# Patient Record
Sex: Female | Born: 1945
Health system: Southern US, Community
[De-identification: ages and names within clinical notes are randomized; demographics above are authoritative.]

## PROBLEM LIST (undated history)

## (undated) DIAGNOSIS — C801 Malignant (primary) neoplasm, unspecified: Secondary | ICD-10-CM

## (undated) DIAGNOSIS — K219 Gastro-esophageal reflux disease without esophagitis: Secondary | ICD-10-CM

## (undated) DIAGNOSIS — Z6379 Other stressful life events affecting family and household: Secondary | ICD-10-CM

## (undated) DIAGNOSIS — T7840XA Allergy, unspecified, initial encounter: Secondary | ICD-10-CM

## (undated) DIAGNOSIS — K635 Polyp of colon: Secondary | ICD-10-CM

## (undated) DIAGNOSIS — M8580 Other specified disorders of bone density and structure, unspecified site: Secondary | ICD-10-CM

## (undated) DIAGNOSIS — J45909 Unspecified asthma, uncomplicated: Secondary | ICD-10-CM

## (undated) DIAGNOSIS — E785 Hyperlipidemia, unspecified: Secondary | ICD-10-CM

## (undated) DIAGNOSIS — M1990 Unspecified osteoarthritis, unspecified site: Secondary | ICD-10-CM

## (undated) HISTORY — PX: MOHS SURGERY: SUR867

## (undated) HISTORY — DX: Unspecified asthma, uncomplicated: J45.909

## (undated) HISTORY — DX: Polyp of colon: K63.5

## (undated) HISTORY — DX: Unspecified osteoarthritis, unspecified site: M19.90

## (undated) HISTORY — DX: Other specified disorders of bone density and structure, unspecified site: M85.80

## (undated) HISTORY — DX: Gastro-esophageal reflux disease without esophagitis: K21.9

## (undated) HISTORY — DX: Malignant (primary) neoplasm, unspecified: C80.1

## (undated) HISTORY — PX: VAGINAL HYSTERECTOMY: SUR661

## (undated) HISTORY — DX: Hyperlipidemia, unspecified: E78.5

## (undated) HISTORY — PX: COLONOSCOPY: SHX174

## (undated) HISTORY — PX: BREAST BIOPSY: SHX20

## (undated) HISTORY — DX: Allergy, unspecified, initial encounter: T78.40XA

## (undated) HISTORY — PX: TONSILLECTOMY: SUR1361

## (undated) MED FILL — Fosaprepitant Dimeglumine For IV Infusion 150 MG (Base Eq): INTRAVENOUS | Qty: 5 | Status: AC

---
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
## 2004-03-04 ENCOUNTER — Ambulatory Visit: Payer: Self-pay | Admitting: Family Medicine

## 2005-03-24 ENCOUNTER — Ambulatory Visit: Payer: Self-pay | Admitting: Family Medicine

## 2005-04-05 ENCOUNTER — Ambulatory Visit: Payer: Self-pay | Admitting: Family Medicine

## 2006-02-01 ENCOUNTER — Ambulatory Visit: Payer: Self-pay | Admitting: Internal Medicine

## 2006-02-27 ENCOUNTER — Ambulatory Visit: Payer: Self-pay | Admitting: Internal Medicine

## 2006-05-08 ENCOUNTER — Ambulatory Visit: Payer: Self-pay

## 2006-07-13 ENCOUNTER — Ambulatory Visit: Payer: Self-pay | Admitting: Internal Medicine

## 2006-07-27 ENCOUNTER — Encounter: Payer: Self-pay | Admitting: Internal Medicine

## 2006-07-27 ENCOUNTER — Ambulatory Visit: Payer: Self-pay | Admitting: Internal Medicine

## 2006-11-08 DIAGNOSIS — D229 Melanocytic nevi, unspecified: Secondary | ICD-10-CM

## 2006-11-08 HISTORY — DX: Melanocytic nevi, unspecified: D22.9

## 2007-05-09 ENCOUNTER — Ambulatory Visit: Payer: Self-pay | Admitting: Family Medicine

## 2007-05-13 ENCOUNTER — Ambulatory Visit: Payer: Self-pay | Admitting: Family Medicine

## 2008-02-03 ENCOUNTER — Ambulatory Visit: Payer: Self-pay | Admitting: General Surgery

## 2009-02-03 ENCOUNTER — Ambulatory Visit: Payer: Self-pay | Admitting: Family Medicine

## 2009-10-13 ENCOUNTER — Ambulatory Visit: Payer: Self-pay | Admitting: Family Medicine

## 2010-02-04 ENCOUNTER — Ambulatory Visit: Payer: Self-pay | Admitting: Family Medicine

## 2010-07-01 NOTE — Assessment & Plan Note (Signed)
[facility]                         GASTROENTEROLOGY OFFICE NOTE   DONNALYNN, [name]                         MRN:          [id number]  DATE:02/27/2006                            DOB:          08/15/1945    [name] is a very nice 65 year old patient of Dr. Thana Ates who is  here today to discuss colorectal screening.  She had a colonoscopy  approximately 5 years ago by Dr. Thurmond Butts in [place], and was found to  have polyps.  The records are with Dr. Thana Ates.  Dr. Thurmond Butts has left.  She was not told when she needs to come back for recall colonoscopy.  She has chronic constipation, taking stool softeners on a daily basis.  She is having 1 bowel movement daily.  There has been no rectal  bleeding.  There is a family history of colon polyps in both her father  and mother, and her father's brother had colon cancer.  The patient has  a complaint of occasional heartburn and gastroesophageal reflux.   MEDICATIONS:  1. TriCor  145 mg daily.  2. Fosamax 70 mg p.o. daily.  3. Zolpidem 10 mg p.o. daily.  4. Aspirin 81 mg p.o. daily.  5. Calcium supplements.  6. Multiple vitamins.  7. Vitamin C.  8. Omega 3.  9. Glucosamine.   PAST MEDICAL HISTORY:  Significant for asthmatic bronchitis, chronic  headaches.   OPERATIONS:  Hysterectomy in 1983.   FAMILY HISTORY:  Positive for colon polyps.   SOCIAL HISTORY:  Married with 2 children, college education.  Works as a  Investment banker, corporate.  The patient does not smoke, does not drink alcohol.   REVIEW OF SYSTEMS:  Positive for sleeping problems.   PHYSICAL EXAMINATION:  Blood pressure 122/82, pulse 64, weight 176  pounds.  She was alert, oriented, in no distress.  HEENT:  Sclerae is nonicteric.  NECK:  Supple, no lymphadenopathy.  LUNGS:  Clear to auscultation.  COR:  Normal S1, normal S2.  ABDOMEN:  Soft, nontender with normoactive bowel sounds, no scars.  Liver edge at the costal margin.  RECTAL:  Normal  perianal area, rectal tone was normal, stool was brown,  Hemoccult-negative.   IMPRESSION:  [age]-year-old white female with personal history of colon  polyps and family history of colon polyps as well.  She has chronic  constipation and she is due for colonoscopy.  This is what decided about  the recall interval for her colonoscopy, which was done 5 years ago.  We  will obtain records from Dr. Thana Ates from Dr. Warden Fillers colonoscopy.  If  she had hyperplastic polyps she could wait 7 years for recall.  If she  had adenomatous polyps, then it is time for her to have a colonoscopy  now.  Since her husband is an Airline pilot she would like to wait until  after tax season to have her colonoscopy done.  She would prefer to have  the OsmoPrep rather than Miralax.  We have spent some time talking about  colonoscopy, conscious sedation, the procedure itself and the prep.  We  will schedule after reviewing her  old colonoscopy report from Dr.  Thana Ates.     Hedwig Morton. Juanda Chance, MD  Electronically Signed    DMB/MedQ  DD: 02/27/2006  DT: 02/28/2006  Job #: [id number]   cc:   Dennison Mascot

## 2011-09-29 ENCOUNTER — Ambulatory Visit: Payer: Self-pay | Admitting: Family Medicine

## 2011-11-16 ENCOUNTER — Encounter: Payer: Self-pay | Admitting: Internal Medicine

## 2011-11-28 ENCOUNTER — Encounter: Payer: Self-pay | Admitting: Internal Medicine

## 2012-08-27 DIAGNOSIS — C4491 Basal cell carcinoma of skin, unspecified: Secondary | ICD-10-CM

## 2012-08-27 HISTORY — DX: Basal cell carcinoma of skin, unspecified: C44.91

## 2012-12-10 ENCOUNTER — Ambulatory Visit: Payer: Self-pay | Admitting: Family Medicine

## 2012-12-13 ENCOUNTER — Ambulatory Visit: Payer: Self-pay | Admitting: Family Medicine

## 2013-05-07 ENCOUNTER — Encounter: Payer: Self-pay | Admitting: Internal Medicine

## 2013-06-03 ENCOUNTER — Ambulatory Visit: Payer: Self-pay | Admitting: Family Medicine

## 2013-07-02 ENCOUNTER — Encounter: Payer: Self-pay | Admitting: Internal Medicine

## 2013-08-13 ENCOUNTER — Ambulatory Visit (AMBULATORY_SURGERY_CENTER): Payer: Commercial Managed Care - HMO | Admitting: *Deleted

## 2013-08-13 VITALS — Ht 64.5 in | Wt 181.2 lb

## 2013-08-13 DIAGNOSIS — Z86010 Personal history of colonic polyps: Secondary | ICD-10-CM

## 2013-08-13 MED ORDER — MOVIPREP 100 G PO SOLR: ORAL | Status: DC

## 2013-08-13 NOTE — Progress Notes (Signed)
No egg or soy allergy  No anesthesia or intubation problems per pt  No diet medications taken  Registered in EMMI   

## 2013-08-14 ENCOUNTER — Encounter: Payer: Self-pay | Admitting: Internal Medicine

## 2013-08-27 ENCOUNTER — Ambulatory Visit (AMBULATORY_SURGERY_CENTER): Payer: Commercial Managed Care - HMO | Admitting: Internal Medicine

## 2013-08-27 ENCOUNTER — Encounter: Payer: Self-pay | Admitting: Internal Medicine

## 2013-08-27 VITALS — BP 138/74 | HR 49 | Temp 98.1°F | Resp 18 | Ht 64.5 in | Wt 181.0 lb

## 2013-08-27 DIAGNOSIS — Z86010 Personal history of colonic polyps: Secondary | ICD-10-CM

## 2013-08-27 DIAGNOSIS — Z8371 Family history of colonic polyps: Secondary | ICD-10-CM

## 2013-08-27 DIAGNOSIS — Z1211 Encounter for screening for malignant neoplasm of colon: Secondary | ICD-10-CM

## 2013-08-27 DIAGNOSIS — D126 Benign neoplasm of colon, unspecified: Secondary | ICD-10-CM

## 2013-08-27 MED ORDER — SODIUM CHLORIDE 0.9 % IV SOLN: 500.0000 mL | INTRAVENOUS | Status: DC

## 2013-08-27 NOTE — Progress Notes (Signed)
Called to room to assist during endoscopic procedure.  Patient ID and intended procedure confirmed with present staff. Received instructions for my participation in the procedure from the performing physician.  

## 2013-08-27 NOTE — Patient Instructions (Signed)
YOU HAD AN ENDOSCOPIC PROCEDURE TODAY AT THE [facility]: Refer to the procedure report that was given to you for any specific questions about what was found during the examination.  If the procedure report does not answer your questions, please call your gastroenterologist to clarify.  If you requested that your care partner not be given the details of your procedure findings, then the procedure report has been included in a sealed envelope for you to review at your convenience later.  YOU SHOULD EXPECT: Some feelings of bloating in the abdomen. Passage of more gas than usual.  Walking can help get rid of the air that was put into your GI tract during the procedure and reduce the bloating. If you had a lower endoscopy (such as a colonoscopy or flexible sigmoidoscopy) you may notice spotting of blood in your stool or on the toilet paper. If you underwent a bowel prep for your procedure, then you may not have a normal bowel movement for a few days.  DIET: Your first meal following the procedure should be a light meal and then it is ok to progress to your normal diet.  A half-sandwich or bowl of soup is an example of a good first meal.  Heavy or fried foods are harder to digest and may make you feel nauseous or bloated.  Likewise meals heavy in dairy and vegetables can cause extra gas to form and this can also increase the bloating.  Drink plenty of fluids but you should avoid alcoholic beverages for 24 hours.  ACTIVITY: Your care partner should take you home directly after the procedure.  You should plan to take it easy, moving slowly for the rest of the day.  You can resume normal activity the day after the procedure however you should NOT DRIVE or use heavy machinery for 24 hours (because of the sedation medicines used during the test).    SYMPTOMS TO REPORT IMMEDIATELY: A gastroenterologist can be reached at any hour.  During normal business hours, 8:30 AM to 5:00 PM Monday through Friday,  call [phone].  After hours and on weekends, please call the GI answering service at [phone] who will take a message and have the physician on call contact you.   Following lower endoscopy (colonoscopy or flexible sigmoidoscopy):  Excessive amounts of blood in the stool  Significant tenderness or worsening of abdominal pains  Swelling of the abdomen that is new, acute  Fever of 100F or higher  FOLLOW UP: If any biopsies were taken you will be contacted by phone or by letter within the next 1-3 weeks.  Call your gastroenterologist if you have not heard about the biopsies in 3 weeks.  Our staff will call the home number listed on your records the next business day following your procedure to check on you and address any questions or concerns that you may have at that time regarding the information given to you following your procedure. This is a courtesy call and so if there is no answer at the home number and we have not heard from you through the emergency physician on call, we will assume that you have returned to your regular daily activities without incident.  SIGNATURES/CONFIDENTIALITY: You and/or your care partner have signed paperwork which will be entered into your electronic medical record.  These signatures attest to the fact that that the information above on your After Visit Summary has been reviewed and is understood.  Full responsibility of the confidentiality of this   discharge information lies with you and/or your care-partner.  Polyps-handout given  Repeat colonoscopy will be determined by pathology.  Hold aspirin, aspirin products and anti-inflammatory medications (ibuprofen, motrin, advil, naproxen, aleve,etc) for two weeks. Until 09/10/13

## 2013-08-27 NOTE — Progress Notes (Signed)
A/ox3, pleased with MAC, report to RN 

## 2013-08-27 NOTE — Op Note (Signed)
[facility]  [address]. [place], [address]   COLONOSCOPY PROCEDURE REPORT  PATIENT: Elizabeth, [name]  MR#: [id number] BIRTHDATE: Nov 04, 1945 , 34  yrs. old GENDER: Female ENDOSCOPIST: Lafayette Dragon, MD REFERRED HU:DJSHFW Rutherford Nail, M.D. PROCEDURE DATE:  08/27/2013 PROCEDURE:   Colonoscopy with snare polypectomy and Colonoscopy with cold biopsy polypectomy First Screening Colonoscopy - Avg.  risk and is 50 yrs.  old or older - No.  Prior Negative Screening - Now for repeat screening. N/A  History of Adenoma - Now for follow-up colonoscopy & has been > or = to 3 yrs.  N/A  Polyps Removed Today? Yes. ASA CLASS:   Class II INDICATIONS:story of colon polyps in 2003,, Hyperplastic polyp removed in June 2008.  Positive family history of colon polyps. MEDICATIONS: MAC sedation, administered by CRNA and propofol (Diprivan) 200mg  IV  DESCRIPTION OF PROCEDURE:   After the risks benefits and alternatives of the procedure were thoroughly explained, informed consent was obtained.  A digital rectal exam revealed no abnormalities of the rectum.   The LB [id number]  endoscope was introduced through the anus and advanced to the cecum, which was identified by both the appendix and ileocecal valve. No adverse events experienced.   The quality of the prep was good, using MoviPrep  The instrument was then slowly withdrawn as the colon was fully examined.      COLON FINDINGS: Three sessile polyps ranging between 3-26mm in size were found in the ascending colon and descending colon.  A polypectomy was performed with cold forceps and with a cold snare. The resection was complete and the polyp tissue was completely retrieved.  Retroflexed views revealed no abnormalities. The time to cecum=12 minutes 44 seconds.  Withdrawal time=6 minutes 07 seconds.  The scope was withdrawn and the procedure completed. COMPLICATIONS: There were no complications.  ENDOSCOPIC  IMPRESSION: Three sessile polyps ranging between 3-64mm in size were found in the ascending colon x1 and descending colon x2 polypectomy was performed with cold forceps x1 and with a cold snare x2  RECOMMENDATIONS: 1.  Await pathology results 2.  no aspirin or anti-inflammatory agents for 2 weeks High-fiber diet Recall colonoscopy pending path report   eSigned:  Lafayette Dragon, MD 08/27/2013 11:34 AM   cc:   PATIENT NAME:  [name], [name] MR#: [id number]

## 2013-08-28 ENCOUNTER — Telehealth: Payer: Self-pay | Admitting: *Deleted

## 2013-08-28 NOTE — Telephone Encounter (Signed)
  Follow up Call-  Call back number 08/27/2013  Post procedure Call Back phone  # [phone]  Permission to leave phone message Yes     Patient questions:  Do you have a fever, pain , or abdominal swelling? No. Pain Score  0 *  Have you tolerated food without any problems? Yes.    Have you been able to return to your normal activities? Yes.    Do you have any questions about your discharge instructions: Diet   No. Medications  No. Follow up visit  No.  Do you have questions or concerns about your Care? No.  Actions: * If pain score is 4 or above: No action needed, pain <4.   Follow up Call-  Call back number 08/27/2013  Post procedure Call Back phone  # [phone]  Permission to leave phone message Yes     Patient questions:  Do you have a fever, pain , or abdominal swelling? No. Pain Score  0 *  Have you tolerated food without any problems? Yes.    Have you been able to return to your normal activities? Yes.    Do you have any questions about your discharge instructions: Diet   No. Medications  No. Follow up visit  No.  Do you have questions or concerns about your Care? No.  Actions: * If pain score is 4 or above: No action needed, pain <4.

## 2013-09-01 ENCOUNTER — Encounter: Payer: Self-pay | Admitting: Internal Medicine

## 2014-01-05 ENCOUNTER — Ambulatory Visit: Payer: Self-pay | Admitting: Family Medicine

## 2014-12-02 ENCOUNTER — Ambulatory Visit: Payer: Self-pay | Admitting: Family Medicine

## 2014-12-09 ENCOUNTER — Ambulatory Visit (INDEPENDENT_AMBULATORY_CARE_PROVIDER_SITE_OTHER): Payer: Commercial Managed Care - HMO | Admitting: Family Medicine

## 2014-12-09 ENCOUNTER — Encounter: Payer: Self-pay | Admitting: Family Medicine

## 2014-12-09 ENCOUNTER — Encounter (INDEPENDENT_AMBULATORY_CARE_PROVIDER_SITE_OTHER): Payer: Self-pay

## 2014-12-09 DIAGNOSIS — G4700 Insomnia, unspecified: Secondary | ICD-10-CM | POA: Diagnosis not present

## 2014-12-09 DIAGNOSIS — R109 Unspecified abdominal pain: Secondary | ICD-10-CM | POA: Diagnosis not present

## 2014-12-09 DIAGNOSIS — F411 Generalized anxiety disorder: Secondary | ICD-10-CM | POA: Insufficient documentation

## 2014-12-09 DIAGNOSIS — E785 Hyperlipidemia, unspecified: Secondary | ICD-10-CM | POA: Diagnosis not present

## 2014-12-09 DIAGNOSIS — F419 Anxiety disorder, unspecified: Secondary | ICD-10-CM | POA: Diagnosis not present

## 2014-12-09 DIAGNOSIS — F5102 Adjustment insomnia: Secondary | ICD-10-CM | POA: Insufficient documentation

## 2014-12-09 DIAGNOSIS — E782 Mixed hyperlipidemia: Secondary | ICD-10-CM | POA: Insufficient documentation

## 2014-12-09 DIAGNOSIS — F430 Acute stress reaction: Secondary | ICD-10-CM

## 2014-12-09 MED ORDER — ONDANSETRON HCL 4 MG PO TABS: 4.0000 mg | ORAL_TABLET | Freq: Three times a day (TID) | ORAL | Status: DC | PRN

## 2014-12-09 MED ORDER — PANTOPRAZOLE SODIUM 40 MG PO TBEC: 40.0000 mg | DELAYED_RELEASE_TABLET | Freq: Every day | ORAL | Status: DC

## 2014-12-09 MED ORDER — ZOLPIDEM TARTRATE 5 MG PO TABS: 5.0000 mg | ORAL_TABLET | Freq: Every evening | ORAL | Status: DC | PRN

## 2014-12-09 MED ORDER — LOVASTATIN 20 MG PO TABS: 20.0000 mg | ORAL_TABLET | Freq: Every day | ORAL | Status: DC

## 2014-12-09 NOTE — Progress Notes (Signed)
Name: [name]   MRN: [id number]    DOB: [birth year]   Date:12/09/2014       Progress Note  Subjective  Chief Complaint  Chief Complaint  Patient presents with  . Hyperlipidemia    6 month follow up  . Insomnia  . Abdominal Pain    on and off for 3 months with nausea    HPI   Hyperlipidemia  Patient has a history of hyperlipidemia for over 5 years.  Current medical regimen consist of lovastatin 20 mg daily at bedtime .  Compliance is good .  Diet and exercise are currently followed moderately well .  Risk factors for cardiovascular disease include hyperlipidemia  .  and age.  There have been no side effects from the medication.    Insomnia history of present illness  Insomnia has been a perennial problem. Ambien 5 mg daily daily at bedtime which are quite effective   Abdominal pain with nausea  For the past 6 months the patient has had abdominal discomfort with nausea without vomiting usually occurs in the morning and has done by lunchtime. There is no particular offending food. There's been no actual emesis or hematemesis and no melena or hematochezia. Colonoscopy was done just a couple 3 years. This been no significant weight loss. This been no icterus and no acholic stools. She still has intact gallbladder  Patient presents with a 3 month history of intermittent abdominal pain and nausea.  Anxiety  Patient has intermittent problem anxiety traveling lorazepam on a when necessary basis with improvement. Symptoms persisted nausea disease) panic attacks and mild-to-moderate resides  Past Medical History  Diagnosis Date  . Allergy   . Arthritis   . Asthma   . Cancer (HCC)     shoulder, nose skin CA- basal cell CA  . GERD (gastroesophageal reflux disease)     occasional  . Hyperlipidemia     Social History  Substance Use Topics  . Smoking status: Former Research scientist (life sciences)  . Smokeless tobacco: Never Used  . Alcohol Use: Yes     Comment: 1 drink monthly     Current  outpatient prescriptions:  .  lovastatin (MEVACOR) 20 MG tablet, Take 1 tablet (20 mg total) by mouth at bedtime., Disp: 90 tablet, Rfl: 1 .  meloxicam (MOBIC) 7.5 MG tablet, Take 7.5 mg by mouth daily., Disp: , Rfl:  .  zolpidem (AMBIEN) 5 MG tablet, Take 1 tablet (5 mg total) by mouth at bedtime as needed for sleep., Disp: 90 tablet, Rfl: 1  Allergies  Allergen Reactions  . Tetanus Toxoids Shortness Of Breath  . Sulfa Antibiotics     Severe vomiting    Review of Systems  Constitutional: Negative for fever, chills and weight loss.  HENT: Negative for congestion, hearing loss, sore throat and tinnitus.   Eyes: Negative for blurred vision, double vision and redness.  Respiratory: Negative for cough, hemoptysis and shortness of breath.   Cardiovascular: Negative for chest pain, palpitations, orthopnea, claudication and leg swelling.  Gastrointestinal: Positive for heartburn, nausea and abdominal pain. Negative for vomiting, diarrhea, constipation and blood in stool.  Genitourinary: Negative for dysuria, urgency, frequency and hematuria.  Musculoskeletal: Negative for myalgias, back pain, joint pain, falls and neck pain.  Skin: Negative for itching.  Neurological: Negative for dizziness, tingling, tremors, focal weakness, seizures, loss of consciousness, weakness and headaches.  Endo/Heme/Allergies: Does not bruise/bleed easily.  Psychiatric/Behavioral: Negative for depression and substance abuse. The patient is nervous/anxious and has insomnia.  Objective  There were no vitals filed for this visit.   Physical Exam  Constitutional: She is oriented to person, place, and time and well-developed, well-nourished, and in no distress.  HENT:  Head: Normocephalic.  Eyes: EOM are normal. Pupils are equal, round, and reactive to light.  Neck: Normal range of motion. No thyromegaly present.  Cardiovascular: Normal rate, regular rhythm and normal heart sounds.   No murmur  heard. Pulmonary/Chest: Effort normal and breath sounds normal.  Abdominal: Soft. Bowel sounds are normal. She exhibits no distension and no mass. There is no tenderness. There is no rebound and no guarding.  Musculoskeletal: Normal range of motion. She exhibits no edema.  Neurological: She is alert and oriented to person, place, and time. No cranial nerve deficit. Gait normal.  Skin: Skin is warm and dry. No rash noted.  Psychiatric: Memory and affect normal.      Assessment & Plan  1. Hyperlipidemia Labs - Comprehensive Metabolic Panel (CMET) - Lipid panel - TSH  2. Abdominal pain, unspecified abdominal location Differential includes gastric or duodenal ulcer or early gallstone disease - US Abdomen Complete; Future  3. Insomnia Ongoing well to Ambien  4. Acute anxiety Benzodiazepine when necessary - Comprehensive Metabolic Panel (CMET)

## 2014-12-12 LAB — LIPID PANEL
CHOL/HDL RATIO: 3.5 ratio (ref 0.0–4.4)
Cholesterol, Total: 181 mg/dL (ref 100–199)
HDL: 51 mg/dL (ref >39)
LDL CALC: 88 mg/dL (ref 0–99)
Triglycerides: 210 mg/dL — ABNORMAL HIGH (ref 0–149)
VLDL Cholesterol Cal: 42 mg/dL — ABNORMAL HIGH (ref 5–40)

## 2014-12-12 LAB — COMPREHENSIVE METABOLIC PANEL
ALT: 14 IU/L (ref 0–32)
AST: 17 IU/L (ref 0–40)
Albumin/Globulin Ratio: 2.2 (ref 1.1–2.5)
Albumin: 4.1 g/dL (ref 3.6–4.8)
Alkaline Phosphatase: 71 IU/L (ref 39–117)
BUN/Creatinine Ratio: 12 (ref 11–26)
BUN: 11 mg/dL (ref 8–27)
Bilirubin Total: 0.4 mg/dL (ref 0.0–1.2)
CO2: 26 mmol/L (ref 18–29)
Calcium: 8.9 mg/dL (ref 8.7–10.3)
Chloride: 101 mmol/L (ref 97–106)
Creatinine, Ser: 0.9 mg/dL (ref 0.57–1.00)
GFR, EST AFRICAN AMERICAN: 75 mL/min/{1.73_m2} (ref >59)
GFR, EST NON AFRICAN AMERICAN: 65 mL/min/{1.73_m2} (ref >59)
GLUCOSE: 101 mg/dL — AB (ref 65–99)
Globulin, Total: 1.9 g/dL (ref 1.5–4.5)
Potassium: 4.3 mmol/L (ref 3.5–5.2)
Sodium: 144 mmol/L (ref 136–144)
TOTAL PROTEIN: 6 g/dL (ref 6.0–8.5)

## 2014-12-12 LAB — TSH: TSH: 1.04 u[IU]/mL (ref 0.450–4.500)

## 2014-12-17 ENCOUNTER — Encounter: Payer: Self-pay | Admitting: Family Medicine

## 2014-12-28 ENCOUNTER — Telehealth: Payer: Self-pay | Admitting: Emergency Medicine

## 2014-12-28 ENCOUNTER — Ambulatory Visit
Admission: RE | Admit: 2014-12-28 | Discharge: 2014-12-28 | Disposition: A | Discharge status: Home / Self Care | Payer: Commercial Managed Care - HMO | Source: Ambulatory Visit | Attending: Family Medicine | Admitting: Family Medicine

## 2014-12-28 DIAGNOSIS — R109 Unspecified abdominal pain: Secondary | ICD-10-CM | POA: Diagnosis present

## 2014-12-28 NOTE — Telephone Encounter (Signed)
Patient notified of labs.   

## 2014-12-28 NOTE — Telephone Encounter (Signed)
Patient notified of US results

## 2015-05-12 DIAGNOSIS — I8393 Asymptomatic varicose veins of bilateral lower extremities: Secondary | ICD-10-CM | POA: Diagnosis not present

## 2015-05-12 DIAGNOSIS — D485 Neoplasm of uncertain behavior of skin: Secondary | ICD-10-CM | POA: Diagnosis not present

## 2015-05-12 DIAGNOSIS — T07 Unspecified multiple injuries: Secondary | ICD-10-CM | POA: Diagnosis not present

## 2015-05-12 DIAGNOSIS — L821 Other seborrheic keratosis: Secondary | ICD-10-CM | POA: Diagnosis not present

## 2015-05-12 DIAGNOSIS — D1800 Hemangioma unspecified site: Secondary | ICD-10-CM | POA: Diagnosis not present

## 2015-05-12 DIAGNOSIS — Z85828 Personal history of other malignant neoplasm of skin: Secondary | ICD-10-CM | POA: Diagnosis not present

## 2015-05-12 DIAGNOSIS — D229 Melanocytic nevi, unspecified: Secondary | ICD-10-CM | POA: Diagnosis not present

## 2015-05-12 DIAGNOSIS — L578 Other skin changes due to chronic exposure to nonionizing radiation: Secondary | ICD-10-CM | POA: Diagnosis not present

## 2015-05-12 DIAGNOSIS — L812 Freckles: Secondary | ICD-10-CM | POA: Diagnosis not present

## 2015-05-12 DIAGNOSIS — Z1283 Encounter for screening for malignant neoplasm of skin: Secondary | ICD-10-CM | POA: Diagnosis not present

## 2015-06-02 ENCOUNTER — Ambulatory Visit (INDEPENDENT_AMBULATORY_CARE_PROVIDER_SITE_OTHER): Payer: PPO | Admitting: Family Medicine

## 2015-06-02 ENCOUNTER — Encounter: Payer: Self-pay | Admitting: Family Medicine

## 2015-06-02 VITALS — BP 118/64 | HR 76 | Temp 97.9°F | Resp 18 | Ht 64.0 in | Wt 175.0 lb

## 2015-06-02 DIAGNOSIS — N3001 Acute cystitis with hematuria: Secondary | ICD-10-CM | POA: Diagnosis not present

## 2015-06-02 MED ORDER — NITROFURANTOIN MONOHYD MACRO 100 MG PO CAPS: 100.0000 mg | ORAL_CAPSULE | Freq: Two times a day (BID) | ORAL | Status: DC

## 2015-06-02 NOTE — Progress Notes (Signed)
Name: [name]   MRN: [id number]    DOB: October 02, 1945   Date:06/02/2015       Progress Note  Subjective  Chief Complaint  Chief Complaint  Patient presents with  . Hematuria    for 1 day  . Urinary Tract Infection    Hematuria This is a new problem. The current episode started yesterday. She describes the hematuria as gross hematuria. She reports clotting at the end of her urine stream. Irritative symptoms include frequency and urgency. Obstructive symptoms include incomplete emptying. Associated symptoms include dysuria. Pertinent negatives include no chills, flank pain or nausea.  Urinary Tract Infection  This is a new problem. The current episode started yesterday. There has been no fever. Associated symptoms include frequency, hematuria and urgency. Pertinent negatives include no chills, flank pain or nausea. She has tried increased fluids (Cranberry Juice.) for the symptoms.     Past Medical History  Diagnosis Date  . Allergy   . Arthritis   . Asthma   . Cancer (HCC)     shoulder, nose skin CA- basal cell CA  . GERD (gastroesophageal reflux disease)     occasional  . Hyperlipidemia     Past Surgical History  Procedure Laterality Date  . Mohs surgery    . Colonoscopy    . Vaginal hysterectomy    . Tonsillectomy      Family History  Problem Relation Age of Onset  . Colon cancer Paternal Uncle   . Rectal cancer Paternal Uncle   . Esophageal cancer Neg Hx   . Stomach cancer Neg Hx     Social History   Social History  . Marital Status: Married    Spouse Name: N/A  . Number of Children: N/A  . Years of Education: N/A   Occupational History  . Not on file.   Social History Main Topics  . Smoking status: Former Research scientist (life sciences)  . Smokeless tobacco: Never Used  . Alcohol Use: Yes     Comment: 1 drink monthly  . Drug Use: No  . Sexual Activity: Not on file   Other Topics Concern  . Not on file   Social History Narrative     Current outpatient  prescriptions:  .  lovastatin (MEVACOR) 20 MG tablet, Take 1 tablet (20 mg total) by mouth at bedtime., Disp: 90 tablet, Rfl: 1 .  meloxicam (MOBIC) 7.5 MG tablet, Take 7.5 mg by mouth daily., Disp: , Rfl:  .  ondansetron (ZOFRAN) 4 MG tablet, Take 1 tablet (4 mg total) by mouth every 8 (eight) hours as needed for nausea or vomiting., Disp: 20 tablet, Rfl: 0 .  pantoprazole (PROTONIX) 40 MG tablet, Take 1 tablet (40 mg total) by mouth daily., Disp: 30 tablet, Rfl: 3 .  zolpidem (AMBIEN) 5 MG tablet, Take 1 tablet (5 mg total) by mouth at bedtime as needed for sleep., Disp: 90 tablet, Rfl: 1  Allergies  Allergen Reactions  . Tetanus Toxoids Shortness Of Breath  . Sulfa Antibiotics     Severe vomiting     Review of Systems  Constitutional: Negative for chills.  Gastrointestinal: Negative for nausea.  Genitourinary: Positive for dysuria, urgency, frequency, hematuria and incomplete emptying. Negative for flank pain.     Objective  Filed Vitals:   06/02/15 1508  BP: 118/64  Pulse: 76  Temp: 97.9 F (36.6 C)  Resp: 18  Height: 5\' 4"  (1.626 m)  Weight: 175 lb (79.379 kg)  SpO2: 98%    Physical Exam  Constitutional: She is well-developed, well-nourished, and in no distress.  Cardiovascular: Normal rate and regular rhythm.   Pulmonary/Chest: Effort normal and breath sounds normal.  Abdominal: Soft. Bowel sounds are normal. There is tenderness in the left lower quadrant. There is CVA tenderness (left CVA Tenderness.).  Nursing note and vitals reviewed.      Assessment & Plan  1. Acute cystitis with hematuria We'll start on Macrobid, obtained urinalysis and culture and adjust accordingly. - Urine Culture - Urinalysis, Routine w reflex microscopic - nitrofurantoin, macrocrystal-monohydrate, (MACROBID) 100 MG capsule; Take 1 capsule (100 mg total) by mouth 2 (two) times daily.  Dispense: 14 capsule; Refill: 0   Keiasia Christianson [facility] 06/02/2015 3:34 PM

## 2015-06-03 DIAGNOSIS — N3001 Acute cystitis with hematuria: Secondary | ICD-10-CM | POA: Diagnosis not present

## 2015-06-04 LAB — MICROSCOPIC EXAMINATION
Casts: NONE SEEN /lpf
WBC, UA: >30 /hpf — AB (ref 0–?)

## 2015-06-04 LAB — URINALYSIS, ROUTINE W REFLEX MICROSCOPIC
BILIRUBIN UA: NEGATIVE
GLUCOSE, UA: NEGATIVE
KETONES UA: NEGATIVE
NITRITE UA: NEGATIVE
SPEC GRAV UA: 1.011 (ref 1.005–1.030)
Urobilinogen, Ur: 0.2 mg/dL (ref 0.2–1.0)
pH, UA: 6.5 (ref 5.0–7.5)

## 2015-06-05 LAB — URINE CULTURE

## 2015-06-08 DIAGNOSIS — H2513 Age-related nuclear cataract, bilateral: Secondary | ICD-10-CM | POA: Diagnosis not present

## 2015-06-10 ENCOUNTER — Ambulatory Visit: Payer: Commercial Managed Care - HMO | Admitting: Family Medicine

## 2015-06-14 ENCOUNTER — Ambulatory Visit: Payer: Commercial Managed Care - HMO | Admitting: Family Medicine

## 2015-07-05 ENCOUNTER — Encounter: Payer: Self-pay | Admitting: Family Medicine

## 2015-07-05 ENCOUNTER — Ambulatory Visit (INDEPENDENT_AMBULATORY_CARE_PROVIDER_SITE_OTHER): Payer: PPO | Admitting: Family Medicine

## 2015-07-05 VITALS — BP 118/69 | HR 77 | Temp 98.0°F | Resp 15 | Ht 64.0 in | Wt 175.1 lb

## 2015-07-05 DIAGNOSIS — E785 Hyperlipidemia, unspecified: Secondary | ICD-10-CM

## 2015-07-05 DIAGNOSIS — F419 Anxiety disorder, unspecified: Secondary | ICD-10-CM | POA: Diagnosis not present

## 2015-07-05 DIAGNOSIS — G4700 Insomnia, unspecified: Secondary | ICD-10-CM

## 2015-07-05 DIAGNOSIS — F430 Acute stress reaction: Secondary | ICD-10-CM

## 2015-07-05 DIAGNOSIS — F411 Generalized anxiety disorder: Secondary | ICD-10-CM

## 2015-07-05 MED ORDER — ZOLPIDEM TARTRATE 5 MG PO TABS: 5.0000 mg | ORAL_TABLET | Freq: Every evening | ORAL | Status: DC | PRN

## 2015-07-05 MED ORDER — LORAZEPAM 0.5 MG PO TABS: 0.5000 mg | ORAL_TABLET | Freq: Two times a day (BID) | ORAL | Status: DC | PRN

## 2015-07-05 NOTE — Progress Notes (Signed)
Name: [name]   MRN: [id number]    DOB: [birth year]   Date:07/05/2015       Progress Note  Subjective  Chief Complaint  Chief Complaint  Patient presents with  . Medication Refill    lorazepam 0.5 mg / lovastatin 20 mg / zolpidem 5 mg     Anxiety Presents for follow-up visit. The problem has been unchanged. Symptoms include excessive worry, insomnia and nervous/anxious behavior (usually from her husband driving their [id number] RV). The severity of symptoms is moderate.   Past treatments include benzodiazephines. The treatment provided significant relief. Compliance with prior treatments has been good.  Insomnia Primary symptoms: difficulty falling asleep.  Typical bedtime:  10-11 P.M. (between [phone]).  How long after going to bed to you fall asleep: 30-60 minutes.   PMH includes: no depression, family stress or anxiety.  Hyperlipidemia This is a chronic problem. The problem is uncontrolled. Recent lipid tests were reviewed and are high. Associated symptoms include myalgias (pain in joints.). Pertinent negatives include no leg pain. Current antihyperlipidemic treatment includes statins. There are no compliance problems.  Risk factors for coronary artery disease include stress (Plus history of mini stroke 45 years ago.).    Past Medical History  Diagnosis Date  . Allergy   . Arthritis   . Asthma   . Cancer (HCC)     shoulder, nose skin CA- basal cell CA  . GERD (gastroesophageal reflux disease)     occasional  . Hyperlipidemia     Past Surgical History  Procedure Laterality Date  . Mohs surgery    . Colonoscopy    . Vaginal hysterectomy    . Tonsillectomy      Family History  Problem Relation Age of Onset  . Colon cancer Paternal Uncle   . Rectal cancer Paternal Uncle   . Esophageal cancer Neg Hx   . Stomach cancer Neg Hx     Social History   Social History  . Marital Status: Married    Spouse Name: N/A  . Number of Children: N/A  . Years of Education:  N/A   Occupational History  . Not on file.   Social History Main Topics  . Smoking status: Former Research scientist (life sciences)  . Smokeless tobacco: Never Used  . Alcohol Use: Yes     Comment: 1 drink monthly  . Drug Use: No  . Sexual Activity: Not on file   Other Topics Concern  . Not on file   Social History Narrative     Current outpatient prescriptions:  .  lovastatin (MEVACOR) 20 MG tablet, Take 1 tablet (20 mg total) by mouth at bedtime., Disp: 90 tablet, Rfl: 1 .  meloxicam (MOBIC) 7.5 MG tablet, Take 7.5 mg by mouth daily., Disp: , Rfl:  .  ondansetron (ZOFRAN) 4 MG tablet, Take 1 tablet (4 mg total) by mouth every 8 (eight) hours as needed for nausea or vomiting., Disp: 20 tablet, Rfl: 0 .  pantoprazole (PROTONIX) 40 MG tablet, Take 1 tablet (40 mg total) by mouth daily., Disp: 30 tablet, Rfl: 3 .  zolpidem (AMBIEN) 5 MG tablet, Take 1 tablet (5 mg total) by mouth at bedtime as needed for sleep., Disp: 90 tablet, Rfl: 1 .  nitrofurantoin, macrocrystal-monohydrate, (MACROBID) 100 MG capsule, Take 1 capsule (100 mg total) by mouth 2 (two) times daily. (Patient not taking: Reported on 07/05/2015), Disp: 14 capsule, Rfl: 0  Allergies  Allergen Reactions  . Tetanus Toxoids Shortness Of Breath  . Sulfa Antibiotics  Severe vomiting     Review of Systems  Musculoskeletal: Positive for myalgias (pain in joints.).  Psychiatric/Behavioral: Negative for depression. The patient is nervous/anxious (usually from her husband driving their [id number] RV) and has insomnia.     Objective  Filed Vitals:   07/05/15 1028  BP: 118/69  Pulse: 77  Temp: 98 F (36.7 C)  TempSrc: Oral  Resp: 15  Height: 5\' 4"  (1.626 m)  Weight: 175 lb 1.6 oz (79.425 kg)  SpO2: 96%    Physical Exam  Constitutional: [name] is oriented to person, place, and time and well-developed, well-nourished, and in no distress.  HENT:  Head: Normocephalic and atraumatic.  Cardiovascular: Normal rate and regular rhythm.    Pulmonary/Chest: Effort normal and breath sounds normal.  Abdominal: Soft. Bowel sounds are normal.  Neurological: [name] is alert and oriented to person, place, and time.  Psychiatric: Mood, memory, affect and judgment normal.  Nursing note and vitals reviewed.    Assessment & Plan  1. Hyperlipidemia DC lovastatin, obtain FLP and consider starting on Livalo. - Lipid Profile - Comprehensive Metabolic Panel (CMET)  2. Anxiety as acute reaction to exceptional stress Increased stress with her son diagnosed with colon cancer, takes lorazepam as needed, refills provided - LORazepam (ATIVAN) 0.5 MG tablet; Take 1 tablet (0.5 mg total) by mouth 2 (two) times daily as needed for anxiety.  Dispense: 60 tablet; Refill: 2  3. Insomnia On Ambien taken nightly for sleep. - zolpidem (AMBIEN) 5 MG tablet; Take 1 tablet (5 mg total) by mouth at bedtime as needed for sleep.  Dispense: 90 tablet; Refill: 1   Blake Vetrano [facility] 07/05/2015 10:37 AM

## 2015-07-07 DIAGNOSIS — E785 Hyperlipidemia, unspecified: Secondary | ICD-10-CM | POA: Diagnosis not present

## 2015-07-08 LAB — LIPID PANEL
CHOLESTEROL TOTAL: 158 mg/dL (ref 100–199)
Chol/HDL Ratio: 3 ratio units (ref 0.0–4.4)
HDL: 53 mg/dL (ref >39)
LDL Calculated: 76 mg/dL (ref 0–99)
TRIGLYCERIDES: 147 mg/dL (ref 0–149)
VLDL CHOLESTEROL CAL: 29 mg/dL (ref 5–40)

## 2015-07-08 LAB — COMPREHENSIVE METABOLIC PANEL
A/G RATIO: 2.6 — AB (ref 1.2–2.2)
ALBUMIN: 4.1 g/dL (ref 3.5–4.8)
ALK PHOS: 68 IU/L (ref 39–117)
ALT: 19 IU/L (ref 0–32)
AST: 18 IU/L (ref 0–40)
BILIRUBIN TOTAL: 0.4 mg/dL (ref 0.0–1.2)
BUN / CREAT RATIO: 12 (ref 12–28)
BUN: 11 mg/dL (ref 8–27)
CHLORIDE: 105 mmol/L (ref 96–106)
CO2: 26 mmol/L (ref 18–29)
Calcium: 9.2 mg/dL (ref 8.7–10.3)
Creatinine, Ser: 0.9 mg/dL (ref 0.57–1.00)
GFR calc non Af Amer: 65 mL/min/{1.73_m2} (ref >59)
GFR, EST AFRICAN AMERICAN: 75 mL/min/{1.73_m2} (ref >59)
GLOBULIN, TOTAL: 1.6 g/dL (ref 1.5–4.5)
Glucose: 100 mg/dL — ABNORMAL HIGH (ref 65–99)
Potassium: 4.6 mmol/L (ref 3.5–5.2)
SODIUM: 145 mmol/L — AB (ref 134–144)
TOTAL PROTEIN: 5.7 g/dL — AB (ref 6.0–8.5)

## 2015-08-03 ENCOUNTER — Telehealth: Payer: Self-pay | Admitting: Family Medicine

## 2015-08-04 ENCOUNTER — Telehealth: Payer: Self-pay | Admitting: Family Medicine

## 2015-08-04 NOTE — Telephone Encounter (Signed)
[name] from [employer] sent over a prior authorization for Zolpidem. You could either return call or fax the form in.  Reference Number: [id number]

## 2015-08-11 NOTE — Telephone Encounter (Signed)
errenous °

## 2015-08-24 ENCOUNTER — Telehealth: Payer: Self-pay | Admitting: Family Medicine

## 2015-08-24 DIAGNOSIS — E785 Hyperlipidemia, unspecified: Secondary | ICD-10-CM

## 2015-08-24 MED ORDER — LOVASTATIN 20 MG PO TABS: 20.0000 mg | ORAL_TABLET | Freq: Every day | ORAL | Status: DC

## 2015-08-24 NOTE — Telephone Encounter (Signed)
Prescription for lovastatin is sent to her pharmacy

## 2015-08-24 NOTE — Telephone Encounter (Signed)
Pt needs refill on Lovastatin to be sent to [facility]. Pt requesting a 90 day supply

## 2015-08-25 NOTE — Telephone Encounter (Signed)
Returned call and left voicemail asking Tasha from [employer] to please return my call concerning prior authorization for Zolpidem.  Reference Number: [id number]

## 2016-01-10 ENCOUNTER — Ambulatory Visit (INDEPENDENT_AMBULATORY_CARE_PROVIDER_SITE_OTHER): Payer: PPO | Admitting: Family Medicine

## 2016-01-10 ENCOUNTER — Encounter: Payer: Self-pay | Admitting: Family Medicine

## 2016-01-10 DIAGNOSIS — G4700 Insomnia, unspecified: Secondary | ICD-10-CM

## 2016-01-10 MED ORDER — ZOLPIDEM TARTRATE 5 MG PO TABS: 5.0000 mg | ORAL_TABLET | Freq: Every evening | ORAL | 1 refills | Status: DC | PRN

## 2016-01-10 NOTE — Progress Notes (Signed)
Name: [name]   MRN: [id number]    DOB: 1946/01/24   Date:01/10/2016       Progress Note  Subjective  Chief Complaint  Chief Complaint  Patient presents with  . Follow-up    6 mo  . Medication Refill    ambien  . Referral    Mammogram / bone density    Insomnia  Primary symptoms: difficulty falling asleep.  The onset quality is gradual. The symptoms are aggravated by anxiety. Past treatments include medication. The treatment provided significant relief. Typical bedtime:  8-10 P.M..  How long after going to bed to you fall asleep: 30-60 minutes.   PMH includes: no hypertension.     Past Medical History:  Diagnosis Date  . Allergy   . Arthritis   . Asthma   . Cancer (HCC)    shoulder, nose skin CA- basal cell CA  . GERD (gastroesophageal reflux disease)    occasional  . Hyperlipidemia     Past Surgical History:  Procedure Laterality Date  . COLONOSCOPY    . MOHS SURGERY    . TONSILLECTOMY    . VAGINAL HYSTERECTOMY      Family History  Problem Relation Age of Onset  . Colon cancer Paternal Uncle   . Rectal cancer Paternal Uncle   . Esophageal cancer Neg Hx   . Stomach cancer Neg Hx     Social History   Social History  . Marital status: Married    Spouse name: N/A  . Number of children: N/A  . Years of education: N/A   Occupational History  . Not on file.   Social History Main Topics  . Smoking status: Former Research scientist (life sciences)  . Smokeless tobacco: Never Used  . Alcohol use Yes     Comment: 1 drink monthly  . Drug use: No  . Sexual activity: Not on file   Other Topics Concern  . Not on file   Social History Narrative  . No narrative on file     Current Outpatient Prescriptions:  .  LORazepam (ATIVAN) 0.5 MG tablet, Take 1 tablet (0.5 mg total) by mouth 2 (two) times daily as needed for anxiety., Disp: 60 tablet, Rfl: 2 .  lovastatin (MEVACOR) 20 MG tablet, Take 1 tablet (20 mg total) by mouth at bedtime., Disp: 90 tablet, Rfl: 1 .  meloxicam  (MOBIC) 7.5 MG tablet, Take 7.5 mg by mouth daily., Disp: , Rfl:  .  zolpidem (AMBIEN) 5 MG tablet, Take 1 tablet (5 mg total) by mouth at bedtime as needed for sleep., Disp: 90 tablet, Rfl: 1 .  ondansetron (ZOFRAN) 4 MG tablet, Take 1 tablet (4 mg total) by mouth every 8 (eight) hours as needed for nausea or vomiting. (Patient not taking: Reported on 01/10/2016), Disp: 20 tablet, Rfl: 0 .  pantoprazole (PROTONIX) 40 MG tablet, Take 1 tablet (40 mg total) by mouth daily. (Patient not taking: Reported on 01/10/2016), Disp: 30 tablet, Rfl: 3  Allergies  Allergen Reactions  . Tetanus Toxoids Shortness Of Breath  . Sulfa Antibiotics     Severe vomiting     Review of Systems  Psychiatric/Behavioral: The patient has insomnia.       Objective  Vitals:   01/10/16 0847  BP: 120/70  Pulse: 86  Resp: 16  Temp: 98.1 F (36.7 C)  TempSrc: Oral  SpO2: 96%  Weight: 176 lb 9.6 oz (80.1 kg)  Height: 5\' 4"  (1.626 m)    Physical Exam  Constitutional: She is  oriented to person, place, and time and well-developed, well-nourished, and in no distress.  HENT:  Head: Normocephalic and atraumatic.  Cardiovascular: Normal rate, regular rhythm and normal heart sounds.   No murmur heard. Pulmonary/Chest: Effort normal and breath sounds normal.  Neurological: She is alert and oriented to person, place, and time.  Psychiatric: Mood, memory, affect and judgment normal.  Nursing note and vitals reviewed.      Assessment & Plan  1. Insomnia, unspecified type  - zolpidem (AMBIEN) 5 MG tablet; Take 1 tablet (5 mg total) by mouth at bedtime as needed for sleep.  Dispense: 90 tablet; Refill: 1   Dashonda Bonneau [facility] 01/10/2016 9:01 AM

## 2016-02-16 ENCOUNTER — Encounter: Payer: Self-pay | Admitting: Family Medicine

## 2016-02-16 ENCOUNTER — Ambulatory Visit (INDEPENDENT_AMBULATORY_CARE_PROVIDER_SITE_OTHER): Payer: PPO | Admitting: Family Medicine

## 2016-02-16 DIAGNOSIS — E559 Vitamin D deficiency, unspecified: Secondary | ICD-10-CM | POA: Diagnosis not present

## 2016-02-16 DIAGNOSIS — Z0000 Encounter for general adult medical examination without abnormal findings: Secondary | ICD-10-CM | POA: Diagnosis not present

## 2016-02-16 DIAGNOSIS — E785 Hyperlipidemia, unspecified: Secondary | ICD-10-CM

## 2016-02-16 LAB — COMPLETE METABOLIC PANEL WITH GFR
ALT: 19 U/L (ref 6–29)
AST: 18 U/L (ref 10–35)
Albumin: 4.2 g/dL (ref 3.6–5.1)
Alkaline Phosphatase: 67 U/L (ref 33–130)
BUN: 17 mg/dL (ref 7–25)
CO2: 23 mmol/L (ref 20–31)
Calcium: 9.2 mg/dL (ref 8.6–10.4)
Chloride: 105 mmol/L (ref 98–110)
Creat: 0.9 mg/dL (ref 0.60–0.93)
GFR, Est African American: 75 mL/min (ref >=60)
GFR, Est Non African American: 65 mL/min (ref >=60)
Glucose, Bld: 97 mg/dL (ref 65–99)
Potassium: 4.5 mmol/L (ref 3.5–5.3)
Sodium: 141 mmol/L (ref 135–146)
Total Bilirubin: 0.5 mg/dL (ref 0.2–1.2)
Total Protein: 6.1 g/dL (ref 6.1–8.1)

## 2016-02-16 LAB — CBC WITH DIFFERENTIAL/PLATELET
Basophils Absolute: 87 cells/uL (ref 0–200)
Basophils Relative: 1 %
Eosinophils Absolute: 261 cells/uL (ref 15–500)
Eosinophils Relative: 3 %
HCT: 46.4 % — ABNORMAL HIGH (ref 35.0–45.0)
Hemoglobin: 15.3 g/dL (ref 11.7–15.5)
Lymphocytes Relative: 38 %
Lymphs Abs: 3306 cells/uL (ref 850–3900)
MCH: 30.7 pg (ref 27.0–33.0)
MCHC: 33 g/dL (ref 32.0–36.0)
MCV: 93 fL (ref 80.0–100.0)
MPV: 10.4 fL (ref 7.5–12.5)
Monocytes Absolute: 435 cells/uL (ref 200–950)
Monocytes Relative: 5 %
Neutro Abs: 4611 cells/uL (ref 1500–7800)
Neutrophils Relative %: 53 %
Platelets: 215 10*3/uL (ref 140–400)
RBC: 4.99 MIL/uL (ref 3.80–5.10)
RDW: 14.2 % (ref 11.0–15.0)
WBC: 8.7 10*3/uL (ref 3.8–10.8)

## 2016-02-16 LAB — TSH: TSH: 0.73 mIU/L

## 2016-02-16 LAB — LIPID PANEL
Cholesterol: 186 mg/dL (ref <200)
HDL: 54 mg/dL (ref >50)
LDL Cholesterol: 81 mg/dL (ref <100)
Total CHOL/HDL Ratio: 3.4 Ratio (ref <5.0)
Triglycerides: 257 mg/dL — ABNORMAL HIGH (ref <150)
VLDL: 51 mg/dL — ABNORMAL HIGH (ref <30)

## 2016-02-16 LAB — POCT GLYCOSYLATED HEMOGLOBIN (HGB A1C): Hemoglobin A1C: 5.5

## 2016-02-16 MED ORDER — LOVASTATIN 20 MG PO TABS: 20.0000 mg | ORAL_TABLET | Freq: Every day | ORAL | 1 refills | Status: DC

## 2016-02-16 NOTE — Progress Notes (Signed)
Name: [name]   MRN: [id number]    DOB: [birth year]   Date:02/16/2016       Progress Note  Subjective  Chief Complaint  Chief Complaint  Patient presents with  . Annual Exam    HPI   Pt. Presents for a complete physical exam. Last mammogram was in November 2015, was normal. Last colonoscopy was July 2015, due again in 2022. She does not remember when was her last DEXA Scan. She will receive booster for Pneumovax 23.    Past Medical History:  Diagnosis Date  . Allergy   . Arthritis   . Asthma   . Cancer (HCC)    shoulder, nose skin CA- basal cell CA  . GERD (gastroesophageal reflux disease)    occasional  . Hyperlipidemia     Past Surgical History:  Procedure Laterality Date  . COLONOSCOPY    . MOHS SURGERY    . TONSILLECTOMY    . VAGINAL HYSTERECTOMY      Family History  Problem Relation Age of Onset  . Colon cancer Paternal Uncle   . Rectal cancer Paternal Uncle   . Esophageal cancer Neg Hx   . Stomach cancer Neg Hx     Social History   Social History  . Marital status: Married    Spouse name: N/A  . Number of children: N/A  . Years of education: N/A   Occupational History  . Not on file.   Social History Main Topics  . Smoking status: Former Research scientist (life sciences)  . Smokeless tobacco: Never Used  . Alcohol use Yes     Comment: 1 drink monthly  . Drug use: No  . Sexual activity: Not on file   Other Topics Concern  . Not on file   Social History Narrative  . No narrative on file     Current Outpatient Prescriptions:  .  LORazepam (ATIVAN) 0.5 MG tablet, Take 1 tablet (0.5 mg total) by mouth 2 (two) times daily as needed for anxiety., Disp: 60 tablet, Rfl: 2 .  lovastatin (MEVACOR) 20 MG tablet, Take 1 tablet (20 mg total) by mouth at bedtime., Disp: 90 tablet, Rfl: 1 .  meloxicam (MOBIC) 7.5 MG tablet, Take 7.5 mg by mouth daily., Disp: , Rfl:  .  ondansetron (ZOFRAN) 4 MG tablet, Take 1 tablet (4 mg total) by mouth every 8 (eight) hours as needed  for nausea or vomiting., Disp: 20 tablet, Rfl: 0 .  pantoprazole (PROTONIX) 40 MG tablet, Take 1 tablet (40 mg total) by mouth daily., Disp: 30 tablet, Rfl: 3 .  zolpidem (AMBIEN) 5 MG tablet, Take 1 tablet (5 mg total) by mouth at bedtime as needed for sleep., Disp: 90 tablet, Rfl: 1  Allergies  Allergen Reactions  . Tetanus Toxoids Shortness Of Breath  . Sulfa Antibiotics     Severe vomiting     Review of Systems  Constitutional: Negative for chills, fever and malaise/fatigue.  HENT: Negative for congestion and sore throat.   Eyes: Negative for blurred vision and double vision.  Respiratory: Negative for cough, sputum production and shortness of breath.   Cardiovascular: Negative for chest pain and leg swelling.  Gastrointestinal: Negative for abdominal pain, blood in stool, constipation and diarrhea.  Genitourinary: Negative for dysuria and hematuria.  Musculoskeletal: Positive for neck pain (stifness in nedk occasionally.). Negative for back pain, joint pain and myalgias.  Neurological: Negative for headaches.  Psychiatric/Behavioral: Negative for depression. The patient is not nervous/anxious and does not have insomnia.  Objective  Vitals:   02/16/16 1059  BP: 122/70  Pulse: 76  Resp: 16  Temp: 98 F (36.7 C)  TempSrc: Oral  SpO2: 94%  Weight: 182 lb 8 oz (82.8 kg)  Height: 5\' 4"  (1.626 m)    Physical Exam  Constitutional: She is oriented to person, place, and time and well-developed, well-nourished, and in no distress.  HENT:  Head: Normocephalic and atraumatic.  Cardiovascular: Normal rate, regular rhythm and normal heart sounds.   No murmur heard. Pulmonary/Chest: Effort normal and breath sounds normal. She has no wheezes.  Abdominal: Soft. Bowel sounds are normal. There is no tenderness.  Musculoskeletal: Normal range of motion. She exhibits no edema.       Lumbar back: She exhibits no tenderness and no pain.  Neurological: She is alert and oriented to  person, place, and time.  Psychiatric: Mood, memory, affect and judgment normal.  Nursing note and vitals reviewed.     Assessment & Plan  1. Well woman exam without gynecological exam  - CBC with Differential - TSH - Vitamin D (25 hydroxy) - DG Bone Density; Future - MM Digital Screening; Future - POCT HgB A1C  2. Hyperlipidemia, unspecified hyperlipidemia type  - lovastatin (MEVACOR) 20 MG tablet; Take 1 tablet (20 mg total) by mouth at bedtime.  Dispense: 90 tablet; Refill: 1 - COMPLETE METABOLIC PANEL WITH GFR - Lipid Profile   Aqeel Norgaard [facility] 02/16/2016 11:03 AM

## 2016-02-17 LAB — VITAMIN D 25 HYDROXY (VIT D DEFICIENCY, FRACTURES): VIT D 25 HYDROXY: 35 ng/mL (ref 30–100)

## 2016-03-05 ENCOUNTER — Encounter: Payer: Self-pay | Admitting: Family Medicine

## 2016-04-05 ENCOUNTER — Ambulatory Visit
Admission: RE | Admit: 2016-04-05 | Discharge: 2016-04-05 | Disposition: A | Discharge status: Home / Self Care | Payer: PPO | Source: Ambulatory Visit | Attending: Family Medicine | Admitting: Family Medicine

## 2016-04-05 DIAGNOSIS — Z780 Asymptomatic menopausal state: Secondary | ICD-10-CM | POA: Diagnosis not present

## 2016-04-05 DIAGNOSIS — Z1231 Encounter for screening mammogram for malignant neoplasm of breast: Secondary | ICD-10-CM | POA: Diagnosis not present

## 2016-04-05 DIAGNOSIS — Z0000 Encounter for general adult medical examination without abnormal findings: Secondary | ICD-10-CM

## 2016-04-05 DIAGNOSIS — M85852 Other specified disorders of bone density and structure, left thigh: Secondary | ICD-10-CM | POA: Insufficient documentation

## 2016-05-31 ENCOUNTER — Ambulatory Visit (INDEPENDENT_AMBULATORY_CARE_PROVIDER_SITE_OTHER): Payer: PPO | Admitting: Family Medicine

## 2016-05-31 ENCOUNTER — Encounter: Payer: Self-pay | Admitting: Family Medicine

## 2016-05-31 VITALS — BP 120/73 | HR 74 | Temp 98.1°F | Resp 17 | Ht 64.0 in | Wt 176.4 lb

## 2016-05-31 DIAGNOSIS — Z23 Encounter for immunization: Secondary | ICD-10-CM | POA: Diagnosis not present

## 2016-05-31 DIAGNOSIS — G4700 Insomnia, unspecified: Secondary | ICD-10-CM | POA: Diagnosis not present

## 2016-05-31 DIAGNOSIS — E785 Hyperlipidemia, unspecified: Secondary | ICD-10-CM

## 2016-05-31 MED ORDER — LOVASTATIN 20 MG PO TABS: 20.0000 mg | ORAL_TABLET | Freq: Every day | ORAL | 1 refills | Status: DC

## 2016-05-31 MED ORDER — ZOLPIDEM TARTRATE 5 MG PO TABS: 5.0000 mg | ORAL_TABLET | Freq: Every evening | ORAL | 1 refills | Status: DC | PRN

## 2016-05-31 NOTE — Progress Notes (Signed)
Name: [name]   MRN: [id number]    DOB: 10-22-1945   Date:05/31/2016       Progress Note  Subjective  Chief Complaint  Chief Complaint  Patient presents with  . Follow-up    3 mo  . Medication Refill    Insomnia  Primary symptoms: difficulty falling asleep, frequent awakening.  The problem occurs nightly. Typical bedtime:  8-10 P.M..  How long after going to bed to you fall asleep: 30-60 minutes.    Hyperlipidemia  This is a chronic problem. The problem is uncontrolled. Recent lipid tests were reviewed and are high (elevated Triglycerides). Pertinent negatives include no leg pain, myalgias or shortness of breath. Current antihyperlipidemic treatment includes statins.     Past Medical History:  Diagnosis Date  . Allergy   . Arthritis   . Asthma   . Cancer (HCC)    shoulder, nose skin CA- basal cell CA  . GERD (gastroesophageal reflux disease)    occasional  . Hyperlipidemia     Past Surgical History:  Procedure Laterality Date  . BREAST BIOPSY Right    core bx- neg  . COLONOSCOPY    . MOHS SURGERY    . TONSILLECTOMY    . VAGINAL HYSTERECTOMY      Family History  Problem Relation Age of Onset  . Colon cancer Paternal Uncle   . Rectal cancer Paternal Uncle   . Esophageal cancer Neg Hx   . Stomach cancer Neg Hx   . Breast cancer Neg Hx     Social History   Social History  . Marital status: Married    Spouse name: N/A  . Number of children: N/A  . Years of education: N/A   Occupational History  . Not on file.   Social History Main Topics  . Smoking status: Former Research scientist (life sciences)  . Smokeless tobacco: Never Used  . Alcohol use Yes     Comment: 1 drink monthly  . Drug use: No  . Sexual activity: Not on file   Other Topics Concern  . Not on file   Social History Narrative  . No narrative on file     Current Outpatient Prescriptions:  .  LORazepam (ATIVAN) 0.5 MG tablet, Take 1 tablet (0.5 mg total) by mouth 2 (two) times daily as needed for  anxiety., Disp: 60 tablet, Rfl: 2 .  lovastatin (MEVACOR) 20 MG tablet, Take 1 tablet (20 mg total) by mouth at bedtime., Disp: 90 tablet, Rfl: 1 .  meloxicam (MOBIC) 7.5 MG tablet, Take 7.5 mg by mouth daily., Disp: , Rfl:  .  ondansetron (ZOFRAN) 4 MG tablet, Take 1 tablet (4 mg total) by mouth every 8 (eight) hours as needed for nausea or vomiting., Disp: 20 tablet, Rfl: 0 .  pantoprazole (PROTONIX) 40 MG tablet, Take 1 tablet (40 mg total) by mouth daily., Disp: 30 tablet, Rfl: 3 .  zolpidem (AMBIEN) 5 MG tablet, Take 1 tablet (5 mg total) by mouth at bedtime as needed for sleep., Disp: 90 tablet, Rfl: 1  Allergies  Allergen Reactions  . Tetanus Toxoids Shortness Of Breath  . Sulfa Antibiotics     Severe vomiting     Review of Systems  Respiratory: Negative for shortness of breath.   Musculoskeletal: Negative for myalgias.  Psychiatric/Behavioral: The patient has insomnia.      Objective  Vitals:   05/31/16 1046  BP: 120/73  Pulse: 74  Resp: 17  Temp: 98.1 F (36.7 C)  TempSrc: Oral  SpO2:  96%  Weight: 176 lb 6.4 oz (80 kg)  Height: 5\' 4"  (1.626 m)    Physical Exam  Constitutional: She is oriented to person, place, and time and well-developed, well-nourished, and in no distress.  HENT:  Head: Normocephalic and atraumatic.  Cardiovascular: Normal rate, regular rhythm and normal heart sounds.   No murmur heard. Pulmonary/Chest: Effort normal and breath sounds normal.  Abdominal: Soft. Bowel sounds are normal. There is no tenderness.  Musculoskeletal: She exhibits no edema.  Neurological: She is alert and oriented to person, place, and time.  Psychiatric: Mood, memory, affect and judgment normal.  Nursing note and vitals reviewed.      Assessment & Plan  1. Hyperlipidemia, unspecified hyperlipidemia type Elevated Triglycerides, continue on statin, repeat and consider starting on triglyceride lowering therapy - lovastatin (MEVACOR) 20 MG tablet; Take 1 tablet  (20 mg total) by mouth at bedtime.  Dispense: 90 tablet; Refill: 1 - Lipid panel - COMPLETE METABOLIC PANEL WITH GFR  2. Insomnia, unspecified type  - zolpidem (AMBIEN) 5 MG tablet; Take 1 tablet (5 mg total) by mouth at bedtime as needed for sleep.  Dispense: 90 tablet; Refill: 1 dispense: 90 tablet; Refill: 1  3. Need for pneumococcal vaccination  - Pneumococcal conjugate vaccine 13-valent   Lynore Coscia [facility] 05/31/2016 11:09 AM

## 2016-06-01 ENCOUNTER — Ambulatory Visit: Payer: PPO | Admitting: Family Medicine

## 2016-06-01 LAB — COMPLETE METABOLIC PANEL WITH GFR
ALT: 16 U/L (ref 6–29)
AST: 18 U/L (ref 10–35)
Albumin: 4.1 g/dL (ref 3.6–5.1)
Alkaline Phosphatase: 56 U/L (ref 33–130)
BUN: 12 mg/dL (ref 7–25)
CHLORIDE: 107 mmol/L (ref 98–110)
CO2: 25 mmol/L (ref 20–31)
CREATININE: 0.9 mg/dL (ref 0.60–0.93)
Calcium: 8.9 mg/dL (ref 8.6–10.4)
GFR, EST AFRICAN AMERICAN: 74 mL/min (ref >=60)
GFR, Est Non African American: 65 mL/min (ref >=60)
Glucose, Bld: 91 mg/dL (ref 65–99)
POTASSIUM: 4.3 mmol/L (ref 3.5–5.3)
Sodium: 143 mmol/L (ref 135–146)
Total Bilirubin: 0.4 mg/dL (ref 0.2–1.2)
Total Protein: 6.1 g/dL (ref 6.1–8.1)

## 2016-06-01 LAB — LIPID PANEL
CHOL/HDL RATIO: 3 ratio (ref <5.0)
CHOLESTEROL: 158 mg/dL (ref <200)
HDL: 53 mg/dL (ref >50)
LDL Cholesterol: 67 mg/dL (ref <100)
TRIGLYCERIDES: 192 mg/dL — AB (ref <150)
VLDL: 38 mg/dL — AB (ref <30)

## 2016-06-07 ENCOUNTER — Other Ambulatory Visit: Payer: Self-pay | Admitting: Emergency Medicine

## 2016-06-07 DIAGNOSIS — F411 Generalized anxiety disorder: Secondary | ICD-10-CM

## 2016-06-07 DIAGNOSIS — F430 Acute stress reaction: Principal | ICD-10-CM

## 2016-06-07 MED ORDER — LORAZEPAM 0.5 MG PO TABS: 0.5000 mg | ORAL_TABLET | Freq: Two times a day (BID) | ORAL | 2 refills | Status: DC | PRN

## 2016-06-12 DIAGNOSIS — C44112 Basal cell carcinoma of skin of right eyelid, including canthus: Secondary | ICD-10-CM | POA: Diagnosis not present

## 2016-06-12 DIAGNOSIS — D485 Neoplasm of uncertain behavior of skin: Secondary | ICD-10-CM | POA: Diagnosis not present

## 2016-06-12 DIAGNOSIS — Z85828 Personal history of other malignant neoplasm of skin: Secondary | ICD-10-CM | POA: Diagnosis not present

## 2016-06-12 DIAGNOSIS — D229 Melanocytic nevi, unspecified: Secondary | ICD-10-CM | POA: Diagnosis not present

## 2016-06-12 DIAGNOSIS — L578 Other skin changes due to chronic exposure to nonionizing radiation: Secondary | ICD-10-CM | POA: Diagnosis not present

## 2016-06-12 DIAGNOSIS — L812 Freckles: Secondary | ICD-10-CM | POA: Diagnosis not present

## 2016-06-12 DIAGNOSIS — D1800 Hemangioma unspecified site: Secondary | ICD-10-CM | POA: Diagnosis not present

## 2016-06-12 DIAGNOSIS — Z1283 Encounter for screening for malignant neoplasm of skin: Secondary | ICD-10-CM | POA: Diagnosis not present

## 2016-06-12 DIAGNOSIS — L821 Other seborrheic keratosis: Secondary | ICD-10-CM | POA: Diagnosis not present

## 2016-07-03 DIAGNOSIS — H04123 Dry eye syndrome of bilateral lacrimal glands: Secondary | ICD-10-CM | POA: Diagnosis not present

## 2016-08-22 DIAGNOSIS — C44112 Basal cell carcinoma of skin of right eyelid, including canthus: Secondary | ICD-10-CM | POA: Diagnosis not present

## 2016-08-24 DIAGNOSIS — S76012A Strain of muscle, fascia and tendon of left hip, initial encounter: Secondary | ICD-10-CM | POA: Diagnosis not present

## 2016-08-29 DIAGNOSIS — M25552 Pain in left hip: Secondary | ICD-10-CM | POA: Diagnosis not present

## 2016-08-31 DIAGNOSIS — M25552 Pain in left hip: Secondary | ICD-10-CM | POA: Diagnosis not present

## 2016-09-04 DIAGNOSIS — M25552 Pain in left hip: Secondary | ICD-10-CM | POA: Diagnosis not present

## 2016-09-06 DIAGNOSIS — M25552 Pain in left hip: Secondary | ICD-10-CM | POA: Diagnosis not present

## 2016-09-08 DIAGNOSIS — M25552 Pain in left hip: Secondary | ICD-10-CM | POA: Diagnosis not present

## 2016-09-11 DIAGNOSIS — M25552 Pain in left hip: Secondary | ICD-10-CM | POA: Diagnosis not present

## 2016-11-16 ENCOUNTER — Ambulatory Visit: Payer: PPO | Admitting: Family Medicine

## 2016-12-04 ENCOUNTER — Ambulatory Visit (INDEPENDENT_AMBULATORY_CARE_PROVIDER_SITE_OTHER): Payer: PPO | Admitting: Family Medicine

## 2016-12-04 ENCOUNTER — Encounter: Payer: Self-pay | Admitting: Family Medicine

## 2016-12-04 DIAGNOSIS — G4700 Insomnia, unspecified: Secondary | ICD-10-CM

## 2016-12-04 DIAGNOSIS — E785 Hyperlipidemia, unspecified: Secondary | ICD-10-CM | POA: Diagnosis not present

## 2016-12-04 DIAGNOSIS — F430 Acute stress reaction: Secondary | ICD-10-CM | POA: Diagnosis not present

## 2016-12-04 DIAGNOSIS — F411 Generalized anxiety disorder: Secondary | ICD-10-CM

## 2016-12-04 LAB — LIPID PANEL
CHOLESTEROL: 154 mg/dL (ref <200)
HDL: 56 mg/dL (ref >50)
LDL Cholesterol (Calc): 72 mg/dL (calc)
Non-HDL Cholesterol (Calc): 98 mg/dL (calc) (ref <130)
TRIGLYCERIDES: 180 mg/dL — AB (ref <150)
Total CHOL/HDL Ratio: 2.8 (calc) (ref <5.0)

## 2016-12-04 MED ORDER — LORAZEPAM 0.5 MG PO TABS: 0.5000 mg | ORAL_TABLET | Freq: Two times a day (BID) | ORAL | 2 refills | Status: DC | PRN

## 2016-12-04 MED ORDER — LOVASTATIN 20 MG PO TABS: 20.0000 mg | ORAL_TABLET | Freq: Every day | ORAL | 1 refills | Status: DC

## 2016-12-04 MED ORDER — ZOLPIDEM TARTRATE 5 MG PO TABS: 5.0000 mg | ORAL_TABLET | Freq: Every evening | ORAL | 0 refills | Status: DC | PRN

## 2016-12-04 NOTE — Progress Notes (Signed)
Name: [name]   MRN: [id number]    DOB: 03-Nov-1945   Date:12/04/2016       Progress Note  Subjective  Chief Complaint  Chief Complaint  Patient presents with  . Medication Refill    ambien, lovastatin and ativian   . Follow-up    3 month     Insomnia  Primary symptoms: difficulty falling asleep, frequent awakening.  The problem occurs nightly. The symptoms are aggravated by anxiety. Past treatments include medication. Typical bedtime:  8-10 P.M. (9:30PM).  How long after going to bed to you fall asleep: 30-60 minutes (45 minutes).    Hyperlipidemia  This is a chronic problem. The problem is uncontrolled. Recent lipid tests were reviewed and are high (elevated Triglycerides). Pertinent negatives include no leg pain, myalgias or shortness of breath. Current antihyperlipidemic treatment includes statins.  Anxiety  Presents for follow-up visit. Symptoms include insomnia. Patient reports no shortness of breath. Primary symptoms comment: mainly associated with traveling in the motorhome. The severity of symptoms is moderate.       Past Medical History:  Diagnosis Date  . Allergy   . Arthritis   . Asthma   . Cancer (HCC)    shoulder, nose skin CA- basal cell CA  . GERD (gastroesophageal reflux disease)    occasional  . Hyperlipidemia     Past Surgical History:  Procedure Laterality Date  . BREAST BIOPSY Right    core bx- neg  . COLONOSCOPY    . MOHS SURGERY    . TONSILLECTOMY    . VAGINAL HYSTERECTOMY      Family History  Problem Relation Age of Onset  . Colon cancer Paternal Uncle   . Rectal cancer Paternal Uncle   . Esophageal cancer Neg Hx   . Stomach cancer Neg Hx   . Breast cancer Neg Hx     Social History   Social History  . Marital status: Married    Spouse name: N/A  . Number of children: N/A  . Years of education: N/A   Occupational History  . Not on file.   Social History Main Topics  . Smoking status: Former Research scientist (life sciences)  . Smokeless tobacco:  Never Used  . Alcohol use Yes     Comment: 1 drink monthly  . Drug use: No  . Sexual activity: Not Currently   Other Topics Concern  . Not on file   Social History Narrative  . No narrative on file     Current Outpatient Prescriptions:  .  calcium carbonate (OS-CAL - DOSED IN MG OF ELEMENTAL CALCIUM) 1250 (500 Ca) MG tablet, Take 1 tablet by mouth daily with breakfast., Disp: , Rfl:  .  GLUCOSAMINE-CHONDROIT-CALCIUM PO, Take 1,000 mg by mouth daily., Disp: , Rfl:  .  LORazepam (ATIVAN) 0.5 MG tablet, Take 1 tablet (0.5 mg total) by mouth 2 (two) times daily as needed for anxiety., Disp: 60 tablet, Rfl: 2 .  lovastatin (MEVACOR) 20 MG tablet, Take 1 tablet (20 mg total) by mouth at bedtime., Disp: 90 tablet, Rfl: 1 .  Multiple Vitamin (MULTIVITAMIN) tablet, Take 1 tablet by mouth daily., Disp: , Rfl:  .  zolpidem (AMBIEN) 5 MG tablet, Take 1 tablet (5 mg total) by mouth at bedtime as needed for sleep., Disp: 90 tablet, Rfl: 1 .  meloxicam (MOBIC) 7.5 MG tablet, Take 7.5 mg by mouth daily., Disp: , Rfl:  .  ondansetron (ZOFRAN) 4 MG tablet, Take 1 tablet (4 mg total) by mouth every 8 (  eight) hours as needed for nausea or vomiting. (Patient not taking: Reported on 12/04/2016), Disp: 20 tablet, Rfl: 0 .  pantoprazole (PROTONIX) 40 MG tablet, Take 1 tablet (40 mg total) by mouth daily. (Patient not taking: Reported on 12/04/2016), Disp: 30 tablet, Rfl: 3  Allergies  Allergen Reactions  . Tetanus Toxoids Shortness Of Breath  . Bromsulphalein   . Sulfa Antibiotics     Severe vomiting     Review of Systems  Respiratory: Negative for shortness of breath.   Musculoskeletal: Negative for myalgias.  Psychiatric/Behavioral: The patient has insomnia.       Objective  Vitals:   12/04/16 0843  BP: 116/68  Pulse: 86  Resp: 16  Temp: 98.1 F (36.7 C)  TempSrc: Oral  SpO2: 96%  Weight: 177 lb 11.2 oz (80.6 kg)  Height: 5\' 4"  (1.626 m)    Physical Exam  Constitutional: She is  oriented to person, place, and time and well-developed, well-nourished, and in no distress.  HENT:  Head: Normocephalic and atraumatic.  Cardiovascular: Normal rate, regular rhythm and normal heart sounds.  Exam reveals no gallop.   No murmur heard. Pulmonary/Chest: Effort normal and breath sounds normal. She has no wheezes.  Neurological: She is alert and oriented to person, place, and time.  Psychiatric: Mood, memory, affect and judgment normal.  Nursing note and vitals reviewed.    Assessment & Plan  1. Anxiety as acute reaction to exceptional stress Symptoms stable, takes lorazepam as needed, discussed my departure from the practice and that she will have to be seen by a new provider after I leave - LORazepam (ATIVAN) 0.5 MG tablet; Take 1 tablet (0.5 mg total) by mouth 2 (two) times daily as needed for anxiety.  Dispense: 60 tablet; Refill: 2  2. Hyperlipidemia, unspecified hyperlipidemia type Repeat FLP, continue on statin - lovastatin (MEVACOR) 20 MG tablet; Take 1 tablet (20 mg total) by mouth at bedtime.  Dispense: 90 tablet; Refill: 1 - Lipid panel  3. Insomnia, unspecified type  - zolpidem (AMBIEN) 5 MG tablet; Take 1 tablet (5 mg total) by mouth at bedtime as needed for sleep.  Dispense: 90 tablet; Refill: 0   Deigo Alonso [facility] 12/04/2016 8:50 AM

## 2016-12-04 NOTE — Progress Notes (Signed)
The following letter was provided to [name] during their visit today: ---------------------------------------  Dear valued [facility] Patient,  I am writing to share that as of March 30, 2017, I will no longer be seeing patients at [facility]. While it has been my privilege to care for you as a physician, I have decided to move outside of [place] to pursue other opportunities.  The staff at [facility] has been supportive of my decision and are supportive of any patients who have been under my care.  They will be happy to provide care to you and your family.  The office staff will do everything they can to ensure a seamless transition of care at [facility].  However if you are on any controlled substance medications (i.e. Pain medication or benzodiazepines), they will no longer be able to refill those medications, but we will be more than happy to refer you to a specialist.  [facility] will also assist you with the transfer of medical records should you wish to seek care elsewhere.  If you have any questions about your future care, you may call the office at [phone].  I have enjoyed getting to know my patients here and I wish you the very best.  Sincerely,  Rochel Brome, MD  ---------------------------------------  A written copy of this letter was given to the patient.  The patient verbalizes understanding of the letter, and does request referral to specialist or to new primary care provider.  This decision has been conveyed to Dr. Manuella Ghazi, who will place any appropriate referrals during today's visit.

## 2016-12-29 ENCOUNTER — Encounter: Payer: Self-pay | Admitting: Family Medicine

## 2016-12-29 ENCOUNTER — Ambulatory Visit: Payer: PPO | Admitting: Family Medicine

## 2016-12-29 VITALS — BP 130/80 | HR 87 | Temp 98.2°F | Resp 16 | Ht 64.0 in | Wt 178.0 lb

## 2016-12-29 DIAGNOSIS — N3001 Acute cystitis with hematuria: Secondary | ICD-10-CM | POA: Diagnosis not present

## 2016-12-29 LAB — POCT URINALYSIS DIPSTICK
BILIRUBIN UA: NEGATIVE
GLUCOSE UA: NEGATIVE
Ketones, UA: NEGATIVE
NITRITE UA: NEGATIVE
PH UA: 6 (ref 5.0–8.0)
Spec Grav, UA: 1.015 (ref 1.010–1.025)
UROBILINOGEN UA: NEGATIVE U/dL — AB

## 2016-12-29 MED ORDER — NITROFURANTOIN MONOHYD MACRO 100 MG PO CAPS: 100.0000 mg | ORAL_CAPSULE | Freq: Two times a day (BID) | ORAL | 0 refills | Status: AC

## 2016-12-29 NOTE — Patient Instructions (Addendum)

## 2016-12-29 NOTE — Progress Notes (Signed)
Name: [name]   MRN: [id number]    DOB: 1946/01/18   Date:12/29/2016       Progress Note  Subjective  Chief Complaint  Chief Complaint  Patient presents with  . Urinary Tract Infection    for 5 days, burning    HPI  Patient presents with concern for dysuria and frequent urination x5 days with hematuria x1day.  She notes she has seen some small clots in her urine as well.  She states she always has hematuria when she has a UTI. She denies abdominal/flank/back pain, NVD, fevers/chills, or history of kidney stones.  Patient Active Problem List   Diagnosis Date Noted  . Well woman exam without gynecological exam 02/16/2016  . Hyperlipidemia 12/09/2014  . Abdominal pain 12/09/2014  . Insomnia 12/09/2014  . Anxiety as acute reaction to exceptional stress 12/09/2014    Social History   Tobacco Use  . Smoking status: Former Research scientist (life sciences)  . Smokeless tobacco: Never Used  Substance Use Topics  . Alcohol use: Yes    Comment: 1 drink monthly     Current Outpatient Medications:  .  calcium carbonate (OS-CAL - DOSED IN MG OF ELEMENTAL CALCIUM) 1250 (500 Ca) MG tablet, Take 1 tablet by mouth daily with breakfast., Disp: , Rfl:  .  GLUCOSAMINE-CHONDROIT-CALCIUM PO, Take 1,000 mg by mouth daily., Disp: , Rfl:  .  LORazepam (ATIVAN) 0.5 MG tablet, Take 1 tablet (0.5 mg total) by mouth 2 (two) times daily as needed for anxiety., Disp: 60 tablet, Rfl: 2 .  lovastatin (MEVACOR) 20 MG tablet, Take 1 tablet (20 mg total) by mouth at bedtime., Disp: 90 tablet, Rfl: 1 .  meloxicam (MOBIC) 7.5 MG tablet, Take 7.5 mg by mouth daily., Disp: , Rfl:  .  Multiple Vitamin (MULTIVITAMIN) tablet, Take 1 tablet by mouth daily., Disp: , Rfl:  .  ondansetron (ZOFRAN) 4 MG tablet, Take 1 tablet (4 mg total) by mouth every 8 (eight) hours as needed for nausea or vomiting., Disp: 20 tablet, Rfl: 0 .  pantoprazole (PROTONIX) 40 MG tablet, Take 1 tablet (40 mg total) by mouth daily., Disp: 30 tablet, Rfl: 3 .   zolpidem (AMBIEN) 5 MG tablet, Take 1 tablet (5 mg total) by mouth at bedtime as needed for sleep., Disp: 90 tablet, Rfl: 0  Allergies  Allergen Reactions  . Tetanus Toxoids Shortness Of Breath  . Bromsulphalein   . Sulfa Antibiotics     Severe vomiting    ROS  Ten systems reviewed and is negative except as mentioned in HPI  Objective  Vitals:   12/29/16 0832  BP: 130/80  Pulse: 87  Resp: 16  Temp: 98.2 F (36.8 C)  TempSrc: Oral  SpO2: 94%  Weight: 178 lb (80.7 kg)  Height: 5\' 4"  (1.626 m)   Body mass index is 30.55 kg/m.  Nursing Note and Vital Signs reviewed.  Physical Exam  Constitutional: Patient appears well-developed and well-nourished. Obese No distress.  HEENT: head atraumatic, normocephalic Cardiovascular: Normal rate, regular rhythm, S1/S2 present.  No murmur or rub heard. No BLE edema. Pulmonary/Chest: Effort normal and breath sounds clear. No respiratory distress or retractions. Abdominal: Soft and non-tender, bowel sounds present x4 quadrants.  No CVA Tenderness Psychiatric: Patient has a normal mood and affect. behavior is normal. Judgment and thought content normal.  Recent Results (from the past 2160 hour(s))  Lipid panel     Status: Abnormal   Collection Time: 12/04/16  9:24 AM  Result Value Ref Range  Cholesterol 154 <200 mg/dL   HDL 56 >50 mg/dL   Triglycerides 180 (H) <150 mg/dL   LDL Cholesterol (Calc) 72 mg/dL (calc)    Comment: Reference range: <100 . Desirable range <100 mg/dL for primary prevention;   <70 mg/dL for patients with CHD or diabetic patients  with > or = 2 CHD risk factors. Marland Kitchen LDL-C is now calculated using the Martin-Hopkins  calculation, which is a validated novel method providing  better accuracy than the Friedewald equation in the  estimation of LDL-C.  Cresenciano Genre et al. Annamaria Helling. 6286;381(77): 2061-2068  (http://education.[employer].com/faq/FAQ164)    Total CHOL/HDL Ratio 2.8 <5.0 (calc)   Non-HDL Cholesterol  (Calc) 98 <130 mg/dL (calc)    Comment: For patients with diabetes plus 1 major ASCVD risk  factor, treating to a non-HDL-C goal of <100 mg/dL  (LDL-C of <70 mg/dL) is considered a therapeutic  option.   POCT urinalysis dipstick     Status: Abnormal   Collection Time: 12/29/16  8:35 AM  Result Value Ref Range   Color, UA gold    Clarity, UA cloudy    Glucose, UA negative    Bilirubin, UA negative    Ketones, UA negative    Spec Grav, UA 1.015 1.010 - 1.025   Blood, UA large    pH, UA 6.0 5.0 - 8.0   Protein, UA trace    Urobilinogen, UA negative (A) 0.2 or 1.0 E.U./dL   Nitrite, UA negative    Leukocytes, UA Large (3+) (A) Negative     Assessment & Plan  1. Acute cystitis with hematuria - POCT urinalysis dipstick - nitrofurantoin, macrocrystal-monohydrate, (MACROBID) 100 MG capsule; Take 1 capsule (100 mg total) 2 (two) times daily for 5 days by mouth.  Dispense: 10 capsule; Refill: 0 - Urine Culture - Advised to return in 3 weeks to ensure resolution of hematuria.  -Red flags and when to present for emergency care or RTC including fever >101.80F,increased hematuria, lightheadedness, abdomina/back/flank pain, nausea/vomiting new/worsening/un-resolving symptoms, reviewed with patient at time of visit. Follow up and care instructions discussed and provided in AVS.

## 2016-12-30 LAB — URINE CULTURE
MICRO NUMBER:: 81295545
RESULT: NO GROWTH
SPECIMEN QUALITY: ADEQUATE

## 2017-01-19 ENCOUNTER — Ambulatory Visit (INDEPENDENT_AMBULATORY_CARE_PROVIDER_SITE_OTHER): Payer: PPO | Admitting: Family Medicine

## 2017-01-19 DIAGNOSIS — R350 Frequency of micturition: Secondary | ICD-10-CM | POA: Diagnosis not present

## 2017-01-19 LAB — POCT URINALYSIS DIPSTICK
Bilirubin, UA: NEGATIVE
Blood, UA: NEGATIVE
Glucose, UA: NEGATIVE
KETONES UA: NEGATIVE
NITRITE UA: NEGATIVE
PROTEIN UA: NEGATIVE
Spec Grav, UA: 1.01 (ref 1.010–1.025)
Urobilinogen, UA: 0.2 E.U./dL
pH, UA: 6 (ref 5.0–8.0)

## 2017-01-19 NOTE — Progress Notes (Signed)
Pt came for a repeat u/a per Raquel Sarna. Raquel Sarna was made aware of results.

## 2017-02-15 ENCOUNTER — Ambulatory Visit: Payer: PPO | Admitting: Family Medicine

## 2017-04-09 ENCOUNTER — Encounter: Payer: Self-pay | Admitting: Family Medicine

## 2017-04-09 ENCOUNTER — Ambulatory Visit (INDEPENDENT_AMBULATORY_CARE_PROVIDER_SITE_OTHER): Payer: PPO | Admitting: Family Medicine

## 2017-04-09 VITALS — BP 128/62 | HR 83 | Temp 97.6°F | Resp 14 | Wt 175.0 lb

## 2017-04-09 DIAGNOSIS — Z1231 Encounter for screening mammogram for malignant neoplasm of breast: Secondary | ICD-10-CM | POA: Diagnosis not present

## 2017-04-09 DIAGNOSIS — Z5181 Encounter for therapeutic drug level monitoring: Secondary | ICD-10-CM

## 2017-04-09 DIAGNOSIS — G4700 Insomnia, unspecified: Secondary | ICD-10-CM

## 2017-04-09 DIAGNOSIS — F411 Generalized anxiety disorder: Secondary | ICD-10-CM

## 2017-04-09 DIAGNOSIS — F430 Acute stress reaction: Secondary | ICD-10-CM

## 2017-04-09 DIAGNOSIS — E785 Hyperlipidemia, unspecified: Secondary | ICD-10-CM | POA: Diagnosis not present

## 2017-04-09 DIAGNOSIS — Z1239 Encounter for other screening for malignant neoplasm of breast: Secondary | ICD-10-CM

## 2017-04-09 LAB — COMPLETE METABOLIC PANEL WITH GFR
AG Ratio: 2 (calc) (ref 1.0–2.5)
ALT: 16 U/L (ref 6–29)
AST: 18 U/L (ref 10–35)
Albumin: 4.2 g/dL (ref 3.6–5.1)
Alkaline phosphatase (APISO): 58 U/L (ref 33–130)
BUN/Creatinine Ratio: 14 (calc) (ref 6–22)
BUN: 16 mg/dL (ref 7–25)
CALCIUM: 9.2 mg/dL (ref 8.6–10.4)
CO2: 29 mmol/L (ref 20–32)
CREATININE: 1.11 mg/dL — AB (ref 0.60–0.93)
Chloride: 107 mmol/L (ref 98–110)
GFR, EST AFRICAN AMERICAN: 57 mL/min/{1.73_m2} — AB (ref >=60)
GFR, EST NON AFRICAN AMERICAN: 50 mL/min/{1.73_m2} — AB (ref >=60)
GLOBULIN: 2.1 g/dL (ref 1.9–3.7)
Glucose, Bld: 88 mg/dL (ref 65–139)
Potassium: 4.4 mmol/L (ref 3.5–5.3)
Sodium: 144 mmol/L (ref 135–146)
TOTAL PROTEIN: 6.3 g/dL (ref 6.1–8.1)
Total Bilirubin: 0.5 mg/dL (ref 0.2–1.2)

## 2017-04-09 LAB — LIPID PANEL
CHOL/HDL RATIO: 3.8 (calc) (ref <5.0)
Cholesterol: 180 mg/dL (ref <200)
HDL: 48 mg/dL — AB (ref >50)
LDL Cholesterol (Calc): 91 mg/dL (calc)
NON-HDL CHOLESTEROL (CALC): 132 mg/dL — AB (ref <130)
TRIGLYCERIDES: 287 mg/dL — AB (ref <150)

## 2017-04-09 MED ORDER — LOVASTATIN 20 MG PO TABS: 20.0000 mg | ORAL_TABLET | Freq: Every day | ORAL | 1 refills | Status: DC

## 2017-04-09 MED ORDER — HYDROXYZINE PAMOATE 25 MG PO CAPS: 25.0000 mg | ORAL_CAPSULE | Freq: Three times a day (TID) | ORAL | 1 refills | Status: DC | PRN

## 2017-04-09 NOTE — Assessment & Plan Note (Signed)
Check liver and kidneys 

## 2017-04-09 NOTE — Assessment & Plan Note (Signed)
Recommend CBT-I, process thoughts 2 hours BEFORE going in to the bedroom; avoiding caffeine or dark chocolate or tea, 10 hours befrore bed

## 2017-04-09 NOTE — Patient Instructions (Addendum)
  Try to limit saturated fats in your diet (bologna, hot dogs, barbeque, cheeseburgers, hamburgers, steak, bacon, sausage, cheese, etc.) and get more fresh fruits, vegetables, and whole grains Stop the lorazepam Stop the zolpidem I'll recommend cognitive behavioral therapy for insomnia (CBT-I), see the list of providers You can use the hydroxyzine for anxiety or sleep if needed

## 2017-04-09 NOTE — Assessment & Plan Note (Signed)
Stop the benzo; start hydroxyzine, manage stress

## 2017-04-09 NOTE — Assessment & Plan Note (Signed)
Limit saturated fats; increase fiber and whole grains; refilled the statin; check lipids today

## 2017-04-09 NOTE — Progress Notes (Signed)
BP 128/62   Pulse 83   Temp 97.6 F (36.4 C) (Oral)   Resp 14   Wt 175 lb (79.4 kg)   SpO2 94%   BMI 30.04 kg/m    Subjective:    Patient ID: [name], female    DOB: [birth year], 72 y.o.   MRN: [id number]  HPI: [name] is a 72 y.o. female  Chief Complaint  Patient presents with  . Medication Refill    HPI Patient is here as a new patient to me Has been seeing other provider in the clinic who has left our practice  She has a bump on her LEFT wrist; non-dominant hand; 6 months duration; getting bigger some days; not interfering with activities; only painful if really mashed or hitting against something  Anxiety; she takes this when she travels; she doesn't travel well in the motor home, takes just one or two to get from point A to point B; she goes out for 6 weeks or 3 months; has not had any for a week  Insomnia; has been on ambien, another controlled substance; taking that every night; trouble falling asleep; has not worked with a therapist  Hyperlipidemia; needs refills; no muscle aches; does eat fatty foods; room for improvement with diet; high chol runs in the family, both sides Lab Results  Component Value Date   CHOL 154 12/04/2016   HDL 56 12/04/2016   LDLCALC 67 05/31/2016   TRIG 180 (H) 12/04/2016   CHOLHDL 2.8 12/04/2016  LDL 72 in October   Depression screen [facility] 2/9 04/09/2017 12/04/2016 05/31/2016 01/10/2016 07/05/2015  Decreased Interest 0 0 0 0 0  Down, Depressed, Hopeless 2 0 0 0 0  PHQ - 2 Score 2 0 0 0 0  Altered sleeping 3 - - - -  Tired, decreased energy 0 - - - -  Change in appetite 1 - - - -  Feeling bad or failure about yourself  0 - - - -  Trouble concentrating 0 - - - -  Moving slowly or fidgety/restless 0 - - - -  Suicidal thoughts 0 - - - -  PHQ-9 Score 6 - - - -  Difficult doing work/chores Somewhat difficult - - - -    Relevant past medical, surgical, family and social history reviewed Past Medical History:  Diagnosis  Date  . Allergy   . Arthritis   . Asthma   . Cancer (HCC)    shoulder, nose skin CA- basal cell CA  . GERD (gastroesophageal reflux disease)    occasional  . Hyperlipidemia    Past Surgical History:  Procedure Laterality Date  . BREAST BIOPSY Right    core bx- neg  . COLONOSCOPY    . MOHS SURGERY    . TONSILLECTOMY    . VAGINAL HYSTERECTOMY     Family History  Problem Relation Age of Onset  . Colon cancer Paternal Uncle   . Rectal cancer Paternal Uncle   . Cancer Mother        lung   . Diabetes Father   . Parkinson's disease Father   . Alzheimer's disease Father   . COPD Sister   . Cancer Son        livcolon, liver stage 4  . Stroke Paternal Grandmother   . Cancer Paternal Grandfather        lung  . Down syndrome Sister   . Esophageal cancer Neg Hx   . Stomach cancer Neg Hx   .  Breast cancer Neg Hx    Social History   Tobacco Use  . Smoking status: Former Research scientist (life sciences)  . Smokeless tobacco: Never Used  Substance Use Topics  . Alcohol use: Yes    Comment: occasional  . Drug use: No    Interim medical history since last visit reviewed. Allergies and medications reviewed  Review of Systems Per HPI unless specifically indicated above     Objective:    BP 128/62   Pulse 83   Temp 97.6 F (36.4 C) (Oral)   Resp 14   Wt 175 lb (79.4 kg)   SpO2 94%   BMI 30.04 kg/m   Wt Readings from Last 3 Encounters:  04/09/17 175 lb (79.4 kg)  12/29/16 178 lb (80.7 kg)  12/04/16 177 lb 11.2 oz (80.6 kg)    Physical Exam  Constitutional: She appears well-developed and well-nourished. No distress.  HENT:  Head: Normocephalic and atraumatic.  Eyes: EOM are normal. No scleral icterus.  Neck: No thyromegaly present.  Cardiovascular: Normal rate, regular rhythm and normal heart sounds.  No murmur heard. Pulmonary/Chest: Effort normal and breath sounds normal. No respiratory distress. She has no wheezes.  Abdominal: Soft. Bowel sounds are normal. She exhibits no  distension.  Musculoskeletal: Normal range of motion. She exhibits no edema.  Cystic swelling over radial LEFT wrist; transilluminates  Neurological: She is alert. She exhibits normal muscle tone.  Skin: Skin is warm and dry. She is not diaphoretic. No pallor.  Psychiatric: She has a normal mood and affect. Her behavior is normal. Judgment and thought content normal.        Assessment & Plan:   Problem List Items Addressed This Visit      Other   Medication monitoring encounter    Check liver and kidneys      Relevant Orders   COMPLETE METABOLIC PANEL WITH GFR   Insomnia    Recommend CBT-I, process thoughts 2 hours BEFORE going in to the bedroom; avoiding caffeine or dark chocolate or tea, 10 hours befrore bed      Hyperlipidemia - Primary    Limit saturated fats; increase fiber and whole grains; refilled the statin; check lipids today      Relevant Medications   lovastatin (MEVACOR) 20 MG tablet   aspirin EC 81 MG tablet   Other Relevant Orders   Lipid panel   Anxiety as acute reaction to exceptional stress    Stop the benzo; start hydroxyzine, manage stress      Relevant Medications   hydrOXYzine (VISTARIL) 25 MG capsule    Other Visit Diagnoses    Screening for breast cancer       Relevant Orders   MM Digital Screening       Follow up plan: Return in about 6 months (around 10/07/2017) for fasting labs only, follow-up visit with Dr. Sanda Klein; Medicare Wellness visit when due.  An after-visit summary was printed and given to the patient at [facility].  Please see the patient instructions which may contain other information and recommendations beyond what is mentioned above in the assessment and plan.  Meds ordered this encounter  Medications  . lovastatin (MEVACOR) 20 MG tablet    Sig: Take 1 tablet (20 mg total) by mouth at bedtime.    Dispense:  90 tablet    Refill:  1  . hydrOXYzine (VISTARIL) 25 MG capsule    Sig: Take 1 capsule (25 mg total) by mouth every  8 (eight) hours as needed. Don't drive if  causes sedation/sleepiness    Dispense:  30 capsule    Refill:  1    Orders Placed This Encounter  Procedures  . MM Digital Screening  . COMPLETE METABOLIC PANEL WITH GFR  . Lipid panel

## 2017-04-11 ENCOUNTER — Other Ambulatory Visit: Payer: Self-pay | Admitting: Family Medicine

## 2017-04-11 DIAGNOSIS — N289 Disorder of kidney and ureter, unspecified: Secondary | ICD-10-CM

## 2017-04-11 NOTE — Progress Notes (Signed)
Check BMP in 4 weeks

## 2017-04-16 ENCOUNTER — Other Ambulatory Visit: Payer: Self-pay

## 2017-04-16 MED ORDER — HYDROXYZINE PAMOATE 25 MG PO CAPS: 25.0000 mg | ORAL_CAPSULE | Freq: Three times a day (TID) | ORAL | 1 refills | Status: DC | PRN

## 2017-04-16 NOTE — Telephone Encounter (Signed)
[facility] is asking Hydroxyzine be a 90 day prescription instead of a 30 day one. Please advise and send in a new prescription. Thanks.

## 2017-04-25 DIAGNOSIS — G4700 Insomnia, unspecified: Secondary | ICD-10-CM | POA: Diagnosis not present

## 2017-04-25 DIAGNOSIS — F411 Generalized anxiety disorder: Secondary | ICD-10-CM | POA: Diagnosis not present

## 2017-04-26 ENCOUNTER — Ambulatory Visit
Admission: RE | Admit: 2017-04-26 | Discharge: 2017-04-26 | Disposition: A | Discharge status: Home / Self Care | Payer: PPO | Source: Ambulatory Visit | Attending: Family Medicine | Admitting: Family Medicine

## 2017-04-26 DIAGNOSIS — Z1231 Encounter for screening mammogram for malignant neoplasm of breast: Secondary | ICD-10-CM | POA: Diagnosis not present

## 2017-04-26 DIAGNOSIS — Z1239 Encounter for other screening for malignant neoplasm of breast: Secondary | ICD-10-CM

## 2017-05-08 DIAGNOSIS — G4700 Insomnia, unspecified: Secondary | ICD-10-CM | POA: Diagnosis not present

## 2017-05-08 DIAGNOSIS — F411 Generalized anxiety disorder: Secondary | ICD-10-CM | POA: Diagnosis not present

## 2017-06-11 DIAGNOSIS — D485 Neoplasm of uncertain behavior of skin: Secondary | ICD-10-CM | POA: Diagnosis not present

## 2017-06-11 DIAGNOSIS — D229 Melanocytic nevi, unspecified: Secondary | ICD-10-CM | POA: Diagnosis not present

## 2017-06-11 DIAGNOSIS — L821 Other seborrheic keratosis: Secondary | ICD-10-CM | POA: Diagnosis not present

## 2017-06-11 DIAGNOSIS — Z85828 Personal history of other malignant neoplasm of skin: Secondary | ICD-10-CM | POA: Diagnosis not present

## 2017-06-11 DIAGNOSIS — Z1283 Encounter for screening for malignant neoplasm of skin: Secondary | ICD-10-CM | POA: Diagnosis not present

## 2017-06-11 DIAGNOSIS — L918 Other hypertrophic disorders of the skin: Secondary | ICD-10-CM | POA: Diagnosis not present

## 2017-06-11 DIAGNOSIS — D1800 Hemangioma unspecified site: Secondary | ICD-10-CM | POA: Diagnosis not present

## 2017-06-11 DIAGNOSIS — L814 Other melanin hyperpigmentation: Secondary | ICD-10-CM | POA: Diagnosis not present

## 2017-06-11 DIAGNOSIS — I788 Other diseases of capillaries: Secondary | ICD-10-CM | POA: Diagnosis not present

## 2017-06-11 DIAGNOSIS — L578 Other skin changes due to chronic exposure to nonionizing radiation: Secondary | ICD-10-CM | POA: Diagnosis not present

## 2017-06-13 DIAGNOSIS — G4700 Insomnia, unspecified: Secondary | ICD-10-CM | POA: Diagnosis not present

## 2017-06-13 DIAGNOSIS — F411 Generalized anxiety disorder: Secondary | ICD-10-CM | POA: Diagnosis not present

## 2017-06-18 ENCOUNTER — Encounter: Payer: Self-pay | Admitting: Family Medicine

## 2017-06-18 MED ORDER — HYDROXYZINE HCL 25 MG PO TABS: 25.0000 mg | ORAL_TABLET | Freq: Three times a day (TID) | ORAL | 0 refills | Status: DC | PRN

## 2017-06-18 NOTE — Telephone Encounter (Signed)
Can you call the pharmacy and find out if she has been through 180 pills already? That Rx should have lasted from April 16, 2017 until hopefully July; I do not expect her to take this around the clock, even if she will be gone for six weeks; I can send in a prescription later to wherever she'll be in six weeks if in the [place]

## 2017-07-11 ENCOUNTER — Other Ambulatory Visit: Payer: Self-pay | Admitting: Family Medicine

## 2017-07-11 NOTE — Telephone Encounter (Signed)
Refill request for hydroxyzine much too soon; denied

## 2017-07-16 ENCOUNTER — Other Ambulatory Visit: Payer: Self-pay | Admitting: Family Medicine

## 2017-08-14 DIAGNOSIS — G4700 Insomnia, unspecified: Secondary | ICD-10-CM | POA: Diagnosis not present

## 2017-08-14 DIAGNOSIS — F411 Generalized anxiety disorder: Secondary | ICD-10-CM | POA: Diagnosis not present

## 2017-08-31 ENCOUNTER — Encounter: Payer: Self-pay | Admitting: Family Medicine

## 2017-09-21 ENCOUNTER — Ambulatory Visit (INDEPENDENT_AMBULATORY_CARE_PROVIDER_SITE_OTHER): Payer: PPO

## 2017-09-21 ENCOUNTER — Encounter: Payer: PPO | Admitting: Family Medicine

## 2017-09-21 VITALS — BP 122/70 | HR 61 | Temp 97.5°F | Resp 12 | Ht 64.0 in | Wt 176.6 lb

## 2017-09-21 DIAGNOSIS — Z23 Encounter for immunization: Secondary | ICD-10-CM | POA: Diagnosis not present

## 2017-09-21 DIAGNOSIS — Z0000 Encounter for general adult medical examination without abnormal findings: Secondary | ICD-10-CM

## 2017-09-21 NOTE — Progress Notes (Signed)
Subjective:   [name] is a 72 y.o. female who presents for Medicare Annual (Subsequent) preventive examination.  Review of Systems:  N/A Cardiac Risk Factors include: advanced age (>37men, >75 women);dyslipidemia;obesity (BMI >30kg/m2);sedentary lifestyle     Objective:     Vitals: BP 122/70 (BP Location: Left Arm, Patient Position: Sitting, Cuff Size: Normal)   Pulse 61   Temp (!) 97.5 F (36.4 C) (Oral)   Resp 12   Ht 5\' 4"  (1.626 m)   Wt 176 lb 9.6 oz (80.1 kg)   SpO2 95%   BMI 30.31 kg/m   Body mass index is 30.31 kg/m.  Advanced Directives 09/21/2017 12/04/2016 05/31/2016 02/16/2016 01/10/2016 07/05/2015 12/09/2014  Does Patient Have a Medical Advance Directive? Yes Yes No Yes Yes No Yes  Type of Paramedic of [place];Living will [place];Living will - Living will Living will - [place];Living will  Copy of [place] in Chart? No - copy requested - - - - - -  Would patient like information on creating a medical advance directive? - - - - - No - patient declined information -    Tobacco Social History   Tobacco Use  Smoking Status Former Smoker  . Packs/day: 1.50  . Years: 20.00  . Pack years: 30.00  . Types: Cigarettes  . Last attempt to quit: 1993  . Years since quitting: 26.6  Smokeless Tobacco Never Used  Tobacco Comment   smoking cessation materials not required     Counseling given: No Comment: smoking cessation materials not required  Clinical Intake:  Pre-visit preparation completed: Yes  Pain : No/denies pain   BMI - recorded: 30.31 Nutritional Status: BMI > 30  Obese Nutritional Risks: None Diabetes: No  How often do you need to have someone help you when you read instructions, pamphlets, or other written materials from your doctor or pharmacy?: 1 - Never  Interpreter Needed?: No  Information entered by :: AEversole, LPN  Past Medical History:    Diagnosis Date  . Allergy   . Arthritis   . Asthma   . Cancer (HCC)    shoulder, nose skin CA- basal cell CA  . GERD (gastroesophageal reflux disease)    occasional  . Hyperlipidemia    Past Surgical History:  Procedure Laterality Date  . BREAST BIOPSY Right    core bx- neg  . COLONOSCOPY    . MOHS SURGERY    . TONSILLECTOMY    . VAGINAL HYSTERECTOMY     Family History  Problem Relation Age of Onset  . Colon cancer Paternal Uncle   . Rectal cancer Paternal Uncle   . Cancer Mother        lung   . Diabetes Father   . Parkinson's disease Father   . Alzheimer's disease Father   . Alcohol abuse Sister   . Heart disease Sister   . Cancer Son        livcolon, liver stage 4  . Stroke Paternal Grandmother   . Cancer Paternal Grandfather        lung  . Down syndrome Sister   . Esophageal cancer Neg Hx   . Stomach cancer Neg Hx   . Breast cancer Neg Hx    Social History   Socioeconomic History  . Marital status: Married    Spouse name: Jenny Reichmann  . Number of children: 2  . Years of education: Not on file  . Highest education  level: Bachelor's degree (e.g., BA, AB, BS)  Occupational History  . Occupation: Retired  Scientific laboratory technician  . Financial resource strain: Not hard at all  . Food insecurity:    Worry: Never true    Inability: Never true  . Transportation needs:    Medical: No    Non-medical: No  Tobacco Use  . Smoking status: Former Smoker    Packs/day: 1.50    Years: 20.00    Pack years: 30.00    Types: Cigarettes    Last attempt to quit: 1993    Years since quitting: 26.6  . Smokeless tobacco: Never Used  . Tobacco comment: smoking cessation materials not required  Substance and Sexual Activity  . Alcohol use: Yes    Comment: occasional  . Drug use: No  . Sexual activity: Not Currently  Lifestyle  . Physical activity:    Days per week: 4 days    Minutes per session: 40 min  . Stress: Not at all  Relationships  . Social connections:    Talks on phone:  Patient refused    Gets together: Patient refused    Attends religious service: Patient refused    Active member of club or organization: Patient refused    Attends meetings of clubs or organizations: Patient refused    Relationship status: Married  Other Topics Concern  . Not on file  Social History Narrative  . Not on file    Outpatient Encounter Medications as of 09/21/2017  Medication Sig  . aspirin EC 81 MG tablet Take 1 tablet (81 mg total) by mouth daily.  . calcium carbonate (OS-CAL - DOSED IN MG OF ELEMENTAL CALCIUM) 1250 (500 Ca) MG tablet Take 1 tablet by mouth daily with breakfast.  . cholecalciferol (VITAMIN D) 1000 units tablet Take 1,000 Units by mouth daily.  . famotidine (PEPCID) 10 MG tablet Take 1 tablet (10 mg total) by mouth 2 (two) times daily as needed for heartburn or indigestion.  Marland Kitchen GLUCOSAMINE-CHONDROIT-CALCIUM PO Take 1,000 mg by mouth daily.  Marland Kitchen lovastatin (MEVACOR) 20 MG tablet Take 1 tablet (20 mg total) by mouth at bedtime.  . Multiple Vitamin (MULTIVITAMIN) tablet Take 1 tablet by mouth daily.  . hydrOXYzine (ATARAX/VISTARIL) 25 MG tablet Take 1 tablet (25 mg total) by mouth every 8 (eight) hours as needed. (Patient not taking: Reported on 09/21/2017)   No facility-administered encounter medications on file as of 09/21/2017.     Activities of Daily Living In your present state of health, do you have any difficulty performing the following activities: 09/21/2017 04/09/2017  Hearing? N N  Comment denies hearing aids -  Vision? N N  Comment waers eyeglasses -  Difficulty concentrating or making decisions? Y N  Comment short term memory loss -  Walking or climbing stairs? Y N  Comment knee pain -  Dressing or bathing? N N  Doing errands, shopping? N N  Preparing Food and eating ? N -  Comment denies dentures -  Using the Toilet? N -  In the past six months, have you accidently leaked urine? Y -  Comment stress incontinence -  Do you have problems with loss  of bowel control? N -  Managing your Medications? N -  Managing your Finances? N -  Housekeeping or managing your Housekeeping? N -  Some recent data might be hidden    Patient Care Team: Lada, Satira Anis, MD as PCP - General (Family Medicine)    Assessment:   This is a  routine wellness examination for [name].  Exercise Activities and Dietary recommendations Current Exercise Habits: Home exercise routine, Type of exercise: walking, Time (Minutes): 45, Frequency (Times/Week): 4, Weekly Exercise (Minutes/Week): 180, Intensity: Mild, Exercise limited by: None identified  Goals    . DIET - INCREASE WATER INTAKE     Recommend to drink at least 6-8 8oz glasses of water per day.       Fall Risk Fall Risk  09/21/2017 04/09/2017 12/04/2016 05/31/2016 01/10/2016  Falls in the past year? Yes No No No No  Comment tripped over tree root when walking in the woods - - - -  Number falls in past yr: 1 - - - -  Injury with Fall? No - - - -  Risk for fall due to : Impaired vision - - - -  Risk for fall due to: Comment wears eyeglasses - - - -  Follow up Falls evaluation completed;Education provided;Falls prevention discussed - - - -   FALL RISK PREVENTION PERTAINING TO HOME: Is your home free of loose throw rugs in walkways, pet beds, electrical cords, etc? Yes Is there adequate lighting in your home to reduce risk of falls?  Yes Are there stairs in or around your home WITH handrails? Yes  ASSISTIVE DEVICES UTILIZED TO PREVENT FALLS: Use of a cane, walker or w/c? No Grab bars in the bathroom? No  Shower chair or a place to sit while bathing? Yes An elevated toilet seat or a handicapped toilet? Yes  Timed Get Up and Go Performed: Yes. Pt ambulated 10 feet within 12 sec. Gait stead-fast and without the use of an assistive device. No intervention required at this time. Fall risk prevention has been discussed.  Community Resource Referral:  Pt declined my offer to send [employer] Referral  to Care Guide for installation of grab bars in the shower.  Depression Screen PHQ 2/9 Scores 09/21/2017 04/09/2017 12/04/2016 05/31/2016  PHQ - 2 Score 0 2 0 0  PHQ- 9 Score 0 6 - -     Cognitive Function     6CIT Screen 09/21/2017  What Year? 0 points  What month? 0 points  What time? 0 points  Count back from 20 0 points  Months in reverse 0 points  Repeat phrase 0 points  Total Score 0    Immunization History  Administered Date(s) Administered  . Influenza, High Dose Seasonal PF 11/03/2016  . Influenza-Unspecified 10/14/2013, 11/15/2015, 11/03/2016  . Pneumococcal Conjugate-13 12/09/2014, 05/31/2016  . Pneumococcal Polysaccharide-23 09/21/2017    Qualifies for Shingles Vaccine? Yes. Due for Shingrix. Education has been provided regarding the importance of this vaccine. Pt has been advised to call insurance company to determine out of pocket expense. Advised may also receive vaccine at local pharmacy or Health Dept. Verbalized acceptance and understanding.  Screening Tests Health Maintenance  Topic Date Due  . INFLUENZA VACCINE  09/13/2017  . Hepatitis C Screening  04/09/2018 (Originally 1945-10-16)  . MAMMOGRAM  04/27/2018  . COLONOSCOPY  08/28/2018  . DEXA SCAN  Completed  . PNA vac Low Risk Adult  Completed  . TETANUS/TDAP  Discontinued    Cancer Screenings: Lung: Low Dose CT Chest recommended if Age 70-80 years, 30 pack-year currently smoking OR have quit w/in 15years. Patient does not qualify. Breast:  Up to date on Mammogram? Yes. Completed 04/26/17. Repeat every year   Up to date of Bone Density/Dexa? Yes. Completed 04/05/16. Results reflect osteopenia. Repeat every 2 years. Colorectal: Completed 08/27/13. Repeat every 5 years  Additional Screenings: Hepatitis C Screening: Declined    Plan:  I have personally reviewed and addressed the Medicare Annual Wellness questionnaire and have noted the following in the patient's chart:  A. Medical and social history B. Use  of alcohol, tobacco or illicit drugs  C. Current medications and supplements D. Functional ability and status E.  Nutritional status F.  Physical activity G. Advance directives H. List of other physicians I.  Hospitalizations, surgeries, and ER visits in previous 12 months J.  [place] such as hearing and vision if needed, cognitive and depression L. Referrals and appointments  In addition, I have reviewed and discussed with patient certain preventive protocols, quality metrics, and best practice recommendations. A written personalized care plan for preventive services as well as general preventive health recommendations were provided to patient.  See attached scanned questionnaire for additional information.   Signed,  Aleatha Borer, LPN Nurse Health Advisor

## 2017-09-21 NOTE — Patient Instructions (Signed)
[name] , Thank you for taking time to come for your Medicare Wellness Visit. I appreciate your ongoing commitment to your health goals. Please review the following plan we discussed and let me know if I can assist you in the future.   Screening recommendations/referrals: Colorectal Screening: Up to date Mammogram: Up to date Bone Density: Upto date  Vision and Dental Exams: Recommended annual ophthalmology exams for early detection of glaucoma and other disorders of the eye Recommended annual dental exams for proper oral hygiene  Vaccinations: Influenza vaccine: Up to date Pneumococcal vaccine: Up to date Tdap vaccine: CONTRAINDICATED Shingles vaccine: Please call your insurance company to determine your out of pocket expense for the Shingrix vaccine. You may also receive this vaccine at your local pharmacy or Health Dept.    Advanced directives: Please bring a copy of your POA (Power of Attorney) and/or Living Will to your next appointment.  Goals: Recommend to drink at least 6-8 8oz glasses of water per day.  Next appointment: Please schedule your Annual Wellness Visit with your Nurse Health Advisor in one year.  Preventive Care 72 Years and Older, Female Preventive care refers to lifestyle choices and visits with your health care provider that can promote health and wellness. What does preventive care include?  A yearly physical exam. This is also called an annual well check.  Dental exams once or twice a year.  Routine eye exams. Ask your health care provider how often you should have your eyes checked.  Personal lifestyle choices, including:  Daily care of your teeth and gums.  Regular physical activity.  Eating a healthy diet.  Avoiding tobacco and drug use.  Limiting alcohol use.  Practicing safe sex.  Taking low-dose aspirin every day.  Taking vitamin and mineral supplements as recommended by your health care provider. What happens during an annual well  check? The services and screenings done by your health care provider during your annual well check will depend on your age, overall health, lifestyle risk factors, and family history of disease. Counseling  Your health care provider may ask you questions about your:  Alcohol use.  Tobacco use.  Drug use.  Emotional well-being.  Home and relationship well-being.  Sexual activity.  Eating habits.  History of falls.  Memory and ability to understand (cognition).  Work and work Statistician.  Reproductive health. Screening  You may have the following tests or measurements:  Height, weight, and BMI.  Blood pressure.  Lipid and cholesterol levels. These may be checked every 5 years, or more frequently if you are over 30 years old.  Skin check.  Lung cancer screening. You may have this screening every year starting at age 25 if you have a 30-pack-year history of smoking and currently smoke or have quit within the past 15 years.  Fecal occult blood test (FOBT) of the stool. You may have this test every year starting at age 56.  Flexible sigmoidoscopy or colonoscopy. You may have a sigmoidoscopy every 5 years or a colonoscopy every 10 years starting at age 72.  Hepatitis C blood test.  Hepatitis B blood test.  Sexually transmitted disease (STD) testing.  Diabetes screening. This is done by checking your blood sugar (glucose) after you have not eaten for a while (fasting). You may have this done every 1-3 years.  Bone density scan. This is done to screen for osteoporosis. You may have this done starting at age 58.  Mammogram. This may be done every 1-2 years. Talk to  your health care provider about how often you should have regular mammograms. Talk with your health care provider about your test results, treatment options, and if necessary, the need for more tests. Vaccines  Your health care provider may recommend certain vaccines, such as:  Influenza vaccine. This is  recommended every year.  Tetanus, diphtheria, and acellular pertussis (Tdap, Td) vaccine. You may need a Td booster every 10 years.  Zoster vaccine. You may need this after age 72.  Pneumococcal 13-valent conjugate (PCV13) vaccine. One dose is recommended after age 72.  Pneumococcal polysaccharide (PPSV23) vaccine. One dose is recommended after age 72. Talk to your health care provider about which screenings and vaccines you need and how often you need them. This information is not intended to replace advice given to you by your health care provider. Make sure you discuss any questions you have with your health care provider. Document Released: 02/26/2015 Document Revised: 10/20/2015 Document Reviewed: 12/01/2014 Elsevier Interactive Patient Education  2017 [place] Prevention in the Home Falls can cause injuries. They can happen to people of all ages. There are many things you can do to make your home safe and to help prevent falls. What can I do on the outside of my home?  Regularly fix the edges of walkways and driveways and fix any cracks.  Remove anything that might make you trip as you walk through a door, such as a raised step or threshold.  Trim any bushes or trees on the path to your home.  Use bright outdoor lighting.  Clear any walking paths of anything that might make someone trip, such as rocks or tools.  Regularly check to see if handrails are loose or broken. Make sure that both sides of any steps have handrails.  Any raised decks and porches should have guardrails on the edges.  Have any leaves, snow, or ice cleared regularly.  Use sand or salt on walking paths during winter.  Clean up any spills in your garage right away. This includes oil or grease spills. What can I do in the bathroom?  Use night lights.  Install grab bars by the toilet and in the tub and shower. Do not use towel bars as grab bars.  Use non-skid mats or decals in the tub or  shower.  If you need to sit down in the shower, use a plastic, non-slip stool.  Keep the floor dry. Clean up any water that spills on the floor as soon as it happens.  Remove soap buildup in the tub or shower regularly.  Attach bath mats securely with double-sided non-slip rug tape.  Do not have throw rugs and other things on the floor that can make you trip. What can I do in the bedroom?  Use night lights.  Make sure that you have a light by your bed that is easy to reach.  Do not use any sheets or blankets that are too big for your bed. They should not hang down onto the floor.  Have a firm chair that has side arms. You can use this for support while you get dressed.  Do not have throw rugs and other things on the floor that can make you trip. What can I do in the kitchen?  Clean up any spills right away.  Avoid walking on wet floors.  Keep items that you use a lot in easy-to-reach places.  If you need to reach something above you, use a strong step stool that has  a grab bar.  Keep electrical cords out of the way.  Do not use floor polish or wax that makes floors slippery. If you must use wax, use non-skid floor wax.  Do not have throw rugs and other things on the floor that can make you trip. What can I do with my stairs?  Do not leave any items on the stairs.  Make sure that there are handrails on both sides of the stairs and use them. Fix handrails that are broken or loose. Make sure that handrails are as long as the stairways.  Check any carpeting to make sure that it is firmly attached to the stairs. Fix any carpet that is loose or worn.  Avoid having throw rugs at the top or bottom of the stairs. If you do have throw rugs, attach them to the floor with carpet tape.  Make sure that you have a light switch at the top of the stairs and the bottom of the stairs. If you do not have them, ask someone to add them for you. What else can I do to help prevent  falls?  Wear shoes that:  Do not have high heels.  Have rubber bottoms.  Are comfortable and fit you well.  Are closed at the toe. Do not wear sandals.  If you use a stepladder:  Make sure that it is fully opened. Do not climb a closed stepladder.  Make sure that both sides of the stepladder are locked into place.  Ask someone to hold it for you, if possible.  Clearly mark and make sure that you can see:  Any grab bars or handrails.  First and last steps.  Where the edge of each step is.  Use tools that help you move around (mobility aids) if they are needed. These include:  Canes.  Walkers.  Scooters.  Crutches.  Turn on the lights when you go into a dark area. Replace any light bulbs as soon as they burn out.  Set up your furniture so you have a clear path. Avoid moving your furniture around.  If any of your floors are uneven, fix them.  If there are any pets around you, be aware of where they are.  Review your medicines with your doctor. Some medicines can make you feel dizzy. This can increase your chance of falling. Ask your doctor what other things that you can do to help prevent falls. This information is not intended to replace advice given to you by your health care provider. Make sure you discuss any questions you have with your health care provider. Document Released: 11/26/2008 Document Revised: 07/08/2015 Document Reviewed: 03/06/2014 Elsevier Interactive Patient Education  2017 [employer].

## 2017-10-02 ENCOUNTER — Encounter: Payer: Self-pay | Admitting: Family Medicine

## 2017-10-05 ENCOUNTER — Encounter: Payer: PPO | Admitting: Family Medicine

## 2017-10-26 ENCOUNTER — Encounter: Payer: PPO | Admitting: Family Medicine

## 2017-11-26 DIAGNOSIS — H524 Presbyopia: Secondary | ICD-10-CM | POA: Diagnosis not present

## 2017-11-27 ENCOUNTER — Encounter: Payer: Self-pay | Admitting: Nurse Practitioner

## 2017-11-27 ENCOUNTER — Ambulatory Visit (INDEPENDENT_AMBULATORY_CARE_PROVIDER_SITE_OTHER): Payer: PPO | Admitting: Nurse Practitioner

## 2017-11-27 VITALS — BP 116/64 | HR 70 | Temp 98.0°F | Resp 12 | Ht 65.0 in | Wt 175.2 lb

## 2017-11-27 DIAGNOSIS — F419 Anxiety disorder, unspecified: Secondary | ICD-10-CM | POA: Diagnosis not present

## 2017-11-27 DIAGNOSIS — M8580 Other specified disorders of bone density and structure, unspecified site: Secondary | ICD-10-CM | POA: Insufficient documentation

## 2017-11-27 DIAGNOSIS — M85859 Other specified disorders of bone density and structure, unspecified thigh: Secondary | ICD-10-CM

## 2017-11-27 DIAGNOSIS — E785 Hyperlipidemia, unspecified: Secondary | ICD-10-CM | POA: Diagnosis not present

## 2017-11-27 DIAGNOSIS — Z5181 Encounter for therapeutic drug level monitoring: Secondary | ICD-10-CM

## 2017-11-27 DIAGNOSIS — F4321 Adjustment disorder with depressed mood: Secondary | ICD-10-CM

## 2017-11-27 DIAGNOSIS — K219 Gastro-esophageal reflux disease without esophagitis: Secondary | ICD-10-CM | POA: Diagnosis not present

## 2017-11-27 LAB — COMPLETE METABOLIC PANEL WITH GFR
AG RATIO: 2.1 (calc) (ref 1.0–2.5)
ALT: 14 U/L (ref 6–29)
AST: 16 U/L (ref 10–35)
Albumin: 4.1 g/dL (ref 3.6–5.1)
Alkaline phosphatase (APISO): 57 U/L (ref 33–130)
BILIRUBIN TOTAL: 0.3 mg/dL (ref 0.2–1.2)
BUN: 14 mg/dL (ref 7–25)
CHLORIDE: 106 mmol/L (ref 98–110)
CO2: 28 mmol/L (ref 20–32)
Calcium: 9.1 mg/dL (ref 8.6–10.4)
Creat: 0.93 mg/dL (ref 0.60–0.93)
GFR, EST AFRICAN AMERICAN: 71 mL/min/{1.73_m2} (ref >=60)
GFR, EST NON AFRICAN AMERICAN: 61 mL/min/{1.73_m2} (ref >=60)
GLOBULIN: 2 g/dL (ref 1.9–3.7)
Glucose, Bld: 81 mg/dL (ref 65–139)
Potassium: 4.1 mmol/L (ref 3.5–5.3)
Sodium: 141 mmol/L (ref 135–146)
Total Protein: 6.1 g/dL (ref 6.1–8.1)

## 2017-11-27 MED ORDER — LOVASTATIN 20 MG PO TABS: 20.0000 mg | ORAL_TABLET | Freq: Every day | ORAL | 1 refills | Status: DC

## 2017-11-27 MED ORDER — HYDROXYZINE HCL 25 MG PO TABS
25.0000 mg | ORAL_TABLET | Freq: Three times a day (TID) | ORAL | 0 refills | Status: DC | PRN
Start: 2017-11-27 — End: 2017-12-20

## 2017-11-27 NOTE — Patient Instructions (Addendum)
General recommendations for prevention of osteoporosis: avoid smoking and heavy alcohol consumption, do weight-bearing exercises regularly.  An optimal diet for bone health involves making sure you get enough protein and calories as well as plenty of calcium and vitamin D, which are essential in helping to maintain proper bone formation and density. Postmenopausal women should consume 1200 mg of calcium per day and 800 IU of Vitamin D per day (total of diet plus supplements). The main dietary sources of calcium include milk and other dairy products, such as cottage cheese, yogurt, and hard cheese, and green vegetables, such as kale and broccoli. Sunlight exposure is recommended if tolerated for 30 minutes 5 times a day.   Good cholesterol, also called high-density lipoprotein (HDL) removes extra cholesterol and plaque buildup in your arteries and then sends it to your liver to get rid of and helps reduce your risk of heart disease, heart attack, and stroke.Foods that increase HDL: beans and legumes, whole grains, high-fiber fruits:prunes, apples, and pears; fatty fish- salmon, tuna, sardines; nuts, olive oil   Sleep Hygiene Tips 1) Get regular. One of the best ways to train your body to sleep well is to go to bed and get up at more or less the same time every day, even on weekends and days off! This regular rhythm will make you feel better and will give your body something to work from. 2) Sleep when sleepy. Only try to sleep when you actually feel tired or sleepy, rather than spending too much time awake in bed. 3) Get up & try again. If you haven't been able to get to sleep after about 20 minutes or more, get up and do something calming or boring until you feel sleepy, then return to bed and try again. Sit quietly on the couch with the lights off (bright light will tell your brain that it is time to wake up), or read something boring like the phone book. Avoid doing anything that is too  stimulating or interesting, as this will wake you up even more. 4) Avoid caffeine & nicotine. It is best to avoid consuming any caffeine (in coffee, tea, cola drinks, chocolate, and some medications) or nicotine (cigarettes) for at least 4-6 hours before going to bed. These substances act as stimulants and interfere with the ability to fall asleep 5) Avoid alcohol. It is also best to avoid alcohol for at least 4-6 hours before going to bed. Many people believe that alcohol is relaxing and helps them to get to sleep at first, but it actually interrupts the quality of sleep. 6) Bed is for sleeping. Try not to use your bed for anything other than sleeping and sex, so that your body comes to associate bed with sleep. If you use bed as a place to watch TV, eat, read, work on your laptop, pay bills, and other things, your body will not learn this Connection. 7) No naps. It is best to avoid taking naps during the day, to make sure that you are tired at bedtime. If you can't make it through the day without a nap, make sure it is for less than an hour and before 3pm. 8) Sleep rituals. You can develop your own rituals of things to remind your body that it is time to sleep - some people find it useful to do relaxing stretches or breathing exercises for 15 minutes before bed each night, or sit calmly with a cup of caffeine-free tea. 9) Bathtime. Having a hot bath 1-2 hours  before bedtime can be useful, as it will raise your body temperature, causing you to feel sleepy as your body temperature drops again. Research shows that sleepiness is associated with a drop in body temperature. 10) No clock-watching. Many people who struggle with sleep tend to watch the clock too much. Frequently checking the clock during the night can wake you up (especially if you turn on the light to read the time) and reinforces negative thoughts such as "Oh no, look how late it is, I'll never get to sleep" or  "it's so early, I have only slept for 5 hours, this is terrible." 11) Use a sleep diary. This worksheet can be a useful way of making sure you have the right facts about your sleep, rather than making assumptions. Because a diary involves watching the clock (see point 10) it is a good idea to only use it for two weeks to get an idea of what is going and then perhaps two months down the track to see how you are progressing. 12) Exercise. Regular exercise is a good idea to help with good sleep, but try not to do strenuous exercise in the 4 hours before bedtime. Morning walks are a great way to start the day feeling refreshed! 13) Eat right. A healthy, balanced diet will help you to sleep well, but timing is important. Some people find that a very empty stomach at bedtime is distracting, so it can be useful to have a light snack, but a heavy meal soon before bed can also interrupt sleep. Some people recommend a warm glass of milk, which contains tryptophan, which acts as a natural sleep inducer. 14) The right space. It is very important that your bed and bedroom are quiet and comfortable for sleeping. A cooler room with enough blankets to stay warm is best, and make sure you have curtains or an eyemask to block out early morning light and earplugs if there is noise outside your room. 15) Keep daytime routine the same. Even if you have a bad night sleep and are tired it is important that you try to keep your daytime activities the same as you had planned. That is, don't avoid activities because you feel tired. This can reinforce the insomnia.

## 2017-11-27 NOTE — Progress Notes (Signed)
Name: [name]   MRN: [id number]    DOB: September 17, 1945   Date:11/27/2017       Progress Note  Subjective  Chief Complaint  Chief Complaint  Patient presents with  . Annual Exam  . Medication Refill    HPI  Hyperlipidemia Lovastatin 20 mg at night  Lab Results  Component Value Date   CHOL 180 04/09/2017   HDL 48 (L) 04/09/2017   LDLCALC 91 04/09/2017   TRIG 287 (H) 04/09/2017   CHOLHDL 3.8 04/09/2017    GERD Famotidine PRN- states lately needs it every day more stress and poor diet recently with husbands illness. Appetite of husband has increased.   Anxiety uses hydroxyzine to help with acute anxiety- long travels and then with acute stress with husbands illness  Depression screen [facility] 2/9 11/27/2017 09/21/2017 04/09/2017  Decreased Interest 2 0 0  Down, Depressed, Hopeless 2 0 2  PHQ - 2 Score 4 0 2  Altered sleeping 3 0 3  Tired, decreased energy 3 0 0  Change in appetite 2 0 1  Feeling bad or failure about yourself  1 0 0  Trouble concentrating 0 0 0  Moving slowly or fidgety/restless 0 0 0  Suicidal thoughts 0 0 0  PHQ-9 Score 13 0 6  Difficult doing work/chores Somewhat difficult Not difficult at all Somewhat difficult   No flowsheet data found.       Patient Active Problem List   Diagnosis Date Noted  . Medication monitoring encounter 04/09/2017  . Well woman exam without gynecological exam 02/16/2016  . Hyperlipidemia 12/09/2014  . Insomnia 12/09/2014  . Anxiety as acute reaction to exceptional stress 12/09/2014    Past Medical History:  Diagnosis Date  . Allergy   . Arthritis   . Asthma   . Cancer (HCC)    shoulder, nose skin CA- basal cell CA  . GERD (gastroesophageal reflux disease)    occasional  . Hyperlipidemia     Past Surgical History:  Procedure Laterality Date  . BREAST BIOPSY Right    core bx- neg  . COLONOSCOPY    . MOHS SURGERY    . TONSILLECTOMY    . VAGINAL HYSTERECTOMY      Social History   Tobacco Use  .  Smoking status: Former Smoker    Packs/day: 1.50    Years: 20.00    Pack years: 30.00    Types: Cigarettes    Last attempt to quit: 1993    Years since quitting: 26.8  . Smokeless tobacco: Never Used  . Tobacco comment: smoking cessation materials not required  Substance Use Topics  . Alcohol use: Yes    Comment: occasional     Current Outpatient Medications:  .  aspirin EC 81 MG tablet, Take 1 tablet (81 mg total) by mouth daily., Disp: , Rfl:  .  calcium carbonate (OS-CAL - DOSED IN MG OF ELEMENTAL CALCIUM) 1250 (500 Ca) MG tablet, Take 1 tablet by mouth daily with breakfast., Disp: , Rfl:  .  cholecalciferol (VITAMIN D) 1000 units tablet, Take 1,000 Units by mouth daily., Disp: , Rfl:  .  famotidine (PEPCID) 10 MG tablet, Take 1 tablet (10 mg total) by mouth 2 (two) times daily as needed for heartburn or indigestion., Disp: , Rfl:  .  GLUCOSAMINE-CHONDROIT-CALCIUM PO, Take 1,000 mg by mouth daily., Disp: , Rfl:  .  hydrOXYzine (ATARAX/VISTARIL) 25 MG tablet, Take 1 tablet (25 mg total) by mouth every 8 (eight) hours as needed., Disp:  120 tablet, Rfl: 0 .  lovastatin (MEVACOR) 20 MG tablet, Take 1 tablet (20 mg total) by mouth at bedtime., Disp: 90 tablet, Rfl: 1 .  Multiple Vitamin (MULTIVITAMIN) tablet, Take 1 tablet by mouth daily., Disp: , Rfl:   Allergies  Allergen Reactions  . Tetanus Toxoids Shortness Of Breath  . Bromsulphalein   . Sulfa Antibiotics     Severe vomiting    Review of Systems  Constitutional: Positive for malaise/fatigue. Negative for chills and fever.  HENT: Negative for congestion, ear pain, sinus pain and sore throat.   Respiratory: Negative for cough and shortness of breath.   Cardiovascular: Negative for chest pain and palpitations.  Gastrointestinal: Negative for abdominal pain, blood in stool, constipation, diarrhea, nausea and vomiting.  Genitourinary: Negative for dysuria and hematuria.  Musculoskeletal: Positive for joint pain (baseline).  Negative for falls and myalgias.  Skin: Negative for rash.  Neurological: Positive for headaches (resolves with acetaminophen). Negative for dizziness and weakness.  Psychiatric/Behavioral: The patient is nervous/anxious and has insomnia.     No other specific complaints in a complete review of systems (except as listed in HPI above).  Objective  Vitals:   11/27/17 1331  BP: 116/64  Pulse: 70  Resp: 12  Temp: 98 F (36.7 C)  TempSrc: Oral  SpO2: 98%  Weight: 175 lb 3.2 oz (79.5 kg)  Height: 5\' 5"  (1.651 m)    Body mass index is 29.15 kg/m.  Nursing Note and Vital Signs reviewed.  Physical Exam  Constitutional: She is oriented to person, place, and time. She appears well-developed and well-nourished.  HENT:  Head: Normocephalic and atraumatic.  Eyes: Conjunctivae are normal.  Neck: Normal range of motion. Neck supple. No thyromegaly present.  Cardiovascular: Normal rate, regular rhythm, normal heart sounds and intact distal pulses.  No lower leg edema noted  Pulmonary/Chest: Effort normal and breath sounds normal.  Abdominal: Soft. Bowel sounds are normal.  Musculoskeletal: Normal range of motion. She exhibits no deformity.  Neurological: She is alert and oriented to person, place, and time. No sensory deficit. Coordination normal.  Skin: Skin is warm and dry. She is not diaphoretic. No erythema.  Psychiatric: She has a normal mood and affect. Her behavior is normal. Judgment and thought content normal.    No results found for this or any previous visit (from the past 48 hour(s)).  Assessment & Plan  1. Hyperlipidemia, unspecified hyperlipidemia type Continue medications, discussed ways to increase HDL - lovastatin (MEVACOR) 20 MG tablet; Take 1 tablet (20 mg total) by mouth at bedtime.  Dispense: 90 tablet; Refill: 1  2. Gastroesophageal reflux disease without esophagitis Diet, wedge, famotidine PRN   3. Osteopenia of neck of femur, unspecified  laterality Vitamin D, calcium, weight bearing   4. Anxiety Discussed considering daily medication, counseling, self-care - hydrOXYzine (ATARAX/VISTARIL) 25 MG tablet; Take 1 tablet (25 mg total) by mouth every 8 (eight) hours as needed.  Dispense: 120 tablet; Refill: 0  5. Situational depression Due to husbands unknown illness.  Discussed considering daily medication, counseling, self-care  6. Medication monitoring encounter - COMPLETE METABOLIC PANEL WITH GFR

## 2017-12-20 ENCOUNTER — Other Ambulatory Visit: Payer: Self-pay | Admitting: Nurse Practitioner

## 2017-12-20 DIAGNOSIS — F419 Anxiety disorder, unspecified: Secondary | ICD-10-CM

## 2017-12-21 NOTE — Telephone Encounter (Signed)
Received request from pharmacy for 270 tablets of hydroxyzine. Please clarify order request- this medication was supposed to be used sparingly for increased anxiety with travel. If requiring daily use will need to switch to a different medication. I am happy to send a maximum dose of 90 tablets for use over one year if that is cheaper for her.

## 2017-12-21 NOTE — Telephone Encounter (Signed)
She states that she has been needing the medication on a daily basis. She is having a difficult time coping with her husband being so sick. She needs it while dealing with his sickness.

## 2018-01-11 ENCOUNTER — Encounter: Payer: Self-pay | Admitting: Family Medicine

## 2018-01-11 ENCOUNTER — Ambulatory Visit (INDEPENDENT_AMBULATORY_CARE_PROVIDER_SITE_OTHER): Payer: PPO | Admitting: Family Medicine

## 2018-01-11 VITALS — BP 118/76 | HR 77 | Temp 98.0°F | Ht 64.0 in | Wt 175.6 lb

## 2018-01-11 DIAGNOSIS — R3911 Hesitancy of micturition: Secondary | ICD-10-CM

## 2018-01-11 DIAGNOSIS — R300 Dysuria: Secondary | ICD-10-CM | POA: Diagnosis not present

## 2018-01-11 LAB — POCT URINALYSIS DIPSTICK
Bilirubin, UA: NEGATIVE
Blood, UA: POSITIVE
Glucose, UA: NEGATIVE
KETONES UA: NEGATIVE
Nitrite, UA: POSITIVE
Protein, UA: NEGATIVE
SPEC GRAV UA: 1.01 (ref 1.010–1.025)
Urobilinogen, UA: NEGATIVE E.U./dL — AB
pH, UA: 5 (ref 5.0–8.0)

## 2018-01-11 MED ORDER — PHENAZOPYRIDINE HCL 100 MG PO TABS: 100.0000 mg | ORAL_TABLET | Freq: Three times a day (TID) | ORAL | 0 refills | Status: DC | PRN

## 2018-01-11 MED ORDER — NITROFURANTOIN MONOHYD MACRO 100 MG PO CAPS: 100.0000 mg | ORAL_CAPSULE | Freq: Two times a day (BID) | ORAL | 0 refills | Status: DC

## 2018-01-11 NOTE — Progress Notes (Signed)
Name: [name]   MRN: [id number]    DOB: 26-Aug-1945   Date:01/11/2018       Progress Note  Subjective  Chief Complaint  Chief Complaint  Patient presents with  . Urinary Tract Infection    Onset Wednesday night. Odor, frequency, decreased output, burning and chills    HPI  Dysuria: she had similar episodes in the past treated with Macrobid back in 2017 and 2018. She has noticed acute onset of urine odor, hesitancy, dysuria and when voiding has chills because of the pain. No hematuria, she has low back pain but no flank pain, no fever, nausea and vomiting.   Patient Active Problem List   Diagnosis Date Noted  . GERD (gastroesophageal reflux disease) 11/27/2017  . Osteopenia 11/27/2017  . Medication monitoring encounter 04/09/2017  . Well woman exam without gynecological exam 02/16/2016  . Hyperlipidemia 12/09/2014  . Insomnia 12/09/2014  . Anxiety as acute reaction to exceptional stress 12/09/2014    Social History   Tobacco Use  . Smoking status: Former Smoker    Packs/day: 1.50    Years: 20.00    Pack years: 30.00    Types: Cigarettes    Last attempt to quit: 1993    Years since quitting: 26.9  . Smokeless tobacco: Never Used  . Tobacco comment: smoking cessation materials not required  Substance Use Topics  . Alcohol use: Yes    Comment: occasional     Current Outpatient Medications:  .  aspirin EC 81 MG tablet, Take 1 tablet (81 mg total) by mouth daily., Disp: , Rfl:  .  calcium carbonate (OS-CAL - DOSED IN MG OF ELEMENTAL CALCIUM) 1250 (500 Ca) MG tablet, Take 1 tablet by mouth daily with breakfast., Disp: , Rfl:  .  cholecalciferol (VITAMIN D) 1000 units tablet, Take 1,000 Units by mouth daily., Disp: , Rfl:  .  famotidine (PEPCID) 10 MG tablet, Take 1 tablet (10 mg total) by mouth 2 (two) times daily as needed for heartburn or indigestion., Disp: , Rfl:  .  GLUCOSAMINE-CHONDROIT-CALCIUM PO, Take 1,000 mg by mouth daily., Disp: , Rfl:  .  hydrOXYzine  (ATARAX/VISTARIL) 25 MG tablet, TAKE 1 TABLET (25 MG TOTAL) BY MOUTH EVERY 8 (EIGHT) HOURS AS NEEDED., Disp: 90 tablet, Rfl: 1 .  lovastatin (MEVACOR) 20 MG tablet, Take 1 tablet (20 mg total) by mouth at bedtime., Disp: 90 tablet, Rfl: 1 .  Multiple Vitamin (MULTIVITAMIN) tablet, Take 1 tablet by mouth daily., Disp: , Rfl:   Allergies  Allergen Reactions  . Tetanus Toxoids Shortness Of Breath  . Bromsulphalein   . Sulfa Antibiotics     Severe vomiting    ROS  Ten systems reviewed and is negative except as mentioned in HPI   Objective  Vitals:   01/11/18 1026  BP: 118/76  Pulse: 77  Temp: 98 F (36.7 C)  SpO2: 96%  Weight: 175 lb 9.6 oz (79.7 kg)  Height: 5\' 4"  (1.626 m)    Body mass index is 30.14 kg/m.    Physical Exam  Constitutional: Patient appears well-developed and well-nourished. Obese. No distress.  HEENT: head atraumatic, normocephalic, pupils equal and reactive to light, neck supple, throat within normal limits Cardiovascular: Normal rate, regular rhythm and normal heart sounds.  No murmur heard. No BLE edema. Pulmonary/Chest: Effort normal and breath sounds normal. No respiratory distress. Abdominal: Soft.  There is no tenderness. Psychiatric: Patient has a normal mood and affect. behavior is normal. Judgment and thought content normal.  Recent Results (from the past 2160 hour(s))  COMPLETE METABOLIC PANEL WITH GFR     Status: None   Collection Time: 11/27/17  2:36 PM  Result Value Ref Range   Glucose, Bld 81 65 - 139 mg/dL    Comment: .        Non-fasting reference interval .    BUN 14 7 - 25 mg/dL   Creat 0.93 0.60 - 0.93 mg/dL    Comment: For patients >55 years of age, the reference limit for Creatinine is approximately 13% higher for people identified as African-American. .    GFR, Est Non African American 61 > OR = 60 mL/min/1.39m2   GFR, Est African American 71 > OR = 60 mL/min/1.12m2   BUN/Creatinine Ratio NOT APPLICABLE 6 - 22 (calc)    Sodium 141 135 - 146 mmol/L   Potassium 4.1 3.5 - 5.3 mmol/L   Chloride 106 98 - 110 mmol/L   CO2 28 20 - 32 mmol/L   Calcium 9.1 8.6 - 10.4 mg/dL   Total Protein 6.1 6.1 - 8.1 g/dL   Albumin 4.1 3.6 - 5.1 g/dL   Globulin 2.0 1.9 - 3.7 g/dL (calc)   AG Ratio 2.1 1.0 - 2.5 (calc)   Total Bilirubin 0.3 0.2 - 1.2 mg/dL   Alkaline phosphatase (APISO) 57 33 - 130 U/L   AST 16 10 - 35 U/L   ALT 14 6 - 29 U/L     Assessment & Plan  1. Hesitancy of micturition  - POCT urinalysis dipstick - nitrofurantoin, macrocrystal-monohydrate, (MACROBID) 100 MG capsule; Take 1 capsule (100 mg total) by mouth 2 (two) times daily.  Dispense: 10 capsule; Refill: 0 - Urine Culture - phenazopyridine (PYRIDIUM) 100 MG tablet; Take 1 tablet (100 mg total) by mouth 3 (three) times daily as needed for pain.  Dispense: 6 tablet; Refill: 0  2. Dysuria  - POCT urinalysis dipstick - nitrofurantoin, macrocrystal-monohydrate, (MACROBID) 100 MG capsule; Take 1 capsule (100 mg total) by mouth 2 (two) times daily.  Dispense: 10 capsule; Refill: 0 - Urine Culture   -Red flags and when to present for emergency care or RTC including fever >101.38F, chest pain, shortness of breath, new/worsening/un-resolving symptoms reviewed with patient at time of visit. Follow up and care instructions discussed and provided in AVS.

## 2018-01-12 LAB — URINE CULTURE: CULTURE: NO GROWTH

## 2018-01-18 ENCOUNTER — Other Ambulatory Visit: Payer: Self-pay | Admitting: Nurse Practitioner

## 2018-01-18 DIAGNOSIS — F419 Anxiety disorder, unspecified: Secondary | ICD-10-CM

## 2018-03-15 ENCOUNTER — Other Ambulatory Visit: Payer: Self-pay | Admitting: Family Medicine

## 2018-03-15 DIAGNOSIS — Z1231 Encounter for screening mammogram for malignant neoplasm of breast: Secondary | ICD-10-CM

## 2018-04-29 ENCOUNTER — Other Ambulatory Visit: Payer: Self-pay

## 2018-04-29 ENCOUNTER — Ambulatory Visit
Admission: RE | Admit: 2018-04-29 | Discharge: 2018-04-29 | Disposition: A | Discharge status: Home / Self Care | Payer: PPO | Source: Ambulatory Visit | Attending: Family Medicine | Admitting: Family Medicine

## 2018-04-29 DIAGNOSIS — Z1231 Encounter for screening mammogram for malignant neoplasm of breast: Secondary | ICD-10-CM | POA: Diagnosis not present

## 2018-05-09 ENCOUNTER — Telehealth: Payer: PPO | Admitting: Family

## 2018-05-09 ENCOUNTER — Ambulatory Visit: Payer: Self-pay | Admitting: *Deleted

## 2018-05-09 DIAGNOSIS — R300 Dysuria: Secondary | ICD-10-CM | POA: Diagnosis not present

## 2018-05-09 MED ORDER — CEPHALEXIN 500 MG PO CAPS: 500.0000 mg | ORAL_CAPSULE | Freq: Two times a day (BID) | ORAL | 0 refills | Status: DC

## 2018-05-09 NOTE — Telephone Encounter (Signed)
Summary: UTI    Pt called and left a voicemail that she is having a UTI. Pt did not lave details. Please advise      Call to patient- she reports she did E visit and she is going to pick up Rx now.

## 2018-05-09 NOTE — Progress Notes (Signed)

## 2018-05-29 ENCOUNTER — Other Ambulatory Visit: Payer: Self-pay

## 2018-05-29 ENCOUNTER — Telehealth: Payer: Self-pay | Admitting: Family Medicine

## 2018-05-29 ENCOUNTER — Ambulatory Visit (INDEPENDENT_AMBULATORY_CARE_PROVIDER_SITE_OTHER): Payer: PPO | Admitting: Family Medicine

## 2018-05-29 ENCOUNTER — Encounter: Payer: Self-pay | Admitting: Family Medicine

## 2018-05-29 VITALS — BP 131/80

## 2018-05-29 DIAGNOSIS — E782 Mixed hyperlipidemia: Secondary | ICD-10-CM | POA: Diagnosis not present

## 2018-05-29 DIAGNOSIS — M85859 Other specified disorders of bone density and structure, unspecified thigh: Secondary | ICD-10-CM

## 2018-05-29 DIAGNOSIS — Z6379 Other stressful life events affecting family and household: Secondary | ICD-10-CM

## 2018-05-29 DIAGNOSIS — Z5181 Encounter for therapeutic drug level monitoring: Secondary | ICD-10-CM | POA: Diagnosis not present

## 2018-05-29 DIAGNOSIS — E669 Obesity, unspecified: Secondary | ICD-10-CM | POA: Diagnosis not present

## 2018-05-29 HISTORY — DX: Other stressful life events affecting family and household: Z63.79

## 2018-05-29 NOTE — Telephone Encounter (Signed)
done

## 2018-05-29 NOTE — Assessment & Plan Note (Signed)
Practice fall precautions; calcium and weight-bearing

## 2018-05-29 NOTE — Patient Instructions (Signed)
Please do call to schedule your bone density study; the number to schedule one at either [facility] or [facility] is [phone] or [phone]

## 2018-05-29 NOTE — Assessment & Plan Note (Signed)
Ordering labs, but patient is welcome to wait until pandemic slows down; limit saturated fats

## 2018-05-29 NOTE — Assessment & Plan Note (Addendum)
Offered counseling; she is doing well with hydroxyzine; let me know if worse; offered SSRI and may start if she calls back

## 2018-05-29 NOTE — Assessment & Plan Note (Signed)
Encouragement given for weight loss; she's trying to use her Wii and stay active

## 2018-05-29 NOTE — Progress Notes (Signed)
BP 131/80   LMP  (LMP Unknown)    Subjective:    Patient ID: [name], female    DOB: 1945-04-30, 73 y.o.   MRN: [id number]  HPI: [name] is a 73 y.o. female  Chief Complaint  Patient presents with  . Follow-up    HPI Virtual Visit via Telephone/Video Note   I connected with the patient via:  telephone I verified that I am speaking with the correct person using two identifiers. She goes by [name] but we don't need to change it, hospital has it "C"  Call started: 9:03 am Call terminated: 9:22 am Total length of call: 19 minutes 28 seconds   I discussed the limitations, risks, and privacy concerns of performing an evaluation and management service by telephone and the availability of in-person appointments. I explained that he/she may be responsible for charges related to this service. The patient expressed understanding and agreed to proceed.  Provider location: office Patient location: at home Additional participants: Jenny Reichmann is on the other end of the house   High cholesterol; trying to stay away from fatty foods; sporadically eats cheese, not daily  Lab Results  Component Value Date   CHOL 180 04/09/2017   HDL 48 (L) 04/09/2017   [place] 91 04/09/2017   TRIG 287 (H) 04/09/2017   CHOLHDL 3.8 04/09/2017    Obesity; I asked how COVID-19 pandemic has affected her in terms of activity and food; she is still going out and walking the dogs; doing the Wii every day between 15-25 minutes a day; she is cooking; loves sweets  Depression score is mainly with her husband and his health has declined; it has really taken a toll on her; she is not able to get out to socialize because of the pandemic; she found a support group and cannot meet together with more than 10 people right now; she knows what her depression is from, his health has been affected; she does not want any other medicine besides the hydroxyzine which does help  Had a bladder infection not long  ago, was on ABX and back to normal in a week; taking in cranberries which she thinks is helping, capfuls  Osteopenia; getting calcium daily, eating yogurt; walking outside and doing Wii exercise  Depression screen [facility] 2/9 05/29/2018 01/11/2018 11/27/2017 09/21/2017 04/09/2017  Decreased Interest 2 0 2 0 0  Down, Depressed, Hopeless 2 0 2 0 2  PHQ - 2 Score 4 0 4 0 2  Altered sleeping 3 0 3 0 3  Tired, decreased energy 2 0 3 0 0  Change in appetite 3 0 2 0 1  Feeling bad or failure about yourself  1 0 1 0 0  Trouble concentrating 0 0 0 0 0  Moving slowly or fidgety/restless 0 0 0 0 0  Suicidal thoughts 0 0 0 0 0  PHQ-9 Score 13 0 13 0 6  Difficult doing work/chores Not difficult at all Not difficult at all Somewhat difficult Not difficult at all Somewhat difficult  MD note: no SI/HI  Fall Risk  05/29/2018 01/11/2018 11/27/2017 09/21/2017 04/09/2017  Falls in the past year? 0 1 No Yes No  Comment - - - tripped over tree root when walking in the woods -  Number falls in past yr: - 0 - 1 -  Injury with Fall? - 0 - No -  Risk for fall due to : - - - Impaired vision -  Risk for fall due  to: Comment - - - wears eyeglasses -  Follow up - - - Falls evaluation completed;Education provided;Falls prevention discussed -    Relevant past medical, surgical, family and social history reviewed Past Medical History:  Diagnosis Date  . Allergy   . Arthritis   . Asthma   . Cancer (HCC)    shoulder, nose skin CA- basal cell CA  . GERD (gastroesophageal reflux disease)    occasional  . Hyperlipidemia    Past Surgical History:  Procedure Laterality Date  . BREAST BIOPSY Right    core bx- neg  . COLONOSCOPY    . MOHS SURGERY    . TONSILLECTOMY    . VAGINAL HYSTERECTOMY     Family History  Problem Relation Age of Onset  . Colon cancer Paternal Uncle   . Rectal cancer Paternal Uncle   . Cancer Mother        lung   . Diabetes Father   . Parkinson's disease Father   . Alzheimer's disease Father    . Alcohol abuse Sister   . Heart disease Sister   . Cancer Son        livcolon, liver stage 4  . Stroke Paternal Grandmother   . Cancer Paternal Grandfather        lung  . Down syndrome Sister   . Esophageal cancer Neg Hx   . Stomach cancer Neg Hx   . Breast cancer Neg Hx    Social History   Tobacco Use  . Smoking status: Former Smoker    Packs/day: 1.50    Years: 20.00    Pack years: 30.00    Types: Cigarettes    Last attempt to quit: 1993    Years since quitting: 27.3  . Smokeless tobacco: Never Used  . Tobacco comment: smoking cessation materials not required  Substance Use Topics  . Alcohol use: Yes    Comment: occasional  . Drug use: No     Office Visit from 05/29/2018 in [facility]  AUDIT-C Score  0      Interim medical history since last visit reviewed. Allergies and medications reviewed  Review of Systems Per HPI unless specifically indicated above     Objective:    BP 131/80   LMP  (LMP Unknown)   Wt Readings from Last 3 Encounters:  01/11/18 175 lb 9.6 oz (79.7 kg)  11/27/17 175 lb 3.2 oz (79.5 kg)  09/21/17 176 lb 9.6 oz (80.1 kg)    Physical Exam Pulmonary:     Effort: No respiratory distress.  Neurological:     Mental Status: She is alert.     Cranial Nerves: No dysarthria.  Psychiatric:        Speech: Speech is not rapid and pressured, delayed or slurred.     Results for orders placed or performed in visit on 01/11/18  Urine Culture  Result Value Ref Range   Specimen Description URINE, RANDOM    Special Requests NONE    Culture      NO GROWTH Performed at [facility], [address], [place], [place] [address]    Report Status 01/12/2018 FINAL   POCT urinalysis dipstick  Result Value Ref Range   Color, UA yellow    Clarity, UA clear    Glucose, UA Negative Negative   Bilirubin, UA negative    Ketones, UA negative    Spec Grav, UA 1.010 1.010 - 1.025   Blood, UA positive    pH,  UA 5.0 5.0 - 8.0    Protein, UA Negative Negative   Urobilinogen, UA negative (A) 0.2 or 1.0 E.U./dL   Nitrite, UA positive    Leukocytes, UA Large (3+) (A) Negative   Appearance yellow    Odor no       Assessment & Plan:   Problem List Items Addressed This Visit      Musculoskeletal and Integument   Osteopenia    Practice fall precautions; calcium and weight-bearing      Relevant Orders   DG Bone Density     Other   Medication monitoring encounter   Relevant Orders   CBC   COMPLETE METABOLIC PANEL WITH GFR   Stress due to illness of family member    Offered counseling; she is doing well with hydroxyzine; let me know if worse; offered SSRI and may start if she calls back      Obesity (BMI 30.0-34.9)    Encouragement given for weight loss; she's trying to use her Wii and stay active      Hyperlipidemia - Primary    Ordering labs, but patient is welcome to wait until pandemic slows down; limit saturated fats      Relevant Orders   Lipid panel       Follow up plan: Return in about 6 months (around 11/28/2018) for follow-up visit with Dr. Sanda Klein; Medicare Wellness visits when due.   No orders of the defined types were placed in this encounter.   Orders Placed This Encounter  Procedures  . DG Bone Density  . CBC  . COMPLETE METABOLIC PANEL WITH GFR  . Lipid panel

## 2018-05-29 NOTE — Telephone Encounter (Signed)
Please schedule patient for an appointment With whom: Dr. Sanda Klein When: 6 months How: In person Why: obesity, high chol Thank you

## 2018-06-12 DIAGNOSIS — D2271 Melanocytic nevi of right lower limb, including hip: Secondary | ICD-10-CM | POA: Diagnosis not present

## 2018-06-12 DIAGNOSIS — D1800 Hemangioma unspecified site: Secondary | ICD-10-CM | POA: Diagnosis not present

## 2018-06-12 DIAGNOSIS — L918 Other hypertrophic disorders of the skin: Secondary | ICD-10-CM | POA: Diagnosis not present

## 2018-06-12 DIAGNOSIS — L905 Scar conditions and fibrosis of skin: Secondary | ICD-10-CM | POA: Diagnosis not present

## 2018-06-12 DIAGNOSIS — Z1283 Encounter for screening for malignant neoplasm of skin: Secondary | ICD-10-CM | POA: Diagnosis not present

## 2018-06-12 DIAGNOSIS — I788 Other diseases of capillaries: Secondary | ICD-10-CM | POA: Diagnosis not present

## 2018-06-12 DIAGNOSIS — Z85828 Personal history of other malignant neoplasm of skin: Secondary | ICD-10-CM | POA: Diagnosis not present

## 2018-06-12 DIAGNOSIS — D485 Neoplasm of uncertain behavior of skin: Secondary | ICD-10-CM | POA: Diagnosis not present

## 2018-06-12 DIAGNOSIS — L821 Other seborrheic keratosis: Secondary | ICD-10-CM | POA: Diagnosis not present

## 2018-06-12 DIAGNOSIS — L578 Other skin changes due to chronic exposure to nonionizing radiation: Secondary | ICD-10-CM | POA: Diagnosis not present

## 2018-06-12 DIAGNOSIS — D229 Melanocytic nevi, unspecified: Secondary | ICD-10-CM | POA: Diagnosis not present

## 2018-06-25 ENCOUNTER — Encounter: Payer: Self-pay | Admitting: Family Medicine

## 2018-08-14 ENCOUNTER — Other Ambulatory Visit: Payer: Self-pay | Admitting: Nurse Practitioner

## 2018-08-14 ENCOUNTER — Ambulatory Visit
Admission: RE | Admit: 2018-08-14 | Discharge: 2018-08-14 | Disposition: A | Discharge status: Home / Self Care | Payer: PPO | Source: Ambulatory Visit | Attending: Family Medicine | Admitting: Family Medicine

## 2018-08-14 DIAGNOSIS — M8589 Other specified disorders of bone density and structure, multiple sites: Secondary | ICD-10-CM | POA: Diagnosis not present

## 2018-08-14 DIAGNOSIS — E785 Hyperlipidemia, unspecified: Secondary | ICD-10-CM

## 2018-08-14 DIAGNOSIS — M85851 Other specified disorders of bone density and structure, right thigh: Secondary | ICD-10-CM | POA: Diagnosis not present

## 2018-08-14 DIAGNOSIS — M85859 Other specified disorders of bone density and structure, unspecified thigh: Secondary | ICD-10-CM

## 2018-08-22 ENCOUNTER — Other Ambulatory Visit: Payer: Self-pay | Admitting: Emergency Medicine

## 2018-08-22 DIAGNOSIS — E782 Mixed hyperlipidemia: Secondary | ICD-10-CM

## 2018-08-22 DIAGNOSIS — Z5181 Encounter for therapeutic drug level monitoring: Secondary | ICD-10-CM

## 2018-08-22 LAB — COMPLETE METABOLIC PANEL WITH GFR
AG Ratio: 2.2 (calc) (ref 1.0–2.5)
ALT: 16 U/L (ref 6–29)
AST: 17 U/L (ref 10–35)
Albumin: 4.2 g/dL (ref 3.6–5.1)
Alkaline phosphatase (APISO): 53 U/L (ref 37–153)
BUN/Creatinine Ratio: 14 (calc) (ref 6–22)
BUN: 14 mg/dL (ref 7–25)
CO2: 30 mmol/L (ref 20–32)
Calcium: 9.2 mg/dL (ref 8.6–10.4)
Chloride: 105 mmol/L (ref 98–110)
Creat: 0.97 mg/dL — ABNORMAL HIGH (ref 0.60–0.93)
GFR, Est African American: 67 mL/min/{1.73_m2} (ref >=60)
GFR, Est Non African American: 58 mL/min/{1.73_m2} — ABNORMAL LOW (ref >=60)
Globulin: 1.9 g/dL (calc) (ref 1.9–3.7)
Glucose, Bld: 103 mg/dL — ABNORMAL HIGH (ref 65–99)
Potassium: 4.3 mmol/L (ref 3.5–5.3)
Sodium: 142 mmol/L (ref 135–146)
Total Bilirubin: 0.6 mg/dL (ref 0.2–1.2)
Total Protein: 6.1 g/dL (ref 6.1–8.1)

## 2018-08-22 LAB — CBC
HCT: 44.9 % (ref 35.0–45.0)
Hemoglobin: 15.1 g/dL (ref 11.7–15.5)
MCH: 31.3 pg (ref 27.0–33.0)
MCHC: 33.6 g/dL (ref 32.0–36.0)
MCV: 93.2 fL (ref 80.0–100.0)
MPV: 11 fL (ref 7.5–12.5)
Platelets: 198 10*3/uL (ref 140–400)
RBC: 4.82 10*6/uL (ref 3.80–5.10)
RDW: 13.5 % (ref 11.0–15.0)
WBC: 8 10*3/uL (ref 3.8–10.8)

## 2018-08-22 LAB — LIPID PANEL
Cholesterol: 172 mg/dL (ref <200)
HDL: 50 mg/dL (ref >=50)
LDL Cholesterol (Calc): 89 mg/dL (calc)
Non-HDL Cholesterol (Calc): 122 mg/dL (calc) (ref <130)
Total CHOL/HDL Ratio: 3.4 (calc) (ref <5.0)
Triglycerides: 252 mg/dL — ABNORMAL HIGH (ref <150)

## 2018-09-05 ENCOUNTER — Encounter: Payer: Self-pay | Admitting: Gastroenterology

## 2018-09-06 ENCOUNTER — Encounter: Payer: Self-pay | Admitting: Family Medicine

## 2018-09-09 ENCOUNTER — Other Ambulatory Visit: Payer: Self-pay | Admitting: Nurse Practitioner

## 2018-09-09 ENCOUNTER — Telehealth: Payer: Self-pay | Admitting: Gastroenterology

## 2018-09-09 ENCOUNTER — Encounter: Payer: Self-pay | Admitting: Family Medicine

## 2018-09-09 DIAGNOSIS — Z1211 Encounter for screening for malignant neoplasm of colon: Secondary | ICD-10-CM

## 2018-09-09 NOTE — Telephone Encounter (Signed)
Patient called & l/m wanting to schedule a colonoscopy with Dr Allen Norris. (in work-q)

## 2018-09-09 NOTE — Telephone Encounter (Signed)
Pt left vm to schedule a colonoscopy with Dr. Wohl  °

## 2018-09-09 NOTE — Telephone Encounter (Signed)
Returned patients call.  LVM for patient to call back to schedule her colonoscopy with Dr. Allen Norris  Thanks Sharyn Lull

## 2018-09-13 ENCOUNTER — Telehealth: Payer: Self-pay

## 2018-09-13 ENCOUNTER — Other Ambulatory Visit: Payer: Self-pay

## 2018-09-13 DIAGNOSIS — Z1211 Encounter for screening for malignant neoplasm of colon: Secondary | ICD-10-CM

## 2018-09-13 DIAGNOSIS — Z86010 Personal history of colonic polyps: Secondary | ICD-10-CM

## 2018-09-13 DIAGNOSIS — Z8371 Family history of colonic polyps: Secondary | ICD-10-CM

## 2018-09-13 NOTE — Telephone Encounter (Signed)
Gastroenterology Pre-Procedure Review  Request Date: 10/08/18 Requesting Physician: Dr. Allen Norris  PATIENT REVIEW QUESTIONS: The patient responded to the following health history questions as indicated:    1. Are you having any GI issues? no 2. Do you have a personal history of Polyps? yes (5 years ago Colonoscopy at [facility]) 3. Do you have a family history of Colon Cancer or Polyps? yes (mother and father colon polyps) 4. Diabetes Mellitus? no 5. Joint replacements in the past 12 months?no 6. Major health problems in the past 3 months?no 7. Any artificial heart valves, MVP, or defibrillator?no    MEDICATIONS & ALLERGIES:    Patient reports the following regarding taking any anticoagulation/antiplatelet therapy:   Plavix, Coumadin, Eliquis, Xarelto, Lovenox, Pradaxa, Brilinta, or Effient? no Aspirin? no  Patient confirms/reports the following medications:  Current Outpatient Medications  Medication Sig Dispense Refill  . aspirin EC 81 MG tablet Take 1 tablet (81 mg total) by mouth daily.    . calcium carbonate (OS-CAL - DOSED IN MG OF ELEMENTAL CALCIUM) 1250 (500 Ca) MG tablet Take 1 tablet by mouth daily with breakfast.    . cholecalciferol (VITAMIN D) 1000 units tablet Take 1,000 Units by mouth daily.    . famotidine (PEPCID) 10 MG tablet Take 1 tablet (10 mg total) by mouth 2 (two) times daily as needed for heartburn or indigestion.    Marland Kitchen GLUCOSAMINE-CHONDROIT-CALCIUM PO Take 1,000 mg by mouth daily.    . hydrOXYzine (ATARAX/VISTARIL) 25 MG tablet TAKE 1 TABLET (25 MG TOTAL) BY MOUTH EVERY 8 (EIGHT) HOURS AS NEEDED. 270 tablet 1  . lovastatin (MEVACOR) 20 MG tablet TAKE 1 TABLET BY MOUTH EVERYDAY AT BEDTIME 90 tablet 1  . Multiple Vitamin (MULTIVITAMIN) tablet Take 1 tablet by mouth daily.     No current facility-administered medications for this visit.     Patient confirms/reports the following allergies:  Allergies  Allergen Reactions  . Tetanus Toxoids Shortness Of Breath  .  Bromsulphalein   . Sulfa Antibiotics     Severe vomiting    No orders of the defined types were placed in this encounter.   AUTHORIZATION INFORMATION Primary Insurance: 1D#: Group #:  Secondary Insurance: 1D#: Group #:  SCHEDULE INFORMATION: Date: 10/08/18 Time: Location:[facility]

## 2018-09-27 ENCOUNTER — Other Ambulatory Visit: Payer: Self-pay

## 2018-09-27 ENCOUNTER — Ambulatory Visit (INDEPENDENT_AMBULATORY_CARE_PROVIDER_SITE_OTHER): Payer: PPO

## 2018-09-27 VITALS — BP 118/68 | HR 68 | Temp 97.2°F | Ht 65.0 in | Wt 177.0 lb

## 2018-09-27 DIAGNOSIS — Z0000 Encounter for general adult medical examination without abnormal findings: Secondary | ICD-10-CM | POA: Diagnosis not present

## 2018-09-27 NOTE — Patient Instructions (Signed)
[name] , Thank you for taking time to come for your Medicare Wellness Visit. I appreciate your ongoing commitment to your health goals. Please review the following plan we discussed and let me know if I can assist you in the future.   Screening recommendations/referrals: Colonoscopy: done 08/27/13. Scheduled for 10/08/18 Mammogram: done 04/29/18 Bone Density: done 08/14/18 Recommended yearly ophthalmology/optometry visit for glaucoma screening and checkup Recommended yearly dental visit for hygiene and checkup  Vaccinations: Influenza vaccine: done 10/30/17 Pneumococcal vaccine: done 09/21/17 Tdap vaccine: n/a Shingles vaccine: Shingrix series complete    Conditions/risks identified: Recommend drinking 6-8 glasses of water per day  Next appointment: Please follow up in one year for your Medicare Annual Wellness visit.     Preventive Care 73 Years and Older, Female Preventive care refers to lifestyle choices and visits with your health care provider that can promote health and wellness. What does preventive care include?  A yearly physical exam. This is also called an annual well check.  Dental exams once or twice a year.  Routine eye exams. Ask your health care provider how often you should have your eyes checked.  Personal lifestyle choices, including:  Daily care of your teeth and gums.  Regular physical activity.  Eating a healthy diet.  Avoiding tobacco and drug use.  Limiting alcohol use.  Practicing safe sex.  Taking low-dose aspirin every day.  Taking vitamin and mineral supplements as recommended by your health care provider. What happens during an annual well check? The services and screenings done by your health care provider during your annual well check will depend on your age, overall health, lifestyle risk factors, and family history of disease. Counseling  Your health care provider may ask you questions about your:  Alcohol use.  Tobacco use.  Drug  use.  Emotional well-being.  Home and relationship well-being.  Sexual activity.  Eating habits.  History of falls.  Memory and ability to understand (cognition).  Work and work Statistician.  Reproductive health. Screening  You may have the following tests or measurements:  Height, weight, and BMI.  Blood pressure.  Lipid and cholesterol levels. These may be checked every 5 years, or more frequently if you are over 59 years old.  Skin check.  Lung cancer screening. You may have this screening every year starting at age 3 if you have a 30-pack-year history of smoking and currently smoke or have quit within the past 15 years.  Fecal occult blood test (FOBT) of the stool. You may have this test every year starting at age 31.  Flexible sigmoidoscopy or colonoscopy. You may have a sigmoidoscopy every 5 years or a colonoscopy every 10 years starting at age 18.  Hepatitis C blood test.  Hepatitis B blood test.  Sexually transmitted disease (STD) testing.  Diabetes screening. This is done by checking your blood sugar (glucose) after you have not eaten for a while (fasting). You may have this done every 1-3 years.  Bone density scan. This is done to screen for osteoporosis. You may have this done starting at age 73.  Mammogram. This may be done every 1-2 years. Talk to your health care provider about how often you should have regular mammograms. Talk with your health care provider about your test results, treatment options, and if necessary, the need for more tests. Vaccines  Your health care provider may recommend certain vaccines, such as:  Influenza vaccine. This is recommended every year.  Tetanus, diphtheria, and acellular pertussis (Tdap, Td) vaccine.  You may need a Td booster every 10 years.  Zoster vaccine. You may need this after age 73.  Pneumococcal 13-valent conjugate (PCV13) vaccine. One dose is recommended after age 73.  Pneumococcal polysaccharide  (PPSV23) vaccine. One dose is recommended after age 55. Talk to your health care provider about which screenings and vaccines you need and how often you need them. This information is not intended to replace advice given to you by your health care provider. Make sure you discuss any questions you have with your health care provider. Document Released: 02/26/2015 Document Revised: 10/20/2015 Document Reviewed: 12/01/2014 Elsevier Interactive Patient Education  2017 [place] Prevention in the Home Falls can cause injuries. They can happen to people of all ages. There are many things you can do to make your home safe and to help prevent falls. What can I do on the outside of my home?  Regularly fix the edges of walkways and driveways and fix any cracks.  Remove anything that might make you trip as you walk through a door, such as a raised step or threshold.  Trim any bushes or trees on the path to your home.  Use bright outdoor lighting.  Clear any walking paths of anything that might make someone trip, such as rocks or tools.  Regularly check to see if handrails are loose or broken. Make sure that both sides of any steps have handrails.  Any raised decks and porches should have guardrails on the edges.  Have any leaves, snow, or ice cleared regularly.  Use sand or salt on walking paths during winter.  Clean up any spills in your garage right away. This includes oil or grease spills. What can I do in the bathroom?  Use night lights.  Install grab bars by the toilet and in the tub and shower. Do not use towel bars as grab bars.  Use non-skid mats or decals in the tub or shower.  If you need to sit down in the shower, use a plastic, non-slip stool.  Keep the floor dry. Clean up any water that spills on the floor as soon as it happens.  Remove soap buildup in the tub or shower regularly.  Attach bath mats securely with double-sided non-slip rug tape.  Do not have  throw rugs and other things on the floor that can make you trip. What can I do in the bedroom?  Use night lights.  Make sure that you have a light by your bed that is easy to reach.  Do not use any sheets or blankets that are too big for your bed. They should not hang down onto the floor.  Have a firm chair that has side arms. You can use this for support while you get dressed.  Do not have throw rugs and other things on the floor that can make you trip. What can I do in the kitchen?  Clean up any spills right away.  Avoid walking on wet floors.  Keep items that you use a lot in easy-to-reach places.  If you need to reach something above you, use a strong step stool that has a grab bar.  Keep electrical cords out of the way.  Do not use floor polish or wax that makes floors slippery. If you must use wax, use non-skid floor wax.  Do not have throw rugs and other things on the floor that can make you trip. What can I do with my stairs?  Do not leave any  items on the stairs.  Make sure that there are handrails on both sides of the stairs and use them. Fix handrails that are broken or loose. Make sure that handrails are as long as the stairways.  Check any carpeting to make sure that it is firmly attached to the stairs. Fix any carpet that is loose or worn.  Avoid having throw rugs at the top or bottom of the stairs. If you do have throw rugs, attach them to the floor with carpet tape.  Make sure that you have a light switch at the top of the stairs and the bottom of the stairs. If you do not have them, ask someone to add them for you. What else can I do to help prevent falls?  Wear shoes that:  Do not have high heels.  Have rubber bottoms.  Are comfortable and fit you well.  Are closed at the toe. Do not wear sandals.  If you use a stepladder:  Make sure that it is fully opened. Do not climb a closed stepladder.  Make sure that both sides of the stepladder are  locked into place.  Ask someone to hold it for you, if possible.  Clearly mark and make sure that you can see:  Any grab bars or handrails.  First and last steps.  Where the edge of each step is.  Use tools that help you move around (mobility aids) if they are needed. These include:  Canes.  Walkers.  Scooters.  Crutches.  Turn on the lights when you go into a dark area. Replace any light bulbs as soon as they burn out.  Set up your furniture so you have a clear path. Avoid moving your furniture around.  If any of your floors are uneven, fix them.  If there are any pets around you, be aware of where they are.  Review your medicines with your doctor. Some medicines can make you feel dizzy. This can increase your chance of falling. Ask your doctor what other things that you can do to help prevent falls. This information is not intended to replace advice given to you by your health care provider. Make sure you discuss any questions you have with your health care provider. Document Released: 11/26/2008 Document Revised: 07/08/2015 Document Reviewed: 03/06/2014 Elsevier Interactive Patient Education  2017 [employer].

## 2018-09-27 NOTE — Progress Notes (Signed)
Subjective:   [name] is a 73 y.o. female who presents for Medicare Annual (Subsequent) preventive examination.  Review of Systems:   Cardiac Risk Factors include: advanced age (>39men, >66 women);dyslipidemia     Objective:     Vitals: BP 118/68 (BP Location: Left Arm, Patient Position: Sitting, Cuff Size: Normal)    Pulse 68    Temp (!) 97.2 F (36.2 C) (Oral)    Ht 5\' 5"  (1.651 m)    Wt 177 lb (80.3 kg)    LMP  (LMP Unknown)    BMI 29.45 kg/m   Body mass index is 29.45 kg/m.  Advanced Directives 09/27/2018 09/21/2017 12/04/2016 05/31/2016 02/16/2016 01/10/2016 07/05/2015  Does Patient Have a Medical Advance Directive? Yes Yes Yes No Yes Yes No  Type of Paramedic of [place];Living will [place];Living will [place];Living will - Living will Living will -  Does patient want to make changes to medical advance directive? No - Patient declined - - - - - -  Copy of [place] in Chart? Yes - validated most recent copy scanned in chart (See row information) No - copy requested - - - - -  Would patient like information on creating a medical advance directive? - - - - - - No - patient declined information    Tobacco Social History   Tobacco Use  Smoking Status Former Smoker   Packs/day: 1.50   Years: 20.00   Pack years: 30.00   Types: Cigarettes   Quit date: 1993   Years since quitting: 27.6  Smokeless Tobacco Never Used  Tobacco Comment   smoking cessation materials not required     Counseling given: Not Answered Comment: smoking cessation materials not required   Clinical Intake:  Pre-visit preparation completed: Yes  Pain : No/denies pain     BMI - recorded: 29.45 Nutritional Status: BMI 25 -29 Overweight Nutritional Risks: None Diabetes: No  How often do you need to have someone help you when you read instructions, pamphlets, or other written materials from your doctor  or pharmacy?: 1 - Never  Interpreter Needed?: No  Information entered by :: Clemetine Marker LPN  Past Medical History:  Diagnosis Date   Allergy    Arthritis    Asthma    Cancer ([place])    shoulder, nose skin CA- basal cell CA   GERD (gastroesophageal reflux disease)    occasional   Hyperlipidemia    Osteopenia    Past Surgical History:  Procedure Laterality Date   BREAST BIOPSY Right    core bx- neg   COLONOSCOPY     MOHS SURGERY     TONSILLECTOMY     VAGINAL HYSTERECTOMY     Family History  Problem Relation Age of Onset   Colon cancer Paternal Uncle    Rectal cancer Paternal Uncle    Cancer Mother        lung    Diabetes Father    Parkinson's disease Father    Alzheimer's disease Father    Alcohol abuse Sister    Heart disease Sister    Cancer Son        livcolon, liver stage 4   Stroke Paternal Grandmother    Cancer Paternal Grandfather        lung   Down syndrome Sister    Esophageal cancer Neg Hx    Stomach cancer Neg Hx    Breast cancer Neg Hx  Social History   Socioeconomic History   Marital status: Married    Spouse name: [name]   Number of children: 2   Years of education: Not on file   Highest education level: Bachelor's degree (e.g., BA, AB, BS)  Occupational History   Occupation: Retired  Scientist, product/process development strain: Not hard at [employer]    Worry: Never true    Inability: Never true   Transportation needs    Medical: No    Non-medical: No  Tobacco Use   Smoking status: Former Smoker    Packs/day: 1.50    Years: 20.00    Pack years: 30.00    Types: Cigarettes    Quit date: 1993    Years since quitting: 27.6   Smokeless tobacco: Never Used   Tobacco comment: smoking cessation materials not required  Substance and Sexual Activity   Alcohol use: Yes    Comment: occasional   Drug use: No   Sexual activity: Not Currently  Lifestyle   Physical activity    Days per  week: 5 days    Minutes per session: 40 min   Stress: To some extent  Relationships   Social connections    Talks on phone: More than three times a week    Gets together: Three times a week    Attends religious service: More than 4 times per year    Active member of club or organization: No    Attends meetings of clubs or organizations: Never    Relationship status: Married  Other Topics Concern   Not on file  Social History Narrative   Not on file    Outpatient Encounter Medications as of 09/27/2018  Medication Sig   aspirin EC 81 MG tablet Take 1 tablet (81 mg total) by mouth daily.   calcium carbonate (OS-CAL - DOSED IN MG OF ELEMENTAL CALCIUM) 1250 (500 Ca) MG tablet Take 1 tablet by mouth daily with breakfast.   cholecalciferol (VITAMIN D) 1000 units tablet Take 1,000 Units by mouth daily.   famotidine (PEPCID) 10 MG tablet Take 1 tablet (10 mg total) by mouth 2 (two) times daily as needed for heartburn or indigestion.   GLUCOSAMINE-CHONDROIT-CALCIUM PO Take 1,000 mg by mouth daily.   hydrOXYzine (ATARAX/VISTARIL) 25 MG tablet TAKE 1 TABLET (25 MG TOTAL) BY MOUTH EVERY 8 (EIGHT) HOURS AS NEEDED.   lovastatin (MEVACOR) 20 MG tablet TAKE 1 TABLET BY MOUTH EVERYDAY AT BEDTIME   Multiple Vitamin (MULTIVITAMIN) tablet Take 1 tablet by mouth daily.   vitamin B-12 (CYANOCOBALAMIN) 1000 MCG tablet Take 1,000 mcg by mouth daily.   vitamin C (ASCORBIC ACID) 500 MG tablet Take 500 mg by mouth daily.   No facility-administered encounter medications on file as of 09/27/2018.     Activities of Daily Living In your present state of health, do you have any difficulty performing the following activities: 09/27/2018 05/29/2018  Hearing? N N  Comment declines hearing aids -  Vision? N N  Difficulty concentrating or making decisions? N N  Walking or climbing stairs? N N  Dressing or bathing? N N  Doing errands, shopping? N N  Preparing Food and eating ? N -  Using the Toilet? N  -  In the past six months, have you accidently leaked urine? N -  Do you have problems with loss of bowel control? N -  Managing your Medications? N -  Managing your Finances? N -  Housekeeping or managing your  Housekeeping? N -  Some recent data might be hidden    Patient Care Team: Lada, Satira Anis, MD as PCP - General (Family Medicine)    Assessment:   This is a routine wellness examination for [place].  Exercise Activities and Dietary recommendations Current Exercise Habits: Home exercise routine, Type of exercise: walking, Time (Minutes): 30, Frequency (Times/Week): 5, Weekly Exercise (Minutes/Week): 150, Intensity: Mild, Exercise limited by: None identified  Goals     DIET - INCREASE WATER INTAKE     Recommend to drink at least 6-8 8oz glasses of water per day.       Fall Risk Fall Risk  09/27/2018 05/29/2018 01/11/2018 11/27/2017 09/21/2017  Falls in the past year? 1 0 1 No Yes  Comment - - - - tripped over tree root when walking in the woods  Number falls in past yr: 0 - 0 - 1  Injury with Fall? 0 - 0 - No  Risk for fall due to : - - - - Impaired vision  Risk for fall due to: Comment - - - - wears eyeglasses  Follow up Falls prevention discussed - - - Falls evaluation completed;Education provided;Falls prevention discussed   FALL RISK PREVENTION PERTAINING TO THE HOME:  Any stairs in or around the home? Yes  If so, do they handrails? Yes   Home free of loose throw rugs in walkways, pet beds, electrical cords, etc? Yes  Adequate lighting in your home to reduce risk of falls? Yes   ASSISTIVE DEVICES UTILIZED TO PREVENT FALLS:  Life alert? No  Use of a cane, walker or w/c? No  Grab bars in the bathroom? No  Shower chair or bench in shower? No  Elevated toilet seat or a handicapped toilet? Yes   DME ORDERS:  DME order needed?  No   TIMED UP AND GO:  Was the test performed? Yes .  Length of time to ambulate 10 feet: 5 sec.   GAIT:  Appearance of gait: Gait  stead-fast and without the use of an assistive device.   Education: Fall risk prevention has been discussed.  Intervention(s) required? No    Depression Screen PHQ 2/9 Scores 09/27/2018 05/29/2018 01/11/2018 11/27/2017  PHQ - 2 Score 2 4 0 4  PHQ- 9 Score 9 13 0 13     Cognitive Function     6CIT Screen 09/27/2018 09/21/2017  What Year? 0 points 0 points  What month? 0 points 0 points  What time? 0 points 0 points  Count back from 20 0 points 0 points  Months in reverse 0 points 0 points  Repeat phrase 0 points 0 points  Total Score 0 0    Immunization History  Administered Date(s) Administered   Influenza, High Dose Seasonal PF 11/03/2016, 10/30/2017   Influenza-Unspecified 10/14/2013, 11/15/2015, 11/03/2016   Pneumococcal Conjugate-13 12/09/2014, 05/31/2016   Pneumococcal Polysaccharide-23 09/21/2017    Qualifies for Shingles Vaccine? Yes  Shingrix series completed per patient though [facility] in [place] and no record of vaccine. Patient to supply vaccine record.  Tdap: n/a  Flu Vaccine: Up to date   Pneumococcal Vaccine: Up to date     Screening Tests Health Maintenance  Topic Date Due   Hepatitis C Screening  08-23-1945   COLONOSCOPY  08/28/2018   INFLUENZA VACCINE  09/14/2018   MAMMOGRAM  04/29/2019   DEXA SCAN  Completed   PNA vac Low Risk Adult  Completed   TETANUS/TDAP  Discontinued    Cancer Screenings:  Colorectal Screening: Completed 08/27/13. Repeat every 5 years; scheduled for 10/08/18   Mammogram: Completed 04/29/18. Repeat every year.  Bone Density: Completed 08/14/18. Results reflect OSTEOPENIA.  Repeat every 2 years.   Lung Cancer Screening: (Low Dose CT Chest recommended if Age 66-80 years, 30 pack-year currently smoking OR have quit w/in 15years.) does not qualify.   Additional Screening:  Hepatitis C Screening: does qualify; postponed  Vision Screening: Recommended annual ophthalmology exams for early detection of  glaucoma and other disorders of the eye. Is the patient up to date with their annual eye exam?  Yes  Who is the provider or what is the name of the office in which the pt attends annual eye exams? [facility] Screening: Recommended annual dental exams for proper oral hygiene  Community Resource Referral:  CRR required this visit?  No      Plan:     I have personally reviewed and addressed the Medicare Annual Wellness questionnaire and have noted the following in the patients chart:  A. Medical and social history B. Use of alcohol, tobacco or illicit drugs  C. Current medications and supplements D. Functional ability and status E.  Nutritional status F.  Physical activity G. Advance directives H. List of other physicians I.  Hospitalizations, surgeries, and ER visits in previous 12 months J.  [place] such as hearing and vision if needed, cognitive and depression L. Referrals and appointments   In addition, I have reviewed and discussed with patient certain preventive protocols, quality metrics, and best practice recommendations. A written personalized care plan for preventive services as well as general preventive health recommendations were provided to patient.   Signed,  Clemetine Marker, LPN Nurse Health Advisor   Nurse Notes: pt c/o caregiver stress due to husband suffering stroke last year and has upcoming shoulder replacement surgery. Her oldest son was also diagnosed with stage 4 liver cancer and she is very concerned. PHQ9 score of 9 today. Pt declined my offer to C3 team for community resources stating she had planned to use [facility] for support but they are not available at this time due to Covid. Pt advised to contact office if needed for feelings of stress/anxiety/depression. Pt voiced understanding and appreciative of visit today.

## 2018-09-30 ENCOUNTER — Telehealth: Payer: Self-pay | Admitting: Gastroenterology

## 2018-09-30 NOTE — Telephone Encounter (Signed)
Pt left vm to  Update her PCP and inform us of this information due to having a procedure this month.  Update in System

## 2018-10-04 ENCOUNTER — Other Ambulatory Visit
Admission: RE | Admit: 2018-10-04 | Discharge: 2018-10-04 | Disposition: A | Discharge status: Home / Self Care | Payer: PPO | Source: Ambulatory Visit | Attending: Gastroenterology | Admitting: Gastroenterology

## 2018-10-04 ENCOUNTER — Other Ambulatory Visit: Payer: Self-pay

## 2018-10-04 DIAGNOSIS — Z01812 Encounter for preprocedural laboratory examination: Secondary | ICD-10-CM | POA: Diagnosis not present

## 2018-10-04 DIAGNOSIS — Z20828 Contact with and (suspected) exposure to other viral communicable diseases: Secondary | ICD-10-CM | POA: Insufficient documentation

## 2018-10-04 LAB — SARS CORONAVIRUS 2 (TAT 6-24 HRS): SARS Coronavirus 2: NEGATIVE

## 2018-10-08 ENCOUNTER — Encounter
Admission: RE | Disposition: A | Discharge status: Home / Self Care | Payer: Self-pay | Source: Home / Self Care | Attending: Gastroenterology

## 2018-10-08 ENCOUNTER — Ambulatory Visit
Admission: RE | Admit: 2018-10-08 | Discharge: 2018-10-08 | Disposition: A | Discharge status: Home / Self Care | Payer: PPO | Attending: Gastroenterology | Admitting: Gastroenterology

## 2018-10-08 ENCOUNTER — Ambulatory Visit: Payer: PPO | Admitting: Registered Nurse

## 2018-10-08 ENCOUNTER — Other Ambulatory Visit: Payer: Self-pay

## 2018-10-08 ENCOUNTER — Encounter: Payer: Self-pay | Admitting: *Deleted

## 2018-10-08 DIAGNOSIS — E785 Hyperlipidemia, unspecified: Secondary | ICD-10-CM | POA: Diagnosis not present

## 2018-10-08 DIAGNOSIS — D123 Benign neoplasm of transverse colon: Secondary | ICD-10-CM | POA: Diagnosis not present

## 2018-10-08 DIAGNOSIS — Z860100 Personal history of colon polyps, unspecified: Secondary | ICD-10-CM

## 2018-10-08 DIAGNOSIS — K640 First degree hemorrhoids: Secondary | ICD-10-CM | POA: Insufficient documentation

## 2018-10-08 DIAGNOSIS — Z7982 Long term (current) use of aspirin: Secondary | ICD-10-CM | POA: Diagnosis not present

## 2018-10-08 DIAGNOSIS — K635 Polyp of colon: Secondary | ICD-10-CM | POA: Insufficient documentation

## 2018-10-08 DIAGNOSIS — Z87891 Personal history of nicotine dependence: Secondary | ICD-10-CM | POA: Insufficient documentation

## 2018-10-08 DIAGNOSIS — Z85828 Personal history of other malignant neoplasm of skin: Secondary | ICD-10-CM | POA: Insufficient documentation

## 2018-10-08 DIAGNOSIS — K219 Gastro-esophageal reflux disease without esophagitis: Secondary | ICD-10-CM | POA: Diagnosis not present

## 2018-10-08 DIAGNOSIS — Z1211 Encounter for screening for malignant neoplasm of colon: Secondary | ICD-10-CM | POA: Diagnosis not present

## 2018-10-08 DIAGNOSIS — D125 Benign neoplasm of sigmoid colon: Secondary | ICD-10-CM | POA: Diagnosis not present

## 2018-10-08 DIAGNOSIS — Z8371 Family history of colonic polyps: Secondary | ICD-10-CM

## 2018-10-08 DIAGNOSIS — D126 Benign neoplasm of colon, unspecified: Secondary | ICD-10-CM | POA: Diagnosis not present

## 2018-10-08 HISTORY — PX: COLONOSCOPY WITH PROPOFOL: SHX5780

## 2018-10-08 SURGERY — COLONOSCOPY WITH PROPOFOL
Anesthesia: General

## 2018-10-08 MED ORDER — PROPOFOL 500 MG/50ML IV EMUL
INTRAVENOUS | Status: DC | PRN
  Administered 2018-10-08: 150 ug/kg/min via INTRAVENOUS

## 2018-10-08 MED ORDER — LIDOCAINE HCL (CARDIAC) PF 100 MG/5ML IV SOSY
PREFILLED_SYRINGE | INTRAVENOUS | Status: DC | PRN
  Administered 2018-10-08: 40 mg via INTRAVENOUS

## 2018-10-08 MED ORDER — PROPOFOL 10 MG/ML IV BOLUS
INTRAVENOUS | Status: DC | PRN
  Administered 2018-10-08: 90 mg via INTRAVENOUS

## 2018-10-08 MED ORDER — SODIUM CHLORIDE 0.9 % IV SOLN
INTRAVENOUS | Status: DC
  Administered 2018-10-08: 08:00:00 1000 mL via INTRAVENOUS

## 2018-10-08 MED ORDER — PROPOFOL 500 MG/50ML IV EMUL
INTRAVENOUS | Status: AC
  Filled 2018-10-08: qty 50

## 2018-10-08 NOTE — Anesthesia Preprocedure Evaluation (Signed)
Anesthesia Evaluation  Patient identified by MRN, date of birth, ID band Patient awake    Reviewed: Allergy & Precautions, H&P , NPO status , Patient's Chart, lab work & pertinent test results, reviewed documented beta blocker date and time   History of Anesthesia Complications Negative for: history of anesthetic complications  Airway Mallampati: II  TM Distance: >3 FB Neck ROM: full    Dental  (+) Dental Advidsory Given   Pulmonary asthma , former smoker,    Pulmonary exam normal        Cardiovascular Exercise Tolerance: Good negative cardio ROS Normal cardiovascular exam     Neuro/Psych PSYCHIATRIC DISORDERS Anxiety negative neurological ROS     GI/Hepatic Neg liver ROS, GERD  ,  Endo/Other  negative endocrine ROS  Renal/GU negative Renal ROS  negative genitourinary   Musculoskeletal   Abdominal   Peds  Hematology negative hematology ROS (+)   Anesthesia Other Findings Past Medical History: No date: Allergy No date: Arthritis No date: Asthma No date: Cancer [facility])     Comment:  shoulder, nose skin CA- basal cell CA No date: GERD (gastroesophageal reflux disease)     Comment:  occasional No date: Hyperlipidemia No date: Osteopenia   Reproductive/Obstetrics negative OB ROS                             Anesthesia Physical Anesthesia Plan  ASA: II  Anesthesia Plan: General   Post-op Pain Management:    Induction: Intravenous  PONV Risk Score and Plan: 3 and Propofol infusion and TIVA  Airway Management Planned: Natural Airway and Nasal Cannula  Additional Equipment:   Intra-op Plan:   Post-operative Plan:   Informed Consent: I have reviewed the patients History and Physical, chart, labs and discussed the procedure including the risks, benefits and alternatives for the proposed anesthesia with the patient or authorized representative who has indicated his/her  understanding and acceptance.     Dental Advisory Given  Plan Discussed with: Anesthesiologist, CRNA and Surgeon  Anesthesia Plan Comments:         Anesthesia Quick Evaluation

## 2018-10-08 NOTE — Transfer of Care (Signed)
Immediate Anesthesia Transfer of Care Note  Patient: [name]  Procedure(s) Performed: Procedure(s): COLONOSCOPY WITH PROPOFOL (N/A)  Patient Location: PACU and Endoscopy Unit  Anesthesia Type:General  Level of Consciousness: sedated  Airway & Oxygen Therapy: Patient Spontanous Breathing and Patient connected to nasal cannula oxygen  Post-op Assessment: Report given to RN and Post -op Vital signs reviewed and stable  Post vital signs: Reviewed and stable  Last Vitals:  Vitals:   10/08/18 0752 10/08/18 0847  BP: (!) 143/70 132/65  Pulse: 69 62  Resp: 16 12  Temp: (!) 36.2 C   SpO2: [id number]    Complications: No apparent anesthesia complications

## 2018-10-08 NOTE — Anesthesia Postprocedure Evaluation (Signed)
Anesthesia Post Note  Patient: [name]  Procedure(s) Performed: COLONOSCOPY WITH PROPOFOL (N/A )  Patient location during evaluation: Endoscopy Anesthesia Type: General Level of consciousness: awake and alert Pain management: pain level controlled Vital Signs Assessment: post-procedure vital signs reviewed and stable Respiratory status: spontaneous breathing, nonlabored ventilation, respiratory function stable and patient connected to nasal cannula oxygen Cardiovascular status: blood pressure returned to baseline and stable Postop Assessment: no apparent nausea or vomiting Anesthetic complications: no     Last Vitals:  Vitals:   10/08/18 0907 10/08/18 0917  BP: 131/70 138/83  Pulse: (!) 52 (!) 56  Resp: 18 15  Temp:    SpO2: 100% 99%    Last Pain:  Vitals:   10/08/18 0917  TempSrc:   PainSc: 0-No pain                 Martha Clan

## 2018-10-08 NOTE — Op Note (Signed)
[facility] Gastroenterology Patient Name: [name] Procedure Date: 10/08/2018 8:20 AM MRN: [id number] Account #: [id number] Date of Birth: January 12, 1946 Admit Type: Outpatient Age: 73 Room: [facility] ENDO ROOM 4 Gender: Female Note Status: Finalized Procedure:            Colonoscopy Indications:          High risk colon cancer surveillance: Personal history                        of colonic polyps Providers:            Lucilla Lame MD, MD Referring MD:         [name] (Referring MD) Medicines:            Propofol per Anesthesia Complications:        No immediate complications. Procedure:            Pre-Anesthesia Assessment:                       - Prior to the procedure, a History and Physical was                        performed, and patient medications and allergies were                        reviewed. The patient's tolerance of previous                        anesthesia was also reviewed. The risks and benefits of                        the procedure and the sedation options and risks were                        discussed with the patient. All questions were                        answered, and informed consent was obtained. Prior                        Anticoagulants: The patient has taken no previous                        anticoagulant or antiplatelet agents. ASA Grade                        Assessment: II - A patient with mild systemic disease.                        After reviewing the risks and benefits, the patient was                        deemed in satisfactory condition to undergo the                        procedure.                       After obtaining informed consent, the colonoscope was  passed under direct vision. Throughout the procedure,                        the patient's blood pressure, pulse, and oxygen                        saturations were monitored continuously. The                        Colonoscope was  introduced through the anus and                        advanced to the the cecum, identified by appendiceal                        orifice and ileocecal valve. The colonoscopy was                        performed without difficulty. The patient tolerated the                        procedure well. The quality of the bowel preparation                        was excellent. Findings:      The perianal and digital rectal examinations were normal.      Five sessile polyps were found in the transverse colon. The polyps were       3 to 5 mm in size. These polyps were removed with a cold snare.       Resection and retrieval were complete.      Two sessile polyps were found in the sigmoid colon. The polyps were 3 to       4 mm in size. These polyps were removed with a cold snare. Resection and       retrieval were complete.      Non-bleeding internal hemorrhoids were found during retroflexion. The       hemorrhoids were Grade I (internal hemorrhoids that do not prolapse). Impression:           - Five 3 to 5 mm polyps in the transverse colon,                        removed with a cold snare. Resected and retrieved.                       - Two 3 to 4 mm polyps in the sigmoid colon, removed                        with a cold snare. Resected and retrieved.                       - Non-bleeding internal hemorrhoids. Recommendation:       - Discharge patient to home.                       - Resume previous diet.                       - Continue present medications.                       -  Await pathology results.                       - Repeat colonoscopy in 3 years for surveillance. Procedure Code(s):    --- Professional ---                       [id number], Colonoscopy, flexible; with removal of tumor(s),                        polyp(s), or other lesion(s) by snare technique Diagnosis Code(s):    --- Professional ---                       Z86.010, Personal history of colonic polyps                        K63.5, Polyp of colon CPT copyright 2019 American Medical Association. All rights reserved. The codes documented in this report are preliminary and upon coder review may  be revised to meet current compliance requirements. Lucilla Lame MD, MD 10/08/2018 8:45:42 AM This report has been signed electronically. Number of Addenda: 0 Note Initiated On: 10/08/2018 8:20 AM Scope Withdrawal Time: 0 hours 6 minutes 22 seconds  Total Procedure Duration: 0 hours 16 minutes 28 seconds  Estimated Blood Loss: Estimated blood loss: none.      [facility]

## 2018-10-08 NOTE — Anesthesia Post-op Follow-up Note (Signed)
Anesthesia QCDR form completed.        

## 2018-10-08 NOTE — Anesthesia Procedure Notes (Signed)
Date/Time: 10/08/2018 8:24 AM Performed by: Doreen Salvage, CRNA Pre-anesthesia Checklist: Patient identified, Emergency Drugs available, Suction available and Patient being monitored Patient Re-evaluated:Patient Re-evaluated prior to induction Oxygen Delivery Method: Nasal cannula Induction Type: IV induction Dental Injury: Teeth and Oropharynx as per pre-operative assessment  Comments: Nasal cannula with etCO2 monitoring

## 2018-10-08 NOTE — H&P (Signed)
Lucilla Lame, MD [facility] [address], [place],  [address] [phone] Fax : [phone]  Primary Care Physician:  [name], PA-C Primary Gastroenterologist:  Dr. Allen Norris  Pre-Procedure History & Physical: HPI:  [name] is a 73 y.o. female is here for an colonoscopy.   Past Medical History:  Diagnosis Date  . Allergy   . Arthritis   . Asthma   . Cancer (HCC)    shoulder, nose skin CA- basal cell CA  . GERD (gastroesophageal reflux disease)    occasional  . Hyperlipidemia   . Osteopenia     Past Surgical History:  Procedure Laterality Date  . BREAST BIOPSY Right    core bx- neg  . COLONOSCOPY    . MOHS SURGERY    . TONSILLECTOMY    . VAGINAL HYSTERECTOMY      Prior to Admission medications   Medication Sig Start Date End Date Taking? Authorizing Provider  aspirin EC 81 MG tablet Take 1 tablet (81 mg total) by mouth daily. 04/09/17   Arnetha Courser, MD  calcium carbonate (OS-CAL - DOSED IN MG OF ELEMENTAL CALCIUM) 1250 (500 Ca) MG tablet Take 1 tablet by mouth daily with breakfast.    [provider]  cholecalciferol (VITAMIN D) 1000 units tablet Take 1,000 Units by mouth daily.    [provider]  famotidine (PEPCID) 10 MG tablet Take 1 tablet (10 mg total) by mouth 2 (two) times daily as needed for heartburn or indigestion. 04/09/17   Arnetha Courser, MD  GLUCOSAMINE-CHONDROIT-CALCIUM PO Take 1,000 mg by mouth daily.    [provider]  hydrOXYzine (ATARAX/VISTARIL) 25 MG tablet TAKE 1 TABLET (25 MG TOTAL) BY MOUTH EVERY 8 (EIGHT) HOURS AS NEEDED. 01/18/18   Poulose, Bethel Born, NP  lovastatin (MEVACOR) 20 MG tablet TAKE 1 TABLET BY MOUTH EVERYDAY AT BEDTIME 08/14/18   Poulose, Bethel Born, NP  Multiple Vitamin (MULTIVITAMIN) tablet Take 1 tablet by mouth daily.    [provider]  vitamin B-12 (CYANOCOBALAMIN) 1000 MCG tablet Take 1,000 mcg by mouth daily.    [provider]  vitamin C (ASCORBIC  ACID) 500 MG tablet Take 500 mg by mouth daily.    [provider]    Allergies as of 09/13/2018 - Review Complete 05/29/2018  Allergen Reaction Noted  . Tetanus toxoids Shortness Of Breath 08/13/2013  . Bromsulphalein    . Sulfa antibiotics  08/13/2013    Family History  Problem Relation Age of Onset  . Colon cancer Paternal Uncle   . Rectal cancer Paternal Uncle   . Cancer Mother        lung   . Diabetes Father   . Parkinson's disease Father   . Alzheimer's disease Father   . Alcohol abuse Sister   . Heart disease Sister   . Cancer Son        livcolon, liver stage 4  . Stroke Paternal Grandmother   . Cancer Paternal Grandfather        lung  . Down syndrome Sister   . Esophageal cancer Neg Hx   . Stomach cancer Neg Hx   . Breast cancer Neg Hx     Social History   Socioeconomic History  . Marital status: Married    Spouse name: Jenny Reichmann  . Number of children: 2  . Years of education: Not on file  . Highest education level: Bachelor's degree (e.g., BA, AB, BS)  Occupational History  . Occupation: Retired  Social Needs  . Financial resource strain: Not hard at all  . Food insecurity    Worry: Never true    Inability: Never true  . Transportation needs    Medical: No    Non-medical: No  Tobacco Use  . Smoking status: Former Smoker    Packs/day: 1.50    Years: 20.00    Pack years: 30.00    Types: Cigarettes    Quit date: 1993    Years since quitting: 27.6  . Smokeless tobacco: Never Used  . Tobacco comment: smoking cessation materials not required  Substance and Sexual Activity  . Alcohol use: Yes    Comment: occasional  . Drug use: No  . Sexual activity: Not Currently  Lifestyle  . Physical activity    Days per week: 5 days    Minutes per session: 40 min  . Stress: To some extent  Relationships  . Social connections    Talks on phone: More than three times a week    Gets together: Three times a week    Attends religious service: More than 4  times per year    Active member of club or organization: No    Attends meetings of clubs or organizations: Never    Relationship status: Married  . Intimate partner violence    Fear of current or ex partner: No    Emotionally abused: No    Physically abused: No    Forced sexual activity: No  Other Topics Concern  . Not on file  Social History Narrative  . Not on file    Review of Systems: See HPI, otherwise negative ROS  Physical Exam: LMP  (LMP Unknown)  General:   Alert,  pleasant and cooperative in NAD Head:  Normocephalic and atraumatic. Neck:  Supple; no masses or thyromegaly. Lungs:  Clear throughout to auscultation.    Heart:  Regular rate and rhythm. Abdomen:  Soft, nontender and nondistended. Normal bowel sounds, without guarding, and without rebound.   Neurologic:  Alert and  oriented x4;  grossly normal neurologically.  Impression/Plan: [name] is here for an colonoscopy to be performed for a history of adenomatous polyps on 08/27/2013  Risks, benefits, limitations, and alternatives regarding  colonoscopy have been reviewed with the patient.  Questions have been answered.  All parties agreeable.   Lucilla Lame, MD  10/08/2018, 7:44 AM

## 2018-10-09 ENCOUNTER — Encounter: Payer: Self-pay | Admitting: Family Medicine

## 2018-10-09 LAB — SURGICAL PATHOLOGY

## 2018-10-10 ENCOUNTER — Encounter: Payer: Self-pay | Admitting: Gastroenterology

## 2018-10-15 ENCOUNTER — Other Ambulatory Visit: Payer: Self-pay

## 2018-10-15 ENCOUNTER — Ambulatory Visit (INDEPENDENT_AMBULATORY_CARE_PROVIDER_SITE_OTHER): Payer: PPO | Admitting: Nurse Practitioner

## 2018-10-15 ENCOUNTER — Encounter: Payer: Self-pay | Admitting: Nurse Practitioner

## 2018-10-15 VITALS — BP 124/62 | HR 69 | Temp 96.9°F | Resp 14 | Ht 63.0 in | Wt 176.6 lb

## 2018-10-15 DIAGNOSIS — N3000 Acute cystitis without hematuria: Secondary | ICD-10-CM | POA: Diagnosis not present

## 2018-10-15 DIAGNOSIS — F320 Major depressive disorder, single episode, mild: Secondary | ICD-10-CM

## 2018-10-15 DIAGNOSIS — R399 Unspecified symptoms and signs involving the genitourinary system: Secondary | ICD-10-CM

## 2018-10-15 DIAGNOSIS — F411 Generalized anxiety disorder: Secondary | ICD-10-CM | POA: Insufficient documentation

## 2018-10-15 LAB — POCT URINALYSIS DIPSTICK
Bilirubin, UA: NEGATIVE
Blood, UA: POSITIVE
Glucose, UA: NEGATIVE
Ketones, UA: NEGATIVE
Nitrite, UA: NEGATIVE
Odor: NORMAL
Protein, UA: NEGATIVE
Spec Grav, UA: 1.015 (ref 1.010–1.025)
Urobilinogen, UA: 0.2 E.U./dL
pH, UA: 6.5 (ref 5.0–8.0)

## 2018-10-15 MED ORDER — ESCITALOPRAM OXALATE 5 MG PO TABS: 5.0000 mg | ORAL_TABLET | Freq: Every day | ORAL | 0 refills | Status: DC

## 2018-10-15 MED ORDER — AMOXICILLIN-POT CLAVULANATE 875-125 MG PO TABS: 1.0000 | ORAL_TABLET | Freq: Two times a day (BID) | ORAL | 0 refills | Status: DC

## 2018-10-15 NOTE — Progress Notes (Signed)
Name: [name]   MRN: [id number]    DOB: 1945-07-30   Date:10/15/2018       Progress Note  Subjective  Chief Complaint  Chief Complaint  Patient presents with  . Urinary Tract Infection    Onset 2 days. Chills,urgency,frequency, and buring    HPI  UTI Symptoms Patient endorses 2 days of dysuria, urinary frequency and urgency. Has been having some chills.  Patient  Denies fevers, flank pain, CVA tenderness, n/v, fatigue.    Patient has had anxiety, depression over the last year.  Started with her husband becoming ill and not having a clear diagnosis on this, then her son was diagnosed with liver cancer and is going through treatments.  She states she has increased overall anxiety, feeling down and depressed and has had crying spells.  She has never taken a daily anxiety or depression medication in the past but is willing to try 1 today.  She is taking hydroxyzine as needed.  GAD 7 : Generalized Anxiety Score 10/15/2018  Nervous, Anxious, on Edge 3  Control/stop worrying 2  Worry too much - different things 2  Trouble relaxing 2  Restless 2  Easily annoyed or irritable 3  Afraid - awful might happen 1  Total GAD 7 Score 15  Anxiety Difficulty Somewhat difficult     PHQ2/9: Depression screen [facility] 2/9 10/15/2018 09/27/2018 05/29/2018 01/11/2018 11/27/2017  Decreased Interest 2 1 2  0 2  Down, Depressed, Hopeless 1 1 2  0 2  PHQ - 2 Score 3 2 4  0 4  Altered sleeping 3 3 3  0 3  Tired, decreased energy 2 3 2  0 3  Change in appetite 0 0 3 0 2  Feeling bad or failure about yourself  1 0 1 0 1  Trouble concentrating 0 0 0 0 0  Moving slowly or fidgety/restless 0 1 0 0 0  Suicidal thoughts 0 0 0 0 0  PHQ-9 Score 9 9 13  0 13  Difficult doing work/chores Somewhat difficult Not difficult at all Not difficult at all Not difficult at all Somewhat difficult    PHQ reviewed. Positive  Patient Active Problem List   Diagnosis Date Noted  . Personal history of colonic polyps   . Polyp  of transverse colon   . Obesity (BMI 30.0-34.9) 05/29/2018  . GERD (gastroesophageal reflux disease) 11/27/2017  . Osteopenia 11/27/2017  . Hyperlipidemia 12/09/2014  . Insomnia 12/09/2014  . Anxiety as acute reaction to exceptional stress 12/09/2014    Past Medical History:  Diagnosis Date  . Allergy   . Arthritis   . Asthma   . Cancer (HCC)    shoulder, nose skin CA- basal cell CA  . GERD (gastroesophageal reflux disease)    occasional  . Hyperlipidemia   . Osteopenia   . Stress due to illness of family member 05/29/2018    Past Surgical History:  Procedure Laterality Date  . BREAST BIOPSY Right    core bx- neg  . COLONOSCOPY    . COLONOSCOPY WITH PROPOFOL N/A 10/08/2018   Procedure: COLONOSCOPY WITH PROPOFOL;  Surgeon: Lucilla Lame, MD;  Location[facility] ENDOSCOPY;  Service: Endoscopy;  Laterality: N/A;  . MOHS SURGERY    . TONSILLECTOMY    . VAGINAL HYSTERECTOMY      Social History   Tobacco Use  . Smoking status: Former Smoker    Packs/day: 1.50    Years: 20.00    Pack years: 30.00    Types: Cigarettes    Quit  date: 102    Years since quitting: 27.6  . Smokeless tobacco: Never Used  . Tobacco comment: smoking cessation materials not required  Substance Use Topics  . Alcohol use: Yes    Comment: occasional     Current Outpatient Medications:  .  aspirin EC 81 MG tablet, Take 1 tablet (81 mg total) by mouth daily., Disp: , Rfl:  .  calcium carbonate (OS-CAL - DOSED IN MG OF ELEMENTAL CALCIUM) 1250 (500 Ca) MG tablet, Take 1 tablet by mouth daily with breakfast., Disp: , Rfl:  .  cholecalciferol (VITAMIN D) 1000 units tablet, Take 1,000 Units by mouth daily., Disp: , Rfl:  .  famotidine (PEPCID) 10 MG tablet, Take 1 tablet (10 mg total) by mouth 2 (two) times daily as needed for heartburn or indigestion., Disp: , Rfl:  .  GLUCOSAMINE-CHONDROIT-CALCIUM PO, Take 1,000 mg by mouth daily., Disp: , Rfl:  .  hydrOXYzine (ATARAX/VISTARIL) 25 MG tablet, TAKE 1 TABLET  (25 MG TOTAL) BY MOUTH EVERY 8 (EIGHT) HOURS AS NEEDED., Disp: 270 tablet, Rfl: 1 .  lovastatin (MEVACOR) 20 MG tablet, TAKE 1 TABLET BY MOUTH EVERYDAY AT BEDTIME, Disp: 90 tablet, Rfl: 1 .  Multiple Vitamin (MULTIVITAMIN) tablet, Take 1 tablet by mouth daily., Disp: , Rfl:  .  vitamin B-12 (CYANOCOBALAMIN) 1000 MCG tablet, Take 1,000 mcg by mouth daily., Disp: , Rfl:  .  vitamin C (ASCORBIC ACID) 500 MG tablet, Take 500 mg by mouth daily., Disp: , Rfl:   Allergies  Allergen Reactions  . Tetanus Toxoids Shortness Of Breath  . Bromsulphalein   . Sulfa Antibiotics     Severe vomiting    Review of Systems  Constitutional: Negative for chills, fever and malaise/fatigue.  Respiratory: Negative for cough and shortness of breath.   Cardiovascular: Negative for chest pain, palpitations and leg swelling.  Gastrointestinal: Negative for abdominal pain and blood in stool.  Genitourinary: Positive for dysuria and frequency.  Musculoskeletal: Negative for joint pain and myalgias.  Skin: Negative for rash.  Neurological: Negative for dizziness and headaches.  Psychiatric/Behavioral: The patient is not nervous/anxious and does not have insomnia.       No other specific complaints in a complete review of systems (except as listed in HPI above).  Objective  Vitals:   10/15/18 1125  BP: 124/62  Pulse: 69  Resp: 14  Temp: (!) 96.9 F (36.1 C)  SpO2: 96%  Weight: 176 lb 9.6 oz (80.1 kg)  Height: 5\' 3"  (1.6 m)    Body mass index is 31.28 kg/m.  Nursing Note and Vital Signs reviewed.  Physical Exam Constitutional:      Appearance: Normal appearance. She is well-developed.  HENT:     Head: Normocephalic and atraumatic.     Right Ear: Hearing normal.     Left Ear: Hearing normal.  Eyes:     Conjunctiva/sclera: Conjunctivae normal.  Cardiovascular:     Rate and Rhythm: Normal rate and regular rhythm.     Heart sounds: Normal heart sounds.  Pulmonary:     Effort: Pulmonary effort  is normal.     Breath sounds: Normal breath sounds.  Abdominal:     Tenderness: There is no right CVA tenderness or left CVA tenderness.  Musculoskeletal: Normal range of motion.  Neurological:     Mental Status: She is alert and oriented to person, place, and time.  Psychiatric:        Speech: Speech normal.        Behavior: Behavior  normal. Behavior is cooperative.        Thought Content: Thought content normal.        Judgment: Judgment normal.         Results for orders placed or performed in visit on 10/15/18 (from the past 48 hour(s))  POCT urinalysis dipstick     Status: Abnormal   Collection Time: 10/15/18 11:30 AM  Result Value Ref Range   Color, UA light yellow    Clarity, UA clear    Glucose, UA Negative Negative   Bilirubin, UA neg    Ketones, UA neg    Spec Grav, UA 1.015 1.010 - 1.025   Blood, UA pos    pH, UA 6.5 5.0 - 8.0   Protein, UA Negative Negative   Urobilinogen, UA 0.2 0.2 or 1.0 E.U./dL   Nitrite, UA neg    Leukocytes, UA Large (3+) (A) Negative   Appearance clear    Odor normal     Assessment & Plan  1. UTI symptoms - POCT urinalysis dipstick - Urine Culture - amoxicillin-clavulanate (AUGMENTIN) 875-125 MG tablet; Take 1 tablet by mouth 2 (two) times daily.  Dispense: 20 tablet; Refill: 0  2. GAD (generalized anxiety disorder) - escitalopram (LEXAPRO) 5 MG tablet; Take 1 tablet (5 mg total) by mouth daily.  Dispense: 90 tablet; Refill: 0  3. Acute cystitis without hematuria - amoxicillin-clavulanate (AUGMENTIN) 875-125 MG tablet; Take 1 tablet by mouth 2 (two) times daily.  Dispense: 20 tablet; Refill: 0  4. Current mild episode of major depressive disorder without prior episode (HCC) - escitalopram (LEXAPRO) 5 MG tablet; Take 1 tablet (5 mg total) by mouth daily.  Dispense: 90 tablet; Refill: 0   Face-to-face time with patient was more than 25 minutes, >50% time spent counseling and coordination of care

## 2018-10-15 NOTE — Patient Instructions (Addendum)
-   Start taking lexapro 5mg  nightly, you can increase to lexapro 10mg  in 3 weeks if you have not noticed any improvements.  _______ Your symptoms should begin to improve within a day of starting antibiotics. But you should finish all the antibiotic pills you get to ensure the bacteria is killed and prevent antibiotic resistance.  Please take your antibiotic as prescribed with food on your stomach to prevent nausea and upset stomach. Take the antibiotic for the entire course, even if your symptoms resolve before the course if completed. Using antibiotics inappropriately can make it harder to treat future infections. If you have any concerning side effects please stop the medication and let us know immediately. I do recommend taking a probiotic to replenish your good gut health anytime you take an antibiotic. You can get probiotics in types of yogurt like activia, or other food/drinks such as kimchi, kombucha , sauerkraut, or you can take an over the counter supplement.   If you are having pain when you pee, you can also take a medicine to numb your bladder. Look for Phenazopyridine (Pyridium or AZO) over the counter at your pharmacy. This medicine eases the pain caused by urinary tract infections. It also reduces the need to urinate.  If you frequently get UTI's here are some prevention tips:  - Avoiding spermicides  - Drinking more fluid - This can help prevent bladder infections. ?Urinating right after sex - Some doctors think this helps, because it helps flush out germs that might get into the bladder during sex. There is no proof it works, but it also cannot hurt. ?Vaginal estrogen - If you are a woman who has already been through menopause, your doctor might suggest this. Vaginal estrogen comes in a cream or a flexible ring that you put into your vagina. It can help prevent bladder infections. ?Antibiotics - If you get a lot of bladder infections, and the above methods have not helped, your doctor  might give you antibiotics to help prevent infection. But taking antibiotics has downsides, so doctors usually suggest trying other things first   The studies suggesting that cranberry products prevent bladder infections are not very good. Other studies suggest that cranberry products do not prevent bladder infections. But if you want to try cranberry products for this purpose, there is probably not much harm in doing so.

## 2018-10-17 LAB — URINE CULTURE
MICRO NUMBER:: 835419
SPECIMEN QUALITY:: ADEQUATE

## 2018-11-28 ENCOUNTER — Encounter: Payer: Self-pay | Admitting: Family Medicine

## 2018-11-28 ENCOUNTER — Other Ambulatory Visit: Payer: Self-pay

## 2018-11-28 ENCOUNTER — Ambulatory Visit (INDEPENDENT_AMBULATORY_CARE_PROVIDER_SITE_OTHER): Payer: PPO | Admitting: Family Medicine

## 2018-11-28 VITALS — BP 122/72 | HR 71 | Temp 98.5°F | Resp 14 | Ht 63.0 in | Wt 177.5 lb

## 2018-11-28 DIAGNOSIS — E785 Hyperlipidemia, unspecified: Secondary | ICD-10-CM

## 2018-11-28 DIAGNOSIS — R399 Unspecified symptoms and signs involving the genitourinary system: Secondary | ICD-10-CM

## 2018-11-28 DIAGNOSIS — F411 Generalized anxiety disorder: Secondary | ICD-10-CM | POA: Diagnosis not present

## 2018-11-28 DIAGNOSIS — F320 Major depressive disorder, single episode, mild: Secondary | ICD-10-CM

## 2018-11-28 MED ORDER — LOVASTATIN 20 MG PO TABS: ORAL_TABLET | ORAL | 1 refills | Status: DC

## 2018-11-28 MED ORDER — HYDROXYZINE HCL 25 MG PO TABS: 25.0000 mg | ORAL_TABLET | Freq: Three times a day (TID) | ORAL | 1 refills | Status: DC | PRN

## 2018-11-28 MED ORDER — ESCITALOPRAM OXALATE 5 MG PO TABS: 5.0000 mg | ORAL_TABLET | Freq: Every day | ORAL | 3 refills | Status: DC

## 2018-11-28 NOTE — Patient Instructions (Signed)
Come back in the next couple months to get your labs done.   I want to check your cholesterol, liver function and kidney function  Drink plenty of fluids and try to get out and do some walking to help work out some of your stress.  Let me know if there is anything we can do to help you!  So good to meet you

## 2018-11-28 NOTE — Progress Notes (Signed)
Name: [name]   MRN: [id number]    DOB: [birth year]   Date:11/28/2018       Progress Note  Chief Complaint  Patient presents with  . Follow-up  . Depression  . Hyperlipidemia     Subjective:   [name] is a 73 y.o. female, presents to clinic for routine follow up on the conditions listed above.  Pt states no HTN, last labs did show some CKD stage 3, she asks if it may be from UTI at the time of doing those labs.   Lab Results  Component Value Date   CREATININE 0.97 (H) 08/22/2018   Lab Results  Component Value Date   BUN 14 08/22/2018   Lab Results  Component Value Date   GFRNONAA 58 (L) 08/22/2018   No change to urine output, no BP issues, no swelling.  Had GFR below 60 2x in the past 1-2 years.  Hyperlipidemia: Current Medication Regimen:  Lovastatin 20 mg Last Lipids: Lab Results  Component Value Date   CHOL 172 08/22/2018   HDL 50 08/22/2018   LDLCALC 89 08/22/2018   TRIG 252 (H) 08/22/2018   CHOLHDL 3.4 08/22/2018   - Current Diet:  No diet efforts - no fried food no fast food - her problem is ice cream, sweats and has been "stress eating a lot" - Denies: Chest pain, shortness of breath, myalgias. - Documented aortic atherosclerosis? No - Risk factors for atherosclerosis: hypercholesterolemia   Having more frequent UTIs over the past year or two.  Last was in Sept.   Last weekend she felt like she had a UTI this last weekend but she has been taking a cranberry supplement and it "knocked it out" offered urine testing today and she declined.  Reviewed her last several urine tests and culture reports - had negative cultures - discussed other causes of UTI sx, and she will come back if she has sx again.  Anxiety and depression Pt started meds not that long ago, on lexapro 5 mg, tolerating without any SE, she feels like it has been very helpful for her sx. Sx that got better are the crying spells and aggitation.  She feels like she is coping better  with things in her life and with being a caregiver. Over the past year she has had her son dx with stage 4 liver CA (more recent), her older sister dx with stage 4 lung cancer (also recent) and her husband last year had a stroke - personality changes, bad temper which is new, short term memory is not what it used to be, he is a very independent, "stubborn" man and its been hard for him to depend on her.  He also had gallbladder surgery last year and one month ago he had shoulder replacement.  She takes care of him completely on her own.  They have no outside help.  It has been very hard and isolating for her.  Right now she is taking him to PT 2x a week.  Overall she says her mood is better, but she is still stressed and eating more - her PHQ score is higher but she reports less depressed mood and crying, less aggitation and anxiety about the unknown.  Depression screen [facility] 2/9 11/28/2018 10/15/2018 09/27/2018  Decreased Interest 1 2 1   Down, Depressed, Hopeless 2 1 1   PHQ - 2 Score 3 3 2   Altered sleeping 3 3 3   Tired, decreased energy 3 2 3   Change in  appetite 3 0 0  Feeling bad or failure about yourself  1 1 0  Trouble concentrating 1 0 0  Moving slowly or fidgety/restless 0 0 1  Suicidal thoughts 0 0 0  PHQ-9 Score 14 9 9   Difficult doing work/chores Somewhat difficult Somewhat difficult Not difficult at all  Some recent data might be hidden   GAD 7 : Generalized Anxiety Score 11/28/2018 10/15/2018  Nervous, Anxious, on Edge 2 3  Control/stop worrying 2 2  Worry too much - different things 2 2  Trouble relaxing 2 2  Restless 1 2  Easily annoyed or irritable 1 3  Afraid - awful might happen 0 1  Total GAD 7 Score 10 15  Anxiety Difficulty Somewhat difficult Somewhat difficult   She is still using the hydroxyzine to help her sleep some nights.   Obesity:  Weight over the past 2 years has been pretty stable - she endorses stress eating and treats like ice cream, and she grabs her abdomen  referring to how she needs to get back out walking and loose some weight, but no sig weight change. Encouraged healthy diet and starting to go walking to help decompress some of her stress.   Wt Readings from Last 10 Encounters:  11/28/18 177 lb 8 oz (80.5 kg)  10/15/18 176 lb 9.6 oz (80.1 kg)  10/08/18 173 lb 8 oz (78.7 kg)  09/27/18 177 lb (80.3 kg)  01/11/18 175 lb 9.6 oz (79.7 kg)  11/27/17 175 lb 3.2 oz (79.5 kg)  09/21/17 176 lb 9.6 oz (80.1 kg)  04/09/17 175 lb (79.4 kg)  12/29/16 178 lb (80.7 kg)  12/04/16 177 lb 11.2 oz (80.6 kg)   BMI Readings from Last 5 Encounters:  11/28/18 31.44 kg/m  10/15/18 31.28 kg/m  10/08/18 29.78 kg/m  09/27/18 29.45 kg/m  01/11/18 30.14 kg/m     Patient Active Problem List   Diagnosis Date Noted  . Current mild episode of major depressive disorder without prior episode ([place]) 10/15/2018  . GAD (generalized anxiety disorder) 10/15/2018  . Personal history of colonic polyps   . Polyp of transverse colon   . Obesity (BMI 30.0-34.9) 05/29/2018  . GERD (gastroesophageal reflux disease) 11/27/2017  . Osteopenia 11/27/2017  . Hyperlipidemia 12/09/2014  . Insomnia 12/09/2014  . Anxiety as acute reaction to exceptional stress 12/09/2014    Past Surgical History:  Procedure Laterality Date  . BREAST BIOPSY Right    core bx- neg  . COLONOSCOPY    . COLONOSCOPY WITH PROPOFOL N/A 10/08/2018   Procedure: COLONOSCOPY WITH PROPOFOL;  Surgeon: Lucilla Lame, MD;  Location: [facility] ENDOSCOPY;  Service: Endoscopy;  Laterality: N/A;  . MOHS SURGERY    . TONSILLECTOMY    . VAGINAL HYSTERECTOMY      Family History  Problem Relation Age of Onset  . Colon cancer Paternal Uncle   . Rectal cancer Paternal Uncle   . Cancer Mother        lung   . Diabetes Father   . Parkinson's disease Father   . Alzheimer's disease Father   . Alcohol abuse Sister   . Heart disease Sister   . Lung cancer Sister   . Cancer Son        livcolon, liver stage 4  .  Liver cancer Son   . Stroke Paternal Grandmother   . Cancer Paternal Grandfather        lung  . Down syndrome Sister   . Esophageal cancer Neg Hx   .  Stomach cancer Neg Hx   . Breast cancer Neg Hx     Social History   Socioeconomic History  . Marital status: Married    Spouse name: [name]  . Number of children: 2  . Years of education: Not on file  . Highest education level: Bachelor's degree (e.g., BA, AB, BS)  Occupational History  . Occupation: Retired  Scientific laboratory technician  . Financial resource strain: Not hard at all  . Food insecurity    Worry: Never true    Inability: Never true  . Transportation needs    Medical: No    Non-medical: No  Tobacco Use  . Smoking status: Former Smoker    Packs/day: 1.50    Years: 20.00    Pack years: 30.00    Types: Cigarettes    Quit date: 1993    Years since quitting: 27.8  . Smokeless tobacco: Never Used  . Tobacco comment: smoking cessation materials not required  Substance and Sexual Activity  . Alcohol use: Yes    Comment: occasional  . Drug use: Never  . Sexual activity: Not Currently  Lifestyle  . Physical activity    Days per week: 5 days    Minutes per session: 40 min  . Stress: To some extent  Relationships  . Social connections    Talks on phone: More than three times a week    Gets together: Three times a week    Attends religious service: More than 4 times per year    Active member of club or organization: No    Attends meetings of clubs or organizations: Never    Relationship status: Married  . Intimate partner violence    Fear of current or ex partner: No    Emotionally abused: No    Physically abused: No    Forced sexual activity: No  Other Topics Concern  . Not on file  Social History Narrative  . Not on file     Current Outpatient Medications:  .  aspirin EC 81 MG tablet, Take 1 tablet (81 mg total) by mouth daily., Disp: , Rfl:  .  calcium carbonate (OS-CAL - DOSED IN MG OF ELEMENTAL CALCIUM) 1250  (500 Ca) MG tablet, Take 1 tablet by mouth daily with breakfast., Disp: , Rfl:  .  cholecalciferol (VITAMIN D) 1000 units tablet, Take 1,000 Units by mouth daily., Disp: , Rfl:  .  escitalopram (LEXAPRO) 5 MG tablet, Take 1 tablet (5 mg total) by mouth daily., Disp: 90 tablet, Rfl: 0 .  famotidine (PEPCID) 10 MG tablet, Take 1 tablet (10 mg total) by mouth 2 (two) times daily as needed for heartburn or indigestion., Disp: , Rfl:  .  GLUCOSAMINE-CHONDROIT-CALCIUM PO, Take 1,000 mg by mouth daily., Disp: , Rfl:  .  hydrOXYzine (ATARAX/VISTARIL) 25 MG tablet, TAKE 1 TABLET (25 MG TOTAL) BY MOUTH EVERY 8 (EIGHT) HOURS AS NEEDED., Disp: 270 tablet, Rfl: 1 .  lovastatin (MEVACOR) 20 MG tablet, TAKE 1 TABLET BY MOUTH EVERYDAY AT BEDTIME, Disp: 90 tablet, Rfl: 1 .  Multiple Vitamin (MULTIVITAMIN) tablet, Take 1 tablet by mouth daily., Disp: , Rfl:  .  vitamin B-12 (CYANOCOBALAMIN) 1000 MCG tablet, Take 1,000 mcg by mouth daily., Disp: , Rfl:  .  vitamin C (ASCORBIC ACID) 500 MG tablet, Take 500 mg by mouth daily., Disp: , Rfl:  .  amoxicillin-clavulanate (AUGMENTIN) 875-125 MG tablet, Take 1 tablet by mouth 2 (two) times daily. (Patient not taking: Reported on 11/28/2018), Disp: 20 tablet, Rfl:  0  Allergies  Allergen Reactions  . Tetanus Toxoids Shortness Of Breath  . Bromsulphalein   . Sulfa Antibiotics     Severe vomiting    I personally reviewed active problem list, medication list, allergies, family history, social history, health maintenance, notes from last encounter, lab results, imaging with the patient/caregiver today.  Review of Systems  Constitutional: Negative.   HENT: Negative.   Eyes: Negative.   Respiratory: Negative.   Cardiovascular: Negative.   Gastrointestinal: Negative.   Endocrine: Negative.   Genitourinary: Negative.   Musculoskeletal: Negative.   Skin: Negative.   Allergic/Immunologic: Negative.   Neurological: Negative.   Hematological: Negative.    Psychiatric/Behavioral: Negative.   All other systems reviewed and are negative.    Objective:    Vitals:   11/28/18 1422  BP: 122/72  Pulse: 71  Resp: 14  Temp: 98.5 F (36.9 C)  SpO2: 98%  Weight: 177 lb 8 oz (80.5 kg)  Height: 5\' 3"  (1.6 m)    Body mass index is 31.44 kg/m.  Physical Exam Vitals signs and nursing note reviewed.  Constitutional:      General: She is not in acute distress.    Appearance: Normal appearance. She is well-developed. She is obese. She is not ill-appearing, toxic-appearing or diaphoretic.     Interventions: Face mask in place.  HENT:     Head: Normocephalic and atraumatic.     Right Ear: External ear normal.     Left Ear: External ear normal.  Eyes:     General: Lids are normal. No scleral icterus.       Right eye: No discharge.        Left eye: No discharge.     Conjunctiva/sclera: Conjunctivae normal.  Neck:     Musculoskeletal: Normal range of motion and neck supple.     Trachea: Phonation normal. No tracheal deviation.  Cardiovascular:     Rate and Rhythm: Normal rate and regular rhythm.     Pulses: Normal pulses.          Radial pulses are 2+ on the right side and 2+ on the left side.       Posterior tibial pulses are 2+ on the right side and 2+ on the left side.     Heart sounds: Normal heart sounds. No murmur. No friction rub. No gallop.   Pulmonary:     Effort: Pulmonary effort is normal. No respiratory distress.     Breath sounds: Normal breath sounds. No stridor. No wheezing, rhonchi or rales.  Chest:     Chest wall: No tenderness.  Abdominal:     General: Bowel sounds are normal. There is no distension.     Palpations: Abdomen is soft.     Tenderness: There is no abdominal tenderness. There is no guarding or rebound.  Musculoskeletal: Normal range of motion.        General: No deformity.     Right lower leg: No edema.     Left lower leg: No edema.  Lymphadenopathy:     Cervical: No cervical adenopathy.  Skin:     General: Skin is warm and dry.     Capillary Refill: Capillary refill takes less than 2 seconds.     Coloration: Skin is not jaundiced or pale.     Findings: No rash.  Neurological:     Mental Status: She is alert and oriented to person, place, and time.     Motor: No abnormal muscle tone.  Gait: Gait normal.  Psychiatric:        Attention and Perception: Attention normal.        Mood and Affect: Mood normal.        Speech: Speech normal.        Behavior: Behavior normal. Behavior is cooperative.        Thought Content: Thought content normal. Thought content does not include homicidal or suicidal ideation. Thought content does not include homicidal or suicidal plan.        Cognition and Memory: Cognition normal.      POCT urinalysis dipstick     Status: Abnormal   Collection Time: 10/15/18 11:30 AM  Result Value Ref Range   Color, UA light yellow    Clarity, UA clear    Glucose, UA Negative Negative   Bilirubin, UA neg    Ketones, UA neg    Spec Grav, UA 1.015 1.010 - 1.025   Blood, UA pos    pH, UA 6.5 5.0 - 8.0   Protein, UA Negative Negative   Urobilinogen, UA 0.2 0.2 or 1.0 E.U./dL   Nitrite, UA neg    Leukocytes, UA Large (3+) (A) Negative   Appearance clear    Odor normal   Urine Culture     Status: None   Collection Time: 10/15/18 11:39 AM   Specimen: Urine  Result Value Ref Range   MICRO NUMBER: [id number]    SPECIMEN QUALITY: Adequate    Sample Source URINE, CLEAN CATCH    STATUS: FINAL    ISOLATE 1:      Single organism less than 10,000 CFU/mL isolated. These organisms, commonly found on external and internal genitalia, are considered colonizers. No further testing performed.      PHQ2/9: Depression screen [facility] 2/9 11/28/2018 10/15/2018 09/27/2018 05/29/2018 01/11/2018  Decreased Interest 1 2 1 2  0  Down, Depressed, Hopeless 2 1 1 2  0  PHQ - 2 Score 3 3 2 4  0  Altered sleeping 3 3 3 3  0  Tired, decreased energy 3 2 3 2  0  Change in appetite 3 0 0 3 0   Feeling bad or failure about yourself  1 1 0 1 0  Trouble concentrating 1 0 0 0 0  Moving slowly or fidgety/restless 0 0 1 0 0  Suicidal thoughts 0 0 0 0 0  PHQ-9 Score 14 9 9 13  0  Difficult doing work/chores Somewhat difficult Somewhat difficult Not difficult at all Not difficult at all Not difficult at all  Some recent data might be hidden    phq 9 is positive See above  Fall Risk: Fall Risk  11/28/2018 10/15/2018 09/27/2018 05/29/2018 01/11/2018  Falls in the past year? 1 0 1 0 1  Comment - - - - -  Number falls in past yr: 1 0 0 - 0  Injury with Fall? 0 0 0 - 0  Risk for fall due to : - - - - -  Risk for fall due to: Comment - - - - -  Follow up - - Falls prevention discussed - -      Functional Status Survey: Is the patient deaf or have difficulty hearing?: No Does the patient have difficulty seeing, even when wearing glasses/contacts?: No Does the patient have difficulty concentrating, remembering, or making decisions?: No Does the patient have difficulty walking or climbing stairs?: No Does the patient have difficulty dressing or bathing?: No Does the patient have difficulty doing errands alone such as visiting a  doctor's office or shopping?: No    Assessment & Plan:     ICD-10-CM   1. Hyperlipidemia, unspecified hyperlipidemia type  E78.5 lovastatin (MEVACOR) 20 MG tablet    COMPLETE METABOLIC PANEL WITH GFR    Lipid panel   compliant with meds, recheck labs, no concerning SE, encouraged exercising and working on decreasing sweats/substituting sweats  2. UTI symptoms  R39.9    Discussed at length currently she does not want to retest but is having frequent UTI symptoms encouraged her to come when symptomatic  3. GAD (generalized anxiety disorder)  F41.1 escitalopram (LEXAPRO) 5 MG tablet    hydrOXYzine (ATARAX/VISTARIL) 25 MG tablet   Supportive listening, still scoring high on PHQ and gad 7 but overall feels she is managing her stressors better, encouraged her to  find caregiver support/break  4. Current mild episode of major depressive disorder without prior episode ([place])  F32.0    improving but still active - continue meds, encouraged her to take time for herself      Return in about 6 months (around 05/29/2019) for Routine follow-up.   [name], PA-C 11/28/18 2:38 PM

## 2019-03-13 NOTE — Progress Notes (Signed)
New Outpatient Visit Date: 03/14/2019  Referring Provider: [name], PA-C [id number] [place] [address],  [place] [address]  Chief Complaint: Palpitations  HPI:  [name] is a 74 y.o. female who is being seen today as a self-referral for evaluation of palpitations. She has a history of hyperlipidemia, depression, generalized anxiety disorder, and colon polyps.  [name] reports several months of palpitations that she attributes to stress.  She has been under quite a bit of stress over the last 2 years due to health issues with her husband and son as well as her sister's recent death from lung cancer.  She describes episodes during which it feels like her heart beats very rapidly for 30 to 40 seconds.  She then has a "pinching" sensation in the left side of her chest that lasts for 4 to 5 minutes before resolving spontaneously.  Currently, these episodes happen about twice a week.  There are no clear precipitants.  She notes that she drinks coffee, tea, and caffeinated soda most days.  [name] otherwise denies chest pain, including with exertion.  She has some exertional dyspnea when she tries to run, though this is longstanding.  She attributes it to her weight and underlying asthma.  She has not had any lightheadedness.  She notes a single episode of passing out many years ago while donating blood.  She denies a history of prior heart disease.  She underwent a stress test about 20 years ago as part of a complete physical.  She believes it was normal.  --------------------------------------------------------------------------------------------------  Cardiovascular History & Procedures: Cardiovascular Problems:  Palpitations  Atypical chest pain  Risk Factors:  Hyperlipidemia, family history, and age greater than 74  Cath/PCI:  None  CV Surgery:  None  EP Procedures and Devices:  None  Non-Invasive Evaluation(s):  None available.  Patient reports negative  stress test approximately 20 years ago  Recent CV Pertinent Labs: Lab Results  Component Value Date   CHOL 172 08/22/2018   CHOL 158 07/07/2015   HDL 50 08/22/2018   HDL 53 07/07/2015   LDLCALC 89 08/22/2018   TRIG 252 (H) 08/22/2018   CHOLHDL 3.4 08/22/2018   K 4.3 08/22/2018   BUN 14 08/22/2018   BUN 11 07/07/2015   CREATININE 0.97 (H) 08/22/2018    --------------------------------------------------------------------------------------------------  Past Medical History:  Diagnosis Date  . Allergy   . Arthritis   . Asthma   . Cancer (HCC)    shoulder, nose skin CA- basal cell CA  . GERD (gastroesophageal reflux disease)    occasional  . Hyperlipidemia   . Osteopenia   . Stress due to illness of family member 05/29/2018    Past Surgical History:  Procedure Laterality Date  . BREAST BIOPSY Right    core bx- neg  . COLONOSCOPY    . COLONOSCOPY WITH PROPOFOL N/A 10/08/2018   Procedure: COLONOSCOPY WITH PROPOFOL;  Surgeon: Lucilla Lame, MD;  Location[facility] ENDOSCOPY;  Service: Endoscopy;  Laterality: N/A;  . MOHS SURGERY    . TONSILLECTOMY    . VAGINAL HYSTERECTOMY      Current Meds  Medication Sig  . aspirin EC 81 MG tablet Take 1 tablet (81 mg total) by mouth daily.  . calcium carbonate (OS-CAL - DOSED IN MG OF ELEMENTAL CALCIUM) 1250 (500 Ca) MG tablet Take 1 tablet by mouth daily with breakfast.  . cholecalciferol (VITAMIN D) 1000 units tablet Take 1,000 Units by mouth daily.  Marland Kitchen escitalopram (LEXAPRO) 5 MG tablet  Take 1 tablet (5 mg total) by mouth daily.  . famotidine (PEPCID) 10 MG tablet Take 1 tablet (10 mg total) by mouth 2 (two) times daily as needed for heartburn or indigestion.  Marland Kitchen GLUCOSAMINE-CHONDROIT-CALCIUM PO Take 1,000 mg by mouth daily.  . hydrOXYzine (ATARAX/VISTARIL) 25 MG tablet Take 1 tablet (25 mg total) by mouth every 8 (eight) hours as needed.  . lovastatin (MEVACOR) 20 MG tablet TAKE 1 TABLET BY MOUTH EVERYDAY AT BEDTIME  . Multiple Vitamin  (MULTIVITAMIN) tablet Take 1 tablet by mouth daily.  . vitamin B-12 (CYANOCOBALAMIN) 1000 MCG tablet Take 1,000 mcg by mouth daily.  . vitamin C (ASCORBIC ACID) 500 MG tablet Take 500 mg by mouth daily.    Allergies: Tetanus toxoids, Bromsulphalein, and Sulfa antibiotics  Social History   Tobacco Use  . Smoking status: Former Smoker    Packs/day: 1.50    Years: 20.00    Pack years: 30.00    Types: Cigarettes    Quit date: 1993    Years since quitting: 28.0  . Smokeless tobacco: Never Used  Substance Use Topics  . Alcohol use: Not Currently  . Drug use: Never    Family History  Problem Relation Age of Onset  . Colon cancer Paternal Uncle   . Rectal cancer Paternal Uncle   . Cancer Mother        lung   . Diabetes Father   . Parkinson's disease Father   . Alzheimer's disease Father   . Heart attack Father 36  . Alcohol abuse Sister   . Heart disease Sister   . Lung cancer Sister   . Cancer Son        livcolon, liver stage 4  . Liver cancer Son   . Stroke Paternal Grandmother   . Cancer Paternal Grandfather        lung  . Down syndrome Sister   . Esophageal cancer Neg Hx   . Stomach cancer Neg Hx   . Breast cancer Neg Hx     Review of Systems: [name] notes some weakness in her right arm over the last month, stating that it just does not feel a strong as she would expect.  She does not recall specific trauma but may have injured it.  She denies other focal neurologic deficits.  Otherwise, a 12-system review of systems was performed and was negative except as noted in the HPI.  --------------------------------------------------------------------------------------------------  Physical Exam: BP 128/78 (BP Location: Right Arm, Patient Position: Sitting, Cuff Size: Normal)   Pulse 68   Ht 5\' 4"  (1.626 m)   Wt 179 lb 8 oz (81.4 kg)   LMP  (LMP Unknown)   SpO2 97%   BMI 30.81 kg/m   General: NAD. HEENT: No conjunctival pallor or scleral icterus. Facemask in  place. Neck: Supple without lymphadenopathy, thyromegaly, JVD, or HJR. No carotid bruit. Lungs: Normal work of breathing. Clear to auscultation bilaterally without wheezes or crackles. Heart: Regular rate and rhythm without murmurs, rubs, or gallops. Non-displaced PMI. Abd: Bowel sounds present. Soft, NT/ND without hepatosplenomegaly Ext: No lower extremity edema. Radial, PT, and DP pulses are 2+ bilaterally Skin: Warm and dry without rash. Neuro: CNIII-XII intact. Strength and fine-touch sensation intact in upper and lower extremities bilaterally. Psych: Normal mood and affect.  EKG: Normal sinus rhythm with sinus arrhythmia and nonspecific ST/T changes.  No prior tracing available for comparison.  Lab Results  Component Value Date   WBC 8.0 08/22/2018   HGB 15.1 08/22/2018  HCT 44.9 08/22/2018   MCV 93.2 08/22/2018   PLT 198 08/22/2018    Lab Results  Component Value Date   NA 142 08/22/2018   K 4.3 08/22/2018   CL 105 08/22/2018   CO2 30 08/22/2018   BUN 14 08/22/2018   CREATININE 0.97 (H) 08/22/2018   GLUCOSE 103 (H) 08/22/2018   ALT 16 08/22/2018    Lab Results  Component Value Date   CHOL 172 08/22/2018   HDL 50 08/22/2018   LDLCALC 89 08/22/2018   TRIG 252 (H) 08/22/2018   CHOLHDL 3.4 08/22/2018   Lab Results  Component Value Date   TSH 0.73 02/16/2016   --------------------------------------------------------------------------------------------------  ASSESSMENT AND PLAN: Palpitations and atypical chest pain: Symptoms have been present for at least a few months and seem to be driven by stress.  I suspect that caffeine may also be contributing.  EKG today shows nonspecific ST/T changes without significant arrhythmia.  There are no worrisome findings on physical exam.  We discussed further work-up options and have agreed to proceed with a 14-day event monitor to better characterize her palpitations.  Based on findings, we will need to readdress the utility of  obtaining an echocardiogram and noninvasive ischemia test in the future.  In the meantime, I have encouraged [name] to minimize her caffeine intake.  If significant arrhythmia is seen, we may need to consider repeating BMP and TSH.  Right arm weakness: No significant strength discrepancy on exam today.  If symptoms persist, [name] should contact her PCP for further assessment.  Follow-up: Return to clinic in 6 weeks.  [name], MD 03/14/2019 1:04 PM

## 2019-03-14 ENCOUNTER — Other Ambulatory Visit: Payer: Self-pay

## 2019-03-14 ENCOUNTER — Encounter: Payer: Self-pay | Admitting: Internal Medicine

## 2019-03-14 ENCOUNTER — Ambulatory Visit (INDEPENDENT_AMBULATORY_CARE_PROVIDER_SITE_OTHER): Payer: PPO

## 2019-03-14 ENCOUNTER — Ambulatory Visit (INDEPENDENT_AMBULATORY_CARE_PROVIDER_SITE_OTHER): Payer: PPO | Admitting: Internal Medicine

## 2019-03-14 VITALS — BP 128/78 | HR 68 | Ht 64.0 in | Wt 179.5 lb

## 2019-03-14 DIAGNOSIS — R29898 Other symptoms and signs involving the musculoskeletal system: Secondary | ICD-10-CM

## 2019-03-14 DIAGNOSIS — R0789 Other chest pain: Secondary | ICD-10-CM | POA: Diagnosis not present

## 2019-03-14 DIAGNOSIS — R002 Palpitations: Secondary | ICD-10-CM

## 2019-03-14 HISTORY — DX: Other symptoms and signs involving the musculoskeletal system: R29.898

## 2019-03-14 HISTORY — DX: Other chest pain: R07.89

## 2019-03-14 NOTE — Patient Instructions (Signed)
Please limit your intake of caffeine.   Medication Instructions:  Your physician recommends that you continue on your current medications as directed. Please refer to the Current Medication list given to you today.  *If you need a refill on your cardiac medications before your next appointment, please call your pharmacy*  Lab Work: none If you have labs (blood work) drawn today and your tests are completely normal, you will receive your results only by: Marland Kitchen MyChart Message (if you have MyChart) OR . A paper copy in the mail If you have any lab test that is abnormal or we need to change your treatment, we will call you to review the results.  Testing/Procedures: Your physician has recommended that you wear an 14 day ZIO event monitor. Event monitors are medical devices that record the heart's electrical activity. Doctors most often [place] these monitors to diagnose arrhythmias. Arrhythmias are problems with the speed or rhythm of the heartbeat. The monitor is a small, portable device. You can wear one while you do your normal daily activities. This is usually used to diagnose what is causing palpitations/syncope (passing out). A Zio Patch Event Heart monitor will be applied to your chest today.  You will wear the patch for 14 days. After 24 hours, you may shower with the heart monitor on. If you feel any palpitations or symptoms, you may press and release the button in the middle of the monitor.  Follow-Up: At [facility], you and your health needs are our priority.  As part of our continuing mission to provide you with exceptional heart care, we have created designated Provider Care Teams.  These Care Teams include your primary Cardiologist (physician) and Advanced Practice Providers (APPs -  Physician Assistants and Nurse Practitioners) who all work together to provide you with the care you need, when you need it.  Your next appointment:   6 week(s)  The format for your next appointment:    In Person  Provider:    You may see DR Harrell Gave END or one of the following Advanced Practice Providers on your designated Care Team:    Murray Hodgkins, NP  Christell Faith, PA-C  Marrianne Mood, PA-C

## 2019-03-27 ENCOUNTER — Other Ambulatory Visit: Payer: Self-pay | Admitting: Family Medicine

## 2019-03-27 DIAGNOSIS — Z1231 Encounter for screening mammogram for malignant neoplasm of breast: Secondary | ICD-10-CM

## 2019-04-07 DIAGNOSIS — R002 Palpitations: Secondary | ICD-10-CM | POA: Diagnosis not present

## 2019-04-14 ENCOUNTER — Telehealth: Payer: Self-pay | Admitting: *Deleted

## 2019-04-14 DIAGNOSIS — I471 Supraventricular tachycardia: Secondary | ICD-10-CM

## 2019-04-14 NOTE — Telephone Encounter (Signed)
-----   Message from [name], MD sent at 04/14/2019  7:58 AM EST ----- Please let Ms. Tarantola know that her event monitor showed occasional extra beats, with some episodes lasting up to 12 seconds.  Though these arrhythmias did not correspond with when she triggered the monitor, they could explain some of her palpitations.  I suggest that we obtain an echocardiogram to exclude structural abnormalities contributing to her PSVT.  Her follow-up can be delayed until after completion of the echocardiogram.

## 2019-04-14 NOTE — Telephone Encounter (Signed)
Results called to pt. Pt verbalized understanding. Echo order entered. Patient transferred to scheduler to rearrange appointments.

## 2019-04-23 ENCOUNTER — Other Ambulatory Visit: Payer: Self-pay

## 2019-04-23 ENCOUNTER — Ambulatory Visit (INDEPENDENT_AMBULATORY_CARE_PROVIDER_SITE_OTHER): Payer: PPO

## 2019-04-23 DIAGNOSIS — I471 Supraventricular tachycardia: Secondary | ICD-10-CM

## 2019-04-25 ENCOUNTER — Ambulatory Visit: Payer: PPO | Admitting: Physician Assistant

## 2019-05-05 NOTE — Progress Notes (Signed)
Cardiology Office Note    Date:  05/08/2019   ID:  [name], DOB 1946/01/25, MRN [id number]  PCP:  [name], PA-C  Cardiologist:  [name], MD  Electrophysiologist:  None   Chief Complaint: Follow-up  History of Present Illness:   [name] is a 74 y.o. female with history of HLD, asthma, depression, generalized anxiety disorder, and colon polyps who presents for follow-up of palpitations.  She was evaluated by Dr. [name] as a new patient on 03/14/2019 for palpitations.  At that time, she noted she had been under a fair amount of stress over the preceding 2 years secondary to health issues with her husband and son as well as her sister's recent death from lung cancer.  She described episodes of palpitations in which her heart would beat very rapidly for 30 to 40 seconds followed by a "pinching" sensation in the left side of her chest that would last for 4 to 5 minutes with spontaneous resolution.  These episodes would happen about twice per week without clear precipitants.  On most days, she would drink coffee, tea, and caffeinated soda.  She did note some exertional dyspnea when she tried to run though this was a longstanding issue that she attributed to her weight and underlying asthma.  She denied exertional chest pain.  There was a remote isolated episode of syncope while donating blood many years prior.  She reported a stress test approximately 20 years prior as part of a CPE that she believed was normal.  She underwent Zio patch monitoring which showed a predominant rhythm of sinus with an average heart rate of 64 bpm (range 44 to 132 bpm in sinus), rare PACs and PVCs.  There were 20 atrial runs lasting up to 12.1 seconds with a maximal rate of 184 bpm.  There were no sustained arrhythmias or prolonged pauses.  Patient triggered events corresponded to sinus rhythm.  Echo 04/23/2019 showed an EF of 55 to 60%, no RWMA, normal diastolic function, normal RV systolic function and  cavity size, and trivial mitral regurgitation  She comes in doing well from a cardiac perspective.  She continues to note episodic chest discomfort described as a "pinch" that is randomly occurring and will typically last for several seconds followed by spontaneous resolution.  This discomfort will present along different locations of her chest.  If she is ambulating when this discomfort presents she does note some increased dyspnea.  Otherwise, she denies any associated symptoms.  This discomfort will typically occur several times per week.  She continues to note tachypalpitations that also are randomly occurring and not associated with her chest discomfort.  These palpitations will typically last for 30 to 40 seconds and not associated with symptoms.  She has cut out her diet Dr. Malachi Bonds with continued intermittent palpitations.  No dizziness, presyncope, syncope.  No falls, though did trip over one of her dogs recently.  She continues to walk on a regular basis   Labs independently reviewed: 08/2018 - TC 172, TG 252, HDL 50, LDL 89, BUN 14, serum creatinine 0.97, potassium 4.3, albumin 4.2, AST/ALT normal Hgb 15.1, PLT 198 02/2016 - TSH normal  Past Medical History:  Diagnosis Date  . Allergy   . Arthritis   . Asthma   . Cancer (HCC)    shoulder, nose skin CA- basal cell CA  . GERD (gastroesophageal reflux disease)    occasional  . Hyperlipidemia   . Osteopenia   . Stress due to  illness of family member 05/29/2018    Past Surgical History:  Procedure Laterality Date  . BREAST BIOPSY Right    core bx- neg  . COLONOSCOPY    . COLONOSCOPY WITH PROPOFOL N/A 10/08/2018   Procedure: COLONOSCOPY WITH PROPOFOL;  Surgeon: Lucilla Lame, MD;  Location: [facility] ENDOSCOPY;  Service: Endoscopy;  Laterality: N/A;  . MOHS SURGERY    . TONSILLECTOMY    . VAGINAL HYSTERECTOMY      Current Medications: Current Meds  Medication Sig  . aspirin EC 81 MG tablet Take 1 tablet (81 mg total) by mouth daily.    . calcium carbonate (OS-CAL - DOSED IN MG OF ELEMENTAL CALCIUM) 1250 (500 Ca) MG tablet Take 1 tablet by mouth daily with breakfast.  . famotidine (PEPCID) 10 MG tablet Take 1 tablet (10 mg total) by mouth 2 (two) times daily as needed for heartburn or indigestion.  Marland Kitchen GLUCOSAMINE-CHONDROIT-CALCIUM PO Take 1,000 mg by mouth daily.  . hydrOXYzine (ATARAX/VISTARIL) 25 MG tablet Take 1 tablet (25 mg total) by mouth every 8 (eight) hours as needed.  . lovastatin (MEVACOR) 20 MG tablet TAKE 1 TABLET BY MOUTH EVERYDAY AT BEDTIME  . Multiple Vitamin (MULTIVITAMIN) tablet Take 1 tablet by mouth daily.  . vitamin B-12 (CYANOCOBALAMIN) 1000 MCG tablet Take 1,000 mcg by mouth daily.  . vitamin C (ASCORBIC ACID) 500 MG tablet Take 500 mg by mouth daily.    Allergies:   Tetanus toxoids, Bromsulphalein, and Sulfa antibiotics   Social History   Socioeconomic History  . Marital status: Married    Spouse name: Jenny Reichmann  . Number of children: 2  . Years of education: Not on file  . Highest education level: Bachelor's degree (e.g., BA, AB, BS)  Occupational History  . Occupation: Retired  Tobacco Use  . Smoking status: Former Smoker    Packs/day: 1.50    Years: 20.00    Pack years: 30.00    Types: Cigarettes    Quit date: 1993    Years since quitting: 28.2  . Smokeless tobacco: Never Used  Substance and Sexual Activity  . Alcohol use: Not Currently  . Drug use: Never  . Sexual activity: Not Currently  Other Topics Concern  . Not on file  Social History Narrative  . Not on file   Social Determinants of Health   Financial Resource Strain:   . Difficulty of Paying Living Expenses:   Food Insecurity:   . Worried About Charity fundraiser in the Last Year:   . Arboriculturist in the Last Year:   Transportation Needs:   . Film/video editor (Medical):   Marland Kitchen Lack of Transportation (Non-Medical):   Physical Activity: Unknown  . Days of Exercise per Week: 5 days  . Minutes of Exercise per  Session: Not on file  Stress: Stress Concern Present  . Feeling of Stress : To some extent  Social Connections: Unknown  . Frequency of Communication with Friends and Family: More than three times a week  . Frequency of Social Gatherings with Friends and Family: Three times a week  . Attends Religious Services: More than 4 times per year  . Active Member of Clubs or Organizations: No  . Attends Archivist Meetings: Never  . Marital Status: Not on file     Family History:  The patient's family history includes Alcohol abuse in her sister; Alzheimer's disease in her father; Cancer in her mother, paternal grandfather, and son; Colon cancer in her paternal  uncle; Diabetes in her father; Down syndrome in her sister; Heart attack (age of onset: 34) in her father; Heart disease in her sister; Liver cancer in her son; Lung cancer in her sister; Parkinson's disease in her father; Rectal cancer in her paternal uncle; Stroke in her paternal grandmother. There is no history of Esophageal cancer, Stomach cancer, or Breast cancer.  ROS:   Review of Systems  Constitutional: Negative for chills, diaphoresis, fever, malaise/fatigue and weight loss.  HENT: Negative for congestion.   Eyes: Negative for discharge and redness.  Respiratory: Positive for shortness of breath. Negative for cough, sputum production and wheezing.   Cardiovascular: Positive for chest pain. Negative for palpitations, orthopnea, claudication, leg swelling and PND.  Gastrointestinal: Negative for abdominal pain, heartburn, nausea and vomiting.  Genitourinary: Negative for hematuria.  Musculoskeletal: Negative for falls and myalgias.  Skin: Negative for rash.  Neurological: Negative for dizziness, tingling, tremors, sensory change, speech change, focal weakness, loss of consciousness and weakness.  Psychiatric/Behavioral: Negative for substance abuse. The patient is not nervous/anxious.   All other systems reviewed and are  negative.    EKGs/Labs/Other Studies Reviewed:    Studies reviewed were summarized above. The additional studies were reviewed today:  Zio patch 02/2019:  Patient was monitored for 13 days, 21 hours.  The predominant rhythm was sinus with an average rate of 64 bpm (range 44 to 132 bpm in sinus).  There were rare PACs and PVCs.  20 atrial runs lasting up to 12.1 seconds with a maximal rate of 184 bpm occurred.  No sustained arrhythmia or prolonged pause was identified.  Patient triggered events correspond to sinus rhythm.   Predominantly sinus rhythm with rare PACs and PVCs as well as PSVT.  Patient triggered events correspond to sinus rhythm. __________  2D echo 04/23/2019: 1. Left ventricular ejection fraction, by estimation, is 55 to 60%. The  left ventricle has normal function. The left ventricle has no regional  wall motion abnormalities. Left ventricular diastolic parameters were  normal.  2. Right ventricular systolic function is normal. The right ventricular  size is normal.  3. The mitral valve is normal in structure. Trivial mitral valve  regurgitation. No evidence of mitral stenosis.  4. The aortic valve is normal in structure. Aortic valve regurgitation is  not visualized. No aortic stenosis is present.  5. The inferior vena cava is normal in size with greater than 50%  respiratory variability, suggesting right atrial pressure of 3 mmHg.  EKG:  EKG is ordered today.  The EKG ordered today demonstrates NSR, 63 bpm, nonspecific ST-T changes, unchanged from prior  Recent Labs: 08/22/2018: ALT 16; BUN 14; Creat 0.97; Hemoglobin 15.1; Platelets 198; Potassium 4.3; Sodium 142  Recent Lipid Panel    Component Value Date/Time   CHOL 172 08/22/2018 0808   CHOL 158 07/07/2015 0817   TRIG 252 (H) 08/22/2018 0808   HDL 50 08/22/2018 0808   HDL 53 07/07/2015 0817   CHOLHDL 3.4 08/22/2018 0808   VLDL 38 (H) 05/31/2016 0825   LDLCALC 89 08/22/2018 0808    PHYSICAL  EXAM:    VS:  BP 120/60 (BP Location: Left Arm, Patient Position: Sitting, Cuff Size: Normal)   Pulse 63   Ht 5\' 4"  (1.626 m)   Wt 176 lb (79.8 kg)   LMP  (LMP Unknown)   SpO2 98%   BMI 30.21 kg/m   BMI: Body mass index is 30.21 kg/m.  Physical Exam  Constitutional: She is oriented to person, place,  and time. She appears well-developed and well-nourished.  HENT:  Head: Normocephalic and atraumatic.  Eyes: Right eye exhibits no discharge. Left eye exhibits no discharge.  Neck: No JVD present.  Cardiovascular: Normal rate, regular rhythm, S1 normal, S2 normal and normal heart sounds. Exam reveals no distant heart sounds, no friction rub, no midsystolic click and no opening snap.  No murmur heard. Pulses:      Dorsalis pedis pulses are 2+ on the right side and 2+ on the left side.       Posterior tibial pulses are 2+ on the right side and 2+ on the left side.  Pulmonary/Chest: Effort normal and breath sounds normal. No respiratory distress. She has no decreased breath sounds. She has no wheezes. She has no rales. She exhibits no tenderness.  Abdominal: Soft. She exhibits no distension. There is no abdominal tenderness.  Musculoskeletal:        General: No edema.     Cervical back: Normal range of motion.     Comments: Soft ankle brace noted along the left lower extremity.  Neurological: She is alert and oriented to person, place, and time.  Skin: Skin is warm and dry. No cyanosis. Nails show no clubbing.  Psychiatric: She has a normal mood and affect. Her speech is normal and behavior is normal. Judgment and thought content normal.    Wt Readings from Last 3 Encounters:  05/08/19 176 lb (79.8 kg)  03/14/19 179 lb 8 oz (81.4 kg)  11/28/18 177 lb 8 oz (80.5 kg)     ASSESSMENT & PLAN:   1. Paroxysmal SVT: She was noted to have 20 separate runs of SVT on recent outpatient cardiac monitoring with the longest episode lasting 12.1 seconds.  Discussed vagal maneuvers which can be done  at home.  Her baseline sinus heart rate is in the low 60s with an average heart rate of 64 bpm while wearing her Zio patch.  Given this, and in the setting of the infrequent nature of her supraventricular arrhythmia, we have deferred standing AV nodal blocking agent at this time.  I have prescribed Lopressor 12.5 mg twice daily as needed for sustained tachypalpitations greater than 4-5 minutes in duration.  She will call our office if she needs to take an as needed Lopressor.  Showed her episodes of SVT become more frequent or longer in duration, given her baseline sinus rate in the low 60s bpm, we would need to consider EP referral for possible SVT ablation.  I think it is reasonable for her to resume 1 diet Dr. Malachi Bonds per day.  2. Chest pain: Symptoms are overall atypical though she does have some risk factors including prior tobacco use, hyperlipidemia, and overweight.  In this setting, we have agreed to proceed with Lexiscan Myoview to evaluate for high risk ischemia.  Continue aspirin and lovastatin.  3. HLD: LDL of 89 from 08/2018 with normal LFT at that time. On lovastatin.  If she is found to have significant coronary artery calcification on CT images of her Myoview would recommend escalation to high intensity statin with goal LDL at that time being less than 70.  Followed by PCP.   Disposition: F/u with Dr. [name] or an APP in 3 months.   Medication Adjustments/Labs and Tests Ordered: Current medicines are reviewed at length with the patient today.  Concerns regarding medicines are outlined above. Medication changes, Labs and Tests ordered today are summarized above and listed in the Patient Instructions accessible in Encounters.   Signed,  Christell Faith, PA-C 05/08/2019 1:30 PM     [facility] [address] [place] [address] [place], [place] [address] [phone]

## 2019-05-08 ENCOUNTER — Ambulatory Visit (INDEPENDENT_AMBULATORY_CARE_PROVIDER_SITE_OTHER): Payer: PPO | Admitting: Physician Assistant

## 2019-05-08 ENCOUNTER — Encounter: Payer: Self-pay | Admitting: Physician Assistant

## 2019-05-08 ENCOUNTER — Other Ambulatory Visit: Payer: Self-pay

## 2019-05-08 VITALS — BP 120/60 | HR 63 | Ht 64.0 in | Wt 176.0 lb

## 2019-05-08 DIAGNOSIS — I471 Supraventricular tachycardia: Secondary | ICD-10-CM

## 2019-05-08 DIAGNOSIS — R079 Chest pain, unspecified: Secondary | ICD-10-CM | POA: Diagnosis not present

## 2019-05-08 DIAGNOSIS — E782 Mixed hyperlipidemia: Secondary | ICD-10-CM | POA: Diagnosis not present

## 2019-05-08 MED ORDER — METOPROLOL TARTRATE 25 MG PO TABS: 12.5000 mg | ORAL_TABLET | Freq: Two times a day (BID) | ORAL | 6 refills | Status: DC

## 2019-05-08 MED ORDER — METOPROLOL TARTRATE 25 MG PO TABS: 12.5000 mg | ORAL_TABLET | Freq: Two times a day (BID) | ORAL | 6 refills | Status: DC | PRN

## 2019-05-08 NOTE — Patient Instructions (Addendum)
Medication Instructions:  1- START Lopressor Take 0.5 tablets (12.5 mg total) by mouth 2 (two) times daily as needed (palpitations). *If you need a refill on your cardiac medications before your next appointment, please call your pharmacy*   Lab Work: None ordered  If you have labs (blood work) drawn today and your tests are completely normal, you will receive your results only by: Marland Kitchen MyChart Message (if you have MyChart) OR . A paper copy in the mail If you have any lab test that is abnormal or we need to change your treatment, we will call you to review the results.   Testing/Procedures: 1- [place]  Your caregiver has ordered a Stress Test with nuclear imaging. The purpose of this test is to evaluate the blood supply to your heart muscle. This procedure is referred to as a "Non-Invasive Stress Test." This is because other than having an IV started in your vein, nothing is inserted or "invades" your body. Cardiac stress tests are done to find areas of poor blood flow to the heart by determining the extent of coronary artery disease (CAD). Some patients exercise on a treadmill, which naturally increases the blood flow to your heart, while others who are  unable to walk on a treadmill due to physical limitations have a pharmacologic/chemical stress agent called Lexiscan . This medicine will mimic walking on a treadmill by temporarily increasing your coronary blood flow.   Please note: these test may take anywhere between 2-4 hours to complete  PLEASE REPORT TO [place] AT THE FIRST DESK WILL DIRECT YOU WHERE TO GO  Date of Procedure:_____________________________________  Arrival Time for Procedure:______________________________  Instructions regarding medication:   ____ : Hold diabetes medication morning of procedure  __x__:  Hold betablocker(s) night before procedure and morning of procedure (lopressor)  ____:  Hold other medications as  follows:_________________________________________________________________________________________________________________________________________________________________________________________________________________________________________________________________________________________  PLEASE NOTIFY THE OFFICE AT LEAST 24 HOURS IN ADVANCE IF YOU ARE UNABLE TO KEEP YOUR APPOINTMENT.  [phone] AND  PLEASE NOTIFY NUCLEAR MEDICINE AT [facility] AT LEAST 24 HOURS IN ADVANCE IF YOU ARE UNABLE TO KEEP YOUR APPOINTMENT. [phone]  How to prepare for your Myoview test:  1. Do not eat or drink after midnight 2. No caffeine for 24 hours prior to test 3. No smoking 24 hours prior to test. 4. Your medication may be taken with water.  If your doctor stopped a medication because of this test, do not take that medication. 5. Ladies, please do not wear dresses.  Skirts or pants are appropriate. Please wear a short sleeve shirt. 6. No perfume, cologne or lotion. 7. Wear comfortable walking shoes. No heels!        Follow-Up: At [facility], you and your health needs are our priority.  As part of our continuing mission to provide you with exceptional heart care, we have created designated Provider Care Teams.  These Care Teams include your primary Cardiologist (physician) and Advanced Practice Providers (APPs -  Physician Assistants and Nurse Practitioners) who all work together to provide you with the care you need, when you need it.  We recommend signing up for the patient portal called "MyChart".  Sign up information is provided on this After Visit Summary.  MyChart is used to connect with patients for Virtual Visits (Telemedicine).  Patients are able to view lab/test results, encounter notes, upcoming appointments, etc.  Non-urgent messages can be sent to your provider as well.   To learn more about what you  can do with MyChart, go to [email or web address].    Your next appointment:   3  month(s)  The format for your next appointment:   In Person  Provider:    You may see [name], MD or Christell Faith, PA-C.

## 2019-05-14 ENCOUNTER — Other Ambulatory Visit: Payer: Self-pay | Admitting: Family Medicine

## 2019-05-14 ENCOUNTER — Encounter: Payer: Self-pay | Admitting: Family Medicine

## 2019-05-14 DIAGNOSIS — M85859 Other specified disorders of bone density and structure, unspecified thigh: Secondary | ICD-10-CM

## 2019-05-14 DIAGNOSIS — N1831 Chronic kidney disease, stage 3a: Secondary | ICD-10-CM

## 2019-05-14 DIAGNOSIS — Z5181 Encounter for therapeutic drug level monitoring: Secondary | ICD-10-CM

## 2019-05-14 DIAGNOSIS — E782 Mixed hyperlipidemia: Secondary | ICD-10-CM

## 2019-05-14 DIAGNOSIS — Z7689 Persons encountering health services in other specified circumstances: Secondary | ICD-10-CM

## 2019-05-14 DIAGNOSIS — K219 Gastro-esophageal reflux disease without esophagitis: Secondary | ICD-10-CM

## 2019-05-14 NOTE — Progress Notes (Signed)
Labs ordered it pt would like to do prior to her appt and review results during next OV    ICD-10-CM   1. Mixed hyperlipidemia  99991111 COMPLETE METABOLIC PANEL WITH GFR    Lipid panel  2. Medication monitoring encounter  [id number] COMPLETE METABOLIC PANEL WITH GFR  3. Osteopenia of neck of femur, unspecified laterality  [id number] COMPLETE METABOLIC PANEL WITH GFR    VITAMIN D 25 Hydroxy (Vit-D Deficiency, Fractures)  4. Gastroesophageal reflux disease without esophagitis  K21.9 CBC with Differential/Platelet  5. Stage 3a chronic kidney disease  [id number] COMPLETE METABOLIC PANEL WITH GFR    VITAMIN D 25 Hydroxy (Vit-D Deficiency, Fractures)  6. Encounter to establish care with new doctor  Z76.89   7. Encounter for medication monitoring  Z51.81 CBC with Differential/Platelet    COMPLETE METABOLIC PANEL WITH GFR    Lipid panel   [name], PA-C

## 2019-05-15 ENCOUNTER — Other Ambulatory Visit: Payer: Self-pay

## 2019-05-15 ENCOUNTER — Ambulatory Visit
Admission: RE | Admit: 2019-05-15 | Discharge: 2019-05-15 | Disposition: A | Discharge status: Home / Self Care | Payer: PPO | Source: Ambulatory Visit | Attending: Physician Assistant | Admitting: Physician Assistant

## 2019-05-15 DIAGNOSIS — R079 Chest pain, unspecified: Secondary | ICD-10-CM | POA: Diagnosis not present

## 2019-05-15 LAB — NM MYOCAR MULTI W/SPECT W/WALL MOTION / EF
LV dias vol: 55 mL (ref 46–106)
LV sys vol: 24 mL
Peak HR: 82 {beats}/min
Rest HR: 60 {beats}/min
SDS: 1
SRS: 4
SSS: 1
TID: 1.03

## 2019-05-15 MED ORDER — REGADENOSON 0.4 MG/5ML IV SOLN
0.4000 mg | Freq: Once | INTRAVENOUS | Status: AC
  Administered 2019-05-15: 10:00:00 0.4 mg via INTRAVENOUS

## 2019-05-15 MED ORDER — TECHNETIUM TC 99M TETROFOSMIN IV KIT
10.0000 | PACK | Freq: Once | INTRAVENOUS | Status: AC | PRN
  Administered 2019-05-15: 09:00:00 11.085 via INTRAVENOUS

## 2019-05-15 MED ORDER — TECHNETIUM TC 99M TETROFOSMIN IV KIT
30.8980 | PACK | Freq: Once | INTRAVENOUS | Status: AC | PRN
  Administered 2019-05-15: 10:00:00 30.898 via INTRAVENOUS

## 2019-05-19 DIAGNOSIS — Z5181 Encounter for therapeutic drug level monitoring: Secondary | ICD-10-CM | POA: Diagnosis not present

## 2019-05-19 DIAGNOSIS — E782 Mixed hyperlipidemia: Secondary | ICD-10-CM | POA: Diagnosis not present

## 2019-05-19 DIAGNOSIS — N1831 Chronic kidney disease, stage 3a: Secondary | ICD-10-CM | POA: Diagnosis not present

## 2019-05-19 DIAGNOSIS — M85859 Other specified disorders of bone density and structure, unspecified thigh: Secondary | ICD-10-CM | POA: Diagnosis not present

## 2019-05-19 DIAGNOSIS — K219 Gastro-esophageal reflux disease without esophagitis: Secondary | ICD-10-CM | POA: Diagnosis not present

## 2019-05-20 LAB — LIPID PANEL
Cholesterol: 165 mg/dL (ref <200)
HDL: 48 mg/dL — ABNORMAL LOW (ref >=50)
LDL Cholesterol (Calc): 90 mg/dL (calc)
Non-HDL Cholesterol (Calc): 117 mg/dL (calc) (ref <130)
Total CHOL/HDL Ratio: 3.4 (calc) (ref <5.0)
Triglycerides: 167 mg/dL — ABNORMAL HIGH (ref <150)

## 2019-05-20 LAB — COMPLETE METABOLIC PANEL WITH GFR
AG Ratio: 2.4 (calc) (ref 1.0–2.5)
ALT: 14 U/L (ref 6–29)
AST: 16 U/L (ref 10–35)
Albumin: 3.9 g/dL (ref 3.6–5.1)
Alkaline phosphatase (APISO): 47 U/L (ref 37–153)
BUN: 14 mg/dL (ref 7–25)
CO2: 28 mmol/L (ref 20–32)
Calcium: 8.5 mg/dL — ABNORMAL LOW (ref 8.6–10.4)
Chloride: 108 mmol/L (ref 98–110)
Creat: 0.78 mg/dL (ref 0.60–0.93)
GFR, Est African American: 87 mL/min/{1.73_m2} (ref >=60)
GFR, Est Non African American: 75 mL/min/{1.73_m2} (ref >=60)
Globulin: 1.6 g/dL (calc) — ABNORMAL LOW (ref 1.9–3.7)
Glucose, Bld: 110 mg/dL — ABNORMAL HIGH (ref 65–99)
Potassium: 4.5 mmol/L (ref 3.5–5.3)
Sodium: 141 mmol/L (ref 135–146)
Total Bilirubin: 0.4 mg/dL (ref 0.2–1.2)
Total Protein: 5.5 g/dL — ABNORMAL LOW (ref 6.1–8.1)

## 2019-05-20 LAB — CBC WITH DIFFERENTIAL/PLATELET
Absolute Monocytes: 352 cells/uL (ref 200–950)
Basophils Absolute: 69 cells/uL (ref 0–200)
Basophils Relative: 1 %
Eosinophils Absolute: 311 cells/uL (ref 15–500)
Eosinophils Relative: 4.5 %
HCT: 44.7 % (ref 35.0–45.0)
Hemoglobin: 14.7 g/dL (ref 11.7–15.5)
Lymphs Abs: 3105 cells/uL (ref 850–3900)
MCH: 31 pg (ref 27.0–33.0)
MCHC: 32.9 g/dL (ref 32.0–36.0)
MCV: 94.3 fL (ref 80.0–100.0)
MPV: 10.7 fL (ref 7.5–12.5)
Monocytes Relative: 5.1 %
Neutro Abs: 3064 cells/uL (ref 1500–7800)
Neutrophils Relative %: 44.4 %
Platelets: 193 10*3/uL (ref 140–400)
RBC: 4.74 10*6/uL (ref 3.80–5.10)
RDW: 13.3 % (ref 11.0–15.0)
Total Lymphocyte: 45 %
WBC: 6.9 10*3/uL (ref 3.8–10.8)

## 2019-05-20 LAB — VITAMIN D 25 HYDROXY (VIT D DEFICIENCY, FRACTURES): Vit D, 25-Hydroxy: 28 ng/mL — ABNORMAL LOW (ref 30–100)

## 2019-05-21 ENCOUNTER — Encounter: Payer: Self-pay | Admitting: Family Medicine

## 2019-05-24 ENCOUNTER — Other Ambulatory Visit: Payer: Self-pay | Admitting: Family Medicine

## 2019-05-24 DIAGNOSIS — F411 Generalized anxiety disorder: Secondary | ICD-10-CM

## 2019-05-27 ENCOUNTER — Other Ambulatory Visit: Payer: PPO

## 2019-05-29 ENCOUNTER — Ambulatory Visit (INDEPENDENT_AMBULATORY_CARE_PROVIDER_SITE_OTHER): Payer: PPO | Admitting: Family Medicine

## 2019-05-29 ENCOUNTER — Encounter: Payer: Self-pay | Admitting: Family Medicine

## 2019-05-29 ENCOUNTER — Other Ambulatory Visit: Payer: Self-pay

## 2019-05-29 VITALS — BP 118/64 | HR 69 | Temp 98.2°F | Resp 14 | Ht 64.0 in | Wt 179.6 lb

## 2019-05-29 DIAGNOSIS — E559 Vitamin D deficiency, unspecified: Secondary | ICD-10-CM | POA: Diagnosis not present

## 2019-05-29 DIAGNOSIS — F320 Major depressive disorder, single episode, mild: Secondary | ICD-10-CM

## 2019-05-29 DIAGNOSIS — E782 Mixed hyperlipidemia: Secondary | ICD-10-CM

## 2019-05-29 DIAGNOSIS — M85859 Other specified disorders of bone density and structure, unspecified thigh: Secondary | ICD-10-CM | POA: Diagnosis not present

## 2019-05-29 DIAGNOSIS — R002 Palpitations: Secondary | ICD-10-CM

## 2019-05-29 MED ORDER — ESCITALOPRAM OXALATE 10 MG PO TABS: 10.0000 mg | ORAL_TABLET | Freq: Every day | ORAL | 3 refills | Status: DC

## 2019-05-29 MED ORDER — LOVASTATIN 20 MG PO TABS: ORAL_TABLET | ORAL | 3 refills | Status: DC

## 2019-05-29 NOTE — Progress Notes (Signed)
Name: [name]   MRN: [id number]    DOB: 02-17-1945   Date:05/29/2019       Progress Note  Chief Complaint  Patient presents with  . Follow-up  . Hypertension  . Hyperlipidemia     Subjective:   [name] is a 74 y.o. female, presents to clinic for routine follow up on the conditions listed above. She had labs previously completed, I have printed for the patient and reviewed today and exam room  Hyperlipidemia: Current Medication Regimen: Lovastatin 20 mg Last Lipids: Lab Results  Component Value Date   CHOL 165 05/19/2019   HDL 48 (L) 05/19/2019   LDLCALC 90 05/19/2019   TRIG 167 (H) 05/19/2019   CHOLHDL 3.4 05/19/2019   - Denies: Chest pain, shortness of breath, myalgias. - Risk factors for atherosclerosis: hypercholesterolemia  Hypertension:  On metoprolol for palpitations Pt reports good med compliance and denies any SE.  No lightheadedness, hypotension, syncope. Blood pressure today is well controlled. BP Readings from Last 3 Encounters:  05/29/19 118/64  05/08/19 120/60  03/14/19 128/78  Pt denies CP, SOB, exertional sx, LE edema, Ha's, visual disturbances Pt with hx of palpitations - she has palpitations PRN and was given metoprolol by Dr. [name], cardiology, they did holter monitor and chemical stress test   She has history of low calcium and low vit d, and history of osteopenia she is on a few multivitamins and different combo vitamins she is not sure of the dose for vitamin D and for calcium. With labs done last week her calcium is just out of normal range at 8.5 rr 8.6-10.4, vitamin D mildly out of range at 28 We reviewed her health maintenance including DEXA scan, what the bone density scan is looking for and also reviewed other osteoporosis prevention  MDD Patient has improving depressive symptoms still positive PHQ-9 screening today she still having difficulty with sleep but overall her energy appetite and mood have improved slightly and her  stress/anxiety has improved  Depression screen [facility] 2/9 05/29/2019 11/28/2018 10/15/2018  Decreased Interest 1 1 2   Down, Depressed, Hopeless 1 2 1   PHQ - 2 Score 2 3 3   Altered sleeping 3 3 3   Tired, decreased energy 1 3 2   Change in appetite 1 3 0  Feeling bad or failure about yourself  1 1 1   Trouble concentrating 1 1 0  Moving slowly or fidgety/restless 0 0 0  Suicidal thoughts 0 0 0  PHQ-9 Score 9 14 9   Difficult doing work/chores Somewhat difficult Somewhat difficult Somewhat difficult  Some recent data might be hidden  Patient is taking lexapro 10 mg daily, no SE or concerns, does report that its helping with her stress    Patient Active Problem List   Diagnosis Date Noted  . Palpitations 03/14/2019  . Atypical chest pain 03/14/2019  . Right arm weakness 03/14/2019  . Current mild episode of major depressive disorder without prior episode ([place]) 10/15/2018  . GAD (generalized anxiety disorder) 10/15/2018  . Personal history of colonic polyps   . Polyp of transverse colon   . Obesity (BMI 30.0-34.9) 05/29/2018  . GERD (gastroesophageal reflux disease) 11/27/2017  . Osteopenia 11/27/2017  . Hyperlipidemia 12/09/2014  . Insomnia 12/09/2014  . Anxiety as acute reaction to exceptional stress 12/09/2014    Past Surgical History:  Procedure Laterality Date  . BREAST BIOPSY Right    core bx- neg  . COLONOSCOPY    . COLONOSCOPY WITH PROPOFOL N/A 10/08/2018  Procedure: COLONOSCOPY WITH PROPOFOL;  Surgeon: Lucilla Lame, MD;  Location[facility] ENDOSCOPY;  Service: Endoscopy;  Laterality: N/A;  . MOHS SURGERY    . TONSILLECTOMY    . VAGINAL HYSTERECTOMY      Family History  Problem Relation Age of Onset  . Colon cancer Paternal Uncle   . Rectal cancer Paternal Uncle   . Cancer Mother        lung   . Diabetes Father   . Parkinson's disease Father   . Alzheimer's disease Father   . Heart attack Father 9  . Alcohol abuse Sister   . Heart disease Sister   . Lung cancer  Sister   . Cancer Son        livcolon, liver stage 4  . Liver cancer Son   . Stroke Paternal Grandmother   . Cancer Paternal Grandfather        lung  . Down syndrome Sister   . Esophageal cancer Neg Hx   . Stomach cancer Neg Hx   . Breast cancer Neg Hx     Social History   Tobacco Use  . Smoking status: Former Smoker    Packs/day: 1.50    Years: 20.00    Pack years: 30.00    Types: Cigarettes    Quit date: 1993    Years since quitting: 28.3  . Smokeless tobacco: Never Used  Substance Use Topics  . Alcohol use: Not Currently  . Drug use: Never      Current Outpatient Medications:  .  aspirin EC 81 MG tablet, Take 1 tablet (81 mg total) by mouth daily., Disp: , Rfl:  .  calcium carbonate (OS-CAL - DOSED IN MG OF ELEMENTAL CALCIUM) 1250 (500 Ca) MG tablet, Take 1 tablet by mouth daily with breakfast., Disp: , Rfl:  .  escitalopram (LEXAPRO) 10 MG tablet, Take 10 mg by mouth daily., Disp: , Rfl:  .  famotidine (PEPCID) 10 MG tablet, Take 1 tablet (10 mg total) by mouth 2 (two) times daily as needed for heartburn or indigestion., Disp: , Rfl:  .  GLUCOSAMINE-CHONDROIT-CALCIUM PO, Take 1,000 mg by mouth daily., Disp: , Rfl:  .  hydrOXYzine (ATARAX/VISTARIL) 25 MG tablet, Take 1-2 tablets (25-50 mg total) by mouth at bedtime as needed (insomnia)., Disp: 60 tablet, Rfl: 0 .  lovastatin (MEVACOR) 20 MG tablet, TAKE 1 TABLET BY MOUTH EVERYDAY AT BEDTIME, Disp: 90 tablet, Rfl: 1 .  metoprolol tartrate (LOPRESSOR) 25 MG tablet, Take 0.5 tablets (12.5 mg total) by mouth 2 (two) times daily as needed (palpitations)., Disp: 30 tablet, Rfl: 6 .  Multiple Vitamin (MULTIVITAMIN) tablet, Take 1 tablet by mouth daily., Disp: , Rfl:  .  vitamin C (ASCORBIC ACID) 500 MG tablet, Take 500 mg by mouth daily., Disp: , Rfl:  .  vitamin B-12 (CYANOCOBALAMIN) 1000 MCG tablet, Take 1,000 mcg by mouth daily., Disp: , Rfl:   Allergies  Allergen Reactions  . Tetanus Toxoids Shortness Of Breath  .  Bromsulphalein   . Sulfa Antibiotics     Severe vomiting    Chart Review Today: I personally reviewed active problem list, medication list, allergies, family history, social history, health maintenance, notes from last encounter, lab results, imaging with the patient/caregiver today.   Review of Systems  10 Systems reviewed and are negative for acute change except as noted in the HPI.  Objective:    Vitals:   05/29/19 1122  BP: 118/64  Pulse: 69  Resp: 14  Temp: 98.2 F (  36.8 C)  SpO2: 96%  Weight: 179 lb 9.6 oz (81.5 kg)  Height: 5\' 4"  (1.626 m)    Body mass index is 30.83 kg/m.  Physical Exam Vitals and nursing note reviewed.  Constitutional:      General: She is not in acute distress.    Appearance: Normal appearance. She is well-developed and overweight. She is not ill-appearing, toxic-appearing or diaphoretic.     Interventions: Face mask in place.  HENT:     Head: Normocephalic and atraumatic.     Right Ear: External ear normal.     Left Ear: External ear normal.  Eyes:     General: Lids are normal. No scleral icterus.       Right eye: No discharge.        Left eye: No discharge.     Conjunctiva/sclera: Conjunctivae normal.  Neck:     Trachea: Phonation normal. No tracheal deviation.  Cardiovascular:     Rate and Rhythm: Normal rate and regular rhythm.     Pulses: Normal pulses.          Radial pulses are 2+ on the right side and 2+ on the left side.       Posterior tibial pulses are 2+ on the right side and 2+ on the left side.     Heart sounds: Normal heart sounds. No murmur. No friction rub. No gallop.   Pulmonary:     Effort: Pulmonary effort is normal. No respiratory distress.     Breath sounds: Normal breath sounds. No stridor. No wheezing, rhonchi or rales.  Chest:     Chest wall: No tenderness.  Abdominal:     General: Bowel sounds are normal. There is no distension.     Palpations: Abdomen is soft.     Tenderness: There is no abdominal  tenderness. There is no guarding or rebound.  Musculoskeletal:        General: No deformity. Normal range of motion.     Cervical back: Normal range of motion and neck supple.     Right lower leg: No edema.     Left lower leg: No edema.  Lymphadenopathy:     Cervical: No cervical adenopathy.  Skin:    General: Skin is warm and dry.     Capillary Refill: Capillary refill takes less than 2 seconds.     Coloration: Skin is not jaundiced or pale.     Findings: No rash.  Neurological:     Mental Status: She is alert and oriented to person, place, and time.     Motor: No abnormal muscle tone.     Gait: Gait normal.  Psychiatric:        Speech: Speech normal.        Behavior: Behavior normal. Behavior is cooperative.       Fall Risk: Fall Risk  05/29/2019 11/28/2018 10/15/2018 09/27/2018 05/29/2018  Falls in the past year? 0 1 0 1 0  Comment - - - - -  Number falls in past yr: 0 1 0 0 -  Injury with Fall? 0 0 0 0 -  Risk for fall due to : - - - - -  Risk for fall due to: Comment - - - - -  Follow up - - - Falls prevention discussed -    Functional Status Survey: Is the patient deaf or have difficulty hearing?: No Does the patient have difficulty seeing, even when wearing glasses/contacts?: No Does the patient have difficulty concentrating, remembering, or  making decisions?: No Does the patient have difficulty walking or climbing stairs?: No Does the patient have difficulty dressing or bathing?: No Does the patient have difficulty doing errands alone such as visiting a doctor's office or shopping?: No   Assessment & Plan:     ICD-10-CM   1. Mixed hyperlipidemia  E78.2 lovastatin (MEVACOR) 20 MG tablet   Compliant with statin no myalgias, encouraged healthy diet and lifestyle  2. Palpitations  R00.2    Per cardiology managing with metoprolol as needed she feels that this is effective denies any side effects  3. Osteopenia of neck of femur, unspecified laterality  M85.859     Encouraged weightbearing activity, vitamin D and calcium supplement daily, spent a lot of time reviewing her past bone scan and osteoporosis prevention today  4. Current mild episode of major depressive disorder without prior episode (HCC) Chronic F32.0 escitalopram (LEXAPRO) 10 MG tablet   Continued mild symptoms but improved since last visit continue Lexapro  5. Vitamin D deficiency  E55.9    Patient encouraged to be compliant with a daily vitamin D supplement, last labs still mildly low  6. Hypocalcemia  E83.51    Mildly low calcium encouraged her to check her supplements at home for dosing of calcium and vitamin D and to take daily     Return for 6 month f/up MDD/HLD/vit d/calcium osteopenia .   [name], PA-C 05/29/19 11:45 AM

## 2019-05-29 NOTE — Patient Instructions (Addendum)
Make sure you are taking calcium 1200 mg and Vit D 3 1000 IU daily between supplements (multivitamin and vit d/calc combo pill)  Dexa is due next year July   Preventing Osteoporosis, Adult Osteoporosis is a condition that causes the bones to lose density. This means that the bones become thinner, and the normal spaces in bone tissue become larger. Low bone density can make the bones weak and cause them to break more easily. Osteoporosis cannot always be prevented, but you can take steps to lower your risk of developing this condition. How can this condition affect me? If you develop osteoporosis, you will be more likely to break bones in your wrist, spine, or hip. Even a minor accident or injury can be enough to break weak bones. The bones will also be slower to heal. Osteoporosis can cause other problems as well, such as a stooped posture or trouble with movement. Osteoporosis can occur with aging. As you get older, you may lose bone tissue more quickly, or it may be replaced more slowly. Osteoporosis is more likely to develop if you have poor nutrition or do not get enough calcium or vitamin D. Other lifestyle factors can also play a role. By eating a well-balanced diet and making lifestyle changes, you can help keep your bones strong and healthy, lowering your chances of developing osteoporosis. What can increase my risk? The following factors may make you more likely to develop osteoporosis:  Having a family history of the condition.  Having poor nutrition or not getting enough calcium or vitamin D.  Using certain medicines, such as steroid medicines or antiseizure medicines.  Being any of the following: ? 57 years of age or older. ? Female. ? A woman who has gone through menopause (is postmenopausal). ? White (Caucasian) or of Asian descent.  Smoking or having a history of smoking.  Not being physically active (being sedentary).  Having a small body frame. What actions can I take  to prevent this?  Get enough calcium   Make sure you get enough calcium every day. Calcium is the most important mineral for bone health. Most people can get enough calcium from their diet, but supplements may be recommended for people who are at risk for osteoporosis. Follow these guidelines: ? If you are age 34 or younger, aim to get 1,000 mg of calcium every day. ? If you are older than age 20, aim to get 1,200 mg of calcium every day.  Good sources of calcium include: ? Dairy products, such as low-fat or nonfat milk, cheese, and yogurt. ? Dark green leafy vegetables, such as bok choy and broccoli. ? Foods that have had calcium added to them (calcium-fortified foods), such as orange juice, cereal, bread, soy beverages, and tofu products. ? Nuts, such as almonds.  Check nutrition labels to see how much calcium is in a food or drink. Get enough vitamin D  Try to get enough vitamin D every day. Vitamin D is the most essential vitamin for bone health. It helps the body absorb calcium. Follow these guidelines for how much vitamin D to get from food: ? If you are age 41 or younger, aim to get at least 600 international units (IU) every day. Your health care provider may suggest more. ? If you are older than age 70, aim to get at least 800 international units every day. Your health care provider may suggest more.  Good sources of vitamin D in your diet include: ? Egg yolks. ? Oily  fish, such as salmon, sardines, and tuna. ? Milk and cereal fortified with vitamin D.  Your body also makes vitamin D when you are out in the sun. Exposing the bare skin on your face, arms, legs, or back to the sun for no more than 30 minutes a day, 2 times a week is more than enough. Beyond that, make sure you use sunblock to protect your skin from sunburn, which increases your risk for skin cancer. Exercise  Stay active and get exercise every day.  Ask your health care provider what types of exercise are best  for you. Weight-bearing and strength-building activities are important for building and maintaining healthy bones. Some examples of these types of activities include: ? Walking and hiking. ? Jogging and running. ? Dancing. ? Gym exercises. ? Lifting weights. ? Tennis and racquetball. ? Climbing stairs. ? Aerobics. Make other lifestyle changes  Do not use any products that contain nicotine or tobacco, such as cigarettes, e-cigarettes, and chewing tobacco. If you need help quitting, ask your health care provider.  Lose weight if you are overweight.  If you drink alcohol: ? Limit how much you use to:  0-1 drink a day for nonpregnant women.  0-2 drinks a day for men. ? Be aware of how much alcohol is in your drink. In the [place] one drink equals one 12 oz bottle of beer (355 mL), one 5 oz glass of wine (148 mL), or one 1 oz glass of hard liquor (44 mL). Where to find support If you need help making changes to prevent osteoporosis, talk with your health care provider. You can ask for a referral to a diet and nutrition specialist (dietitian) and a physical therapist. Where to find more information Learn more about osteoporosis from:  NIH Osteoporosis and Related [place]: www.bones.SouthExposed.es  [place]S. Office on [employer] Health: [place]s.tn  [place]: EquipmentWeekly.com.ee Summary  Osteoporosis is a condition that causes weak bones that are more likely to break.  Eat a healthy diet, making sure you get enough calcium and vitamin D, and stay active by getting regular exercise to help prevent osteoporosis.  Other ways to reduce your risk of osteoporosis include maintaining a healthy weight and avoiding alcohol and products that contain nicotine or tobacco. This information is not intended to replace advice given to you by your health care provider. Make sure you discuss any questions you have with your health care provider. Document  Revised: 08/30/2018 Document Reviewed: 08/30/2018 Elsevier Patient Education  [place].

## 2019-05-30 ENCOUNTER — Ambulatory Visit: Payer: PPO | Admitting: Family Medicine

## 2019-05-30 ENCOUNTER — Ambulatory Visit: Payer: PPO | Admitting: Physician Assistant

## 2019-06-02 ENCOUNTER — Ambulatory Visit
Admission: RE | Admit: 2019-06-02 | Discharge: 2019-06-02 | Disposition: A | Discharge status: Home / Self Care | Payer: PPO | Source: Ambulatory Visit | Attending: Family Medicine | Admitting: Family Medicine

## 2019-06-02 DIAGNOSIS — Z1231 Encounter for screening mammogram for malignant neoplasm of breast: Secondary | ICD-10-CM | POA: Diagnosis not present

## 2019-06-10 ENCOUNTER — Encounter: Payer: Self-pay | Admitting: Family Medicine

## 2019-06-10 DIAGNOSIS — E559 Vitamin D deficiency, unspecified: Secondary | ICD-10-CM | POA: Insufficient documentation

## 2019-06-12 ENCOUNTER — Other Ambulatory Visit: Payer: Self-pay

## 2019-06-12 ENCOUNTER — Ambulatory Visit: Payer: PPO | Admitting: Dermatology

## 2019-06-12 DIAGNOSIS — L918 Other hypertrophic disorders of the skin: Secondary | ICD-10-CM | POA: Diagnosis not present

## 2019-06-12 DIAGNOSIS — L814 Other melanin hyperpigmentation: Secondary | ICD-10-CM

## 2019-06-12 DIAGNOSIS — L578 Other skin changes due to chronic exposure to nonionizing radiation: Secondary | ICD-10-CM

## 2019-06-12 DIAGNOSIS — L570 Actinic keratosis: Secondary | ICD-10-CM | POA: Diagnosis not present

## 2019-06-12 DIAGNOSIS — Z1283 Encounter for screening for malignant neoplasm of skin: Secondary | ICD-10-CM | POA: Diagnosis not present

## 2019-06-12 DIAGNOSIS — L820 Inflamed seborrheic keratosis: Secondary | ICD-10-CM | POA: Diagnosis not present

## 2019-06-12 DIAGNOSIS — L821 Other seborrheic keratosis: Secondary | ICD-10-CM

## 2019-06-12 DIAGNOSIS — Z85828 Personal history of other malignant neoplasm of skin: Secondary | ICD-10-CM

## 2019-06-12 DIAGNOSIS — D229 Melanocytic nevi, unspecified: Secondary | ICD-10-CM | POA: Diagnosis not present

## 2019-06-12 DIAGNOSIS — D1800 Hemangioma unspecified site: Secondary | ICD-10-CM

## 2019-06-12 NOTE — Progress Notes (Signed)
Follow-Up Visit   Subjective  [name] is a 74 y.o. female who presents for the following: Annual Exam (patient has an irritated skin tag that rubs on her bra - L axilla ). Patient presents for skin cancer screening, mole check and total-body skin examination today.  The following portions of the chart were reviewed this encounter and updated as appropriate:     Review of Systems:  No other skin or systemic complaints except as noted in HPI or Assessment and Plan.  Objective  Well appearing patient in no apparent distress; mood and affect are within normal limits.  A full examination was performed including scalp, head, eyes, ears, nose, lips, neck, chest, axillae, abdomen, back, buttocks, bilateral upper extremities, bilateral lower extremities, hands, feet, fingers, toes, fingernails, and toenails. All findings within normal limits unless otherwise noted below.  Objective  L nose x 1: Erythematous thin papules/macules with gritty scale.   Objective  R upper eyelid x 1: Erythematous keratotic or waxy stuck-on papule or plaque.    Assessment & Plan  AK (actinic keratosis) L nose x 1  Destruction of lesion - L nose x 1 Complexity: simple   Destruction method: cryotherapy   Informed consent: discussed and consent obtained   Timeout:  patient name, date of birth, surgical site, and procedure verified Lesion destroyed using liquid nitrogen: Yes   Region frozen until ice ball extended beyond lesion: Yes   Outcome: patient tolerated procedure well with no complications   Post-procedure details: wound care instructions given    Inflamed seborrheic keratosis R upper eyelid x 1  Destruction of lesion - R upper eyelid x 1 Complexity: simple   Destruction method: cryotherapy   Informed consent: discussed and consent obtained   Timeout:  patient name, date of birth, surgical site, and procedure verified Lesion destroyed using liquid nitrogen: Yes   Region frozen until  ice ball extended beyond lesion: Yes   Outcome: patient tolerated procedure well with no complications   Post-procedure details: wound care instructions given     Seborrheic Keratoses - Stuck-on, waxy, tan-brown papules and plaques  - Discussed benign etiology and prognosis. - Observe - Call for any changes  Actinic Damage - diffuse scaly erythematous macules with underlying dyspigmentation - Recommend daily broad spectrum sunscreen SPF 30+ to sun-exposed areas, reapply every 2 hours as needed.  - Call for new or changing lesions.  Lentigines - Scattered tan macules - Discussed due to sun exposure - Benign, observe - Call for any changes  Hemangiomas - Red papules - Discussed benign nature - Observe - Call for any changes  Melanocytic Nevi - Tan-brown and/or pink-flesh-colored symmetric macules and papules - Benign appearing on exam today - Observation - Call clinic for new or changing moles - Recommend daily use of broad spectrum spf 30+ sunscreen to sun-exposed areas.   History of Basal Cell Carcinoma of the Skin - No evidence of recurrence today - Recommend regular full body skin exams - Recommend daily broad spectrum sunscreen SPF 30+ to sun-exposed areas, reapply every 2 hours as needed.  - Call if any new or changing lesions are noted between office visits  Acrochordons (Skin Tags) - irritated, rubs on bra, history of bleeding - Fleshy, skin-colored pedunculated papule - Benign appearing.  - Prior to the procedure, reviewed the expected small wound. Also reviewed the risk of leaving a small scar and the small risk of infection.  PROCEDURE - The areas were prepped with isopropyl alcohol. A small  amount of lidocaine 1% with epinephrine was injected at the base of each lesion to achieve good local anesthesia. The skin tags were removed using a snip technique. Aluminum chloride was used for hemostasis. Petrolatum and a bandage were applied. The procedure was tolerated  well. - Wound care was reviewed with the patient. They were advised to call with any concerns. Total number of treated acrochordons x 1  Return in about 1 year (around 06/11/2020) for TBSE.  Luther Redo, CMA, am acting as scribe for Sarina Ser, MD .  Documentation: I have reviewed the above documentation for accuracy and completeness, and I agree with the above.  Sarina Ser, MD

## 2019-06-13 ENCOUNTER — Encounter: Payer: Self-pay | Admitting: Dermatology

## 2019-06-20 ENCOUNTER — Other Ambulatory Visit: Payer: Self-pay | Admitting: Family Medicine

## 2019-06-20 DIAGNOSIS — F411 Generalized anxiety disorder: Secondary | ICD-10-CM

## 2019-07-02 ENCOUNTER — Telehealth: Payer: Self-pay | Admitting: Family Medicine

## 2019-07-02 NOTE — Chronic Care Management (AMB) (Signed)
  Chronic Care Management   Outreach Note  07/02/2019 Name: NOHEA GRAF MRN: [id number] DOB: [birth year]  JAYONNA KOEN is a 74 y.o. year old female who is a primary care patient of [name], [place]. I reached out to [name] by phone today in response to a referral sent by Ms. Reita Chard Schroeter's health plan.     An unsuccessful telephone outreach was attempted today. The patient was referred to the case management team for assistance with care management and care coordination.   Follow Up Plan:Pt answered and states that she is out of town call back next week.  The care management team will reach out to the patient again over the next 7 days.  If patient returns call to provider office, please advise to call [facility]  at [place], [place], [place], [place] [address] Direct Dial: [phone] [email or web address]

## 2019-07-15 NOTE — Chronic Care Management (AMB) (Signed)
Chronic Care Management  ° °Note ° °07/15/2019 °Name: [name] MRN: [id number] DOB: 11/29/1945 ° °[name] is a 74 y.o. year old female who is a primary care patient of Tapia, Leisa, PA-C. I reached out to [name] by phone today in response to a referral sent by [name]'s health plan.    ° °[name] was given information about Chronic Care Management services today including:  °1. CCM service includes personalized support from designated clinical staff supervised by her physician, including individualized plan of care and coordination with other care providers °2. 24/7 contact phone numbers for assistance for urgent and routine care needs. °3. Service will only be billed when office clinical staff spend 20 minutes or more in a month to coordinate care. °4. Only one practitioner may furnish and bill the service in a calendar month. °5. The patient may stop CCM services at any time (effective at the end of the month) by phone call to the office staff. °6. The patient will be responsible for cost sharing (co-pay) of up to 20% of the service fee (after annual deductible is met). ° °Patient did not agree to enrollment in care management services and does not wish to consider at this time. ° °Follow up plan: °The patient has been provided with contact information for the care management team and has been advised to call with any health related questions or concerns.  ° °Amber Wray, RMA °Care Guide, Embedded Care Coordination °[facility]  °[place], [place] [address] °Direct Dial: [phone] °[email or web address] °Website: [email or web address]  °

## 2019-08-13 ENCOUNTER — Encounter: Payer: Self-pay | Admitting: Internal Medicine

## 2019-08-13 ENCOUNTER — Other Ambulatory Visit: Payer: Self-pay

## 2019-08-13 ENCOUNTER — Ambulatory Visit: Payer: PPO | Admitting: Internal Medicine

## 2019-08-13 VITALS — BP 120/60 | HR 63 | Ht 64.0 in | Wt 180.0 lb

## 2019-08-13 DIAGNOSIS — R002 Palpitations: Secondary | ICD-10-CM | POA: Diagnosis not present

## 2019-08-13 DIAGNOSIS — I471 Supraventricular tachycardia: Secondary | ICD-10-CM | POA: Diagnosis not present

## 2019-08-13 DIAGNOSIS — E782 Mixed hyperlipidemia: Secondary | ICD-10-CM

## 2019-08-13 NOTE — Patient Instructions (Signed)
Medication Instructions:  Your physician recommends that you continue on your current medications as directed. Please refer to the Current Medication list given to you today.  *If you need a refill on your cardiac medications before your next appointment, please call your pharmacy*  Follow-Up: At [facility], you and your health needs are our priority.  As part of our continuing mission to provide you with exceptional heart care, we have created designated Provider Care Teams.  These Care Teams include your primary Cardiologist (physician) and Advanced Practice Providers (APPs -  Physician Assistants and Nurse Practitioners) who all work together to provide you with the care you need, when you need it.  We recommend signing up for the patient portal called "MyChart".  Sign up information is provided on this After Visit Summary.  MyChart is used to connect with patients for Virtual Visits (Telemedicine).  Patients are able to view lab/test results, encounter notes, upcoming appointments, etc.  Non-urgent messages can be sent to your provider as well.   To learn more about what you can do with MyChart, go to [email or web address].    Your next appointment:   12 month(s)  The format for your next appointment:   In Person  Provider:    You may see Christopher End, MD or one of the following Advanced Practice Providers on your designated Care Team:    Christopher Berge, NP  Ryan Dunn, PA-C  Jacquelyn Visser, PA-C 
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
## 2019-08-14 ENCOUNTER — Encounter: Payer: Self-pay | Admitting: Internal Medicine

## 2019-08-14 DIAGNOSIS — I471 Supraventricular tachycardia: Secondary | ICD-10-CM | POA: Insufficient documentation

## 2019-10-02 ENCOUNTER — Ambulatory Visit: Payer: PPO

## 2019-11-17 ENCOUNTER — Other Ambulatory Visit: Payer: Self-pay

## 2019-11-17 DIAGNOSIS — E782 Mixed hyperlipidemia: Secondary | ICD-10-CM | POA: Diagnosis not present

## 2019-11-17 DIAGNOSIS — E559 Vitamin D deficiency, unspecified: Secondary | ICD-10-CM

## 2019-11-17 DIAGNOSIS — Z5181 Encounter for therapeutic drug level monitoring: Secondary | ICD-10-CM

## 2019-11-17 LAB — VITAMIN D 25 HYDROXY (VIT D DEFICIENCY, FRACTURES): Vit D, 25-Hydroxy: 38 ng/mL (ref 30–100)

## 2019-11-17 LAB — COMPLETE METABOLIC PANEL WITH GFR
AG Ratio: 2.2 (calc) (ref 1.0–2.5)
ALT: 13 U/L (ref 6–29)
AST: 17 U/L (ref 10–35)
Albumin: 4.1 g/dL (ref 3.6–5.1)
Alkaline phosphatase (APISO): 56 U/L (ref 37–153)
BUN: 10 mg/dL (ref 7–25)
CO2: 28 mmol/L (ref 20–32)
Calcium: 9.3 mg/dL (ref 8.6–10.4)
Chloride: 105 mmol/L (ref 98–110)
Creat: 0.91 mg/dL (ref 0.60–0.93)
GFR, Est African American: 72 mL/min/{1.73_m2} (ref >=60)
GFR, Est Non African American: 62 mL/min/{1.73_m2} (ref >=60)
Globulin: 1.9 g/dL (calc) (ref 1.9–3.7)
Glucose, Bld: 100 mg/dL — ABNORMAL HIGH (ref 65–99)
Potassium: 4.3 mmol/L (ref 3.5–5.3)
Sodium: 141 mmol/L (ref 135–146)
Total Bilirubin: 0.6 mg/dL (ref 0.2–1.2)
Total Protein: 6 g/dL — ABNORMAL LOW (ref 6.1–8.1)

## 2019-11-17 LAB — LIPID PANEL
Cholesterol: 174 mg/dL (ref <200)
HDL: 49 mg/dL — ABNORMAL LOW (ref >=50)
LDL Cholesterol (Calc): 90 mg/dL (calc)
Non-HDL Cholesterol (Calc): 125 mg/dL (calc) (ref <130)
Total CHOL/HDL Ratio: 3.6 (calc) (ref <5.0)
Triglycerides: 269 mg/dL — ABNORMAL HIGH (ref <150)

## 2019-11-20 DIAGNOSIS — H2513 Age-related nuclear cataract, bilateral: Secondary | ICD-10-CM | POA: Diagnosis not present

## 2019-11-28 ENCOUNTER — Ambulatory Visit: Payer: PPO | Admitting: Family Medicine

## 2019-12-03 ENCOUNTER — Telehealth: Payer: Self-pay | Admitting: *Deleted

## 2019-12-03 ENCOUNTER — Ambulatory Visit (INDEPENDENT_AMBULATORY_CARE_PROVIDER_SITE_OTHER): Payer: PPO | Admitting: Family Medicine

## 2019-12-03 ENCOUNTER — Encounter: Payer: Self-pay | Admitting: Family Medicine

## 2019-12-03 ENCOUNTER — Other Ambulatory Visit: Payer: Self-pay

## 2019-12-03 VITALS — BP 128/78 | HR 78 | Temp 98.0°F | Resp 16 | Ht 64.0 in | Wt 180.6 lb

## 2019-12-03 DIAGNOSIS — R7301 Impaired fasting glucose: Secondary | ICD-10-CM

## 2019-12-03 DIAGNOSIS — N390 Urinary tract infection, site not specified: Secondary | ICD-10-CM

## 2019-12-03 DIAGNOSIS — F439 Reaction to severe stress, unspecified: Secondary | ICD-10-CM

## 2019-12-03 DIAGNOSIS — F320 Major depressive disorder, single episode, mild: Secondary | ICD-10-CM | POA: Diagnosis not present

## 2019-12-03 DIAGNOSIS — N1831 Chronic kidney disease, stage 3a: Secondary | ICD-10-CM

## 2019-12-03 DIAGNOSIS — K219 Gastro-esophageal reflux disease without esophagitis: Secondary | ICD-10-CM

## 2019-12-03 DIAGNOSIS — R32 Unspecified urinary incontinence: Secondary | ICD-10-CM

## 2019-12-03 DIAGNOSIS — I471 Supraventricular tachycardia: Secondary | ICD-10-CM | POA: Diagnosis not present

## 2019-12-03 DIAGNOSIS — R351 Nocturia: Secondary | ICD-10-CM

## 2019-12-03 DIAGNOSIS — E559 Vitamin D deficiency, unspecified: Secondary | ICD-10-CM

## 2019-12-03 DIAGNOSIS — M85859 Other specified disorders of bone density and structure, unspecified thigh: Secondary | ICD-10-CM | POA: Diagnosis not present

## 2019-12-03 DIAGNOSIS — E782 Mixed hyperlipidemia: Secondary | ICD-10-CM

## 2019-12-03 LAB — POCT URINALYSIS DIPSTICK
Blood, UA: NEGATIVE
Glucose, UA: NEGATIVE
Ketones, UA: NEGATIVE
Nitrite, UA: NEGATIVE
Protein, UA: NEGATIVE
Spec Grav, UA: 1.015 (ref 1.010–1.025)
Urobilinogen, UA: 0.2 E.U./dL
pH, UA: 6 (ref 5.0–8.0)

## 2019-12-03 NOTE — Patient Instructions (Addendum)
Look up Glycemic index and try to eat foods lower on the glycemic index to help improve your blood sugar and triglycerides  Keep track of your urinary symptoms We will call you with results and if they are negative I suggest working on suggestions below, keeping journal, and following up with a specialist for further evaluation.  Urinary Frequency, Adult Urinary frequency means urinating more often than usual. You may urinate every 1-2 hours even though you drink a normal amount of fluid and do not have a bladder infection or condition. Although you urinate more often than normal, the total amount of urine produced in a day is normal. With urinary frequency, you may have an urgent need to urinate often. The stress and anxiety of needing to find a bathroom quickly can make this urge worse. This condition may go away on its own or you may need treatment at home. Home treatment may include bladder training, exercises, taking medicines, or making changes to your diet. Follow these instructions at home: Bladder health   Keep a bladder diary if told by your health care provider. Keep track of: ? What you eat and drink. ? How often you urinate. ? How much you urinate.  Follow a bladder training program if told by your health care provider. This may include: ? Learning to delay going to the bathroom. ? Double urinating (voiding). This helps if you are not completely emptying your bladder. ? Scheduled voiding.  Do Kegel exercises as told by your health care provider. Kegel exercises strengthen the muscles that help control urination, which may help the condition. Eating and drinking  If told by your health care provider, make diet changes, such as: ? Avoiding caffeine. ? Drinking fewer fluids, especially alcohol. ? Not drinking in the evening. ? Avoiding foods or drinks that may irritate the bladder. These include coffee, tea, soda, artificial sweeteners, citrus, tomato-based foods, and  chocolate. ? Eating foods that help prevent or ease constipation. Constipation can make this condition worse. Your health care provider may recommend that you:  Drink enough fluid to keep your urine pale yellow.  Take over-the-counter or prescription medicines.  Eat foods that are high in fiber, such as beans, whole grains, and fresh fruits and vegetables.  Limit foods that are high in fat and processed sugars, such as fried or sweet foods. General instructions  Take over-the-counter and prescription medicines only as told by your health care provider.  Keep all follow-up visits as told by your health care provider. This is important. Contact a health care provider if:  You start urinating more often.  You feel pain or irritation when you urinate.  You notice blood in your urine.  Your urine looks cloudy.  You develop a fever.  You begin vomiting. Get help right away if:  You are unable to urinate. Summary  Urinary frequency means urinating more often than usual. With urinary frequency, you may urinate every 1-2 hours even though you drink a normal amount of fluid and do not have a bladder infection or other bladder condition.  Your health care provider may recommend that you keep a bladder diary, follow a bladder training program, or make dietary changes.  If told by your health care provider, do Kegel exercises to strengthen the muscles that help control urination.  Take over-the-counter and prescription medicines only as told by your health care provider.  Contact a health care provider if your symptoms do not improve or get worse. This information is not intended  to replace advice given to you by your health care provider. Make sure you discuss any questions you have with your health care provider. Document Revised: 08/09/2017 Document Reviewed: 08/09/2017 Elsevier Patient Education  [place].

## 2019-12-03 NOTE — Progress Notes (Signed)
Name: [name]   MRN: [id number]    DOB: Oct 08, 1945   Date:12/03/2019       Progress Note  Chief Complaint  Patient presents with  . Follow-up  . Medication Refill  . Urinary Incontinence    with urgency     Subjective:   [name] is a 74 y.o. female, presents to clinic for routine f/up  Urinary sx, urge, frequency, nocturia and incontinence Onset months ago  Hyperlipidemia: Currently treated with lovastatin 20 mg, pt reports  med compliance Last Lipids: Lab Results  Component Value Date   CHOL 174 11/17/2019   HDL 49 (L) 11/17/2019   LDLCALC 90 11/17/2019   TRIG 269 (H) 11/17/2019   CHOLHDL 3.6 11/17/2019  - Denies: Chest pain, shortness of breath, myalgias, claudication  Hypertension:  Palpitations not on metoprolol  Pt takes her meds when she has them - rx per cardiology Blood pressure today is well controlled. BP Readings from Last 3 Encounters:  12/03/19 128/78  08/13/19 120/60  05/29/19 118/64   Pt denies CP, SOB, exertional sx, LE edema, Ha's, visual disturbances, lightheadedness, hypotension, syncope.  Increase situational stress with husbands health and caring for him, dementia Depression screen [facility] 2/9 12/03/2019 12/03/2019 05/29/2019  Decreased Interest 1 1 1   Down, Depressed, Hopeless 1 1 1   PHQ - 2 Score 2 2 2   Altered sleeping 2 - 3  Tired, decreased energy 1 - 1  Change in appetite 2 - 1  Feeling bad or failure about yourself  1 - 1  Trouble concentrating 0 - 1  Moving slowly or fidgety/restless 0 - 0  Suicidal thoughts 0 - 0  PHQ-9 Score 8 - 9  Difficult doing work/chores Somewhat difficult - Somewhat difficult  Some recent data might be hidden   On lexapro 10 mg and hydroxyzine 25-50 to help with sleep, worse sleep with urinary sx  A lot going on with husbands medical issues, stroke and parkinsons     Current Outpatient Medications:  .  aspirin EC 81 MG tablet, Take 1 tablet (81 mg total) by mouth daily. (Patient not  taking: Reported on 12/03/2019), Disp: , Rfl:  .  calcium carbonate (OS-CAL - DOSED IN MG OF ELEMENTAL CALCIUM) 1250 (500 Ca) MG tablet, Take 1 tablet by mouth daily with breakfast., Disp: , Rfl:  .  escitalopram (LEXAPRO) 10 MG tablet, Take 1 tablet (10 mg total) by mouth daily., Disp: 90 tablet, Rfl: 3 .  famotidine (PEPCID) 10 MG tablet, Take 1 tablet (10 mg total) by mouth 2 (two) times daily as needed for heartburn or indigestion., Disp: , Rfl:  .  GLUCOSAMINE-CHONDROIT-CALCIUM PO, Take 1,000 mg by mouth daily., Disp: , Rfl:  .  hydrOXYzine (ATARAX/VISTARIL) 25 MG tablet, Take 1-2 tablets (25-50 mg total) by mouth at bedtime as needed (insomnia)., Disp: 60 tablet, Rfl: 0 .  lovastatin (MEVACOR) 20 MG tablet, TAKE 1 TABLET BY MOUTH EVERYDAY AT BEDTIME, Disp: 90 tablet, Rfl: 3 .  metoprolol tartrate (LOPRESSOR) 25 MG tablet, Take 0.5 tablets (12.5 mg total) by mouth 2 (two) times daily as needed (palpitations)., Disp: 30 tablet, Rfl: 6 .  Multiple Vitamin (MULTIVITAMIN) tablet, Take 1 tablet by mouth daily., Disp: , Rfl:  .  vitamin B-12 (CYANOCOBALAMIN) 1000 MCG tablet, Take 1,000 mcg by mouth daily., Disp: , Rfl:  .  vitamin C (ASCORBIC ACID) 500 MG tablet, Take 500 mg by mouth daily. (Patient not taking: Reported on 12/03/2019), Disp: , Rfl:   Patient  Active Problem List   Diagnosis Date Noted  . PSVT (paroxysmal supraventricular tachycardia) ([place]) 08/14/2019  . Vitamin D deficiency 06/10/2019  . Hypocalcemia 06/10/2019  . Palpitations 03/14/2019  . Atypical chest pain 03/14/2019  . Right arm weakness 03/14/2019  . Current mild episode of major depressive disorder without prior episode ([place]) 10/15/2018  . GAD (generalized anxiety disorder) 10/15/2018  . Personal history of colonic polyps   . Polyp of transverse colon   . Obesity (BMI 30.0-34.9) 05/29/2018  . GERD (gastroesophageal reflux disease) 11/27/2017  . Osteopenia 11/27/2017  . Mixed hyperlipidemia 12/09/2014  . Insomnia  12/09/2014  . Anxiety as acute reaction to exceptional stress 12/09/2014    Past Surgical History:  Procedure Laterality Date  . BREAST BIOPSY Right    core bx- neg  . COLONOSCOPY    . COLONOSCOPY WITH PROPOFOL N/A 10/08/2018   Procedure: COLONOSCOPY WITH PROPOFOL;  Surgeon: Lucilla Lame, MD;  Location[facility] ENDOSCOPY;  Service: Endoscopy;  Laterality: N/A;  . MOHS SURGERY    . TONSILLECTOMY    . VAGINAL HYSTERECTOMY      Family History  Problem Relation Age of Onset  . Colon cancer Paternal Uncle   . Rectal cancer Paternal Uncle   . Cancer Mother        lung   . Diabetes Father   . Parkinson's disease Father   . Alzheimer's disease Father   . Heart attack Father 63  . Alcohol abuse Sister   . Heart disease Sister   . Lung cancer Sister   . Cancer Son        livcolon, liver stage 4  . Liver cancer Son   . Stroke Paternal Grandmother   . Cancer Paternal Grandfather        lung  . Down syndrome Sister   . Esophageal cancer Neg Hx   . Stomach cancer Neg Hx   . Breast cancer Neg Hx     Social History   Tobacco Use  . Smoking status: Former Smoker    Packs/day: 1.50    Years: 20.00    Pack years: 30.00    Types: Cigarettes    Quit date: 1993    Years since quitting: 28.8  . Smokeless tobacco: Never Used  Vaping Use  . Vaping Use: Never used  Substance Use Topics  . Alcohol use: Not Currently  . Drug use: Never     Allergies  Allergen Reactions  . Tetanus Toxoids Shortness Of Breath  . Bromsulphalein   . Sulfa Antibiotics     Severe vomiting    Health Maintenance  Topic Date Due  . Hepatitis C Screening  05/28/2020 (Originally 1945-05-21)  . MAMMOGRAM  06/01/2020  . COLONOSCOPY  10/07/2021  . INFLUENZA VACCINE  Completed  . DEXA SCAN  Completed  . COVID-19 Vaccine  Completed  . PNA vac Low Risk Adult  Completed  . TETANUS/TDAP  Discontinued    Chart Review Today: I personally reviewed active problem list, medication list, allergies, family  history, social history, health maintenance, notes from last encounter, lab results, imaging with the patient/caregiver today.   Review of Systems  Constitutional: Negative.  Negative for activity change, appetite change, chills, diaphoresis, fatigue and fever.  HENT: Negative.   Eyes: Negative.   Respiratory: Negative.  Negative for shortness of breath.   Cardiovascular: Negative.  Negative for chest pain.  Gastrointestinal: Negative.  Negative for abdominal pain, constipation, diarrhea, nausea and vomiting.  Endocrine: Negative.   Genitourinary: Negative.  Musculoskeletal: Negative.   Skin: Negative.   Allergic/Immunologic: Negative.   Neurological: Negative.  Negative for dizziness, syncope and weakness.  Hematological: Negative.   Psychiatric/Behavioral: Negative.   All other systems reviewed and are negative.   10 Systems reviewed and are negative for acute change except as noted in the HPI.  Objective:   Vitals:   12/03/19 0944  BP: 128/78  Pulse: 78  Resp: 16  Temp: 98 F (36.7 C)  TempSrc: Oral  SpO2: 99%  Weight: 180 lb 9.6 oz (81.9 kg)  Height: 5\' 4"  (1.626 m)    Body mass index is 31 kg/m.  Physical Exam Vitals and nursing note reviewed.  Constitutional:      General: She is not in acute distress.    Appearance: Normal appearance. She is obese. She is not ill-appearing, toxic-appearing or diaphoretic.  HENT:     Head: Normocephalic and atraumatic.  Eyes:     General:        Right eye: No discharge.        Left eye: No discharge.     Conjunctiva/sclera: Conjunctivae normal.  Cardiovascular:     Rate and Rhythm: Normal rate and regular rhythm.     Pulses: Normal pulses.     Heart sounds: Normal heart sounds.  Pulmonary:     Effort: Pulmonary effort is normal.     Breath sounds: Normal breath sounds.  Abdominal:     General: Bowel sounds are normal.     Palpations: Abdomen is soft.  Musculoskeletal:     Right lower leg: No edema.     Left lower  leg: No edema.  Skin:    General: Skin is warm and dry.     Coloration: Skin is not jaundiced or pale.  Neurological:     Mental Status: She is alert. Mental status is at baseline.     Gait: Gait normal.  Psychiatric:        Mood and Affect: Mood normal.        Behavior: Behavior normal.     Comments: Appears tired         Assessment & Plan:   1. Urinary incontinence, unspecified type Dip showed trace leuks, neg nitrites, no blood - sent for culture Keep track of your urinary symptoms We will call you with results and if they are negative I suggest working on suggestions below, keeping journal, and following up with a specialist for further evaluation. - Urine Culture - POCT urinalysis dipstick  2. Nocturia R/o infection, r/o hyperglycemia/DM Decrease/cut off fluids earlier in evening Pt to see handout and avoid possible triggers - POCT glycosylated hemoglobin (Hb A1C) - Urine Culture - POCT urinalysis dipstick  3. Gastroesophageal reflux disease without esophagitis Tx with pepcid prn, if worsening sx can do 2 week tx with ppi otc No current red flags - again increased stress may worsen sx Can improve with diet changes, avoiding triggers, managing stress  4. Recurrent UTI Recurrent UTI - discussed management options with pt and she does prefer to wait and see if urine culture is positive and then tx with abx - Urine Culture - POCT urinalysis dipstick  5. Mixed hyperlipidemia Good med compliance to lovastatin, no SE or concerns Reviewed labs done 11/17/2019 prior to OV Trigs a little high - gave diet recommendations HDL mildly low and LDL at goal, LFTs normal  6. PSVT (paroxysmal supraventricular tachycardia) ([place]) Per cardiology - metoprolol 12.5 mg BID  7. Current mild episode of major depressive  disorder without prior episode [facility]) Situational with caring for her husband - pt agreed to [facility] referral for additional resources for caregivers - Referral to  Chronic Care Management Services  8. Impaired fasting glucose With urinary sx POC A1C - POCT glycosylated hemoglobin (Hb A1C)  9. Situational stress - Referral to Chronic Care Management Services  10. Osteopenia of neck of femur, unspecified laterality On daily supplements - encouraged weight bearing activity when able to  11. Vitamin D deficiency Recent labs show vit D level was normal and improved - continue supplement  12. Hypocalcemia Repeated labs showed improved and normal calcium level - continue same vit D and calcium supplementation/doses  13. Stage 3a chronic kidney disease (HCC) GFR improved from 08/2018 labs GFR 11/2019 and 05/2019 75 and 62 - improved - update problem list - continue same management of controlling BP, pushing fluids, avoiding nephrotoxic meds   Return in about 6 months (around 06/02/2020) for Routine follow-up.   [name], PA-C 12/03/19 9:55 AM

## 2019-12-03 NOTE — Chronic Care Management (AMB) (Signed)
  Chronic Care Management   Note  12/03/2019 Name: [name] MRN: [id number] DOB: 03-15-1945  [name] is a 74 y.o. year old female who is a primary care patient of [name], [place]. I reached out to [name] by phone today in response to a referral sent by [name]'s PCP, [name], PA-C.     [name] was given information about Chronic Care Management services today including:  1. CCM service includes personalized support from designated clinical staff supervised by her physician, including individualized plan of care and coordination with other care providers 2. 24/7 contact phone numbers for assistance for urgent and routine care needs. 3. Service will only be billed when office clinical staff spend 20 minutes or more in a month to coordinate care. 4. Only one practitioner may furnish and bill the service in a calendar month. 5. The patient may stop CCM services at any time (effective at the end of the month) by phone call to the office staff. 6. The patient will be responsible for cost sharing (co-pay) of up to 20% of the service fee (after annual deductible is met).  Patient agreed to services and verbal consent obtained.   Follow up plan: Telephone appointment with care management team member scheduled for: 12/10/2019  [place] [facility]  Direct Dial: [phone]

## 2019-12-04 LAB — URINE CULTURE
MICRO NUMBER:: 11098547
SPECIMEN QUALITY:: ADEQUATE

## 2019-12-10 ENCOUNTER — Encounter: Payer: Self-pay | Admitting: *Deleted

## 2019-12-10 ENCOUNTER — Telehealth: Payer: Self-pay | Admitting: *Deleted

## 2019-12-10 ENCOUNTER — Telehealth: Payer: PPO

## 2019-12-10 ENCOUNTER — Ambulatory Visit (INDEPENDENT_AMBULATORY_CARE_PROVIDER_SITE_OTHER): Payer: PPO | Admitting: *Deleted

## 2019-12-10 DIAGNOSIS — F411 Generalized anxiety disorder: Secondary | ICD-10-CM

## 2019-12-10 DIAGNOSIS — F320 Major depressive disorder, single episode, mild: Secondary | ICD-10-CM

## 2019-12-10 DIAGNOSIS — Z6379 Other stressful life events affecting family and household: Secondary | ICD-10-CM

## 2019-12-10 NOTE — Telephone Encounter (Signed)
This encounter was created in error - please disregard.

## 2019-12-10 NOTE — Telephone Encounter (Signed)
   Chronic Care Management   Unsuccessful Call Note 12/10/2019 Name: [name] MRN: [id number] DOB: Mar 21, 1945  Patient  is a 74 year old female who sees [name], [place] for primary care. [name], PA-C asked the CCM team to consult the patient for Mental Health Counseling and Resources.     This social worker was unable to reach patient via telephone today for initial assessment as she was driving at the time of call.  Patient requested to return my call when she is able(unsuccessful outreach #1).   Plan: Will follow-up within 7 business days via telephone.      Elliot Gurney, [place] Worker  [facility] [phone]

## 2019-12-11 NOTE — Chronic Care Management (AMB) (Signed)
Chronic Care Management    Clinical Social Work Follow Up Note  12/11/2019 Name: [name] MRN: [id number] DOB: 07-27-1945  [name] is a 74 y.o. year old female who is a primary care patient of [name], [place]. The CCM team was consulted for assistance with [employer] .   Review of patient status, including review of consultants reports, other relevant assessments, and collaboration with appropriate care team members and the patient's provider was performed as part of comprehensive patient evaluation and provision of chronic care management services.    SDOH (Social Determinants of Health) assessments performed: No    Outpatient Encounter Medications as of 12/10/2019  Medication Sig  . aspirin EC 81 MG tablet Take 1 tablet (81 mg total) by mouth daily. (Patient not taking: Reported on 12/03/2019)  . calcium carbonate (OS-CAL - DOSED IN MG OF ELEMENTAL CALCIUM) 1250 (500 Ca) MG tablet Take 1 tablet by mouth daily with breakfast.  . escitalopram (LEXAPRO) 10 MG tablet Take 1 tablet (10 mg total) by mouth daily.  . famotidine (PEPCID) 10 MG tablet Take 1 tablet (10 mg total) by mouth 2 (two) times daily as needed for heartburn or indigestion.  Marland Kitchen GLUCOSAMINE-CHONDROIT-CALCIUM PO Take 1,000 mg by mouth daily.  . hydrOXYzine (ATARAX/VISTARIL) 25 MG tablet Take 1-2 tablets (25-50 mg total) by mouth at bedtime as needed (insomnia).  . lovastatin (MEVACOR) 20 MG tablet TAKE 1 TABLET BY MOUTH EVERYDAY AT BEDTIME  . Multiple Vitamin (MULTIVITAMIN) tablet Take 1 tablet by mouth daily.  . vitamin B-12 (CYANOCOBALAMIN) 1000 MCG tablet Take 1,000 mcg by mouth daily.  . vitamin C (ASCORBIC ACID) 500 MG tablet Take 500 mg by mouth daily. (Patient not taking: Reported on 12/03/2019)   No facility-administered encounter medications on file as of 12/10/2019.     Goals Addressed              This Visit's Progress   .  "I just need some information on how how I can best deal  with my husband's parkinson's" (pt-stated)        [place] (see longitudinal plan of care for additional care plan information)  Current Barriers:  . Lacks knowledge of community resource: related to managing her spouses medical issues  Clinical Social Work Clinical Goal(s):  Marland Kitchen Over the next 90 days, patient will work with SW to obtain community resources and information to assist her in the care for her spouse with Parkinsons disease  Interventions: . Inter-disciplinary care team collaboration (see longitudinal plan of care) . Patient interviewed and appropriate assessments performed . Patient discussed spouse's diagnosis and caregiving responsibilities . Patient would like information on spouses' condition and how to best manage it as a caregiver . Patient able to verbalize appropriate self care strategies-positive reinforcement provided to continue self care strategies discussed . This Education officer, museum agreed to provide patient with resources that will help with the care for her spouse . Discussed plans with patient for ongoing care management follow up and provided patient with direct contact information for care management team  Patient Self Care Activities:  . Performs ADL's independently . Performs IADL's independently . Calls provider office for new concerns or questions . Knowledge deficit of care giving support and strategy resources  Initial goal documentation         Follow Up Plan: SW will follow up with patient by phone over the next 7-17 business days   [place], [place]  [facility] [phone]

## 2019-12-11 NOTE — Patient Instructions (Signed)
Thank you allowing the Chronic Care Management Team to be a part of your care! It was a pleasure speaking with you today!  1. Please call this social worker with any questions or concerns regarding your community resource needs  CCM (Chronic Care Management) Team   Neldon Labella RN, BSN Nurse Care Coordinator  [phone]  [place], LCSW Clinical Social Worker [phone]  Goals Addressed              This Visit's Progress     "I just need some information on how how I can best deal with my husband's parkinson's" (pt-stated)        [place] (see longitudinal plan of care for additional care plan information)  Current Barriers:   Lacks knowledge of community resource: related to managing her spouses medical issues  Clinical Social Work Clinical Goal(s):   Over the next 90 days, patient will work with SW to obtain community resources and information to assist her in the care for her spouse with Parkinsons disease  Interventions:  Inter-disciplinary care team collaboration (see longitudinal plan of care)  Patient interviewed and appropriate assessments performed  Patient discussed spouse's diagnosis and caregiving responsibilities  Patient would like information on spouses' condition and how to best manage it as a caregiver  Patient able to verbalize appropriate self care strategies-positive reinforcement provided to continue self care strategies discussed  This social worker agreed to provide patient with resources that will help with the care for her spouse  Discussed plans with patient for ongoing care management follow up and provided patient with direct contact information for care management team  Patient Self Care Activities:   Performs ADL's independently  Performs IADL's independently  Calls provider office for new concerns or questions  Knowledge deficit of care giving support and strategy resources  Initial goal documentation           The patient verbalized understanding of instructions provided today and declined a print copy of patient instruction materials.   Telephone follow up appointment with care management team member scheduled for:  12/18/19

## 2019-12-15 ENCOUNTER — Other Ambulatory Visit: Payer: Self-pay

## 2019-12-15 ENCOUNTER — Ambulatory Visit: Payer: PPO | Admitting: *Deleted

## 2019-12-15 DIAGNOSIS — Z6379 Other stressful life events affecting family and household: Secondary | ICD-10-CM

## 2019-12-15 DIAGNOSIS — F411 Generalized anxiety disorder: Secondary | ICD-10-CM

## 2019-12-15 DIAGNOSIS — F439 Reaction to severe stress, unspecified: Secondary | ICD-10-CM

## 2019-12-15 NOTE — Chronic Care Management (AMB) (Signed)
Care Management    Clinical Social Work Follow Up Note  12/15/2019 Name: [name] MRN: [id number] DOB: Apr 11, 1945  [name] is a 74 y.o. year old female who is a primary care patient of [name], [place]. The CCM team was consulted for assistance with [employer] .   Review of patient status, including review of consultants reports, other relevant assessments, and collaboration with appropriate care team members and the patient's provider was performed as part of comprehensive patient evaluation and provision of chronic care management services.    SDOH (Social Determinants of Health) assessments performed: No    Outpatient Encounter Medications as of 12/15/2019  Medication Sig Note  . aspirin EC 81 MG tablet Take 1 tablet (81 mg total) by mouth daily. (Patient not taking: Reported on 12/03/2019)   . calcium carbonate (OS-CAL - DOSED IN MG OF ELEMENTAL CALCIUM) 1250 (500 Ca) MG tablet Take 1 tablet by mouth daily with breakfast.   . escitalopram (LEXAPRO) 10 MG tablet Take 1 tablet (10 mg total) by mouth daily.   . famotidine (PEPCID) 10 MG tablet Take 1 tablet (10 mg total) by mouth 2 (two) times daily as needed for heartburn or indigestion.   Marland Kitchen GLUCOSAMINE-CHONDROIT-CALCIUM PO Take 1,000 mg by mouth daily.   . hydrOXYzine (ATARAX/VISTARIL) 25 MG tablet Take 1-2 tablets (25-50 mg total) by mouth at bedtime as needed (insomnia).   . lovastatin (MEVACOR) 20 MG tablet TAKE 1 TABLET BY MOUTH EVERYDAY AT BEDTIME   . metoprolol tartrate (LOPRESSOR) 25 MG tablet Take 0.5 tablets (12.5 mg total) by mouth 2 (two) times daily as needed (palpitations). 12/03/2019: Patient states she has not needed to take this medication as of yet   . Multiple Vitamin (MULTIVITAMIN) tablet Take 1 tablet by mouth daily.   . vitamin B-12 (CYANOCOBALAMIN) 1000 MCG tablet Take 1,000 mcg by mouth daily.   . vitamin C (ASCORBIC ACID) 500 MG tablet Take 500 mg by mouth daily. (Patient not taking:  Reported on 12/03/2019)    No facility-administered encounter medications on file as of 12/15/2019.     Goals Addressed              This Visit's Progress   .  "I just need some information on how how I can best deal with my husband's parkinson's" (pt-stated)        [place] (see longitudinal plan of care for additional care plan information)  Current Barriers:  . Lacks knowledge of community resource: related to managing her spouses medical issues  Clinical Social Work Clinical Goal(s):  Marland Kitchen Over the next 90 days, patient will work with SW to obtain community resources and information to assist her in the care for her spouse with Parkinsons disease  Intervention . Information on spouses' condition, how to best manage it as a caregiver as well as caregiver support mailed to patient's home  . This Education officer, museum will follow up with patient to discuss resources provided and to follow up on any additional questions or resource needs.  Patient Self Care Activities:  . Performs ADL's independently . Performs IADL's independently . Calls provider office for new concerns or questions . Knowledge deficit of care giving support and strategy resources  Please see past updates related to this goal by clicking on the "Past Updates" button in the selected goal          Follow Up Plan: SW will follow up with patient by phone over the next 7-14  business days  [employer], [place] Administrator[facility] [phone]

## 2019-12-16 ENCOUNTER — Ambulatory Visit: Payer: PPO

## 2019-12-17 ENCOUNTER — Telehealth: Payer: PPO

## 2019-12-17 ENCOUNTER — Ambulatory Visit: Payer: Self-pay | Admitting: *Deleted

## 2019-12-17 NOTE — Chronic Care Management (AMB) (Signed)
°  Chronic Care Management   Social Work Note  12/17/2019 Name: [name] MRN: [id number] DOB: 07-Jan-1946  [name] is a 74 y.o. year old female who sees [name], [place] for primary care. The CCM team was consulted for assistance with [employer] .   Caregiver  resources mailed to patient's home.  SDOH (Social Determinants of Health) assessments performed: No     Outpatient Encounter Medications as of 12/17/2019  Medication Sig Note   aspirin EC 81 MG tablet Take 1 tablet (81 mg total) by mouth daily. (Patient not taking: Reported on 12/03/2019)    calcium carbonate (OS-CAL - DOSED IN MG OF ELEMENTAL CALCIUM) 1250 (500 Ca) MG tablet Take 1 tablet by mouth daily with breakfast.    escitalopram (LEXAPRO) 10 MG tablet Take 1 tablet (10 mg total) by mouth daily.    famotidine (PEPCID) 10 MG tablet Take 1 tablet (10 mg total) by mouth 2 (two) times daily as needed for heartburn or indigestion.    GLUCOSAMINE-CHONDROIT-CALCIUM PO Take 1,000 mg by mouth daily.    hydrOXYzine (ATARAX/VISTARIL) 25 MG tablet Take 1-2 tablets (25-50 mg total) by mouth at bedtime as needed (insomnia).    lovastatin (MEVACOR) 20 MG tablet TAKE 1 TABLET BY MOUTH EVERYDAY AT BEDTIME    metoprolol tartrate (LOPRESSOR) 25 MG tablet Take 0.5 tablets (12.5 mg total) by mouth 2 (two) times daily as needed (palpitations). 12/03/2019: Patient states she has not needed to take this medication as of yet    Multiple Vitamin (MULTIVITAMIN) tablet Take 1 tablet by mouth daily.    vitamin B-12 (CYANOCOBALAMIN) 1000 MCG tablet Take 1,000 mcg by mouth daily.    vitamin C (ASCORBIC ACID) 500 MG tablet Take 500 mg by mouth daily. (Patient not taking: Reported on 12/03/2019)    No facility-administered encounter medications on file as of 12/17/2019.    Goals Addressed   None     Follow Up Plan: SW will follow up with patient by phone over the next 7-10 business days  [place], [place] Worker  [facility] [phone] \

## 2019-12-31 ENCOUNTER — Ambulatory Visit: Payer: PPO | Admitting: *Deleted

## 2019-12-31 DIAGNOSIS — Z6379 Other stressful life events affecting family and household: Secondary | ICD-10-CM

## 2019-12-31 DIAGNOSIS — F320 Major depressive disorder, single episode, mild: Secondary | ICD-10-CM

## 2019-12-31 DIAGNOSIS — F439 Reaction to severe stress, unspecified: Secondary | ICD-10-CM

## 2020-01-01 NOTE — Patient Instructions (Signed)
Thank you allowing the Chronic Care Management Team to be a part of your care! It was a pleasure speaking with you today!  1. Please feel free to call this social worker with any questions or concerns regarding your community resource needs  CCM (Chronic Care Management) Team   Neldon Labella RN, BSN Nurse Care Coordinator  [phone]  [place], LCSW Clinical Social Worker [phone]  Goals Addressed              This Visit's Progress   .  "I just need some information on how how I can best deal with my husband's parkinson's" (pt-stated)        [place] (see longitudinal plan of care for additional care plan information)  Current Barriers:  . Lacks knowledge of community resource: related to managing her spouses medical issues  Clinical Social Work Clinical Goal(s):  Marland Kitchen Over the next 90 days, patient will work with SW to obtain community resources and information to assist her in the care for her spouse with Parkinsons disease-completed  Intervention . Information on spouses' condition, how to best manage it as a caregiver as well as caregiver support mailed to patient's home  . Patient confirmed that she received the resources mailed having no additional questions or concerns at this time . Patient verbalized appreciation for resources received  Patient Self Care Activities:  . Performs ADL's independently . Performs IADL's independently . Calls provider office for new concerns or questions . Knowledge deficit of care giving support and strategy resources  Please see past updates related to this goal by clicking on the "Past Updates" button in the selected goal          The patient verbalized understanding of instructions, educational materials, and care plan provided today and agreed to receive a mailed copy of patient instructions, educational materials, and care plan.   No further follow up required: Patient to call with any additional  questions or concerns

## 2020-01-01 NOTE — Chronic Care Management (AMB) (Signed)
  Chronic Care Management    Clinical Social Work Follow Up Note  01/01/2020 Name: [name] MRN: [id number] DOB: [birth year]  [name] is a 74 y.o. year old female who is a primary care patient of [name], [place]. The CCM team was consulted for assistance with [employer] .   Review of patient status, including review of consultants reports, other relevant assessments, and collaboration with appropriate care team members and the patient's provider was performed as part of comprehensive patient evaluation and provision of chronic care management services.    SDOH (Social Determinants of Health) assessments performed: No    Outpatient Encounter Medications as of 12/31/2019  Medication Sig  . aspirin EC 81 MG tablet Take 1 tablet (81 mg total) by mouth daily. (Patient not taking: Reported on 12/03/2019)  . calcium carbonate (OS-CAL - DOSED IN MG OF ELEMENTAL CALCIUM) 1250 (500 Ca) MG tablet Take 1 tablet by mouth daily with breakfast.  . escitalopram (LEXAPRO) 10 MG tablet Take 1 tablet (10 mg total) by mouth daily.  . famotidine (PEPCID) 10 MG tablet Take 1 tablet (10 mg total) by mouth 2 (two) times daily as needed for heartburn or indigestion.  Marland Kitchen GLUCOSAMINE-CHONDROIT-CALCIUM PO Take 1,000 mg by mouth daily.  . hydrOXYzine (ATARAX/VISTARIL) 25 MG tablet Take 1-2 tablets (25-50 mg total) by mouth at bedtime as needed (insomnia).  . lovastatin (MEVACOR) 20 MG tablet TAKE 1 TABLET BY MOUTH EVERYDAY AT BEDTIME  . Multiple Vitamin (MULTIVITAMIN) tablet Take 1 tablet by mouth daily.  . vitamin B-12 (CYANOCOBALAMIN) 1000 MCG tablet Take 1,000 mcg by mouth daily.  . vitamin C (ASCORBIC ACID) 500 MG tablet Take 500 mg by mouth daily. (Patient not taking: Reported on 12/03/2019)   No facility-administered encounter medications on file as of 12/31/2019.     Goals Addressed              This Visit's Progress   .  "I just need some information on how how I can best deal  with my husband's parkinson's" (pt-stated)        [place] (see longitudinal plan of care for additional care plan information)  Current Barriers:  . Lacks knowledge of community resource: related to managing her spouses medical issues  Clinical Social Work Clinical Goal(s):  Marland Kitchen Over the next 90 days, patient will work with SW to obtain community resources and information to assist her in the care for her spouse with Parkinsons disease-completed  Intervention . Information on spouses' condition, how to best manage it as a caregiver as well as caregiver support mailed to patient's home  . Patient confirmed that she received the resources mailed having no additional questions or concerns at this time . Patient verbalized appreciation for resources received  Patient Self Care Activities:  . Performs ADL's independently . Performs IADL's independently . Calls provider office for new concerns or questions . Knowledge deficit of care giving support and strategy resources  Please see past updates related to this goal by clicking on the "Past Updates" button in the selected goal          Follow Up Plan: Client will call this social worker with any questions or concerns regarding her community resource needs   Elliot Gurney, [place] Worker  [facility] [phone]

## 2020-03-02 ENCOUNTER — Ambulatory Visit (INDEPENDENT_AMBULATORY_CARE_PROVIDER_SITE_OTHER): Payer: PPO

## 2020-03-02 DIAGNOSIS — Z0000 Encounter for general adult medical examination without abnormal findings: Secondary | ICD-10-CM | POA: Diagnosis not present

## 2020-03-02 NOTE — Patient Instructions (Signed)
[name] , Thank you for taking time to come for your Medicare Wellness Visit. I appreciate your ongoing commitment to your health goals. Please review the following plan we discussed and let me know if I can assist you in the future.   Screening recommendations/referrals: Colonoscopy: done 10/08/18. Repeat in 2023.  Mammogram: done 06/02/19. Please call [phone] to schedule your mammogram and bone density screening.  Bone Density: done 08/14/18 Recommended yearly ophthalmology/optometry visit for glaucoma screening and checkup Recommended yearly dental visit for hygiene and checkup  Vaccinations: Influenza vaccine: done 11/18/19 Pneumococcal vaccine: done 09/21/17 Tdap vaccine: n/a due to allergy  Shingles vaccine: Shingrix discussed. Please contact your pharmacy for coverage information.  Covid-19:done 03/28/19, 04/18/19 & 11/24/19  Conditions/risks identified: Keep up the great work!   Next appointment: Follow up in one year for your annual wellness visit    Preventive Care 65 Years and Older, Female Preventive care refers to lifestyle choices and visits with your health care provider that can promote health and wellness. What does preventive care include?  A yearly physical exam. This is also called an annual well check.  Dental exams once or twice a year.  Routine eye exams. Ask your health care provider how often you should have your eyes checked.  Personal lifestyle choices, including:  Daily care of your teeth and gums.  Regular physical activity.  Eating a healthy diet.  Avoiding tobacco and drug use.  Limiting alcohol use.  Practicing safe sex.  Taking low-dose aspirin every day.  Taking vitamin and mineral supplements as recommended by your health care provider. What happens during an annual well check? The services and screenings done by your health care provider during your annual well check will depend on your age, overall health, lifestyle risk factors,  and family history of disease. Counseling  Your health care provider may ask you questions about your:  Alcohol use.  Tobacco use.  Drug use.  Emotional well-being.  Home and relationship well-being.  Sexual activity.  Eating habits.  History of falls.  Memory and ability to understand (cognition).  Work and work Statistician.  Reproductive health. Screening  You may have the following tests or measurements:  Height, weight, and BMI.  Blood pressure.  Lipid and cholesterol levels. These may be checked every 5 years, or more frequently if you are over 75 years old.  Skin check.  Lung cancer screening. You may have this screening every year starting at age 75 if you have a 30-pack-year history of smoking and currently smoke or have quit within the past 15 years.  Fecal occult blood test (FOBT) of the stool. You may have this test every year starting at age 75  Flexible sigmoidoscopy or colonoscopy. You may have a sigmoidoscopy every 5 years or a colonoscopy every 10 years starting at age 75  Hepatitis C blood test.  Hepatitis B blood test.  Sexually transmitted disease (STD) testing.  Diabetes screening. This is done by checking your blood sugar (glucose) after you have not eaten for a while (fasting). You may have this done every 1-3 years.  Bone density scan. This is done to screen for osteoporosis. You may have this done starting at age 75  Mammogram. This may be done every 1-2 years. Talk to your health care provider about how often you should have regular mammograms. Talk with your health care provider about your test results, treatment options, and if necessary, the need for more tests. Vaccines  Your health care provider may  recommend certain vaccines, such as:  Influenza vaccine. This is recommended every year.  Tetanus, diphtheria, and acellular pertussis (Tdap, Td) vaccine. You may need a Td booster every 10 years.  Zoster vaccine. You may need  this after age 75  Pneumococcal 13-valent conjugate (PCV13) vaccine. One dose is recommended after age 75  Pneumococcal polysaccharide (PPSV23) vaccine. One dose is recommended after age 75 Talk to your health care provider about which screenings and vaccines you need and how often you need them. This information is not intended to replace advice given to you by your health care provider. Make sure you discuss any questions you have with your health care provider. Document Released: 02/26/2015 Document Revised: 10/20/2015 Document Reviewed: 12/01/2014 Elsevier Interactive Patient Education  2017 [place] Prevention in the Home Falls can cause injuries. They can happen to people of all ages. There are many things you can do to make your home safe and to help prevent falls. What can I do on the outside of my home?  Regularly fix the edges of walkways and driveways and fix any cracks.  Remove anything that might make you trip as you walk through a door, such as a raised step or threshold.  Trim any bushes or trees on the path to your home.  Use bright outdoor lighting.  Clear any walking paths of anything that might make someone trip, such as rocks or tools.  Regularly check to see if handrails are loose or broken. Make sure that both sides of any steps have handrails.  Any raised decks and porches should have guardrails on the edges.  Have any leaves, snow, or ice cleared regularly.  Use sand or salt on walking paths during winter.  Clean up any spills in your garage right away. This includes oil or grease spills. What can I do in the bathroom?  Use night lights.  Install grab bars by the toilet and in the tub and shower. Do not use towel bars as grab bars.  Use non-skid mats or decals in the tub or shower.  If you need to sit down in the shower, use a plastic, non-slip stool.  Keep the floor dry. Clean up any water that spills on the floor as soon as it  happens.  Remove soap buildup in the tub or shower regularly.  Attach bath mats securely with double-sided non-slip rug tape.  Do not have throw rugs and other things on the floor that can make you trip. What can I do in the bedroom?  Use night lights.  Make sure that you have a light by your bed that is easy to reach.  Do not use any sheets or blankets that are too big for your bed. They should not hang down onto the floor.  Have a firm chair that has side arms. You can use this for support while you get dressed.  Do not have throw rugs and other things on the floor that can make you trip. What can I do in the kitchen?  Clean up any spills right away.  Avoid walking on wet floors.  Keep items that you use a lot in easy-to-reach places.  If you need to reach something above you, use a strong step stool that has a grab bar.  Keep electrical cords out of the way.  Do not use floor polish or wax that makes floors slippery. If you must use wax, use non-skid floor wax.  Do not have throw rugs and  other things on the floor that can make you trip. What can I do with my stairs?  Do not leave any items on the stairs.  Make sure that there are handrails on both sides of the stairs and use them. Fix handrails that are broken or loose. Make sure that handrails are as long as the stairways.  Check any carpeting to make sure that it is firmly attached to the stairs. Fix any carpet that is loose or worn.  Avoid having throw rugs at the top or bottom of the stairs. If you do have throw rugs, attach them to the floor with carpet tape.  Make sure that you have a light switch at the top of the stairs and the bottom of the stairs. If you do not have them, ask someone to add them for you. What else can I do to help prevent falls?  Wear shoes that:  Do not have high heels.  Have rubber bottoms.  Are comfortable and fit you well.  Are closed at the toe. Do not wear sandals.  If you  use a stepladder:  Make sure that it is fully opened. Do not climb a closed stepladder.  Make sure that both sides of the stepladder are locked into place.  Ask someone to hold it for you, if possible.  Clearly mark and make sure that you can see:  Any grab bars or handrails.  First and last steps.  Where the edge of each step is.  Use tools that help you move around (mobility aids) if they are needed. These include:  Canes.  Walkers.  Scooters.  Crutches.  Turn on the lights when you go into a dark area. Replace any light bulbs as soon as they burn out.  Set up your furniture so you have a clear path. Avoid moving your furniture around.  If any of your floors are uneven, fix them.  If there are any pets around you, be aware of where they are.  Review your medicines with your doctor. Some medicines can make you feel dizzy. This can increase your chance of falling. Ask your doctor what other things that you can do to help prevent falls. This information is not intended to replace advice given to you by your health care provider. Make sure you discuss any questions you have with your health care provider. Document Released: 11/26/2008 Document Revised: 07/08/2015 Document Reviewed: 03/06/2014 Elsevier Interactive Patient Education  2017 [employer].

## 2020-03-02 NOTE — Progress Notes (Signed)
Subjective:   [name] is a 75 y.o. female who presents for Medicare Annual (Subsequent) preventive examination.  Virtual Visit via Telephone Note  I connected with  [name] on 03/02/20 at 11:20 AM EST by telephone and verified that I am speaking with the correct person using two identifiers.  Location: Patient: home Provider: [place] Persons participating in the virtual visit: [place]   I discussed the limitations, risks, security and privacy concerns of performing an evaluation and management service by telephone and the availability of in person appointments. The patient expressed understanding and agreed [name] proceed.  Interactive audio and video telecommunications were attempted between this nurse and patient, however failed, due [name] patient having technical difficulties OR patient did not have access [name] video capability.  We continued and completed visit with audio only.  Some vital signs may be absent or patient reported.   Clemetine Marker, LPN    Review of Systems     Cardiac Risk Factors include: advanced age (>1men, >63 women);dyslipidemia     Objective:    Today's Vitals   03/02/20 1116  PainSc: 5    There is no height or weight on file [name] calculate BMI.  Advanced Directives 03/02/2020 10/08/2018 09/27/2018 09/21/2017 12/04/2016 05/31/2016 02/16/2016  Does Patient Have a Medical Advance Directive? Yes Yes Yes Yes Yes No Yes  Type of Paramedic of [place];Living will - [place];Living will [place];Living will [place];Living will - Living will  Does patient want [name] make changes [name] medical advance directive? - - No - Patient declined - - - -  Copy of [place] in Chart? Yes - validated most recent copy scanned in chart (See row information) - Yes - validated most recent copy scanned in chart (See row information) No - copy requested - - -   Would patient like information on creating a medical advance directive? - - - - - - -    Current Medications (verified) Outpatient Encounter Medications as of 03/02/2020  Medication Sig  . calcium carbonate (OS-CAL - DOSED IN MG OF ELEMENTAL CALCIUM) 1250 (500 Ca) MG tablet Take 1 tablet by mouth daily with breakfast.  . escitalopram (LEXAPRO) 10 MG tablet Take 1 tablet (10 mg total) by mouth daily.  . famotidine (PEPCID) 10 MG tablet Take 1 tablet (10 mg total) by mouth 2 (two) times daily as needed for heartburn or indigestion.  Marland Kitchen GLUCOSAMINE-CHONDROIT-CALCIUM PO Take 1,000 mg by mouth daily.  . hydrOXYzine (ATARAX/VISTARIL) 25 MG tablet Take 1-2 tablets (25-50 mg total) by mouth at bedtime as needed (insomnia).  . lovastatin (MEVACOR) 20 MG tablet TAKE 1 TABLET BY MOUTH EVERYDAY AT BEDTIME  . Multiple Vitamin (MULTIVITAMIN) tablet Take 1 tablet by mouth daily.  . vitamin B-12 (CYANOCOBALAMIN) 1000 MCG tablet Take 1,000 mcg by mouth daily.  . metoprolol tartrate (LOPRESSOR) 25 MG tablet Take 0.5 tablets (12.5 mg total) by mouth 2 (two) times daily as needed (palpitations).  . [DISCONTINUED] aspirin EC 81 MG tablet Take 1 tablet (81 mg total) by mouth daily. (Patient not taking: Reported on 12/03/2019)  . [DISCONTINUED] vitamin C (ASCORBIC ACID) 500 MG tablet Take 500 mg by mouth daily. (Patient not taking: Reported on 12/03/2019)   No facility-administered encounter medications on file as of 03/02/2020.    Allergies (verified) Tetanus toxoids, Bromsulphalein, and Sulfa antibiotics   History: Past Medical History:  Diagnosis Date  . Allergy   .  Arthritis   . Asthma   . Atypical mole 06/12/2018   right prox lat thigh/mod  . Atypical mole 05/19/2014   left sup med buttock/excision  . Atypical mole 03/26/2014   right sup lat buttock  . Atypical mole 03/26/2013   left lat mid back  . Atypical mole 11/08/2006   right shoulder superior/excision  . Basal cell carcinoma 06/12/2016    right medial sup canthus below eyebrow  . Basal cell carcinoma 09/23/2014   left nasal bridge  . Basal cell carcinoma 03/26/2013   left nasal sidewall  . Basal cell carcinoma 08/27/2012   left nasal dorsum  . Cancer (HCC)    shoulder, nose skin CA- basal cell CA  . GERD (gastroesophageal reflux disease)    occasional  . Hyperlipidemia   . Osteopenia   . Stress due [name] illness of family member 05/29/2018   Past Surgical History:  Procedure Laterality Date  . BREAST BIOPSY Right    core bx- neg  . COLONOSCOPY    . COLONOSCOPY WITH PROPOFOL N/A 10/08/2018   Procedure: COLONOSCOPY WITH PROPOFOL;  Surgeon: Lucilla Lame, MD;  Location: [facility] ENDOSCOPY;  Service: Endoscopy;  Laterality: N/A;  . MOHS SURGERY    . TONSILLECTOMY    . VAGINAL HYSTERECTOMY     Family History  Problem Relation Age of Onset  . Colon cancer Paternal Uncle   . Rectal cancer Paternal Uncle   . Cancer Mother        lung   . Diabetes Father   . Parkinson's disease Father   . Alzheimer's disease Father   . Heart attack Father 75  . Alcohol abuse Sister   . Heart disease Sister   . Lung cancer Sister   . Cancer Son        livcolon, liver stage 4  . Liver cancer Son   . Stroke Paternal Grandmother   . Cancer Paternal Grandfather        lung  . Down syndrome Sister   . Esophageal cancer Neg Hx   . Stomach cancer Neg Hx   . Breast cancer Neg Hx    Social History   Socioeconomic History  . Marital status: Married    Spouse name: Jenny Reichmann  . Number of children: 2  . Years of education: Not on file  . Highest education level: Bachelor's degree (e.g., BA, AB, BS)  Occupational History  . Occupation: Retired  Tobacco Use  . Smoking status: Former Smoker    Packs/day: 1.50    Years: 20.00    Pack years: 30.00    Types: Cigarettes    Quit date: 1993    Years since quitting: 29.0  . Smokeless tobacco: Never Used  Vaping Use  . Vaping Use: Never used  Substance and Sexual Activity  . Alcohol use: Not  Currently  . Drug use: Never  . Sexual activity: Not Currently  Other Topics Concern  . Not on file  Social History Narrative  . Not on file   Social Determinants of Health   Financial Resource Strain: Low Risk   . Difficulty of Paying Living Expenses: Not hard at all  Food Insecurity: No Food Insecurity  . Worried About Charity fundraiser in the Last Year: Never true  . Ran Out of Food in the Last Year: Never true  Transportation Needs: No Transportation Needs  . Lack of Transportation (Medical): No  . Lack of Transportation (Non-Medical): No  Physical Activity: Sufficiently Active  . Days  of Exercise per Week: 5 days  . Minutes of Exercise per Session: 40 min  Stress: No Stress Concern Present  . Feeling of Stress : Not at all  Social Connections: Moderately Integrated  . Frequency of Communication with Friends and Family: More than three times a week  . Frequency of Social Gatherings with Friends and Family: Three times a week  . Attends Religious Services: More than 4 times per year  . Active Member of Clubs or Organizations: No  . Attends Archivist Meetings: Never  . Marital Status: Married    Tobacco Counseling Counseling given: Not Answered   Clinical Intake:  Pre-visit preparation completed: Yes  Pain : 0-10 Pain Score: 5  Pain Type: Chronic pain Pain Location: Back Pain Orientation: Lower Pain Descriptors / Indicators: Aching,Sore,Discomfort Pain Onset: More than a month ago Pain Frequency: Intermittent     Nutritional Risks: None Diabetes: No  How often do you need [name] have someone help you when you read instructions, pamphlets, or other written materials from your doctor or pharmacy?: 1 - Never   Interpreter Needed?: No  Information entered by :: Clemetine Marker LPN   Activities of Daily Living In your present state of health, do you have any difficulty performing the following activities: 03/02/2020 12/03/2019  Hearing? N N  Comment  declines hearing aids -  Vision? N N  Difficulty concentrating or making decisions? N N  Walking or climbing stairs? N N  Dressing or bathing? N N  Doing errands, shopping? N N  Preparing Food and eating ? N -  Using the Toilet? N -  In the past six months, have you accidently leaked urine? Y -  Comment wears pads for protection if needed -  Do you have problems with loss of bowel control? N -  Managing your Medications? N -  Managing your Finances? N -  Housekeeping or managing your Housekeeping? N -  Some recent data might be hidden    Patient Care Team: [name], PA-C as PCP - General (Family Medicine) End, Harrell Gave, MD as PCP - Cardiology (Cardiology) [place], [place], [place] as Social Worker Ralene Bathe, MD (Dermatology) Arelia Sneddon, [place] (Optometry)  Indicate any recent Medical Services you may have received from other than [facility] providers in the past year (date may be approximate).     Assessment:   This is a routine wellness examination for [place].  Hearing/Vision screen  Hearing Screening   125Hz  250Hz  500Hz  1000Hz  2000Hz  3000Hz  4000Hz  6000Hz  8000Hz   Right ear:           Left ear:           Comments: Pt denies hearing difficulty  Vision Screening Comments: Annual vision screenings done by Dr. Annamaria Helling at [place] issues and exercise activities discussed: Current Exercise Habits: Home exercise routine, Type of exercise: walking, Time (Minutes): 40, Frequency (Times/Week): 5, Weekly Exercise (Minutes/Week): 200, Intensity: Moderate, Exercise limited by: orthopedic condition(s)  Goals    .  "I just need some information on how how I can best deal with my husband's parkinson's" (pt-stated)      [place] (see longitudinal plan of care for additional care plan information)  Current Barriers:  . Lacks knowledge of community resource: related [name] managing her spouses medical issues  Clinical Social Work Clinical Goal(s):   Marland Kitchen Over the next 90 days, patient will work with SW [name] obtain community resources and information [name] assist her in the  care for her spouse with Parkinsons disease-completed  Intervention . Information on spouses' condition, how [name] best manage it as a caregiver as well as caregiver support mailed [name] patient's home  . Patient confirmed that she received the resources mailed having no additional questions or concerns at this time . Patient verbalized appreciation for resources received  Patient Self Care Activities:  . Performs ADL's independently . Performs IADL's independently . Calls provider office for new concerns or questions . Knowledge deficit of care giving support and strategy resources  Please see past updates related [name] this goal by clicking on the "Past Updates" button in the selected goal      .  DIET - INCREASE WATER INTAKE      Recommend [name] drink at least 6-8 8oz glasses of water per day.      Depression Screen PHQ 2/9 Scores 03/02/2020 12/03/2019 12/03/2019 05/29/2019 11/28/2018 10/15/2018 09/27/2018  PHQ - 2 Score 2 2 2 2 3 3 2   PHQ- 9 Score 7 8 - 9 14 9 9     Fall Risk Fall Risk  03/02/2020 12/03/2019 05/29/2019 11/28/2018 10/15/2018  Falls in the past year? 1 1 0 1 0  Comment - - - - -  Number falls in past yr: 1 1 0 1 0  Injury with Fall? 0 0 0 0 0  Risk for fall due [name] : History of fall(s) History of fall(s) - - -  Risk for fall due [name]: Comment - - - - -  Follow up Falls prevention discussed - - - -    FALL RISK PREVENTION PERTAINING [name] THE HOME:  Any stairs in or around the home? Yes  If so, are there any without handrails? No  Home free of loose throw rugs in walkways, pet beds, electrical cords, etc? Yes  Adequate lighting in your home [name] reduce risk of falls? Yes   ASSISTIVE DEVICES UTILIZED [name] PREVENT FALLS:  Life alert? No  Use of a cane, walker or w/c? No  Grab bars in the bathroom? No  Shower chair or bench in shower? No  Elevated toilet seat or a  handicapped toilet? Yes   TIMED UP AND GO:  Was the test performed? No . Telephonic visit   Cognitive Function: Normal cognitive status assessed by direct observation by this Nurse Health Advisor. No abnormalities found.       6CIT Screen 09/27/2018 09/21/2017  What Year? 0 points 0 points  What month? 0 points 0 points  What time? 0 points 0 points  Count back from 20 0 points 0 points  Months in reverse 0 points 0 points  Repeat phrase 0 points 0 points  Total Score 0 0    Immunizations Immunization History  Administered Date(s) Administered  . Fluad Quad(high Dose 65+) 11/18/2019  . Influenza, High Dose Seasonal PF 11/03/2016, 10/30/2017, 10/17/2018  . Influenza-Unspecified 10/14/2013, 11/15/2015, 11/03/2016  . PFIZER(Purple Top)SARS-COV-2 Vaccination 03/28/2019, 04/18/2019, 11/24/2019  . Pneumococcal Conjugate-13 12/09/2014, 05/31/2016  . Pneumococcal Polysaccharide-23 09/21/2017   Tdap vaccine: n/a due [name] tetanus allergy   Flu Vaccine status: Up [name] date  Pneumococcal vaccine status: Up [name] date  Covid-19 vaccine status: Completed vaccines  Qualifies for Shingles Vaccine? Yes   Zostavax completed Yes   Shingrix Completed?: No.    Education has been provided regarding the importance of this vaccine. Patient has been advised [name] call insurance company [name] determine out of pocket expense if they have not yet received this vaccine. Advised may also receive  vaccine at local pharmacy or Health Dept. Verbalized acceptance and understanding.  Screening Tests Health Maintenance  Topic Date Due  . Hepatitis C Screening  05/28/2020 (Originally Jun 23, 1945)  . COVID-19 Vaccine (4 - Booster for Pfizer series) 05/24/2020  . MAMMOGRAM  06/01/2020  . COLONOSCOPY (Pts 45-54yrs Insurance coverage will need [name] be confirmed)  10/07/2021  . INFLUENZA VACCINE  Completed  . DEXA SCAN  Completed  . PNA vac Low Risk Adult  Completed  . TETANUS/TDAP  Discontinued    Health  Maintenance  There are no preventive care reminders [name] display for this patient.   Colorectal cancer screening: Type of screening: Colonoscopy. Completed 10/08/18. Repeat every 3 years  Mammogram status: Completed 06/02/19. Repeat every year   Bone Density status: Completed 08/14/18. Results reflect: Bone density results: OSTEOPENIA. Repeat every 2 years.  Lung Cancer Screening: (Low Dose CT Chest recommended if Age 64-80 years, 30 pack-year currently smoking OR have quit w/in 15years.) does not qualify.   Additional Screening:  Hepatitis C Screening: does qualify; postponed  Vision Screening: Recommended annual ophthalmology exams for early detection of glaucoma and other disorders of the eye. Is the patient up [name] date with their annual eye exam?  Yes  Who is the provider or what is the name of the office in which the patient attends annual eye exams? [place] Screening: Recommended annual dental exams for proper oral hygiene  Community Resource Referral / Chronic Care Management: CRR required this visit?  No   CCM required this visit?  No      Plan:     I have personally reviewed and noted the following in the patient's chart:   . Medical and social history . Use of alcohol, tobacco or illicit drugs  . Current medications and supplements . Functional ability and status . Nutritional status . Physical activity . Advanced directives . List of other physicians . Hospitalizations, surgeries, and ER visits in previous 12 months . Vitals . Screenings [name] include cognitive, depression, and falls . Referrals and appointments  In addition, I have reviewed and discussed with patient certain preventive protocols, quality metrics, and best practice recommendations. A written personalized care plan for preventive services as well as general preventive health recommendations were provided [name] patient.     Clemetine Marker, LPN   0/32/1224   Nurse Notes:  none

## 2020-03-07 DIAGNOSIS — Z23 Encounter for immunization: Secondary | ICD-10-CM | POA: Diagnosis not present

## 2020-04-23 ENCOUNTER — Other Ambulatory Visit: Payer: Self-pay | Admitting: Family Medicine

## 2020-04-23 DIAGNOSIS — Z1231 Encounter for screening mammogram for malignant neoplasm of breast: Secondary | ICD-10-CM

## 2020-05-14 ENCOUNTER — Other Ambulatory Visit: Payer: Self-pay | Admitting: Family Medicine

## 2020-05-14 DIAGNOSIS — F320 Major depressive disorder, single episode, mild: Secondary | ICD-10-CM

## 2020-06-02 ENCOUNTER — Other Ambulatory Visit: Payer: Self-pay

## 2020-06-02 ENCOUNTER — Ambulatory Visit
Admission: RE | Admit: 2020-06-02 | Discharge: 2020-06-02 | Disposition: A | Discharge status: Home / Self Care | Payer: PPO | Source: Ambulatory Visit | Attending: Family Medicine | Admitting: Family Medicine

## 2020-06-02 DIAGNOSIS — Z1231 Encounter for screening mammogram for malignant neoplasm of breast: Secondary | ICD-10-CM | POA: Insufficient documentation

## 2020-06-09 ENCOUNTER — Ambulatory Visit (INDEPENDENT_AMBULATORY_CARE_PROVIDER_SITE_OTHER): Payer: PPO | Admitting: Family Medicine

## 2020-06-09 ENCOUNTER — Other Ambulatory Visit: Payer: Self-pay

## 2020-06-09 ENCOUNTER — Encounter: Payer: Self-pay | Admitting: Family Medicine

## 2020-06-09 VITALS — BP 124/64 | HR 73 | Temp 98.3°F | Resp 16 | Ht 64.0 in | Wt 183.2 lb

## 2020-06-09 DIAGNOSIS — E559 Vitamin D deficiency, unspecified: Secondary | ICD-10-CM | POA: Diagnosis not present

## 2020-06-09 DIAGNOSIS — Z1159 Encounter for screening for other viral diseases: Secondary | ICD-10-CM

## 2020-06-09 DIAGNOSIS — R350 Frequency of micturition: Secondary | ICD-10-CM | POA: Diagnosis not present

## 2020-06-09 DIAGNOSIS — E782 Mixed hyperlipidemia: Secondary | ICD-10-CM | POA: Diagnosis not present

## 2020-06-09 DIAGNOSIS — E781 Pure hyperglyceridemia: Secondary | ICD-10-CM | POA: Diagnosis not present

## 2020-06-09 DIAGNOSIS — R7301 Impaired fasting glucose: Secondary | ICD-10-CM

## 2020-06-09 DIAGNOSIS — F320 Major depressive disorder, single episode, mild: Secondary | ICD-10-CM

## 2020-06-09 DIAGNOSIS — I471 Supraventricular tachycardia: Secondary | ICD-10-CM | POA: Diagnosis not present

## 2020-06-09 DIAGNOSIS — Z5181 Encounter for therapeutic drug level monitoring: Secondary | ICD-10-CM

## 2020-06-09 DIAGNOSIS — K219 Gastro-esophageal reflux disease without esophagitis: Secondary | ICD-10-CM

## 2020-06-09 DIAGNOSIS — R3915 Urgency of urination: Secondary | ICD-10-CM

## 2020-06-09 DIAGNOSIS — F411 Generalized anxiety disorder: Secondary | ICD-10-CM

## 2020-06-09 DIAGNOSIS — F439 Reaction to severe stress, unspecified: Secondary | ICD-10-CM | POA: Diagnosis not present

## 2020-06-09 DIAGNOSIS — M85859 Other specified disorders of bone density and structure, unspecified thigh: Secondary | ICD-10-CM | POA: Diagnosis not present

## 2020-06-09 MED ORDER — HYDROXYZINE HCL 25 MG PO TABS: 25.0000 mg | ORAL_TABLET | Freq: Every evening | ORAL | 3 refills | Status: DC | PRN

## 2020-06-09 MED ORDER — ESCITALOPRAM OXALATE 10 MG PO TABS: 15.0000 mg | ORAL_TABLET | Freq: Every day | ORAL | 3 refills | Status: DC

## 2020-06-09 MED ORDER — LOVASTATIN 20 MG PO TABS: ORAL_TABLET | ORAL | 3 refills | Status: DC

## 2020-06-09 NOTE — Patient Instructions (Signed)
Urinary Frequency, Adult Urinary frequency means urinating more often than usual. You may urinate every 1-2 hours even though you drink a normal amount of fluid and do not have a bladder infection or condition. Although you urinate more often than normal,the total amount of urine produced in a day is normal. With urinary frequency, you may have an urgent need to urinate often. The stress and anxiety of needing to find a bathroom quickly can make this urge worse. This condition may go away on its own or you may need treatment at home. Home treatment may include bladder training, exercises, taking medicines, ormaking changes to your diet. Follow these instructions at home: Bladder health  Keep a bladder diary if told by your health care provider. Keep track of: What you eat and drink. How often you urinate. How much you urinate. Follow a bladder training program if told by your health care provider. This may include: Learning to delay going to the bathroom. Double urinating (voiding). This helps if you are not completely emptying your bladder. Scheduled voiding. Do Kegel exercises as told by your health care provider. Kegel exercises strengthen the muscles that help control urination, which may help the condition.  Eating and drinking If told by your health care provider, make diet changes, such as: Avoiding caffeine. Drinking fewer fluids, especially alcohol. Not drinking in the evening. Avoiding foods or drinks that may irritate the bladder. These include coffee, tea, soda, artificial sweeteners, citrus, tomato-based foods, and chocolate. Eating foods that help prevent or ease constipation. Constipation can make this condition worse. Your health care provider may recommend that you: Drink enough fluid to keep your urine pale yellow. Take over-the-counter or prescription medicines. Eat foods that are high in fiber, such as beans, whole grains, and fresh fruits and vegetables. Limit foods  that are high in fat and processed sugars, such as fried or sweet foods. General instructions Take over-the-counter and prescription medicines only as told by your health care provider. Keep all follow-up visits as told by your health care provider. This is important. Contact a health care provider if: You start urinating more often. You feel pain or irritation when you urinate. You notice blood in your urine. Your urine looks cloudy. You develop a fever. You begin vomiting. Get help right away if: You are unable to urinate. Summary Urinary frequency means urinating more often than usual. With urinary frequency, you may urinate every 1-2 hours even though you drink a normal amount of fluid and do not have a bladder infection or other bladder condition. Your health care provider may recommend that you keep a bladder diary, follow a bladder training program, or make dietary changes. If told by your health care provider, do Kegel exercises to strengthen the muscles that help control urination. Take over-the-counter and prescription medicines only as told by your health care provider. Contact a health care provider if your symptoms do not improve or get worse. This information is not intended to replace advice given to you by your health care provider. Make sure you discuss any questions you have with your healthcare provider. Document Revised: 08/09/2017 Document Reviewed: 08/09/2017 Elsevier Patient Education  2021 [employer]  

## 2020-06-09 NOTE — Progress Notes (Signed)
Name: [name]   MRN: [id number]    DOB: 22-Aug-1945   Date:06/09/2020       Progress Note  Chief Complaint  Patient presents with  . Follow-up  . Depression  . Hyperlipidemia     Subjective:   [name] is a 75 y.o. female, presents to clinic for routine f/up  Depression and situational stress- on lexapro 10 mg Depression screen [facility] 2/9 06/09/2020 03/02/2020 12/03/2019  Decreased Interest 0 1 1  Down, Depressed, Hopeless 0 1 1  PHQ - 2 Score 0 2 2  Altered sleeping 0 1 2  Tired, decreased energy 0 1 1  Change in appetite 3 1 2   Feeling bad or failure about yourself  2 1 1   Trouble concentrating 1 0 0  Moving slowly or fidgety/restless 0 1 0  Suicidal thoughts 0 0 0  PHQ-9 Score 6 7 8   Difficult doing work/chores Not difficult at all Not difficult at all Somewhat difficult  Some recent data might be hidden   GAD 7 : Generalized Anxiety Score 06/09/2020 12/03/2019 11/28/2018 10/15/2018  Nervous, Anxious, on Edge 2 2 2 3   Control/stop worrying 2 2 2 2   Worry too much - different things 2 2 2 2   Trouble relaxing 2 2 2 2   Restless 1 1 1 2   Easily annoyed or irritable 1 1 1 3   Afraid - awful might happen 1 1 0 1  Total GAD 7 Score 11 11 10 15   Anxiety Difficulty Somewhat difficult Very difficult Somewhat difficult Somewhat difficult   GERD on pepcid bid prn  HTN on metoprolol 12.5 mg prn for palpitations  HLD - on lovastatin 20 mg daily Lab Results  Component Value Date   CHOL 174 11/17/2019   HDL 49 (L) 11/17/2019   LDLCALC 90 11/17/2019   TRIG 269 (H) 11/17/2019   CHOLHDL 3.6 11/17/2019   Lab Results  Component Value Date   HGBA1C 5.5 02/16/2016   BP Readings from Last 3 Encounters:  06/09/20 124/64  12/03/19 128/78  08/13/19 120/60   Family hx of early MI - father at age 66  Wt Readings from Last 5 Encounters:  06/09/20 183 lb 3.2 oz (83.1 kg)  12/03/19 180 lb 9.6 oz (81.9 kg)  08/13/19 180 lb (81.6 kg)  05/29/19 179 lb 9.6 oz (81.5 kg)   05/08/19 176 lb (79.8 kg)   BMI Readings from Last 5 Encounters:  06/09/20 31.45 kg/m  12/03/19 31.00 kg/m  08/13/19 30.90 kg/m  05/29/19 30.83 kg/m  05/08/19 30.21 kg/m      Current Outpatient Medications:  .  calcium carbonate (OS-CAL - DOSED IN MG OF ELEMENTAL CALCIUM) 1250 (500 Ca) MG tablet, Take 1 tablet by mouth daily with breakfast., Disp: , Rfl:  .  escitalopram (LEXAPRO) 10 MG tablet, TAKE 1 TABLET BY MOUTH EVERY DAY, Disp: 90 tablet, Rfl: 3 .  famotidine (PEPCID) 10 MG tablet, Take 1 tablet (10 mg total) by mouth 2 (two) times daily as needed for heartburn or indigestion., Disp: , Rfl:  .  GLUCOSAMINE-CHONDROIT-CALCIUM PO, Take 1,000 mg by mouth daily., Disp: , Rfl:  .  hydrOXYzine (ATARAX/VISTARIL) 25 MG tablet, Take 1-2 tablets (25-50 mg total) by mouth at bedtime as needed (insomnia)., Disp: 60 tablet, Rfl: 0 .  lovastatin (MEVACOR) 20 MG tablet, TAKE 1 TABLET BY MOUTH EVERYDAY AT BEDTIME, Disp: 90 tablet, Rfl: 3 .  Multiple Vitamin (MULTIVITAMIN) tablet, Take 1 tablet by mouth daily., Disp: , Rfl:  .  vitamin B-12 (CYANOCOBALAMIN) 1000 MCG tablet, Take 1,000 mcg by mouth daily., Disp: , Rfl:  .  metoprolol tartrate (LOPRESSOR) 25 MG tablet, Take 0.5 tablets (12.5 mg total) by mouth 2 (two) times daily as needed (palpitations)., Disp: 30 tablet, Rfl: 6  Patient Active Problem List   Diagnosis Date Noted  . PSVT (paroxysmal supraventricular tachycardia) ([place]) 08/14/2019  . Vitamin D deficiency 06/10/2019  . Hypocalcemia 06/10/2019  . Palpitations 03/14/2019  . Atypical chest pain 03/14/2019  . Right arm weakness 03/14/2019  . Current mild episode of major depressive disorder without prior episode ([place]) 10/15/2018  . GAD (generalized anxiety disorder) 10/15/2018  . Personal history of colonic polyps   . Polyp of transverse colon   . Obesity (BMI 30.0-34.9) 05/29/2018  . GERD (gastroesophageal reflux disease) 11/27/2017  . Osteopenia 11/27/2017  . Mixed  hyperlipidemia 12/09/2014  . Insomnia 12/09/2014  . Anxiety as acute reaction to exceptional stress 12/09/2014    Past Surgical History:  Procedure Laterality Date  . BREAST BIOPSY Right    core bx- neg  . COLONOSCOPY    . COLONOSCOPY WITH PROPOFOL N/A 10/08/2018   Procedure: COLONOSCOPY WITH PROPOFOL;  Surgeon: Lucilla Lame, MD;  Location[facility];  Service: Endoscopy;  Laterality: N/A;  . MOHS SURGERY    . TONSILLECTOMY    . VAGINAL HYSTERECTOMY      Family History  Problem Relation Age of Onset  . Colon cancer Paternal Uncle   . Rectal cancer Paternal Uncle   . Cancer Mother        lung   . Diabetes Father   . Parkinson's disease Father   . Alzheimer's disease Father   . Heart attack Father 88  . Alcohol abuse Sister   . Heart disease Sister   . Lung cancer Sister   . Cancer Son        livcolon, liver stage 4  . Liver cancer Son   . Stroke Paternal Grandmother   . Cancer Paternal Grandfather        lung  . Down syndrome Sister   . Esophageal cancer Neg Hx   . Stomach cancer Neg Hx   . Breast cancer Neg Hx     Social History   Tobacco Use  . Smoking status: Former Smoker    Packs/day: 1.50    Years: 20.00    Pack years: 30.00    Types: Cigarettes    Quit date: 1993    Years since quitting: 29.3  . Smokeless tobacco: Never Used  Vaping Use  . Vaping Use: Never used  Substance Use Topics  . Alcohol use: Not Currently  . Drug use: Never     Allergies  Allergen Reactions  . Tetanus Toxoids Shortness Of Breath  . Bromsulphalein   . Sulfa Antibiotics     Severe vomiting    Health Maintenance  Topic Date Due  . Hepatitis C Screening  Never done  . COVID-19 Vaccine (4 - Booster for Pfizer series) 05/24/2020  . INFLUENZA VACCINE  09/13/2020  . MAMMOGRAM  06/02/2021  . COLONOSCOPY (Pts 45-62yrs Insurance coverage will need to be confirmed)  10/07/2021  . DEXA SCAN  Completed  . PNA vac Low Risk Adult  Completed  . HPV VACCINES  Aged Out  .  TETANUS/TDAP  Discontinued    Chart Review Today: I personally reviewed active problem list, medication list, allergies, family history, social history, health maintenance, notes from last encounter, lab results, imaging with the patient/caregiver today.  Review of Systems  All other systems reviewed and are negative for acute change except as noted in the HPI.  Objective:   Vitals:   06/09/20 1421  BP: 124/64  Pulse: 73  Resp: 16  Temp: 98.3 F (36.8 C)  SpO2: 98%  Weight: 183 lb 3.2 oz (83.1 kg)  Height: 5\' 4"  (1.626 m)    Body mass index is 31.45 kg/m.  Physical Exam Vitals and nursing note reviewed.  Constitutional:      General: She is not in acute distress.    Appearance: Normal appearance. She is well-developed. She is obese. She is not ill-appearing, toxic-appearing or diaphoretic.     Interventions: Face mask in place.  HENT:     Head: Normocephalic and atraumatic.     Right Ear: External ear normal.     Left Ear: External ear normal.  Eyes:     General: Lids are normal. No scleral icterus.       Right eye: No discharge.        Left eye: No discharge.     Conjunctiva/sclera: Conjunctivae normal.  Neck:     Trachea: Phonation normal. No tracheal deviation.  Cardiovascular:     Rate and Rhythm: Normal rate and regular rhythm.     Pulses: Normal pulses.          Radial pulses are 2+ on the right side and 2+ on the left side.       Posterior tibial pulses are 2+ on the right side and 2+ on the left side.     Heart sounds: Normal heart sounds. No murmur heard. No friction rub. No gallop.   Pulmonary:     Effort: Pulmonary effort is normal. No respiratory distress.     Breath sounds: Normal breath sounds. No stridor. No wheezing, rhonchi or rales.  Chest:     Chest wall: No tenderness.  Abdominal:     General: Bowel sounds are normal. There is no distension.     Palpations: Abdomen is soft.     Tenderness: There is no abdominal tenderness. There is no  right CVA tenderness, left CVA tenderness or guarding.  Musculoskeletal:     Right lower leg: No edema.     Left lower leg: No edema.  Skin:    General: Skin is warm and dry.     Coloration: Skin is not jaundiced or pale.     Findings: No rash.  Neurological:     Mental Status: She is alert.     Motor: No abnormal muscle tone.     Gait: Gait normal.  Psychiatric:        Mood and Affect: Mood normal.        Speech: Speech normal.        Behavior: Behavior normal.         Assessment & Plan:     ICD-10-CM   1. Mixed hyperlipidemia  E78.2 lovastatin (MEVACOR) 20 MG tablet    COMPLETE METABOLIC PANEL WITH GFR   Compliant with statin no myalgias, encouraged healthy diet and lifestyle  2. GAD (generalized anxiety disorder)  F41.1 hydrOXYzine (ATARAX/VISTARIL) 25 MG tablet    COMPLETE METABOLIC PANEL WITH GFR   Supportive listening, still scoring high on PHQ and gad 7 but overall feels she is managing her stressors better, encouraged her to find caregiver support/break  3. Encounter for hepatitis C screening test for low risk patient  Z11.59 Hepatitis C Antibody  4. Situational stress  F43.9 hydrOXYzine (ATARAX/VISTARIL)  25 MG tablet   Encourage continued Lexapro, trial of hydroxyzine, caregiver support and referrals  5. PSVT (paroxysmal supraventricular tachycardia) (HCC)  I47.1    No recent palpitations or tachycardia, she has meds for as needed use rarely uses  6. Impaired fasting glucose  [id number] COMPLETE METABOLIC PANEL WITH GFR    Hemoglobin A1c  7. Osteopenia of neck of femur, unspecified laterality  [id number] COMPLETE METABOLIC PANEL WITH GFR   Reviewed supplements and bone density scans  8. Vitamin D deficiency  [id number] COMPLETE METABOLIC PANEL WITH GFR   Continue vitamin D supplement, will continue to monitor vitamin D and calcium intermittently and bone scans  9. Gastroesophageal reflux disease without esophagitis  K21.9    Symptoms currently well controlled with  over-the-counter Pepcid as needed  10. Hypertriglyceridemia  [id number] COMPLETE METABOLIC PANEL WITH GFR  11. Encounter for medication monitoring  [id number] COMPLETE METABOLIC PANEL WITH GFR    CBC with Differential/Platelet    Hemoglobin A1c  12. Current mild episode of major depressive disorder without prior episode (HCC) Chronic F32.0 escitalopram (LEXAPRO) 10 MG tablet   Continued mild symptoms but improved since last visit continue Lexapro  13. Urinary frequency  R35.0 Urinalysis, Routine w reflex microscopic   Screen urine for infection, urinary frequency handout given, may need further evaluation by urology, consider treatment for atrophic vaginitis  14. Urinary urgency  R39.15 Urinalysis, Routine w reflex microscopic     Return for 2 month virtual f/up for lexapro dose change.   [name], PA-C 06/09/20 2:30 PM

## 2020-06-10 LAB — COMPLETE METABOLIC PANEL WITH GFR
AG Ratio: 2.3 (calc) (ref 1.0–2.5)
ALT: 14 U/L (ref 6–29)
AST: 18 U/L (ref 10–35)
Albumin: 4.2 g/dL (ref 3.6–5.1)
Alkaline phosphatase (APISO): 55 U/L (ref 37–153)
BUN/Creatinine Ratio: 11 (calc) (ref 6–22)
BUN: 12 mg/dL (ref 7–25)
CO2: 30 mmol/L (ref 20–32)
Calcium: 9.2 mg/dL (ref 8.6–10.4)
Chloride: 106 mmol/L (ref 98–110)
Creat: 1.06 mg/dL — ABNORMAL HIGH (ref 0.60–0.93)
GFR, Est African American: 59 mL/min/{1.73_m2} — ABNORMAL LOW (ref >=60)
GFR, Est Non African American: 51 mL/min/{1.73_m2} — ABNORMAL LOW (ref >=60)
Globulin: 1.8 g/dL (calc) — ABNORMAL LOW (ref 1.9–3.7)
Glucose, Bld: 90 mg/dL (ref 65–99)
Potassium: 4.2 mmol/L (ref 3.5–5.3)
Sodium: 142 mmol/L (ref 135–146)
Total Bilirubin: 0.4 mg/dL (ref 0.2–1.2)
Total Protein: 6 g/dL — ABNORMAL LOW (ref 6.1–8.1)

## 2020-06-10 LAB — HEMOGLOBIN A1C
Hgb A1c MFr Bld: 5.4 % of total Hgb (ref <5.7)
Mean Plasma Glucose: 108 mg/dL
eAG (mmol/L): 6 mmol/L

## 2020-06-10 LAB — CBC WITH DIFFERENTIAL/PLATELET
Absolute Monocytes: 505 cells/uL (ref 200–950)
Basophils Absolute: 81 cells/uL (ref 0–200)
Basophils Relative: 0.8 %
Eosinophils Absolute: 263 cells/uL (ref 15–500)
Eosinophils Relative: 2.6 %
HCT: 44.2 % (ref 35.0–45.0)
Hemoglobin: 14.7 g/dL (ref 11.7–15.5)
Lymphs Abs: 4636 cells/uL — ABNORMAL HIGH (ref 850–3900)
MCH: 31.5 pg (ref 27.0–33.0)
MCHC: 33.3 g/dL (ref 32.0–36.0)
MCV: 94.8 fL (ref 80.0–100.0)
MPV: 10.8 fL (ref 7.5–12.5)
Monocytes Relative: 5 %
Neutro Abs: 4616 cells/uL (ref 1500–7800)
Neutrophils Relative %: 45.7 %
Platelets: 187 10*3/uL (ref 140–400)
RBC: 4.66 10*6/uL (ref 3.80–5.10)
RDW: 13.4 % (ref 11.0–15.0)
Total Lymphocyte: 45.9 %
WBC: 10.1 10*3/uL (ref 3.8–10.8)

## 2020-06-10 LAB — URINALYSIS, ROUTINE W REFLEX MICROSCOPIC
Bilirubin Urine: NEGATIVE
Glucose, UA: NEGATIVE
Hgb urine dipstick: NEGATIVE
Ketones, ur: NEGATIVE
Leukocytes,Ua: NEGATIVE
Nitrite: NEGATIVE
Protein, ur: NEGATIVE
Specific Gravity, Urine: 1.023 (ref 1.001–1.035)
pH: <=5 (ref 5.0–8.0)

## 2020-06-10 LAB — HEPATITIS C ANTIBODY
Hepatitis C Ab: NONREACTIVE
SIGNAL TO CUT-OFF: 0.01 (ref <1.00)

## 2020-06-16 ENCOUNTER — Other Ambulatory Visit: Payer: Self-pay

## 2020-06-16 ENCOUNTER — Ambulatory Visit: Payer: PPO | Admitting: Dermatology

## 2020-06-16 DIAGNOSIS — L720 Epidermal cyst: Secondary | ICD-10-CM

## 2020-06-16 DIAGNOSIS — Z1283 Encounter for screening for malignant neoplasm of skin: Secondary | ICD-10-CM | POA: Diagnosis not present

## 2020-06-16 DIAGNOSIS — L578 Other skin changes due to chronic exposure to nonionizing radiation: Secondary | ICD-10-CM | POA: Diagnosis not present

## 2020-06-16 DIAGNOSIS — L814 Other melanin hyperpigmentation: Secondary | ICD-10-CM

## 2020-06-16 DIAGNOSIS — Z85828 Personal history of other malignant neoplasm of skin: Secondary | ICD-10-CM

## 2020-06-16 DIAGNOSIS — Z86018 Personal history of other benign neoplasm: Secondary | ICD-10-CM

## 2020-06-16 DIAGNOSIS — L820 Inflamed seborrheic keratosis: Secondary | ICD-10-CM

## 2020-06-16 DIAGNOSIS — D229 Melanocytic nevi, unspecified: Secondary | ICD-10-CM | POA: Diagnosis not present

## 2020-06-16 DIAGNOSIS — D1800 Hemangioma unspecified site: Secondary | ICD-10-CM | POA: Diagnosis not present

## 2020-06-16 DIAGNOSIS — L821 Other seborrheic keratosis: Secondary | ICD-10-CM

## 2020-06-16 NOTE — Patient Instructions (Signed)

## 2020-06-16 NOTE — Progress Notes (Signed)
Follow-Up Visit   Subjective  [name] is a 75 y.o. female who presents for the following: Annual Exam (History of dysplastic nevus and BCC - TBSE today) and Other (Spot near corner of left eye x few months). The patient presents for Total-Body Skin Exam (TBSE) for skin cancer screening and mole check.  The following portions of the chart were reviewed this encounter and updated as appropriate:   Tobacco  Allergies  Meds  Problems  Med Hx  Surg Hx  Fam Hx     Review of Systems:  No other skin or systemic complaints except as noted in HPI or Assessment and Plan.  Objective  Well appearing patient in no apparent distress; mood and affect are within normal limits.  A full examination was performed including scalp, head, eyes, ears, nose, lips, neck, chest, axillae, abdomen, back, buttocks, bilateral upper extremities, bilateral lower extremities, hands, feet, fingers, toes, fingernails, and toenails. All findings within normal limits unless otherwise noted below.  Objective  Glabella: Subcutaneous nodule.   Objective  Left lat canthus: Erythematous keratotic or waxy stuck-on papule or plaque.    Assessment & Plan    Lentigines - Scattered tan macules - Due to sun exposure - Benign-appering, observe - Recommend daily broad spectrum sunscreen SPF 30+ to sun-exposed areas, reapply every 2 hours as needed. - Call for any changes  Seborrheic Keratoses - Stuck-on, waxy, tan-brown papules and/or plaques  - Benign-appearing - Discussed benign etiology and prognosis. - Observe - Call for any changes  Melanocytic Nevi - Tan-brown and/or pink-flesh-colored symmetric macules and papules - Benign appearing on exam today - Observation - Call clinic for new or changing moles - Recommend daily use of broad spectrum spf 30+ sunscreen to sun-exposed areas.   Hemangiomas - Red papules - Discussed benign nature - Observe - Call for any changes  Actinic Damage -  Chronic condition, secondary to cumulative UV/sun exposure - diffuse scaly erythematous macules with underlying dyspigmentation - Recommend daily broad spectrum sunscreen SPF 30+ to sun-exposed areas, reapply every 2 hours as needed.  - Staying in the shade or wearing long sleeves, sun glasses (UVA+UVB protection) and wide brim hats (4-inch brim around the entire circumference of the hat) are also recommended for sun protection.  - Call for new or changing lesions.  Skin cancer screening performed today.  History of Dysplastic Nevi - No evidence of recurrence today - Recommend regular full body skin exams - Recommend daily broad spectrum sunscreen SPF 30+ to sun-exposed areas, reapply every 2 hours as needed.  - Call if any new or changing lesions are noted between office visits  History of Basal Cell Carcinoma of the Skin - No evidence of recurrence today - Recommend regular full body skin exams - Recommend daily broad spectrum sunscreen SPF 30+ to sun-exposed areas, reapply every 2 hours as needed.  - Call if any new or changing lesions are noted between office visits  Epidermal inclusion cyst Glabella Discussed excision  Inflamed seborrheic keratosis Left lat canthus Destruction of lesion - Left lat canthus Complexity: simple   Destruction method: cryotherapy   Informed consent: discussed and consent obtained   Timeout:  patient name, date of birth, surgical site, and procedure verified Lesion destroyed using liquid nitrogen: Yes   Region frozen until ice ball extended beyond lesion: Yes   Outcome: patient tolerated procedure well with no complications   Post-procedure details: wound care instructions given    Skin cancer screening  Return in about  1 year (around 06/16/2021) for TBSE.   I, Ashok Cordia, CMA, am acting as scribe for Sarina Ser, MD .  Documentation: I have reviewed the above documentation for accuracy and completeness, and I agree with the above.  Sarina Ser, MD

## 2020-06-19 ENCOUNTER — Encounter: Payer: Self-pay | Admitting: Dermatology

## 2020-07-17 ENCOUNTER — Other Ambulatory Visit: Payer: Self-pay | Admitting: Family Medicine

## 2020-07-17 DIAGNOSIS — F439 Reaction to severe stress, unspecified: Secondary | ICD-10-CM

## 2020-07-17 DIAGNOSIS — F411 Generalized anxiety disorder: Secondary | ICD-10-CM

## 2020-08-04 ENCOUNTER — Telehealth (INDEPENDENT_AMBULATORY_CARE_PROVIDER_SITE_OTHER): Payer: PPO | Admitting: Family Medicine

## 2020-08-04 ENCOUNTER — Encounter: Payer: Self-pay | Admitting: Family Medicine

## 2020-08-04 ENCOUNTER — Other Ambulatory Visit: Payer: Self-pay

## 2020-08-04 DIAGNOSIS — F320 Major depressive disorder, single episode, mild: Secondary | ICD-10-CM | POA: Diagnosis not present

## 2020-08-04 DIAGNOSIS — I471 Supraventricular tachycardia: Secondary | ICD-10-CM

## 2020-08-04 DIAGNOSIS — Z5181 Encounter for therapeutic drug level monitoring: Secondary | ICD-10-CM | POA: Diagnosis not present

## 2020-08-04 DIAGNOSIS — N1831 Chronic kidney disease, stage 3a: Secondary | ICD-10-CM | POA: Diagnosis not present

## 2020-08-04 DIAGNOSIS — F411 Generalized anxiety disorder: Secondary | ICD-10-CM

## 2020-08-04 DIAGNOSIS — E782 Mixed hyperlipidemia: Secondary | ICD-10-CM

## 2020-08-04 DIAGNOSIS — F439 Reaction to severe stress, unspecified: Secondary | ICD-10-CM | POA: Diagnosis not present

## 2020-08-04 DIAGNOSIS — F5102 Adjustment insomnia: Secondary | ICD-10-CM

## 2020-08-04 NOTE — Progress Notes (Signed)
Name: [name]   MRN: [id number]    DOB: Jan 14, 1946   Date:08/04/2020       Progress Note  Subjective:    Chief Complaint  Chief Complaint  Patient presents with   Follow-up    I connected with  [name] on 08/04/20 at  9:40 AM EDT by telephone and verified that I am speaking with the correct person using two identifiers.  I discussed the limitations, risks, security and privacy concerns of performing an evaluation and management service by telephone and the availability of in person appointments. Staff also discussed with the patient that there may be a patient responsible charge related to this service. Patient Location: home Provider Location: [facility] clinic office Additional Individuals present: none  HPI Recent dose increase of lexapro from 10 to 15 mg daily, addition/trial of hydroxyzine, pt cares for her husband, anxiety and depression sx were increasing with situational stress as caregiver She presents today for follow up Depression screen [facility] 2/9 08/04/2020 06/09/2020 03/02/2020  Decreased Interest 0 0 1  Down, Depressed, Hopeless 0 0 1  PHQ - 2 Score 0 0 2  Altered sleeping 0 0 1  Tired, decreased energy 0 0 1  Change in appetite 0 3 1  Feeling bad or failure about yourself  0 2 1  Trouble concentrating 0 1 0  Moving slowly or fidgety/restless 0 0 1  Suicidal thoughts 0 0 0  PHQ-9 Score 0 6 7  Difficult doing work/chores Not difficult at all Not difficult at all Not difficult at all  Some recent data might be hidden   GAD 7 : Generalized Anxiety Score 08/04/2020 06/09/2020 12/03/2019 11/28/2018  Nervous, Anxious, on Edge 0 2 2 2   Control/stop worrying 0 2 2 2   Worry too much - different things 0 2 2 2   Trouble relaxing 0 2 2 2   Restless 0 1 1 1   Easily annoyed or irritable 0 1 1 1   Afraid - awful might happen 0 1 1 0  Total GAD 7 Score 0 11 11 10   Anxiety Difficulty Not difficult at all Somewhat difficult Very difficult Somewhat difficult   Insomnia -  better with lexapro dose increase and hydroxyzine 25 mg at bedtime some nights, getting 4-5 hours of more solid sleep  HLD - on lovastatin - due for lipids in Oct Tolerating, no SE or concerns Lab Results  Component Value Date   CHOL 174 11/17/2019   HDL 49 (L) 11/17/2019   LDLCALC 90 11/17/2019   TRIG 269 (H) 11/17/2019   CHOLHDL 3.6 11/17/2019   PSVT - on metoprolol 12.5 mg BID per cardiology she has f/up next month, doing well, no concerns  Patient Active Problem List   Diagnosis Date Noted   PSVT (paroxysmal supraventricular tachycardia) ([place]) 08/14/2019   Vitamin D deficiency 06/10/2019   Hypocalcemia 06/10/2019   Palpitations 03/14/2019   Atypical chest pain 03/14/2019   Right arm weakness 03/14/2019   Current mild episode of major depressive disorder without prior episode ([place]) 10/15/2018   GAD (generalized anxiety disorder) 10/15/2018   Personal history of colonic polyps    Polyp of transverse colon    Obesity (BMI 30.0-34.9) 05/29/2018   GERD (gastroesophageal reflux disease) 11/27/2017   Osteopenia 11/27/2017   Mixed hyperlipidemia 12/09/2014   Insomnia 12/09/2014   Anxiety as acute reaction to exceptional stress 12/09/2014    Current Outpatient Medications:    calcium carbonate (OS-CAL - DOSED IN MG OF ELEMENTAL CALCIUM) 1250 (500  Ca) MG tablet, Take 1 tablet by mouth daily with breakfast., Disp: , Rfl:    escitalopram (LEXAPRO) 10 MG tablet, Take 1.5 tablets (15 mg total) by mouth daily., Disp: 135 tablet, Rfl: 3   famotidine (PEPCID) 10 MG tablet, Take 1 tablet (10 mg total) by mouth 2 (two) times daily as needed for heartburn or indigestion., Disp: , Rfl:    GLUCOSAMINE-CHONDROIT-CALCIUM PO, Take 1,000 mg by mouth daily., Disp: , Rfl:    hydrOXYzine (ATARAX/VISTARIL) 25 MG tablet, TAKE 1-2 TABLETS (25-50 MG TOTAL) BY MOUTH AT BEDTIME AS NEEDED FOR ANXIETY (INSOMNIA)., Disp: 180 tablet, Rfl: 2   lovastatin (MEVACOR) 20 MG tablet, TAKE 1 TABLET BY MOUTH EVERYDAY AT  BEDTIME, Disp: 90 tablet, Rfl: 3   Multiple Vitamin (MULTIVITAMIN) tablet, Take 1 tablet by mouth daily., Disp: , Rfl:    vitamin B-12 (CYANOCOBALAMIN) 1000 MCG tablet, Take 1,000 mcg by mouth daily., Disp: , Rfl:    metoprolol tartrate (LOPRESSOR) 25 MG tablet, Take 0.5 tablets (12.5 mg total) by mouth 2 (two) times daily as needed (palpitations)., Disp: 30 tablet, Rfl: 6 Allergies  Allergen Reactions   Tetanus Toxoids Shortness Of Breath   Bromsulphalein    Sulfa Antibiotics     Severe vomiting    Past Surgical History:  Procedure Laterality Date   BREAST BIOPSY Right    core bx- neg   COLONOSCOPY     COLONOSCOPY WITH PROPOFOL N/A 10/08/2018   Procedure: COLONOSCOPY WITH PROPOFOL;  Surgeon: Lucilla Lame, MD;  Location: [facility] ENDOSCOPY;  Service: Endoscopy;  Laterality: N/A;   MOHS SURGERY     TONSILLECTOMY     VAGINAL HYSTERECTOMY     Family History  Problem Relation Age of Onset   Colon cancer Paternal Uncle    Rectal cancer Paternal Uncle    Cancer Mother        lung    Diabetes Father    Parkinson's disease Father    Alzheimer's disease Father    Heart attack Father 51   Alcohol abuse Sister    Heart disease Sister    Lung cancer Sister    Cancer Son        livcolon, liver stage 4   Liver cancer Son    Stroke Paternal Grandmother    Cancer Paternal Grandfather        lung   Down syndrome Sister    Esophageal cancer Neg Hx    Stomach cancer Neg Hx    Breast cancer Neg Hx    Social History   Socioeconomic History   Marital status: Married    Spouse name: [name]   Number of children: 2   Years of education: Not on file   Highest education level: Bachelor's degree (e.g., BA, AB, BS)  Occupational History   Occupation: Retired  Tobacco Use   Smoking status: Former    Packs/day: 1.50    Years: 20.00    Pack years: 30.00    Types: Cigarettes    Quit date: 1993    Years since quitting: 29.4   Smokeless tobacco: Never  Vaping Use   Vaping Use: Never used   Substance and Sexual Activity   Alcohol use: Not Currently   Drug use: Never   Sexual activity: Not Currently  Other Topics Concern   Not on file  Social History Narrative   Not on file   Social Determinants of Health   Financial Resource Strain: Low Risk    Difficulty of Paying Living Expenses: Not  hard at all  Food Insecurity: No Food Insecurity   Worried About Charity fundraiser in the Last Year: Never true   Ran Out of Food in the Last Year: Never true  Transportation Needs: No Transportation Needs   Lack of Transportation (Medical): No   Lack of Transportation (Non-Medical): No  Physical Activity: Sufficiently Active   Days of Exercise per Week: 5 days   Minutes of Exercise per Session: 40 min  Stress: No Stress Concern Present   Feeling of Stress : Not at all  Social Connections: Moderately Integrated   Frequency of Communication with Friends and Family: More than three times a week   Frequency of Social Gatherings with Friends and Family: Three times a week   Attends Religious Services: More than 4 times per year   Active Member of Clubs or Organizations: No   Attends Archivist Meetings: Never   Marital Status: Married  Human resources officer Violence: Not At Risk   Fear of Current or Ex-Partner: No   Emotionally Abused: No   Physically Abused: No   Sexually Abused: No    Chart Review Today: I personally reviewed active problem list, medication list, allergies, family history, social history, health maintenance, notes from last encounter, lab results, imaging with the patient/caregiver today.  Review of Systems  Constitutional: Negative.   HENT: Negative.    Eyes: Negative.   Respiratory: Negative.    Cardiovascular: Negative.   Gastrointestinal: Negative.   Endocrine: Negative.   Genitourinary: Negative.   Musculoskeletal: Negative.   Skin: Negative.   Allergic/Immunologic: Negative.   Neurological: Negative.   Hematological: Negative.    Psychiatric/Behavioral: Negative.    All other systems reviewed and are negative.   Objective:    Virtual encounter, vitals limited, only able to obtain the following: There were no vitals filed for this visit. There is no height or weight on file to calculate BMI. Nursing Note and Vital Signs reviewed.  Physical Exam Vitals and nursing note reviewed.  Neurological:     Mental Status: She is alert.  Psychiatric:        Attention and Perception: Attention normal.        Mood and Affect: Mood normal.        Speech: Speech normal.        Behavior: Behavior is cooperative.        Thought Content: Thought content normal. Thought content does not include homicidal or suicidal ideation. Thought content does not include homicidal or suicidal plan.    PE limited by telephone encounter   PHQ2/9: Depression screen [facility] 2/9 08/04/2020 06/09/2020 03/02/2020 12/03/2019 12/03/2019  Decreased Interest 0 0 1 1 1   Down, Depressed, Hopeless 0 0 1 1 1   PHQ - 2 Score 0 0 2 2 2   Altered sleeping 0 0 1 2 -  Tired, decreased energy 0 0 1 1 -  Change in appetite 0 3 1 2  -  Feeling bad or failure about yourself  0 2 1 1  -  Trouble concentrating 0 1 0 0 -  Moving slowly or fidgety/restless 0 0 1 0 -  Suicidal thoughts 0 0 0 0 -  PHQ-9 Score 0 6 7 8  -  Difficult doing work/chores Not difficult at all Not difficult at all Not difficult at all Somewhat difficult -  Some recent data might be hidden   PHQ-2/9 Result is neg - reviewed  - see HPI  Fall Risk: Fall Risk  08/04/2020 06/09/2020 03/02/2020 12/03/2019 05/29/2019  Falls in the past year? 0 1 1 1  0  Comment - - - - -  Number falls in past yr: 0 1 1 1  0  Injury with Fall? 0 0 0 0 0  Risk for fall due to : - - History of fall(s) History of fall(s) -  Risk for fall due to: Comment - - - - -  Follow up Falls evaluation completed - Falls prevention discussed - -     Assessment and Plan:     ICD-10-CM   1. GAD (generalized anxiety disorder)   F41.1    improved sx with increased lexapro dose to 15 mg daily and using hydroxyzine at bedtime, she has SW and caregiver support resources    2. Situational stress  F43.9    improved day to day symptoms, referrals and support in place, encouraged her to f/up if needs or situation changes    3. Current mild episode of major depressive disorder without prior episode ([place])  F32.0    improved sx, phq 9 was 11, today score of 0, improved mood, feels more like herself, no SE or concerns with lexapro dose change    4. Insomnia due to stress  F51.02    improved with hydroxyzine at bedtime    5. Stage 3a chronic kidney disease (HCC)  H67.59 COMPLETE METABOLIC PANEL WITH GFR   recheck renal function with next labs, last labs GFR was 51, labs prior to that GFR 62-75, pt BP well controlled, encouraged to hydrate, avoid NSAIDs recheck    6. Mixed hyperlipidemia  F63.8 COMPLETE METABOLIC PANEL WITH GFR    Lipid panel   labs due by Oct - ordered today, good statin compliance, last lipids were well controlled, no myalgias SE or concerns, cardiology f/up in July    7. Encounter for medication monitoring  G66.59 COMPLETE METABOLIC PANEL WITH GFR    Lipid panel    8. PSVT (paroxysmal supraventricular tachycardia) (HCC)  I47.1    per cardiology, currently well controlled on low dose metoprolol, has f/up next month with specialists       I discussed the assessment and treatment plan with the patient. The patient was provided an opportunity to ask questions and all were answered. The patient agreed with the plan and demonstrated an understanding of the instructions.   The patient was advised to call back or seek an in-person evaluation if the symptoms worsen or if the condition fails to improve as anticipated.  I provided 18 minutes of non-face-to-face time during this encounter. 10 min on telephone call with pt, 8+ min of chart documentation, chart review, ordering labs and arranging f/up   F/up labs  due by Oct [place] in Jan 2023 Needs in person OV by April 2023    [name], PA-C 08/04/20 10:10 AM

## 2020-08-09 ENCOUNTER — Ambulatory Visit: Payer: PPO | Admitting: Family Medicine

## 2020-08-09 NOTE — Progress Notes (Signed)
Cardiology Office Note    Date:  08/13/2020   ID:  [name], DOB [birth year], MRN [id number]  PCP:  Elizabeth Grana, PA-C  Cardiologist:  Elizabeth Bush, MD  Electrophysiologist:  None   Chief Complaint: Follow-up  History of Present Illness:   [name] is a 75 y.o. female with history of SVT, HLD, asthma, depression, generalized anxiety disorder, and colon polyps who presents for follow-up of  SVT.   She was evaluated by Dr. Saunders [name] as a new patient on 03/14/2019 for palpitations in the setting of increased stress.  There was a remote isolated episode of syncope while donating blood many years prior.  She reported a stress test approximately 20 years prior as part of a CPE that she believed was normal.  Zio patch in 02/2019, showed a predominant rhythm of sinus with an average heart rate of 64 bpm (range 44 to 132 bpm in sinus), rare PACs and PVCs.  There were 20 atrial runs lasting up to 12.1 seconds with a maximal rate of 184 bpm.  There were no sustained arrhythmias or prolonged pauses.  Patient triggered events corresponded to sinus rhythm.  Echo 04/23/2019 showed an EF of 55 to 60%, no RWMA, normal diastolic function, normal RV systolic function and cavity size, and trivial mitral regurgitation.  She was seen in follow-up in 04/2019, continuing to note episodic chest discomfort that was described as a "pinch" and was randomly occurring lasting for several seconds in duration with spontaneous resolution.  In this setting, she underwent Lexiscan MPI in 05/2019 which showed no significant ischemia with CT attenuation corrected images showing mild aortic atherosclerosis with no significant coronary artery calcification noted.  Overall, this was a low risk study.  She was last seen in the office in 07/2019 and was doing relatively well.  She noted only 2 episodes of palpitations since her last visit.  1 occurred in the setting of significant stress.  She had not needed any as needed metoprolol.  Her  chronic exertional dyspnea was unchanged.  No changes were made at that time.  She comes in doing well from a cardiac perspective.  Since she was last seen she has had 4 episodes of tachypalpitations consistent with prior SVT with symptoms lasting 5 to 6 minutes prior to taking as needed metoprolol with subsequent symptom resolution within approximately 30 minutes.  Each episode of significant tachypalpitations occurred with significant stressors.  With these episodes she does note a "pinch" in her chest that feels similar to what she experienced in 2021, at which time she underwent stress testing which was a low risk.  She otherwise is doing well and does not have any issues or concerns at this time.  Labs independently reviewed: 05/2020 - A1c 5.4, Hgb 14.7, PLT 187, BUN 12, serum creatinine 1.06, potassium 4.2, albumin 4.2, AST/ALT normal 11/2019 - TC 174, TG 269, HDL 49, LDL 90 02/2016 - TSH normal  Past Medical History:  Diagnosis Date   Allergy    Arthritis    Asthma    Atypical chest pain 03/14/2019   Atypical mole 06/12/2018   right prox lat thigh/mod   Atypical mole 05/19/2014   left sup med buttock/excision   Atypical mole 03/26/2014   right sup lat buttock   Atypical mole 03/26/2013   left lat mid back   Atypical mole 11/08/2006   right shoulder superior/excision   Basal cell carcinoma 06/12/2016   right medial sup canthus below eyebrow   Basal cell  carcinoma 09/23/2014   left nasal bridge   Basal cell carcinoma 03/26/2013   left nasal sidewall   Basal cell carcinoma 08/27/2012   left nasal dorsum   Cancer (HCC)    shoulder, nose skin CA- basal cell CA   GERD (gastroesophageal reflux disease)    occasional   Hyperlipidemia    Osteopenia    Polyp of transverse colon    Right arm weakness 03/14/2019   Stress due to illness of family member 05/29/2018    Past Surgical History:  Procedure Laterality Date   BREAST BIOPSY Right    core bx- neg   COLONOSCOPY      COLONOSCOPY WITH PROPOFOL N/A 10/08/2018   Procedure: COLONOSCOPY WITH PROPOFOL;  Surgeon: Lucilla Lame, MD;  Location: [facility] ENDOSCOPY;  Service: Endoscopy;  Laterality: N/A;   MOHS SURGERY     TONSILLECTOMY     VAGINAL HYSTERECTOMY      Current Medications: Current Meds  Medication Sig   ASPIRIN PO Take by mouth as needed.   calcium carbonate (OS-CAL - DOSED IN MG OF ELEMENTAL CALCIUM) 1250 (500 Ca) MG tablet Take 1 tablet by mouth daily with breakfast.   escitalopram (LEXAPRO) 10 MG tablet Take 1.5 tablets (15 mg total) by mouth daily.   famotidine (PEPCID) 10 MG tablet Take 1 tablet (10 mg total) by mouth 2 (two) times daily as needed for heartburn or indigestion.   GLUCOSAMINE-CHONDROIT-CALCIUM PO Take 1,000 mg by mouth daily.   hydrOXYzine (ATARAX/VISTARIL) 25 MG tablet TAKE 1-2 TABLETS (25-50 MG TOTAL) BY MOUTH AT BEDTIME AS NEEDED FOR ANXIETY (INSOMNIA).   lovastatin (MEVACOR) 20 MG tablet TAKE 1 TABLET BY MOUTH EVERYDAY AT BEDTIME   metoprolol tartrate (LOPRESSOR) 25 MG tablet Take 0.5 tablets (12.5 mg total) by mouth 2 (two) times daily as needed (palpitations).   Multiple Vitamin (MULTIVITAMIN) tablet Take 1 tablet by mouth daily.   vitamin B-12 (CYANOCOBALAMIN) 1000 MCG tablet Take 1,000 mcg by mouth daily.    Allergies:   Tetanus toxoids, Bromsulphalein, and Sulfa antibiotics   Social History   Socioeconomic History   Marital status: Married    Spouse name: [name]   Number of children: 2   Years of education: Not on file   Highest education level: Bachelor's degree (e.g., BA, AB, BS)  Occupational History   Occupation: Retired  Tobacco Use   Smoking status: Former    Packs/day: 1.50    Years: 20.00    Pack years: 30.00    Types: Cigarettes    Quit date: 1993    Years since quitting: 29.5   Smokeless tobacco: Never  Vaping Use   Vaping Use: Never used  Substance and Sexual Activity   Alcohol use: Not Currently   Drug use: Never   Sexual activity: Not Currently   Other Topics Concern   Not on file  Social History Narrative   Not on file   Social Determinants of Health   Financial Resource Strain: Low Risk    Difficulty of Paying Living Expenses: Not hard at all  Food Insecurity: No Food Insecurity   Worried About Charity fundraiser in the Last Year: Never true   [place] in the Last Year: Never true  Transportation Needs: No Transportation Needs   Lack of Transportation (Medical): No   Lack of Transportation (Non-Medical): No  Physical Activity: Sufficiently Active   Days of Exercise per Week: 5 days   Minutes of Exercise per Session: 40 min  Stress: No  Stress Concern Present   Feeling of Stress : Not at all  Social Connections: Moderately Integrated   Frequency of Communication with Friends and Family: More than three times a week   Frequency of Social Gatherings with Friends and Family: Three times a week   Attends Religious Services: More than 4 times per year   Active Member of Clubs or Organizations: No   Attends Archivist Meetings: Never   Marital Status: Married     Family History:  The patient's family history includes Alcohol abuse in her sister; Alzheimer's disease in her father; Cancer in her mother, paternal grandfather, and son; Colon cancer in her paternal uncle; Diabetes in her father; Down syndrome in her sister; Heart attack (age of onset: 38) in her father; Heart disease in her sister; Liver cancer in her son; Lung cancer in her sister; Parkinson's disease in her father; Rectal cancer in her paternal uncle; Stroke in her paternal grandmother. There is no history of Esophageal cancer, Stomach cancer, or Breast cancer.  ROS:   Review of Systems  Constitutional:  Negative for chills, diaphoresis, fever, malaise/fatigue and weight loss.  HENT:  Negative for congestion.   Eyes:  Negative for discharge and redness.  Respiratory:  Negative for cough, sputum production, shortness of breath and wheezing.    Cardiovascular:  Positive for chest pain and palpitations. Negative for orthopnea, claudication, leg swelling and PND.       Pinched in the chest associated with tachypalpitations  Gastrointestinal:  Negative for abdominal pain, heartburn, nausea and vomiting.  Musculoskeletal:  Negative for falls and myalgias.  Skin:  Negative for rash.  Neurological:  Negative for dizziness, tingling, tremors, sensory change, speech change, focal weakness, loss of consciousness and weakness.  Endo/Heme/Allergies:  Does not bruise/bleed easily.  Psychiatric/Behavioral:  Negative for substance abuse. The patient is not nervous/anxious.   All other systems reviewed and are negative.   EKGs/Labs/Other Studies Reviewed:    Studies reviewed were summarized above. The additional studies were reviewed today:  Zio patch 02/2019: Patient was monitored for 13 days, 21 hours. The predominant rhythm was sinus with an average rate of 64 bpm (range 44 to 132 bpm in sinus). There were rare PACs and PVCs. 20 atrial runs lasting up to 12.1 seconds with a maximal rate of 184 bpm occurred. No sustained arrhythmia or prolonged pause was identified. Patient triggered events correspond to sinus rhythm.   Predominantly sinus rhythm with rare PACs and PVCs as well as PSVT.  Patient triggered events correspond to sinus rhythm. __________   2D echo 04/23/2019:  1. Left ventricular ejection fraction, by estimation, is 55 to 60%. The  left ventricle has normal function. The left ventricle has no regional  wall motion abnormalities. Left ventricular diastolic parameters were  normal.   2. Right ventricular systolic function is normal. The right ventricular  size is normal.   3. The mitral valve is normal in structure. Trivial mitral valve  regurgitation. No evidence of mitral stenosis.   4. The aortic valve is normal in structure. Aortic valve regurgitation is  not visualized. No aortic stenosis is present.   5. The  inferior vena cava is normal in size with greater than 50%  respiratory variability, suggesting right atrial pressure of 3 mmHg. __________  Carlton Adam MPI 05/2019: Pharmacological myocardial perfusion imaging study with no significant  ischemia Normal wall motion, EF estimated at 81% No EKG changes concerning for ischemia at peak stress or in recovery. CT attenuation correction images  with mild aortic atherosclerosis, no significant coronary calcification noted Low risk scan   EKG:  EKG is ordered today.  The EKG ordered today demonstrates NSR, 60 bpm, nonspecific anterolateral ST-T changes which were previously noted  Recent Labs: 06/09/2020: ALT 14; BUN 12; Creat 1.06; Hemoglobin 14.7; Platelets 187; Potassium 4.2; Sodium 142  Recent Lipid Panel    Component Value Date/Time   CHOL 174 11/17/2019 0837   CHOL 158 07/07/2015 0817   TRIG 269 (H) 11/17/2019 0837   HDL 49 (L) 11/17/2019 0837   HDL 53 07/07/2015 0817   CHOLHDL 3.6 11/17/2019 0837   VLDL 38 (H) 05/31/2016 0825   LDLCALC 90 11/17/2019 0837    PHYSICAL EXAM:    VS:  BP 122/60 (BP Location: Left Arm, Patient Position: Sitting, Cuff Size: Normal)   Pulse 60   Ht 5\' 4"  (1.626 m)   Wt 180 lb 8 oz (81.9 kg)   LMP  (LMP Unknown)   SpO2 98%   BMI 30.98 kg/m   BMI: Body mass index is 30.98 kg/m.  Physical Exam Vitals reviewed.  Constitutional:      Appearance: She is well-developed.  HENT:     Head: Normocephalic and atraumatic.  Eyes:     General:        Right eye: No discharge.        Left eye: No discharge.  Neck:     Vascular: No JVD.  Cardiovascular:     Rate and Rhythm: Normal rate and regular rhythm.     Pulses:          Dorsalis pedis pulses are 2+ on the right side and 2+ on the left side.       Posterior tibial pulses are 2+ on the right side and 2+ on the left side.     Heart sounds: Normal heart sounds, S1 normal and S2 normal. Heart sounds not distant. No midsystolic click and no opening snap. No  murmur heard.   No friction rub.  Pulmonary:     Effort: Pulmonary effort is normal. No respiratory distress.     Breath sounds: Normal breath sounds. No decreased breath sounds, wheezing or rales.  Chest:     Chest wall: No tenderness.  Abdominal:     General: There is no distension.     Palpations: Abdomen is soft.     Tenderness: There is no abdominal tenderness.  Musculoskeletal:     Cervical back: Normal range of motion.  Skin:    General: Skin is warm and dry.     Nails: There is no clubbing.  Neurological:     Mental Status: She is alert and oriented to person, place, and time.  Psychiatric:        Speech: Speech normal.        Behavior: Behavior normal.        Thought Content: Thought content normal.        Judgment: Judgment normal.    Wt Readings from Last 3 Encounters:  08/13/20 180 lb 8 oz (81.9 kg)  06/09/20 183 lb 3.2 oz (83.1 kg)  12/03/19 180 lb 9.6 oz (81.9 kg)     ASSESSMENT & PLAN:   Paroxysmal SVT with associated palpitations: She continues to do well with minimal episodes of palpitations for which she has needed to take as needed metoprolol 4 times since she was last seen.  Due to her her resting heart rate in the low to mid 60s bpm and infrequent episodes, we  will continue her on as needed metoprolol 25 mg twice daily as needed.  Should her episodes become more frequent or longer lasting, scheduled medical therapy or EP evaluation could be discussed at that time.  HLD: LDL of 90 in 11/2019, followed by PCP.  No significant coronary artery calcifications noted on CT attenuation corrected images on stress test in 05/2019.  She remains on lovastatin.  Chest pain: She notes a history of a "pinch" in her chest during episodes of tachypalpitations.  Symptoms feel similar to what she experienced in 2021, at which time she had a negative Lexiscan MPI.  Given infrequent and stable symptoms continue medical therapy with aspirin and statin.  No indication for further  ischemic testing at this time.  Disposition: F/u with Dr. Saunders [name] or an APP in 12 months, sooner if needed.   Medication Adjustments/Labs and Tests Ordered: Current medicines are reviewed at length with the patient today.  Concerns regarding medicines are outlined above. Medication changes, Labs and Tests ordered today are summarized above and listed in the Patient Instructions accessible in Encounters.   Signed, Christell Faith, PA-C 08/13/2020 10:20 AM     [address] [place], [place] [address] [phone]

## 2020-08-13 ENCOUNTER — Encounter: Payer: Self-pay | Admitting: Physician Assistant

## 2020-08-13 ENCOUNTER — Other Ambulatory Visit: Payer: Self-pay

## 2020-08-13 ENCOUNTER — Ambulatory Visit: Payer: PPO | Admitting: Physician Assistant

## 2020-08-13 VITALS — BP 122/60 | HR 60 | Ht 64.0 in | Wt 180.5 lb

## 2020-08-13 DIAGNOSIS — I471 Supraventricular tachycardia, unspecified: Secondary | ICD-10-CM

## 2020-08-13 DIAGNOSIS — R0789 Other chest pain: Secondary | ICD-10-CM

## 2020-08-13 DIAGNOSIS — E782 Mixed hyperlipidemia: Secondary | ICD-10-CM | POA: Diagnosis not present

## 2020-08-13 NOTE — Patient Instructions (Signed)
Medication Instructions:  NONE *If you need a refill on your cardiac medications before your next appointment, please call your pharmacy*   Lab Work: NONE If you have labs (blood work) drawn today and your tests are completely normal, you will receive your results only by: [place] (if you have MyChart) OR A paper copy in the mail If you have any lab test that is abnormal or we need to change your treatment, we will call you to review the results.   Testing/Procedures: NONE   Follow-Up: At [facility], you and your health needs are our priority.  As part of our continuing mission to provide you with exceptional heart care, we have created designated Provider Care Teams.  These Care Teams include your primary Cardiologist (physician) and Advanced Practice Providers (APPs -  Physician Assistants and Nurse Practitioners) who all work together to provide you with the care you need, when you need it.  We recommend signing up for the patient portal called "MyChart".  Sign up information is provided on this After Visit Summary.  MyChart is used to connect with patients for Virtual Visits (Telemedicine).  Patients are able to view lab/test results, encounter notes, upcoming appointments, etc.  Non-urgent messages can be sent to your provider as well.   To learn more about what you can do with MyChart, go to [email or web address].    Your next appointment:   12 month(s)  The format for your next appointment:   In Person  Provider:   You may see [name], MD or one of the following Advanced Practice Providers on your designated Care Team:   Murray Hodgkins, NP Christell Faith, PA-C Marrianne Mood, PA-C Cadence [place], [place] Laurann Montana, NP

## 2020-08-30 DIAGNOSIS — R42 Dizziness and giddiness: Secondary | ICD-10-CM | POA: Diagnosis not present

## 2020-08-30 DIAGNOSIS — H903 Sensorineural hearing loss, bilateral: Secondary | ICD-10-CM | POA: Diagnosis not present

## 2020-08-30 DIAGNOSIS — H60543 Acute eczematoid otitis externa, bilateral: Secondary | ICD-10-CM | POA: Diagnosis not present

## 2020-11-22 DIAGNOSIS — H43393 Other vitreous opacities, bilateral: Secondary | ICD-10-CM | POA: Diagnosis not present

## 2021-01-17 ENCOUNTER — Other Ambulatory Visit (HOSPITAL_COMMUNITY): Payer: Self-pay | Admitting: Neurology

## 2021-01-17 DIAGNOSIS — G4700 Insomnia, unspecified: Secondary | ICD-10-CM | POA: Diagnosis not present

## 2021-01-17 DIAGNOSIS — Z8669 Personal history of other diseases of the nervous system and sense organs: Secondary | ICD-10-CM | POA: Diagnosis not present

## 2021-01-17 DIAGNOSIS — E669 Obesity, unspecified: Secondary | ICD-10-CM | POA: Diagnosis not present

## 2021-01-17 DIAGNOSIS — R413 Other amnesia: Secondary | ICD-10-CM

## 2021-01-28 ENCOUNTER — Other Ambulatory Visit: Payer: Self-pay

## 2021-01-28 ENCOUNTER — Ambulatory Visit (HOSPITAL_COMMUNITY)
Admission: RE | Admit: 2021-01-28 | Discharge: 2021-01-28 | Disposition: A | Discharge status: Home / Self Care | Payer: PPO | Source: Ambulatory Visit | Attending: Neurology | Admitting: Neurology

## 2021-01-28 DIAGNOSIS — R413 Other amnesia: Secondary | ICD-10-CM | POA: Diagnosis not present

## 2021-01-28 DIAGNOSIS — J3489 Other specified disorders of nose and nasal sinuses: Secondary | ICD-10-CM | POA: Diagnosis not present

## 2021-03-03 ENCOUNTER — Ambulatory Visit (INDEPENDENT_AMBULATORY_CARE_PROVIDER_SITE_OTHER): Payer: PPO

## 2021-03-03 DIAGNOSIS — Z0000 Encounter for general adult medical examination without abnormal findings: Secondary | ICD-10-CM | POA: Diagnosis not present

## 2021-03-03 NOTE — Patient Instructions (Signed)
[name] , Thank you for taking time to come for your Medicare Wellness Visit. I appreciate your ongoing commitment to your health goals. Please review the following plan we discussed and let me know if I can assist you in the future.   Screening recommendations/referrals: Colonoscopy: done 10/08/18. Repeat 09/2021 Mammogram: done 06/02/20. Please call [phone] to schedule your mammogram and bone density screening. Bone Density: done 08/14/18 Recommended yearly ophthalmology/optometry visit for glaucoma screening and checkup Recommended yearly dental visit for hygiene and checkup  Vaccinations: Influenza vaccine: done 11/10/20 Pneumococcal vaccine: done 09/21/17 Tdap vaccine: n/a Shingles vaccine: done 06/15/20; we will contact [facility] for your second Shingrix dose   Covid-19:done 03/28/19, 04/18/19, 11/24/19 & 11/22/20  Conditions/risks identified: Keep up the great work!  Next appointment: Follow up in one year for your annual wellness visit    Preventive Care 76 Years and Older, Female Preventive care refers to lifestyle choices and visits with your health care provider that can promote health and wellness. What does preventive care include? A yearly physical exam. This is also called an annual well check. Dental exams once or twice a year. Routine eye exams. Ask your health care provider how often you should have your eyes checked. Personal lifestyle choices, including: Daily care of your teeth and gums. Regular physical activity. Eating a healthy diet. Avoiding tobacco and drug use. Limiting alcohol use. Practicing safe sex. Taking low-dose aspirin every day. Taking vitamin and mineral supplements as recommended by your health care provider. What happens during an annual well check? The services and screenings done by your health care provider during your annual well check will depend on your age, overall health, lifestyle risk factors, and family history of  disease. Counseling  Your health care provider may ask you questions about your: Alcohol use. Tobacco use. Drug use. Emotional well-being. Home and relationship well-being. Sexual activity. Eating habits. History of falls. Memory and ability to understand (cognition). Work and work Statistician. Reproductive health. Screening  You may have the following tests or measurements: Height, weight, and BMI. Blood pressure. Lipid and cholesterol levels. These may be checked every 5 years, or more frequently if you are over 63 years old. Skin check. Lung cancer screening. You may have this screening every year starting at age 4 if you have a 30-pack-year history of smoking and currently smoke or have quit within the past 15 years. Fecal occult blood test (FOBT) of the stool. You may have this test every year starting at age 20. Flexible sigmoidoscopy or colonoscopy. You may have a sigmoidoscopy every 5 years or a colonoscopy every 10 years starting at age 75. Hepatitis C blood test. Hepatitis B blood test. Sexually transmitted disease (STD) testing. Diabetes screening. This is done by checking your blood sugar (glucose) after you have not eaten for a while (fasting). You may have this done every 1-3 years. Bone density scan. This is done to screen for osteoporosis. You may have this done starting at age 55. Mammogram. This may be done every 1-2 years. Talk to your health care provider about how often you should have regular mammograms. Talk with your health care provider about your test results, treatment options, and if necessary, the need for more tests. Vaccines  Your health care provider may recommend certain vaccines, such as: Influenza vaccine. This is recommended every year. Tetanus, diphtheria, and acellular pertussis (Tdap, Td) vaccine. You may need a Td booster every 10 years. Zoster vaccine. You may need this after age 10. Pneumococcal  13-valent conjugate (PCV13) vaccine. One  dose is recommended after age 22. Pneumococcal polysaccharide (PPSV23) vaccine. One dose is recommended after age 90. Talk to your health care provider about which screenings and vaccines you need and how often you need them. This information is not intended to replace advice given to you by your health care provider. Make sure you discuss any questions you have with your health care provider. Document Released: 02/26/2015 Document Revised: 10/20/2015 Document Reviewed: 12/01/2014 Elsevier Interactive Patient Education  2017 [place] Prevention in the Home Falls can cause injuries. They can happen to people of all ages. There are many things you can do to make your home safe and to help prevent falls. What can I do on the outside of my home? Regularly fix the edges of walkways and driveways and fix any cracks. Remove anything that might make you trip as you walk through a door, such as a raised step or threshold. Trim any bushes or trees on the path to your home. Use bright outdoor lighting. Clear any walking paths of anything that might make someone trip, such as rocks or tools. Regularly check to see if handrails are loose or broken. Make sure that both sides of any steps have handrails. Any raised decks and porches should have guardrails on the edges. Have any leaves, snow, or ice cleared regularly. Use sand or salt on walking paths during winter. Clean up any spills in your garage right away. This includes oil or grease spills. What can I do in the bathroom? Use night lights. Install grab bars by the toilet and in the tub and shower. Do not use towel bars as grab bars. Use non-skid mats or decals in the tub or shower. If you need to sit down in the shower, use a plastic, non-slip stool. Keep the floor dry. Clean up any water that spills on the floor as soon as it happens. Remove soap buildup in the tub or shower regularly. Attach bath mats securely with double-sided  non-slip rug tape. Do not have throw rugs and other things on the floor that can make you trip. What can I do in the bedroom? Use night lights. Make sure that you have a light by your bed that is easy to reach. Do not use any sheets or blankets that are too big for your bed. They should not hang down onto the floor. Have a firm chair that has side arms. You can use this for support while you get dressed. Do not have throw rugs and other things on the floor that can make you trip. What can I do in the kitchen? Clean up any spills right away. Avoid walking on wet floors. Keep items that you use a lot in easy-to-reach places. If you need to reach something above you, use a strong step stool that has a grab bar. Keep electrical cords out of the way. Do not use floor polish or wax that makes floors slippery. If you must use wax, use non-skid floor wax. Do not have throw rugs and other things on the floor that can make you trip. What can I do with my stairs? Do not leave any items on the stairs. Make sure that there are handrails on both sides of the stairs and use them. Fix handrails that are broken or loose. Make sure that handrails are as long as the stairways. Check any carpeting to make sure that it is firmly attached to the stairs. Fix any carpet  that is loose or worn. Avoid having throw rugs at the top or bottom of the stairs. If you do have throw rugs, attach them to the floor with carpet tape. Make sure that you have a light switch at the top of the stairs and the bottom of the stairs. If you do not have them, ask someone to add them for you. What else can I do to help prevent falls? Wear shoes that: Do not have high heels. Have rubber bottoms. Are comfortable and fit you well. Are closed at the toe. Do not wear sandals. If you use a stepladder: Make sure that it is fully opened. Do not climb a closed stepladder. Make sure that both sides of the stepladder are locked into place. Ask  someone to hold it for you, if possible. Clearly mark and make sure that you can see: Any grab bars or handrails. First and last steps. Where the edge of each step is. Use tools that help you move around (mobility aids) if they are needed. These include: Canes. Walkers. Scooters. Crutches. Turn on the lights when you go into a dark area. Replace any light bulbs as soon as they burn out. Set up your furniture so you have a clear path. Avoid moving your furniture around. If any of your floors are uneven, fix them. If there are any pets around you, be aware of where they are. Review your medicines with your doctor. Some medicines can make you feel dizzy. This can increase your chance of falling. Ask your doctor what other things that you can do to help prevent falls. This information is not intended to replace advice given to you by your health care provider. Make sure you discuss any questions you have with your health care provider. Document Released: 11/26/2008 Document Revised: 07/08/2015 Document Reviewed: 03/06/2014 Elsevier Interactive Patient Education  2017 [employer].

## 2021-03-03 NOTE — Progress Notes (Signed)
Subjective:   [name] is a 76 y.o. female who presents for Medicare Annual (Subsequent) preventive examination.  Virtual Visit via Telephone Note  I connected with  [name] on 03/03/21 at 11:20 AM EST by telephone and verified that I am speaking with the correct person using two identifiers.  Location: Patient: home Provider: [place] Persons participating in the virtual visit: [place]   I discussed the limitations, risks, security and privacy concerns of performing an evaluation and management service by telephone and the availability of in person appointments. The patient expressed understanding and agreed to proceed.  Interactive audio and video telecommunications were attempted between this nurse and patient, however failed, due to patient having technical difficulties OR patient did not have access to video capability.  We continued and completed visit with audio only.  Some vital signs may be absent or patient reported.   Clemetine Marker, LPN   Review of Systems     Cardiac Risk Factors include: advanced age (>16men, >28 women);dyslipidemia     Objective:    There were no vitals filed for this visit. There is no height or weight on file to calculate BMI.  Advanced Directives 03/03/2021 03/02/2020 10/08/2018 09/27/2018 09/21/2017 12/04/2016 05/31/2016  Does Patient Have a Medical Advance Directive? Yes Yes Yes Yes Yes Yes No  Type of Paramedic of [place];Living will [place];Living will - [place];Living will [place];Living will [place];Living will -  Does patient want to make changes to medical advance directive? - - - No - Patient declined - - -  Copy of [place] in Chart? Yes - validated most recent copy scanned in chart (See row information) Yes - validated most recent copy scanned in chart (See row information) - Yes -  validated most recent copy scanned in chart (See row information) No - copy requested - -  Would patient like information on creating a medical advance directive? - - - - - - -    Current Medications (verified) Outpatient Encounter Medications as of 03/03/2021  Medication Sig   ASPIRIN PO Take by mouth as needed.   calcium carbonate (OS-CAL - DOSED IN MG OF ELEMENTAL CALCIUM) 1250 (500 Ca) MG tablet Take 1 tablet by mouth daily with breakfast.   escitalopram (LEXAPRO) 10 MG tablet Take 1.5 tablets (15 mg total) by mouth daily.   famotidine (PEPCID) 10 MG tablet Take 1 tablet (10 mg total) by mouth 2 (two) times daily as needed for heartburn or indigestion.   GLUCOSAMINE-CHONDROIT-CALCIUM PO Take 1,000 mg by mouth daily.   hydrOXYzine (ATARAX/VISTARIL) 25 MG tablet TAKE 1-2 TABLETS (25-50 MG TOTAL) BY MOUTH AT BEDTIME AS NEEDED FOR ANXIETY (INSOMNIA).   lovastatin (MEVACOR) 20 MG tablet TAKE 1 TABLET BY MOUTH EVERYDAY AT BEDTIME   Multiple Vitamin (MULTIVITAMIN) tablet Take 1 tablet by mouth daily.   vitamin B-12 (CYANOCOBALAMIN) 1000 MCG tablet Take 1,000 mcg by mouth daily.   metoprolol tartrate (LOPRESSOR) 25 MG tablet Take 0.5 tablets (12.5 mg total) by mouth 2 (two) times daily as needed (palpitations).   No facility-administered encounter medications on file as of 03/03/2021.    Allergies (verified) Tetanus toxoids, Bromsulphalein, and Sulfa antibiotics   History: Past Medical History:  Diagnosis Date   Allergy    Arthritis    Asthma    Atypical chest pain 03/14/2019   Atypical mole 06/12/2018   right prox lat thigh/mod  Atypical mole 05/19/2014   left sup med buttock/excision   Atypical mole 03/26/2014   right sup lat buttock   Atypical mole 03/26/2013   left lat mid back   Atypical mole 11/08/2006   right shoulder superior/excision   Basal cell carcinoma 06/12/2016   right medial sup canthus below eyebrow   Basal cell carcinoma 09/23/2014   left nasal bridge   Basal  cell carcinoma 03/26/2013   left nasal sidewall   Basal cell carcinoma 08/27/2012   left nasal dorsum   Cancer (HCC)    shoulder, nose skin CA- basal cell CA   GERD (gastroesophageal reflux disease)    occasional   Hyperlipidemia    Osteopenia    Polyp of transverse colon    Right arm weakness 03/14/2019   Stress due to illness of family member 05/29/2018   Past Surgical History:  Procedure Laterality Date   BREAST BIOPSY Right    core bx- neg   COLONOSCOPY     COLONOSCOPY WITH PROPOFOL N/A 10/08/2018   Procedure: COLONOSCOPY WITH PROPOFOL;  Surgeon: Lucilla Lame, MD;  Location: [facility] ENDOSCOPY;  Service: Endoscopy;  Laterality: N/A;   MOHS SURGERY     TONSILLECTOMY     VAGINAL HYSTERECTOMY     Family History  Problem Relation Age of Onset   Colon cancer Paternal Uncle    Rectal cancer Paternal Uncle    Cancer Mother        lung    Diabetes Father    Parkinson's disease Father    Alzheimer's disease Father    Heart attack Father 71   Alcohol abuse Sister    Heart disease Sister    Lung cancer Sister    Cancer Son        livcolon, liver stage 4   Liver cancer Son    Stroke Paternal Grandmother    Cancer Paternal Grandfather        lung   Down syndrome Sister    Esophageal cancer Neg Hx    Stomach cancer Neg Hx    Breast cancer Neg Hx    Social History   Socioeconomic History   Marital status: Married    Spouse name: [name]   Number of children: 2   Years of education: Not on file   Highest education level: Bachelor's degree (e.g., BA, AB, BS)  Occupational History   Occupation: Retired  Tobacco Use   Smoking status: Former    Packs/day: 1.50    Years: 20.00    Pack years: 30.00    Types: Cigarettes    Quit date: 1993    Years since quitting: 30.0   Smokeless tobacco: Never  Vaping Use   Vaping Use: Never used  Substance and Sexual Activity   Alcohol use: Yes    Alcohol/week: 1.0 standard drink    Types: 1 Glasses of wine per week    Comment: one  glass of wine once a month   Drug use: Never   Sexual activity: Not Currently  Other Topics Concern   Not on file  Social History Narrative   Not on file   Social Determinants of Health   Financial Resource Strain: Low Risk    Difficulty of Paying Living Expenses: Not hard at all  Food Insecurity: No Food Insecurity   Worried About Charity fundraiser in the Last Year: Never true   Ran Out of Food in the Last Year: Never true  Transportation Needs: No Transportation Needs   Lack  of Transportation (Medical): No   Lack of Transportation (Non-Medical): No  Physical Activity: Sufficiently Active   Days of Exercise per Week: 7 days   Minutes of Exercise per Session: 30 min  Stress: No Stress Concern Present   Feeling of Stress : Not at all  Social Connections: Moderately Integrated   Frequency of Communication with Friends and Family: More than three times a week   Frequency of Social Gatherings with Friends and Family: Three times a week   Attends Religious Services: More than 4 times per year   Active Member of Clubs or Organizations: No   Attends Archivist Meetings: Never   Marital Status: Married    Tobacco Counseling Counseling given: Not Answered   Clinical Intake:  Pre-visit preparation completed: Yes  Pain : No/denies pain     Nutritional Risks: None Diabetes: No  How often do you need to have someone help you when you read instructions, pamphlets, or other written materials from your doctor or pharmacy?: 1 - Never    Interpreter Needed?: No  Information entered by :: Clemetine Marker LPN   Activities of Daily Living In your present state of health, do you have any difficulty performing the following activities: 03/03/2021 08/04/2020  Hearing? N N  Vision? N N  Difficulty concentrating or making decisions? N N  Walking or climbing stairs? N N  Dressing or bathing? N N  Doing errands, shopping? N N  Preparing Food and eating ? N -  Using the  Toilet? N -  In the past six months, have you accidently leaked urine? N -  Do you have problems with loss of bowel control? N -  Managing your Medications? N -  Managing your Finances? N -  Housekeeping or managing your Housekeeping? N -  Some recent data might be hidden    Patient Care Team: [name], PA-C as PCP - General (Family Medicine) End, Harrell Gave, MD as PCP - Cardiology (Cardiology) Duncan, [place], [place] as Social Worker Ralene Bathe, MD (Dermatology) Arelia Sneddon, [place] (Optometry) Vladimir Crofts, MD as Consulting Physician (Neurology) Beverly Gust, MD (Otolaryngology)  Indicate any recent Medical Services you may have received from other than [facility] providers in the past year (date may be approximate).     Assessment:   This is a routine wellness examination for [place].  Hearing/Vision screen Hearing Screening - Comments:: Pt denies hearing difficulty Vision Screening - Comments:: Annual vision screenings [facility] by [facility]nam[facility] at [facility]  Dietary issues and exercise activities discussed: Current Exercise Habits: Home exercise routine, Type of exercise: walking, Time (Minutes): 30, Frequency (Times/Week): 7, Weekly Exercise (Minutes/Week): 210, Intensity: Moderate, Exercise limited by: None identified   Goals Addressed             This Visit's Progress    DIET - INCREASE WATER INTAKE   On track    Recommend to drink at least 6-8 8oz glasses of water per day.       Depression Screen PHQ 2/9 Scores 03/03/2021 08/04/2020 06/09/2020 03/02/2020 12/03/2019 12/03/2019 05/29/2019  PHQ - 2 Score 0 0 0 2 2 2 2   PHQ- 9 Score - 0 6 7 8  - 9    Fall Risk Fall Risk  03/03/2021 08/04/2020 06/09/2020 03/02/2020 12/03/2019  Falls in the past year? 0 0 1 1 1   Comment - - - - -  Number falls in past yr: 0 0 1 1 1   Injury with Fall? 0 0 0  0 0  Risk for fall due to : No Fall Risks - - History of fall(s) History of fall(s)  Risk for fall due to: Comment  - - - - -  Follow up Falls prevention discussed Falls evaluation completed - Falls prevention discussed -    FALL RISK PREVENTION PERTAINING TO THE HOME:  Any stairs in or around the home? Yes  If so, are there any without handrails? No  Home free of loose throw rugs in walkways, pet beds, electrical cords, etc? Yes  Adequate lighting in your home to reduce risk of falls? Yes   ASSISTIVE DEVICES UTILIZED TO PREVENT FALLS:  Life alert? No  Use of a cane, walker or w/c? No  Grab bars in the bathroom? Yes  Shower chair or bench in shower? No  Elevated toilet seat or a handicapped toilet? Yes   TIMED UP AND GO:  Was the test performed? No . Telephonic visit.   Cognitive Function: Normal cognitive status assessed by direct observation by this Nurse Health Advisor. No abnormalities found.       6CIT Screen 09/27/2018 09/21/2017  What Year? 0 points 0 points  What month? 0 points 0 points  What time? 0 points 0 points  Count back from 20 0 points 0 points  Months in reverse 0 points 0 points  Repeat phrase 0 points 0 points  Total Score 0 0    Immunizations Immunization History  Administered Date(s) Administered   Fluad Quad(high Dose 65+) 11/18/2019   Influenza, High Dose Seasonal PF 11/03/2016, 10/30/2017, 10/17/2018, 11/10/2020   Influenza-Unspecified 10/14/2013, 11/15/2015, 11/03/2016   Moderna Covid-19 Vaccine Bivalent Booster 25yrs & up 11/22/2020   PFIZER(Purple Top)SARS-COV-2 Vaccination 03/28/2019, 04/18/2019, 11/24/2019   Pneumococcal Conjugate-13 12/09/2014, 05/31/2016   Pneumococcal Polysaccharide-23 09/21/2017   Zoster Recombinat (Shingrix) 06/15/2020    Tdap status: n/a due to allergy  Flu Vaccine status: Up to date  Pneumococcal vaccine status: Up to date  Covid-19 vaccine status: Completed vaccines  Qualifies for Shingles Vaccine? Yes   Zostavax completed Yes   Shingrix Completed?: Yes  Screening Tests Health Maintenance  Topic Date Due   Zoster  Vaccines- Shingrix (2 of 2) 08/10/2020   MAMMOGRAM  06/02/2021   COLONOSCOPY (Pts 45-64yrs Insurance coverage will need to be confirmed)  10/07/2021   Pneumonia Vaccine 38+ Years old  Completed   INFLUENZA VACCINE  Completed   DEXA SCAN  Completed   COVID-19 Vaccine  Completed   Hepatitis C Screening  Completed   HPV VACCINES  Aged Out   TETANUS/TDAP  Discontinued    Health Maintenance  Health Maintenance Due  Topic Date Due   Zoster Vaccines- Shingrix (2 of 2) 08/10/2020    Colorectal cancer screening: Type of screening: Colonoscopy. Completed 10/08/18. Repeat every 3 years  Mammogram status: Completed 06/02/20. Repeat every year. Ordered today.  Bone Density status: Completed 08/14/18. Results reflect: Bone density results: OSTEOPENIA. Repeat every 2 years. Ordered today.   Lung Cancer Screening: (Low Dose CT Chest recommended if Age 49-80 years, 30 pack-year currently smoking OR have quit w/in 15years.) does not qualify.   Additional Screening:  Hepatitis C Screening: does qualify; Completed 06/09/20  Vision Screening: Recommended annual ophthalmology exams for early detection of glaucoma and other disorders of the eye. Is the patient up to date with their annual eye exam?  Yes  Who is the provider or what is the name of the office in which the patient attends annual eye exams? [facility].  Dental Screening: Recommended annual dental exams for proper oral hygiene  Community Resource Referral / Chronic Care Management: CRR required this visit?  No   CCM required this visit?  No      Plan:     I have personally reviewed and noted the following in the patients chart:   Medical and social history Use of alcohol, tobacco or illicit drugs  Current medications and supplements including opioid prescriptions.  Functional ability and status Nutritional status Physical activity Advanced directives List of other physicians Hospitalizations, surgeries, and ER  visits in previous 12 months Vitals Screenings to include cognitive, depression, and falls Referrals and appointments  In addition, I have reviewed and discussed with patient certain preventive protocols, quality metrics, and best practice recommendations. A written personalized care plan for preventive services as well as general preventive health recommendations were provided to patient.    Due to this being a telephonic visit, the after visit summary with patients personalized plan was offered to patient via my-chart.   Clemetine Marker, LPN   [id number]   Nurse Notes: none

## 2021-04-08 ENCOUNTER — Encounter: Payer: Self-pay | Admitting: Family Medicine

## 2021-05-16 ENCOUNTER — Ambulatory Visit (INDEPENDENT_AMBULATORY_CARE_PROVIDER_SITE_OTHER): Payer: PPO | Admitting: Family Medicine

## 2021-05-16 ENCOUNTER — Telehealth: Payer: Self-pay | Admitting: Family Medicine

## 2021-05-16 ENCOUNTER — Encounter: Payer: Self-pay | Admitting: Family Medicine

## 2021-05-16 ENCOUNTER — Telehealth: Payer: Self-pay

## 2021-05-16 ENCOUNTER — Other Ambulatory Visit: Payer: Self-pay | Admitting: Family Medicine

## 2021-05-16 VITALS — BP 124/62 | HR 74 | Temp 97.9°F | Resp 16 | Ht 64.0 in

## 2021-05-16 DIAGNOSIS — Z1231 Encounter for screening mammogram for malignant neoplasm of breast: Secondary | ICD-10-CM

## 2021-05-16 DIAGNOSIS — Z5181 Encounter for therapeutic drug level monitoring: Secondary | ICD-10-CM

## 2021-05-16 DIAGNOSIS — K219 Gastro-esophageal reflux disease without esophagitis: Secondary | ICD-10-CM

## 2021-05-16 DIAGNOSIS — Z1211 Encounter for screening for malignant neoplasm of colon: Secondary | ICD-10-CM | POA: Diagnosis not present

## 2021-05-16 DIAGNOSIS — Z780 Asymptomatic menopausal state: Secondary | ICD-10-CM

## 2021-05-16 DIAGNOSIS — F320 Major depressive disorder, single episode, mild: Secondary | ICD-10-CM | POA: Diagnosis not present

## 2021-05-16 DIAGNOSIS — E782 Mixed hyperlipidemia: Secondary | ICD-10-CM

## 2021-05-16 DIAGNOSIS — F411 Generalized anxiety disorder: Secondary | ICD-10-CM | POA: Diagnosis not present

## 2021-05-16 DIAGNOSIS — R32 Unspecified urinary incontinence: Secondary | ICD-10-CM | POA: Diagnosis not present

## 2021-05-16 DIAGNOSIS — F439 Reaction to severe stress, unspecified: Secondary | ICD-10-CM | POA: Diagnosis not present

## 2021-05-16 DIAGNOSIS — N1831 Chronic kidney disease, stage 3a: Secondary | ICD-10-CM | POA: Diagnosis not present

## 2021-05-16 LAB — COMPLETE METABOLIC PANEL WITH GFR
AG Ratio: 2 (calc) (ref 1.0–2.5)
ALT: 14 U/L (ref 6–29)
AST: 16 U/L (ref 10–35)
Albumin: 4 g/dL (ref 3.6–5.1)
Alkaline phosphatase (APISO): 55 U/L (ref 37–153)
BUN: 10 mg/dL (ref 7–25)
CO2: 29 mmol/L (ref 20–32)
Calcium: 9.3 mg/dL (ref 8.6–10.4)
Chloride: 105 mmol/L (ref 98–110)
Creat: 0.95 mg/dL (ref 0.60–1.00)
Globulin: 2 g/dL (calc) (ref 1.9–3.7)
Glucose, Bld: 105 mg/dL — ABNORMAL HIGH (ref 65–99)
Potassium: 4.2 mmol/L (ref 3.5–5.3)
Sodium: 141 mmol/L (ref 135–146)
Total Bilirubin: 0.5 mg/dL (ref 0.2–1.2)
Total Protein: 6 g/dL — ABNORMAL LOW (ref 6.1–8.1)
eGFR: 62 mL/min/{1.73_m2} (ref >=60)

## 2021-05-16 LAB — LIPID PANEL
Cholesterol: 155 mg/dL (ref <200)
HDL: 46 mg/dL — ABNORMAL LOW (ref >=50)
LDL Cholesterol (Calc): 77 mg/dL (calc)
Non-HDL Cholesterol (Calc): 109 mg/dL (calc) (ref <130)
Total CHOL/HDL Ratio: 3.4 (calc) (ref <5.0)
Triglycerides: 218 mg/dL — ABNORMAL HIGH (ref <150)

## 2021-05-16 MED ORDER — ESCITALOPRAM OXALATE 20 MG PO TABS: 20.0000 mg | ORAL_TABLET | Freq: Every day | ORAL | 3 refills | Status: DC

## 2021-05-16 NOTE — Patient Instructions (Signed)
[place] at [facility] ?[place] ?[place],  [place]  [address] ?Get Driving Directions ?Main: [phone] ? ? ? ?

## 2021-05-16 NOTE — Progress Notes (Addendum)
Name: [name]   MRN: [id number]    DOB: 09/24/1945   Date:05/16/2021       Progress Note  Chief Complaint  Patient presents with   Follow-up    Pt refused to get weighted.   Anxiety   Hyperlipidemia   Gastroesophageal Reflux     Subjective:   [name] is a 76 y.o. female, presents to clinic for routine f/up  Anxiety and depression sx worse than previously  Says [name] can't shut off her mind - lexapro 15 mg daily doesn't seem to be helping like it used too Associated insomnia - uses hydroxyzine at bedtime for this Caring for husband, son with liver cancer, her dogs died right before the holidays    05/16/2021    8:10 AM 03/03/2021   11:27 AM 08/04/2020    8:02 AM  Depression screen PHQ 2/9  Decreased Interest 1 0 0  Down, Depressed, Hopeless 2 0 0  PHQ - 2 Score 3 0 0  Altered sleeping 3  0  Tired, decreased energy 1  0  Change in appetite 3  0  Feeling bad or failure about yourself  2  0  Trouble concentrating 1  0  Moving slowly or fidgety/restless 0  0  Suicidal thoughts 0  0  PHQ-9 Score 13  0  Difficult doing work/chores Not difficult at all  Not difficult at all   Urinary urgency and urge incontinence - wearing depends - hasn't seen anyone for this before, seems to be worse  SVT Currently managed on metoprolol prn for palpitations, no recent sx   HLD: On lovastatin, good compliance, no SE or concerns Lab Results  Component Value Date   CHOL 174 11/17/2019   HDL 49 (L) 11/17/2019   LDLCALC 90 11/17/2019   TRIG 269 (H) 11/17/2019   CHOLHDL 3.6 11/17/2019         Current Outpatient Medications:    ASPIRIN PO, Take by mouth as needed., Disp: , Rfl:    calcium carbonate (OS-CAL - DOSED IN MG OF ELEMENTAL CALCIUM) 1250 (500 Ca) MG tablet, Take 1 tablet by mouth daily with breakfast., Disp: , Rfl:    famotidine (PEPCID) 10 MG tablet, Take 1 tablet (10 mg total) by mouth 2 (two) times daily as needed for heartburn or indigestion., Disp: , Rfl:     GLUCOSAMINE-CHONDROIT-CALCIUM PO, Take 1,000 mg by mouth daily., Disp: , Rfl:    hydrOXYzine (ATARAX/VISTARIL) 25 MG tablet, TAKE 1-2 TABLETS (25-50 MG TOTAL) BY MOUTH AT BEDTIME AS NEEDED FOR ANXIETY (INSOMNIA)., Disp: 180 tablet, Rfl: 2   lovastatin (MEVACOR) 20 MG tablet, TAKE 1 TABLET BY MOUTH EVERYDAY AT BEDTIME, Disp: 90 tablet, Rfl: 3   vitamin B-12 (CYANOCOBALAMIN) 1000 MCG tablet, Take 1,000 mcg by mouth daily., Disp: , Rfl:    escitalopram (LEXAPRO) 20 MG tablet, Take 1 tablet (20 mg total) by mouth daily., Disp: 90 tablet, Rfl: 3   metoprolol tartrate (LOPRESSOR) 25 MG tablet, Take 0.5 tablets (12.5 mg total) by mouth 2 (two) times daily as needed (palpitations)., Disp: 30 tablet, Rfl: 6   Multiple Vitamin (MULTIVITAMIN) tablet, Take 1 tablet by mouth daily. (Patient not taking: Reported on 05/16/2021), Disp: , Rfl:   Patient Active Problem List   Diagnosis Date Noted   PSVT (paroxysmal supraventricular tachycardia) (HCC) 08/14/2019   Vitamin D deficiency 06/10/2019   Current mild episode of major depressive disorder without prior episode (HCC) 10/15/2018   GAD (generalized anxiety disorder) 10/15/2018  Personal history of colonic polyps    Obesity (BMI 30.0-34.9) 05/29/2018   GERD (gastroesophageal reflux disease) 11/27/2017   Osteopenia 11/27/2017   Mixed hyperlipidemia 12/09/2014   Insomnia due to stress 12/09/2014    Past Surgical History:  Procedure Laterality Date   BREAST BIOPSY Right    core bx- neg   COLONOSCOPY     COLONOSCOPY WITH PROPOFOL N/A 10/08/2018   Procedure: COLONOSCOPY WITH PROPOFOL;  Surgeon: Midge Minium, MD;  Location[facility] ENDOSCOPY;  Service: Endoscopy;  Laterality: N/A;   MOHS SURGERY     TONSILLECTOMY     VAGINAL HYSTERECTOMY      Family History  Problem Relation Age of Onset   Colon cancer Paternal Uncle    Rectal cancer Paternal Uncle    Cancer Mother        lung    Diabetes Father    Parkinson's disease Father    Alzheimer's disease  Father    Heart attack Father 6   Alcohol abuse Sister    Heart disease Sister    Lung cancer Sister    Cancer Son        livcolon, liver stage 4   Liver cancer Son    Stroke Paternal Grandmother    Cancer Paternal Grandfather        lung   Down syndrome Sister    Esophageal cancer Neg Hx    Stomach cancer Neg Hx    Breast cancer Neg Hx     Social History   Tobacco Use   Smoking status: Former    Packs/day: 1.50    Years: 20.00    Pack years: 30.00    Types: Cigarettes    Quit date: 1993    Years since quitting: 30.2   Smokeless tobacco: Never  Vaping Use   Vaping Use: Never used  Substance Use Topics   Alcohol use: Yes    Alcohol/week: 1.0 standard drink    Types: 1 Glasses of wine per week    Comment: one glass of wine once a month   Drug use: Never     Allergies  Allergen Reactions   Tetanus Toxoids Shortness Of Breath   Bromsulphalein    Sulfa Antibiotics     Severe vomiting    Health Maintenance  Topic Date Due   Zoster Vaccines- Shingrix (2 of 2) 08/10/2020   MAMMOGRAM  06/02/2021   INFLUENZA VACCINE  09/13/2021   COLONOSCOPY (Pts 45-54yrs Insurance coverage will need to be confirmed)  10/07/2021   Pneumonia Vaccine 20+ Years old  Completed   DEXA SCAN  Completed   COVID-19 Vaccine  Completed   Hepatitis C Screening  Completed   HPV VACCINES  Aged Out   TETANUS/TDAP  Discontinued    Chart Review Today: I personally reviewed active problem list, medication list, allergies, family history, social history, health maintenance, notes from last encounter, lab results, imaging with the patient/caregiver today.   Review of Systems  Constitutional: Negative.   HENT: Negative.    Eyes: Negative.   Respiratory: Negative.    Cardiovascular: Negative.   Gastrointestinal: Negative.   Endocrine: Negative.   Genitourinary: Negative.   Musculoskeletal: Negative.   Skin: Negative.   Allergic/Immunologic: Negative.   Neurological: Negative.    Hematological: Negative.   Psychiatric/Behavioral: Negative.    All other systems reviewed and are negative.   Objective:   Vitals:   05/16/21 0815  BP: 124/62  Pulse: 74  Resp: 16  Temp: 97.9 F (36.6 C)  TempSrc: Oral  SpO2: 95%  Height: 5\' 4"  (1.626 m)    Body mass index is 30.98 kg/m.  Physical Exam Vitals and nursing note reviewed.  Constitutional:      General: [name] is not in acute distress.    Appearance: Normal appearance. [name] is well-developed and well-groomed. [name] is not ill-appearing, toxic-appearing or diaphoretic.  HENT:     Head: Normocephalic and atraumatic.     Right Ear: External ear normal.     Left Ear: External ear normal.     Nose: Nose normal.  Eyes:     General:        Right eye: No discharge.        Left eye: No discharge.     Conjunctiva/sclera: Conjunctivae normal.  Neck:     Trachea: No tracheal deviation.  Cardiovascular:     Rate and Rhythm: Normal rate and regular rhythm.     Pulses: Normal pulses.     Heart sounds: Normal heart sounds. No murmur heard.   No friction rub. No gallop.  Pulmonary:     Effort: Pulmonary effort is normal. No respiratory distress.     Breath sounds: Normal breath sounds. No stridor. No wheezing, rhonchi or rales.  Abdominal:     General: Bowel sounds are normal.  Musculoskeletal:        General: Normal range of motion.  Skin:    General: Skin is warm and dry.     Findings: No rash.  Neurological:     Mental Status: [name] is alert. Mental status is at baseline.     Motor: No abnormal muscle tone.     Coordination: Coordination normal.  Psychiatric:        Mood and Affect: Mood normal.        Behavior: Behavior normal. Behavior is cooperative.        Assessment & Plan:     ICD-10-CM   1. Mixed hyperlipidemia  E78.2    on lovastatin, CMP and Lipids done today, previously ordered    2. Gastroesophageal reflux disease without esophagitis  K21.9    sx well controlled with diet/lifestyle and  pepcid prn    3. GAD (generalized anxiety disorder)  F41.1    positive PHQ and gad 7 - increase lexapro dose, close f/up, encouraged continued caregiver support/therapy, exercise     4. Situational stress  F43.9    increase lexapro dose, consider buspar trial?  close f/up    5. Current mild episode of major depressive disorder without prior episode (HCC) Chronic F32.0 escitalopram (LEXAPRO) 20 MG tablet   score increased, discussed today, [name] feels mostly anxious    6. Urinary incontinence, unspecified type  R32 Ambulatory referral to Urogynecology    CANCELED: Ambulatory referral to Urogynecology   leaking when trying to get to the restroom, sig sx all the sudden, wearing depends    7. Encounter for medication monitoring  Z51.81     8. Postmenopausal estrogen deficiency  Z78.0 DG Bone Density    9. Encounter for screening mammogram for malignant neoplasm of breast  Z12.31 MM 3D SCREEN BREAST BILATERAL    10. Screening for malignant neoplasm of colon  Z12.11 Ambulatory referral to Gastroenterology     3 month f/up on med dose change - encourage pt to reach out sooner if increased lexapro is not helpful    No follow-ups on file.   Danelle Berry, PA-C 05/16/21 8:51 AM

## 2021-05-16 NOTE — Telephone Encounter (Signed)
Patient will call us back

## 2021-05-16 NOTE — Addendum Note (Signed)
Addended by: [name] on: 05/16/2021 10:39 AM ? ? Modules accepted: Orders, Level of Service ? ?

## 2021-05-16 NOTE — Telephone Encounter (Signed)
Pt is calling to request if Bone Density Orders can be placed. Please advise CB [phone] ?

## 2021-05-16 NOTE — Telephone Encounter (Signed)
Patient is ready to schedule colonoscopy

## 2021-05-16 NOTE — Telephone Encounter (Signed)
Pt has been notified.

## 2021-05-17 ENCOUNTER — Telehealth: Payer: Self-pay

## 2021-05-17 ENCOUNTER — Other Ambulatory Visit: Payer: Self-pay

## 2021-05-17 DIAGNOSIS — Z86010 Personal history of colonic polyps: Secondary | ICD-10-CM

## 2021-05-17 MED ORDER — NA SULFATE-K SULFATE-MG SULF 17.5-3.13-1.6 GM/177ML PO SOLN: 1.0000 | Freq: Once | ORAL | 0 refills | Status: AC

## 2021-05-17 NOTE — Progress Notes (Signed)
Gastroenterology Pre-Procedure Review ? ?Request Date: 08/05/2021 ?Requesting Physician: Dr. Allen Norris ? ?PATIENT REVIEW QUESTIONS: The patient responded to the following health history questions as indicated:   ? ?1. Are you having any GI issues? no ?2. Do you have a personal history of Polyps? yes (LAST COLONOSCOPY ) ?3. Do you have a family history of Colon Cancer or Polyps? yes (POLYPS AND COLON CANCER ) ?4. Diabetes Mellitus? no ?5. Joint replacements in the past 12 months?no ?6. Major health problems in the past 3 months?no ?7. Any artificial heart valves, MVP, or defibrillator?no ?   ?MEDICATIONS & ALLERGIES:    ?Patient reports the following regarding taking any anticoagulation/antiplatelet therapy:   ?Plavix, Coumadin, Eliquis, Xarelto, Lovenox, Pradaxa, Brilinta, or Effient? no ?Aspirin? no ? ?Patient confirms/reports the following medications:  ?Current Outpatient Medications  ?Medication Sig Dispense Refill  ? ASPIRIN PO Take by mouth as needed.    ? calcium carbonate (OS-CAL - DOSED IN MG OF ELEMENTAL CALCIUM) 1250 (500 Ca) MG tablet Take 1 tablet by mouth daily with breakfast.    ? escitalopram (LEXAPRO) 20 MG tablet Take 1 tablet (20 mg total) by mouth daily. 90 tablet 3  ? famotidine (PEPCID) 10 MG tablet Take 1 tablet (10 mg total) by mouth 2 (two) times daily as needed for heartburn or indigestion.    ? GLUCOSAMINE-CHONDROIT-CALCIUM PO Take 1,000 mg by mouth daily.    ? hydrOXYzine (ATARAX/VISTARIL) 25 MG tablet TAKE 1-2 TABLETS (25-50 MG TOTAL) BY MOUTH AT BEDTIME AS NEEDED FOR ANXIETY (INSOMNIA). 180 tablet 2  ? lovastatin (MEVACOR) 20 MG tablet TAKE 1 TABLET BY MOUTH EVERYDAY AT BEDTIME 90 tablet 3  ? metoprolol tartrate (LOPRESSOR) 25 MG tablet Take 0.5 tablets (12.5 mg total) by mouth 2 (two) times daily as needed (palpitations). 30 tablet 6  ? Multiple Vitamin (MULTIVITAMIN) tablet Take 1 tablet by mouth daily. (Patient not taking: Reported on 05/16/2021)    ? vitamin B-12 (CYANOCOBALAMIN) 1000 MCG  tablet Take 1,000 mcg by mouth daily.    ? ?No current facility-administered medications for this visit.  ? ? ?Patient confirms/reports the following allergies:  ?Allergies  ?Allergen Reactions  ? Tetanus Toxoids Shortness Of Breath  ? Bromsulphalein   ? Sulfa Antibiotics   ?  Severe vomiting  ? ? ?No orders of the defined types were placed in this encounter. ? ? ?AUTHORIZATION INFORMATION ?Primary Insurance: ?1D#: ?Group #: ? ?Secondary Insurance: ?1D#: ?Group #: ? ?SCHEDULE INFORMATION: ?Date: 08/05/2021 ?Time: ?Location:[facility] ? ?

## 2021-05-17 NOTE — Telephone Encounter (Signed)
CALLED PATIENT NO ANSWER LEFT VOICEMAIL FOR A CALL BACK ? ?

## 2021-05-17 NOTE — Telephone Encounter (Signed)
PATIENT WILL CALL us WHEN SHE IS READY TO SCHEDULE PROCEDURE  ?

## 2021-05-17 NOTE — Telephone Encounter (Signed)
Pt returned call left message ?

## 2021-05-19 DIAGNOSIS — R9082 White matter disease, unspecified: Secondary | ICD-10-CM | POA: Diagnosis not present

## 2021-05-19 DIAGNOSIS — Z8669 Personal history of other diseases of the nervous system and sense organs: Secondary | ICD-10-CM | POA: Diagnosis not present

## 2021-05-19 DIAGNOSIS — R413 Other amnesia: Secondary | ICD-10-CM | POA: Diagnosis not present

## 2021-05-19 DIAGNOSIS — G4700 Insomnia, unspecified: Secondary | ICD-10-CM | POA: Diagnosis not present

## 2021-06-10 ENCOUNTER — Other Ambulatory Visit: Payer: Self-pay | Admitting: Family Medicine

## 2021-06-10 DIAGNOSIS — E782 Mixed hyperlipidemia: Secondary | ICD-10-CM

## 2021-06-15 ENCOUNTER — Ambulatory Visit (INDEPENDENT_AMBULATORY_CARE_PROVIDER_SITE_OTHER): Payer: PPO | Admitting: Family Medicine

## 2021-06-15 ENCOUNTER — Encounter: Payer: Self-pay | Admitting: Family Medicine

## 2021-06-15 VITALS — BP 138/68 | HR 78 | Resp 16 | Ht 64.0 in | Wt 180.0 lb

## 2021-06-15 DIAGNOSIS — R319 Hematuria, unspecified: Secondary | ICD-10-CM | POA: Diagnosis not present

## 2021-06-15 DIAGNOSIS — N390 Urinary tract infection, site not specified: Secondary | ICD-10-CM | POA: Diagnosis not present

## 2021-06-15 DIAGNOSIS — R300 Dysuria: Secondary | ICD-10-CM | POA: Diagnosis not present

## 2021-06-15 DIAGNOSIS — R350 Frequency of micturition: Secondary | ICD-10-CM | POA: Diagnosis not present

## 2021-06-15 LAB — POCT URINALYSIS DIPSTICK
Bilirubin, UA: NEGATIVE
Glucose, UA: NEGATIVE
Ketones, UA: NEGATIVE
Leukocytes, UA: NEGATIVE
Nitrite, UA: NEGATIVE
Protein, UA: NEGATIVE
Spec Grav, UA: 1.01 (ref 1.010–1.025)
Urobilinogen, UA: 0.2 E.U./dL
pH, UA: 5 (ref 5.0–8.0)

## 2021-06-15 MED ORDER — CEPHALEXIN 500 MG PO CAPS: 500.0000 mg | ORAL_CAPSULE | Freq: Three times a day (TID) | ORAL | 0 refills | Status: DC

## 2021-06-15 NOTE — Progress Notes (Signed)
? ? ?Patient ID: [name], female    DOB: Jan 20, 1946, 76 y.o.   MRN: [id number] ? ?PCP: [name], PA-C ? ?Chief Complaint  ?Patient presents with  ? Urinary Tract Infection  ?  Dysuria, urinary frequency. X3 days  ? ? ?Subjective:  ? ?[name] is a 76 y.o. female, presents to clinic with CC of the following: ? ?HPI  ?Patient presents with about 2 weeks of urinary symptoms she tried to manage them with over-the-counter supplements and Azo and symptoms resolved for short period of time and have returned much more severe these past 3 days including urinary frequency, very small amount of urine produced with the severe amount of pain and burning.  She denies any abdominal pain, nausea, vomiting, sweats, chills or flank pain she does have some mild low back pain ?No history of recurrent UTI last urine culture was over a year and a half ago which was negative ? ? ? ? ?Patient Active Problem List  ? Diagnosis Date Noted  ? PSVT (paroxysmal supraventricular tachycardia) ([place]) 08/14/2019  ? Vitamin D deficiency 06/10/2019  ? Current mild episode of major depressive disorder without prior episode ([place]) 10/15/2018  ? GAD (generalized anxiety disorder) 10/15/2018  ? Personal history of colonic polyps   ? Obesity (BMI 30.0-34.9) 05/29/2018  ? GERD (gastroesophageal reflux disease) 11/27/2017  ? Osteopenia 11/27/2017  ? Mixed hyperlipidemia 12/09/2014  ? Insomnia due to stress 12/09/2014  ? ? ? ? ?Current Outpatient Medications:  ?  ASPIRIN PO, Take by mouth as needed., Disp: , Rfl:  ?  calcium carbonate (OS-CAL - DOSED IN MG OF ELEMENTAL CALCIUM) 1250 (500 Ca) MG tablet, Take 1 tablet by mouth daily with breakfast., Disp: , Rfl:  ?  cephALEXin (KEFLEX) 500 MG capsule, Take 1 capsule (500 mg total) by mouth 3 (three) times daily for 5 days., Disp: 15 capsule, Rfl: 0 ?  escitalopram (LEXAPRO) 20 MG tablet, Take 1 tablet (20 mg total) by mouth daily., Disp: 90 tablet, Rfl: 3 ?  famotidine (PEPCID) 10 MG tablet, Take  1 tablet (10 mg total) by mouth 2 (two) times daily as needed for heartburn or indigestion., Disp: , Rfl:  ?  GLUCOSAMINE-CHONDROIT-CALCIUM PO, Take 1,000 mg by mouth daily., Disp: , Rfl:  ?  hydrOXYzine (ATARAX/VISTARIL) 25 MG tablet, TAKE 1-2 TABLETS (25-50 MG TOTAL) BY MOUTH AT BEDTIME AS NEEDED FOR ANXIETY (INSOMNIA)., Disp: 180 tablet, Rfl: 2 ?  lovastatin (MEVACOR) 20 MG tablet, TAKE 1 TABLET BY MOUTH EVERYDAY AT BEDTIME, Disp: 90 tablet, Rfl: 3 ?  vitamin B-12 (CYANOCOBALAMIN) 1000 MCG tablet, Take 1,000 mcg by mouth daily., Disp: , Rfl:  ?  metoprolol tartrate (LOPRESSOR) 25 MG tablet, Take 0.5 tablets (12.5 mg total) by mouth 2 (two) times daily as needed (palpitations)., Disp: 30 tablet, Rfl: 6 ? ? ?Allergies  ?Allergen Reactions  ? Tetanus Toxoids Shortness Of Breath  ? Bromsulphalein   ? Sulfa Antibiotics   ?  Severe vomiting  ? ? ? ?Social History  ? ?Tobacco Use  ? Smoking status: Former  ?  Packs/day: 1.50  ?  Years: 20.00  ?  Pack years: 30.00  ?  Types: Cigarettes  ?  Quit date: 27  ?  Years since quitting: 30.3  ? Smokeless tobacco: Never  ?Vaping Use  ? Vaping Use: Never used  ?Substance Use Topics  ? Alcohol use: Yes  ?  Alcohol/week: 1.0 standard drink  ?  Types: 1 Glasses of wine per week  ?  Comment: one glass of wine once a month  ? Drug use: Never  ?  ? ? ?Chart Review Today: ?I personally reviewed active problem list, medication list, allergies, family history, social history, health maintenance, notes from last encounter, lab results, imaging with the patient/caregiver today. ? ? ?Review of Systems  ?Constitutional: Negative.   ?HENT: Negative.    ?Eyes: Negative.   ?Respiratory: Negative.    ?Cardiovascular: Negative.   ?Gastrointestinal: Negative.   ?Endocrine: Negative.   ?Genitourinary: Negative.   ?Musculoskeletal: Negative.   ?Skin: Negative.   ?Allergic/Immunologic: Negative.   ?Neurological: Negative.   ?Hematological: Negative.   ?Psychiatric/Behavioral: Negative.    ?All other  systems reviewed and are negative. ? ?   ?Objective:  ? ?Vitals:  ? 06/15/21 1346  ?BP: 138/68  ?Pulse: 78  ?Resp: 16  ?SpO2: 95%  ?Weight: 180 lb (81.6 kg)  ?Height: '5\' 4"'$  (1.626 m)  ?  ?Body mass index is 30.9 kg/m?. ? ?Physical Exam ?Vitals and nursing note reviewed.  ?Constitutional:   ?   General: She is not in acute distress. ?   Appearance: Normal appearance. She is well-developed and well-groomed. She is obese. She is not ill-appearing, toxic-appearing or diaphoretic.  ?   Comments: Well-appearing  ?HENT:  ?   Head: Normocephalic and atraumatic.  ?   Right Ear: External ear normal.  ?   Left Ear: External ear normal.  ?Eyes:  ?   General:     ?   Right eye: No discharge.  ?   Conjunctiva/sclera: Conjunctivae normal.  ?Cardiovascular:  ?   Rate and Rhythm: Normal rate and regular rhythm.  ?   Pulses: Normal pulses.  ?   Heart sounds: Normal heart sounds. No murmur heard. ?  No friction rub. No gallop.  ?Pulmonary:  ?   Effort: Pulmonary effort is normal.  ?   Breath sounds: Normal breath sounds.  ?Abdominal:  ?   General: Bowel sounds are normal. There is no distension.  ?   Palpations: Abdomen is soft.  ?   Tenderness: There is no abdominal tenderness. There is no right CVA tenderness, left CVA tenderness, guarding or rebound.  ?Skin: ?   General: Skin is warm and dry.  ?   Capillary Refill: Capillary refill takes less than 2 seconds.  ?Neurological:  ?   Mental Status: She is alert. Mental status is at baseline.  ?Psychiatric:     ?   Mood and Affect: Mood normal.     ?   Behavior: Behavior normal. Behavior is cooperative.  ?  ? ?Results for orders placed or performed in visit on 06/15/21  ?POCT Urinalysis Dipstick  ?Result Value Ref Range  ? Color, UA Yellow   ? Clarity, UA Clear   ? Glucose, UA Negative Negative  ? Bilirubin, UA Negative   ? Ketones, UA Negative   ? Spec Grav, UA 1.010 1.010 - 1.025  ? Blood, UA Small   ? pH, UA 5.0 5.0 - 8.0  ? Protein, UA Negative Negative  ? Urobilinogen, UA 0.2 0.2 or  1.0 E.U./dL  ? Nitrite, UA Negative   ? Leukocytes, UA Negative Negative  ? Appearance    ? Odor    ? ? ?   ?Assessment & Plan:  ? ?  ICD-10-CM   ?1. Urinary tract infection with hematuria, site unspecified  N39.0 POCT Urinalysis Dipstick  ? R31.9 Urine Culture  ?  cephALEXin (KEFLEX) 500 MG capsule  ?  ? ?Patient  presents with about 2 weeks of urinary symptoms, they briefly got better with over-the-counter supplements and Azo but then returned more severe.  UA is pertinent for only trace blood and nitrites and leukocytes negative however her symptoms are consistent with a simple UTI/cystitis.  She is well-appearing, has no abdominal pain, CVA tenderness, vital signs stable, will treat empirically and follow culture ? ? ? ? ? ?[name], PA-C ?06/15/21 2:18 PM ? ?

## 2021-06-17 LAB — URINE CULTURE
MICRO NUMBER:: 13346909
SPECIMEN QUALITY:: ADEQUATE

## 2021-06-20 ENCOUNTER — Other Ambulatory Visit: Payer: Self-pay | Admitting: Family Medicine

## 2021-06-20 MED ORDER — NITROFURANTOIN MONOHYD MACRO 100 MG PO CAPS: 100.0000 mg | ORAL_CAPSULE | Freq: Two times a day (BID) | ORAL | 0 refills | Status: DC

## 2021-06-22 ENCOUNTER — Encounter: Payer: PPO | Admitting: Dermatology

## 2021-06-22 MED ORDER — NITROFURANTOIN MONOHYD MACRO 100 MG PO CAPS: 100.0000 mg | ORAL_CAPSULE | Freq: Two times a day (BID) | ORAL | 0 refills | Status: DC

## 2021-06-22 NOTE — Addendum Note (Signed)
Addended by: [name] on: 06/22/2021 05:19 PM ? ? Modules accepted: Orders ? ?

## 2021-06-28 ENCOUNTER — Encounter: Payer: Self-pay | Admitting: Family Medicine

## 2021-06-28 ENCOUNTER — Ambulatory Visit (INDEPENDENT_AMBULATORY_CARE_PROVIDER_SITE_OTHER): Payer: PPO | Admitting: Family Medicine

## 2021-06-28 VITALS — BP 126/68 | HR 73 | Resp 16 | Ht 64.0 in | Wt 178.0 lb

## 2021-06-28 DIAGNOSIS — N390 Urinary tract infection, site not specified: Secondary | ICD-10-CM

## 2021-06-28 DIAGNOSIS — R300 Dysuria: Secondary | ICD-10-CM | POA: Diagnosis not present

## 2021-06-28 DIAGNOSIS — M545 Low back pain, unspecified: Secondary | ICD-10-CM | POA: Diagnosis not present

## 2021-06-28 DIAGNOSIS — R350 Frequency of micturition: Secondary | ICD-10-CM | POA: Diagnosis not present

## 2021-06-28 LAB — POCT URINALYSIS DIPSTICK
Bilirubin, UA: NEGATIVE
Glucose, UA: NEGATIVE
Ketones, UA: NEGATIVE
Nitrite, UA: NEGATIVE
Protein, UA: NEGATIVE
Spec Grav, UA: 1.01 (ref 1.010–1.025)
Urobilinogen, UA: 0.2 E.U./dL
pH, UA: 6 (ref 5.0–8.0)

## 2021-06-28 MED ORDER — LEVOFLOXACIN 750 MG PO TABS: 750.0000 mg | ORAL_TABLET | Freq: Every day | ORAL | 0 refills | Status: DC

## 2021-06-28 NOTE — Progress Notes (Signed)
? ? ?Patient ID: [name], female    DOB: [birth year], 76 y.o.   MRN: [id number] ? ?PCP: [name], PA-C ? ?Chief Complaint  ?Patient presents with  ? Urinary Tract Infection  ?  Dysuria, Urinary frequency- started yesterday.  ? ? ?Subjective:  ? ?[name] is a 76 y.o. female, presents to clinic with CC of the following: ? ?HPI  ? ?UTI recurrent, culture was positive for e.coli with resistance to keflex and was switched to macrobid - she finished abx and then next day had recurrent urinary sx and now low back pain. ?Sx for the last 1-2 d has been urgency, frequency, low urine volume with dribbling and dysuria, low back pain ?Denies flank pain, N, V, confusion, balance disturbances, fever, chills sweats, appetite changes ? ?Patient Active Problem List  ? Diagnosis Date Noted  ? PSVT (paroxysmal supraventricular tachycardia) ([place]) 08/14/2019  ? Vitamin D deficiency 06/10/2019  ? Current mild episode of major depressive disorder without prior episode ([place]) 10/15/2018  ? GAD (generalized anxiety disorder) 10/15/2018  ? Personal history of colonic polyps   ? Obesity (BMI 30.0-34.9) 05/29/2018  ? GERD (gastroesophageal reflux disease) 11/27/2017  ? Osteopenia 11/27/2017  ? Mixed hyperlipidemia 12/09/2014  ? Insomnia due to stress 12/09/2014  ? ? ? ? ?Current Outpatient Medications:  ?  ASPIRIN PO, Take by mouth as needed., Disp: , Rfl:  ?  calcium carbonate (OS-CAL - DOSED IN MG OF ELEMENTAL CALCIUM) 1250 (500 Ca) MG tablet, Take 1 tablet by mouth daily with breakfast., Disp: , Rfl:  ?  escitalopram (LEXAPRO) 20 MG tablet, Take 1 tablet (20 mg total) by mouth daily., Disp: 90 tablet, Rfl: 3 ?  famotidine (PEPCID) 10 MG tablet, Take 1 tablet (10 mg total) by mouth 2 (two) times daily as needed for heartburn or indigestion., Disp: , Rfl:  ?  GLUCOSAMINE-CHONDROIT-CALCIUM PO, Take 1,000 mg by mouth daily., Disp: , Rfl:  ?  hydrOXYzine (ATARAX/VISTARIL) 25 MG tablet, TAKE 1-2 TABLETS (25-50 MG TOTAL) BY MOUTH AT  BEDTIME AS NEEDED FOR ANXIETY (INSOMNIA)., Disp: 180 tablet, Rfl: 2 ?  levofloxacin (LEVAQUIN) 750 MG tablet, Take 1 tablet (750 mg total) by mouth daily., Disp: 5 tablet, Rfl: 0 ?  lovastatin (MEVACOR) 20 MG tablet, TAKE 1 TABLET BY MOUTH EVERYDAY AT BEDTIME, Disp: 90 tablet, Rfl: 3 ?  vitamin B-12 (CYANOCOBALAMIN) 1000 MCG tablet, Take 1,000 mcg by mouth daily., Disp: , Rfl:  ?  metoprolol tartrate (LOPRESSOR) 25 MG tablet, Take 0.5 tablets (12.5 mg total) by mouth 2 (two) times daily as needed (palpitations)., Disp: 30 tablet, Rfl: 6 ? ? ?Allergies  ?Allergen Reactions  ? Tetanus Toxoids Shortness Of Breath  ? Bromsulphalein   ? Sulfa Antibiotics   ?  Severe vomiting  ? ? ? ?Social History  ? ?Tobacco Use  ? Smoking status: Former  ?  Packs/day: 1.50  ?  Years: 20.00  ?  Pack years: 30.00  ?  Types: Cigarettes  ?  Quit date: 64  ?  Years since quitting: 30.3  ? Smokeless tobacco: Never  ?Vaping Use  ? Vaping Use: Never used  ?Substance Use Topics  ? Alcohol use: Yes  ?  Alcohol/week: 1.0 standard drink  ?  Types: 1 Glasses of wine per week  ?  Comment: one glass of wine once a month  ? Drug use: Never  ?  ? ? ?Chart Review Today: ?I personally reviewed active problem list, medication list, allergies, family history, social  history, health maintenance, notes from last encounter, lab results, imaging with the patient/caregiver today. ? ? ?Review of Systems  ?Constitutional: Negative.   ?HENT: Negative.    ?Eyes: Negative.   ?Respiratory: Negative.    ?Cardiovascular: Negative.   ?Gastrointestinal: Negative.   ?Endocrine: Negative.   ?Genitourinary: Negative.   ?Musculoskeletal: Negative.   ?Skin: Negative.   ?Allergic/Immunologic: Negative.   ?Neurological: Negative.   ?Hematological: Negative.   ?Psychiatric/Behavioral: Negative.    ?All other systems reviewed and are negative. ? ?   ?Objective:  ? ?Vitals:  ? 06/28/21 1100  ?BP: 126/68  ?Pulse: 73  ?Resp: 16  ?SpO2: 94%  ?Weight: 178 lb (80.7 kg)  ?Height: 5'  4" (1.626 m)  ?  ?Body mass index is 30.55 kg/m?. ? ?Physical Exam ?Vitals and nursing note reviewed.  ?Constitutional:   ?   General: She is not in acute distress. ?   Appearance: She is well-developed and normal weight. She is not ill-appearing, toxic-appearing or diaphoretic.  ?HENT:  ?   Head: Normocephalic and atraumatic.  ?   Nose: Nose normal.  ?Eyes:  ?   General:     ?   Right eye: No discharge.     ?   Left eye: No discharge.  ?   Conjunctiva/sclera: Conjunctivae normal.  ?Neck:  ?   Trachea: No tracheal deviation.  ?Cardiovascular:  ?   Rate and Rhythm: Normal rate and regular rhythm.  ?   Pulses: Normal pulses.  ?   Heart sounds: Normal heart sounds.  ?Pulmonary:  ?   Effort: Pulmonary effort is normal. No respiratory distress.  ?   Breath sounds: Normal breath sounds. No stridor.  ?Abdominal:  ?   General: Bowel sounds are normal. There is no distension.  ?   Palpations: Abdomen is soft.  ?   Tenderness: There is no abdominal tenderness. There is no right CVA tenderness, left CVA tenderness, guarding or rebound.  ?Musculoskeletal:     ?   General: Normal range of motion.  ?Skin: ?   General: Skin is warm and dry.  ?   Findings: No rash.  ?Neurological:  ?   Mental Status: She is alert.  ?   Motor: No abnormal muscle tone.  ?   Coordination: Coordination normal.  ?Psychiatric:     ?   Behavior: Behavior normal.  ?  ? ?Results for orders placed or performed in visit on 06/28/21  ?POCT Urinalysis Dipstick  ?Result Value Ref Range  ? Color, UA Yellow   ? Clarity, UA Clear   ? Glucose, UA Negative Negative  ? Bilirubin, UA Negative   ? Ketones, UA Negative   ? Spec Grav, UA 1.010 1.010 - 1.025  ? Blood, UA Small +   ? pH, UA 6.0 5.0 - 8.0  ? Protein, UA Negative Negative  ? Urobilinogen, UA 0.2 0.2 or 1.0 E.U./dL  ? Nitrite, UA Negative   ? Leukocytes, UA Trace (A) Negative  ? Appearance    ? Odor    ? ? ?   ?Assessment & Plan:  ? ?  ICD-10-CM   ?1. Dysuria  R30.0 POCT Urinalysis Dipstick  ?  Urine Culture   ? recurrent sx immediately after finishing abx for last UTI with med change for micro/C&S  ?  ?2. Recurrent UTI  N39.0 levofloxacin (LEVAQUIN) 750 MG tablet  ? discussed abx choices, only able to do cipro, levaquin or macrobid - discussed BB warning, shared decision making for doing  tx with levaquin   ?  ?3. Acute bilateral low back pain without sciatica  M54.50 levofloxacin (LEVAQUIN) 750 MG tablet  ? no CVA tenderness on exam  ?  ? ?She has urology f/up in about 2 weeks  ?Will follow C&S ?Pt well appearing today, do not suspect pyelo - abx choices limited by allergies, recent tx, and recent C&S with resistance to multiple organisms ?New UTI sx this year, not in the past - has urology f/up ? ? ? ?[name], PA-C ?06/28/21 11:22 AM ? ?

## 2021-06-29 LAB — URINE CULTURE
MICRO NUMBER:: 13403368
SPECIMEN QUALITY:: ADEQUATE

## 2021-07-18 ENCOUNTER — Encounter: Payer: Self-pay | Admitting: Urology

## 2021-07-18 ENCOUNTER — Ambulatory Visit: Payer: PPO | Admitting: Urology

## 2021-07-18 VITALS — BP 131/74 | HR 69 | Ht 64.0 in | Wt 172.0 lb

## 2021-07-18 DIAGNOSIS — R32 Unspecified urinary incontinence: Secondary | ICD-10-CM

## 2021-07-18 DIAGNOSIS — N1330 Unspecified hydronephrosis: Secondary | ICD-10-CM

## 2021-07-18 DIAGNOSIS — N3946 Mixed incontinence: Secondary | ICD-10-CM

## 2021-07-18 LAB — URINALYSIS, COMPLETE
Bilirubin, UA: NEGATIVE
Glucose, UA: NEGATIVE
Ketones, UA: NEGATIVE
Leukocytes,UA: NEGATIVE
Nitrite, UA: NEGATIVE
Protein,UA: NEGATIVE
RBC, UA: NEGATIVE
Specific Gravity, UA: 1.02 (ref 1.005–1.030)
Urobilinogen, Ur: 0.2 mg/dL (ref 0.2–1.0)
pH, UA: 6 (ref 5.0–7.5)

## 2021-07-18 LAB — BLADDER SCAN AMB NON-IMAGING

## 2021-07-18 LAB — MICROSCOPIC EXAMINATION
Bacteria, UA: NONE SEEN
RBC, Urine: NONE SEEN /hpf (ref 0–2)

## 2021-07-18 MED ORDER — MIRABEGRON ER 50 MG PO TB24: 50.0000 mg | ORAL_TABLET | Freq: Every day | ORAL | 11 refills | Status: DC

## 2021-07-18 NOTE — Patient Instructions (Signed)

## 2021-07-18 NOTE — Progress Notes (Signed)
07/18/2021 9:32 AM   [name] 01-28-1946 [id number]  Referring provider: [name], PA-C [address] [place],  [place] [address]  Chief Complaint  Patient presents with   Urinary Incontinence    New Patient    HPI: I was consulted to assess the patient's recent bladder infection and her incontinence.  A few weeks ago she had burning chills and back pain and it took 3 antibiotics for the symptoms to normalize.  She has had 2 infections in the last year which is more than her usual  She has urge incontinence.  She leaks with coughing sneezing but not bending lifting.  Urgency is more significant.  She wears 2 pads a day.  At home they are dried a damp but they can be quite wet if she was out in public.  No bedwetting  She voids with a good flow.  She voids every 3 hours gets up least twice a night  Has had a hysterectomy  No history of kidney stones or bladder surgery.  No neurologic issues.  Bowel movements normal   PMH: Past Medical History:  Diagnosis Date   Allergy    Arthritis    Asthma    Atypical chest pain 03/14/2019   Atypical mole 06/12/2018   right prox lat thigh/mod   Atypical mole 05/19/2014   left sup med buttock/excision   Atypical mole 03/26/2014   right sup lat buttock   Atypical mole 03/26/2013   left lat mid back   Atypical mole 11/08/2006   right shoulder superior/excision   Basal cell carcinoma 06/12/2016   right medial sup canthus below eyebrow   Basal cell carcinoma 09/23/2014   left nasal bridge   Basal cell carcinoma 03/26/2013   left nasal sidewall   Basal cell carcinoma 08/27/2012   left nasal dorsum   Cancer (HCC)    shoulder, nose skin CA- basal cell CA   GERD (gastroesophageal reflux disease)    occasional   Hyperlipidemia    Osteopenia    Polyp of transverse colon    Right arm weakness 03/14/2019   Stress due to illness of family member 05/29/2018    Surgical History: Past Surgical History:  Procedure  Laterality Date   BREAST BIOPSY Right    core bx- neg   COLONOSCOPY     COLONOSCOPY WITH PROPOFOL N/A 10/08/2018   Procedure: COLONOSCOPY WITH PROPOFOL;  Surgeon: Lucilla Lame, MD;  [facility];  Service: Endoscopy;  Laterality: N/A;   MOHS SURGERY     TONSILLECTOMY     VAGINAL HYSTERECTOMY      Home Medications:  Allergies as of 07/18/2021       Reactions   Tetanus Toxoids Shortness Of Breath   Bromsulphalein    Sulfa Antibiotics    Severe vomiting        Medication List        Accurate as of July 18, 2021  9:32 AM. If you have any questions, ask your nurse or doctor.          ASPIRIN PO Take by mouth as needed.   calcium carbonate 1250 (500 Ca) MG tablet Commonly known as: OS-CAL - dosed in mg of elemental calcium Take 1 tablet by mouth daily with breakfast.   escitalopram 20 MG tablet Commonly known as: LEXAPRO Take 1 tablet (20 mg total) by mouth daily.   famotidine 10 MG tablet Commonly known as: PEPCID Take 1 tablet (10 mg total) by mouth 2 (two)  times daily as needed for heartburn or indigestion.   GLUCOSAMINE-CHONDROIT-CALCIUM PO Take 1,000 mg by mouth daily.   hydrOXYzine 25 MG tablet Commonly known as: ATARAX TAKE 1-2 TABLETS (25-50 MG TOTAL) BY MOUTH AT BEDTIME AS NEEDED FOR ANXIETY (INSOMNIA).   levofloxacin 750 MG tablet Commonly known as: Levaquin Take 1 tablet (750 mg total) by mouth daily.   lovastatin 20 MG tablet Commonly known as: MEVACOR TAKE 1 TABLET BY MOUTH EVERYDAY AT BEDTIME   metoprolol tartrate 25 MG tablet Commonly known as: LOPRESSOR Take 0.5 tablets (12.5 mg total) by mouth 2 (two) times daily as needed (palpitations).   vitamin B-12 1000 MCG tablet Commonly known as: CYANOCOBALAMIN Take 1,000 mcg by mouth daily.        Allergies:  Allergies  Allergen Reactions   Tetanus Toxoids Shortness Of Breath   Bromsulphalein    Sulfa Antibiotics     Severe vomiting    Family History: Family History   Problem Relation Age of Onset   Colon cancer Paternal Uncle    Rectal cancer Paternal Uncle    Cancer Mother        lung    Diabetes Father    Parkinson's disease Father    Alzheimer's disease Father    Heart attack Father 76   Alcohol abuse Sister    Heart disease Sister    Lung cancer Sister    Cancer Son        livcolon, liver stage 4   Liver cancer Son    Stroke Paternal Grandmother    Cancer Paternal Grandfather        lung   Down syndrome Sister    Esophageal cancer Neg Hx    Stomach cancer Neg Hx    Breast cancer Neg Hx     Social History:  reports that she quit smoking about 30 years ago. Her smoking use included cigarettes. She has a 30.00 pack-year smoking history. She has been exposed to tobacco smoke. She has never used smokeless tobacco. She reports current alcohol use of about 1.0 standard drink per week. She reports that she does not use drugs.  ROS:                                        Physical Exam: LMP  (LMP Unknown)   Constitutional:  Alert and oriented, No acute distress. HEENT: [place] AT, moist mucus membranes.  Trachea midline, no masses. Cardiovascular: No clubbing, cyanosis, or edema. Respiratory: Normal respiratory effort, no increased work of breathing. GI: Abdomen is soft, nontender, nondistended, no abdominal masses GU: Patient had mild grade 2 hypermobility the bladder neck and negative cough test.  No prolapse. Skin: No rashes, bruises or suspicious lesions. Lymph: No cervical or inguinal adenopathy. Neurologic: Grossly intact, no focal deficits, moving all 4 extremities. Psychiatric: Normal mood and affect.  Laboratory Data: Lab Results  Component Value Date   WBC 10.1 06/09/2020   HGB 14.7 06/09/2020   HCT 44.2 06/09/2020   MCV 94.8 06/09/2020   PLT 187 06/09/2020    Lab Results  Component Value Date   CREATININE 0.95 05/16/2021    No results found for: PSA  No results found for: TESTOSTERONE  Lab  Results  Component Value Date   HGBA1C 5.4 06/09/2020    Urinalysis    Component Value Date/Time   COLORURINE YELLOW 06/09/2020 1528   [place] 06/09/2020 1528  APPEARANCEUR Cloudy (A) 06/03/2015 0000   LABSPEC 1.023 06/09/2020 1528   PHURINE < OR = 5.0 06/09/2020 1528   GLUCOSEU NEGATIVE 06/09/2020 1528   HGBUR NEGATIVE 06/09/2020 1528   BILIRUBINUR Negative 06/28/2021 1102   BILIRUBINUR Negative 06/03/2015 0000   KETONESUR NEGATIVE 06/09/2020 1528   PROTEINUR Negative 06/28/2021 1102   PROTEINUR NEGATIVE 06/09/2020 1528   UROBILINOGEN 0.2 06/28/2021 1102   NITRITE Negative 06/28/2021 1102   NITRITE NEGATIVE 06/09/2020 1528   LEUKOCYTESUR Trace (A) 06/28/2021 1102   LEUKOCYTESUR NEGATIVE 06/09/2020 1528    Pertinent Imaging: Urine reviewed.  Urine sent for culture chart reviewed.  Her postvoid residual was 304 mL Urine culture was + May 3 but - Jun 28, 2021  Assessment & Plan: Patient has mixed incontinence but primarily urge incontinence.  She has minimal frequency and mild nocturia.  She has a good flow with an elevated residual.  She recently developed a bladder infection difficult to clear.  Because of elevated residual I get a renal ultrasound for silent hydronephrosis.  I will initially treat her mixed incontinence with medication and may need to order urodynamics in the future.  I want to keep an eye on her residual.  Reassess in 6 weeks on Myrbetriq 50 mg samples and prescription and check residual and do cystoscopy on that visit.  Call if culture positive  1. Urinary incontinence, unspecified type  - BLADDER SCAN AMB NON-IMAGING - Urinalysis, Complete   No follow-ups on file.  Reece Packer, MD  [facility] [address], [place], [place] [address] [phone]

## 2021-07-19 ENCOUNTER — Ambulatory Visit
Admission: RE | Admit: 2021-07-19 | Discharge: 2021-07-19 | Disposition: A | Discharge status: Home / Self Care | Payer: PPO | Source: Ambulatory Visit | Attending: Family Medicine | Admitting: Family Medicine

## 2021-07-19 DIAGNOSIS — Z1231 Encounter for screening mammogram for malignant neoplasm of breast: Secondary | ICD-10-CM | POA: Diagnosis not present

## 2021-07-21 LAB — CULTURE, URINE COMPREHENSIVE

## 2021-07-28 ENCOUNTER — Encounter: Payer: Self-pay | Admitting: Anesthesiology

## 2021-07-28 ENCOUNTER — Encounter: Payer: Self-pay | Admitting: Gastroenterology

## 2021-08-02 ENCOUNTER — Encounter: Payer: Self-pay | Admitting: Family Medicine

## 2021-08-02 ENCOUNTER — Ambulatory Visit
Admission: RE | Admit: 2021-08-02 | Discharge: 2021-08-02 | Disposition: A | Discharge status: Home / Self Care | Payer: PPO | Source: Ambulatory Visit | Attending: Urology | Admitting: Urology

## 2021-08-02 ENCOUNTER — Other Ambulatory Visit: Payer: Self-pay | Admitting: Family Medicine

## 2021-08-02 DIAGNOSIS — N1330 Unspecified hydronephrosis: Secondary | ICD-10-CM | POA: Insufficient documentation

## 2021-08-02 DIAGNOSIS — F320 Major depressive disorder, single episode, mild: Secondary | ICD-10-CM

## 2021-08-02 DIAGNOSIS — N281 Cyst of kidney, acquired: Secondary | ICD-10-CM | POA: Diagnosis not present

## 2021-08-03 ENCOUNTER — Telehealth: Payer: Self-pay

## 2021-08-03 DIAGNOSIS — R32 Unspecified urinary incontinence: Secondary | ICD-10-CM

## 2021-08-03 DIAGNOSIS — N838 Other noninflammatory disorders of ovary, fallopian tube and broad ligament: Secondary | ICD-10-CM

## 2021-08-03 NOTE — Telephone Encounter (Signed)
Patient lvm stating that she did not know if she should go ahead with her colonoscopy as her physician just told her she has a mass on her kidneys and they are trying to schedule her a stat MRI.   Phone call returned informing patient that we can cancel her colonoscopy and reschedule it after she has addressed the kidney mass. Penny at [facility]  has been notified of this.  Thanks, [place], [place]

## 2021-08-03 NOTE — Telephone Encounter (Signed)
Pt called and would like her RUS results

## 2021-08-03 NOTE — Telephone Encounter (Signed)
Orders placed as STAT. Pt aware and verbalized understanding.

## 2021-08-04 ENCOUNTER — Ambulatory Visit
Admission: RE | Admit: 2021-08-04 | Discharge: 2021-08-04 | Disposition: A | Discharge status: Home / Self Care | Payer: PPO | Source: Ambulatory Visit | Attending: Urology | Admitting: Urology

## 2021-08-04 DIAGNOSIS — N838 Other noninflammatory disorders of ovary, fallopian tube and broad ligament: Secondary | ICD-10-CM

## 2021-08-04 DIAGNOSIS — R1909 Other intra-abdominal and pelvic swelling, mass and lump: Secondary | ICD-10-CM | POA: Diagnosis not present

## 2021-08-04 MED ORDER — GADOBUTROL 1 MMOL/ML IV SOLN
7.5000 mL | Freq: Once | INTRAVENOUS | Status: AC | PRN
  Administered 2021-08-04: 7.5 mL via INTRAVENOUS

## 2021-08-05 ENCOUNTER — Telehealth: Payer: Self-pay

## 2021-08-05 ENCOUNTER — Ambulatory Visit: Admission: RE | Admit: 2021-08-05 | Payer: PPO | Source: Home / Self Care | Admitting: Gastroenterology

## 2021-08-05 ENCOUNTER — Encounter: Payer: Self-pay | Admitting: Family Medicine

## 2021-08-05 ENCOUNTER — Other Ambulatory Visit: Payer: Self-pay

## 2021-08-05 DIAGNOSIS — N838 Other noninflammatory disorders of ovary, fallopian tube and broad ligament: Secondary | ICD-10-CM

## 2021-08-05 SURGERY — COLONOSCOPY WITH PROPOFOL
Anesthesia: Choice

## 2021-08-05 NOTE — Telephone Encounter (Signed)
Received referral from Dr. Sherron Monday for ovarian mass. Contacted [name] and appointment has been arranged for 08/10/21 at 1015 with Dr. Sonia Side. Directions to the cancer center given.

## 2021-08-10 ENCOUNTER — Inpatient Hospital Stay: Payer: PPO | Attending: Obstetrics and Gynecology | Admitting: Obstetrics and Gynecology

## 2021-08-10 ENCOUNTER — Inpatient Hospital Stay: Payer: PPO

## 2021-08-10 VITALS — BP 135/90 | HR 60 | Resp 18 | Ht 64.0 in | Wt 182.0 lb

## 2021-08-10 DIAGNOSIS — D3910 Neoplasm of uncertain behavior of unspecified ovary: Secondary | ICD-10-CM | POA: Insufficient documentation

## 2021-08-10 DIAGNOSIS — I471 Supraventricular tachycardia: Secondary | ICD-10-CM | POA: Diagnosis not present

## 2021-08-10 DIAGNOSIS — Z90710 Acquired absence of both cervix and uterus: Secondary | ICD-10-CM | POA: Insufficient documentation

## 2021-08-10 DIAGNOSIS — J45909 Unspecified asthma, uncomplicated: Secondary | ICD-10-CM | POA: Insufficient documentation

## 2021-08-10 DIAGNOSIS — R971 Elevated cancer antigen 125 [CA 125]: Secondary | ICD-10-CM | POA: Insufficient documentation

## 2021-08-10 DIAGNOSIS — R188 Other ascites: Secondary | ICD-10-CM | POA: Insufficient documentation

## 2021-08-10 DIAGNOSIS — N838 Other noninflammatory disorders of ovary, fallopian tube and broad ligament: Secondary | ICD-10-CM

## 2021-08-10 DIAGNOSIS — K219 Gastro-esophageal reflux disease without esophagitis: Secondary | ICD-10-CM | POA: Diagnosis not present

## 2021-08-10 DIAGNOSIS — M8580 Other specified disorders of bone density and structure, unspecified site: Secondary | ICD-10-CM | POA: Insufficient documentation

## 2021-08-10 DIAGNOSIS — Z791 Long term (current) use of non-steroidal anti-inflammatories (NSAID): Secondary | ICD-10-CM | POA: Insufficient documentation

## 2021-08-10 DIAGNOSIS — E782 Mixed hyperlipidemia: Secondary | ICD-10-CM | POA: Diagnosis not present

## 2021-08-10 DIAGNOSIS — Z6831 Body mass index (BMI) 31.0-31.9, adult: Secondary | ICD-10-CM | POA: Insufficient documentation

## 2021-08-10 DIAGNOSIS — Z79899 Other long term (current) drug therapy: Secondary | ICD-10-CM | POA: Diagnosis not present

## 2021-08-10 DIAGNOSIS — E559 Vitamin D deficiency, unspecified: Secondary | ICD-10-CM | POA: Insufficient documentation

## 2021-08-10 DIAGNOSIS — E669 Obesity, unspecified: Secondary | ICD-10-CM | POA: Insufficient documentation

## 2021-08-10 DIAGNOSIS — Z85828 Personal history of other malignant neoplasm of skin: Secondary | ICD-10-CM | POA: Diagnosis not present

## 2021-08-10 LAB — COMPREHENSIVE METABOLIC PANEL
ALT: 15 U/L (ref 0–44)
AST: 18 U/L (ref 15–41)
Albumin: 3.9 g/dL (ref 3.5–5.0)
Alkaline Phosphatase: 54 U/L (ref 38–126)
Anion gap: 4 — ABNORMAL LOW (ref 5–15)
BUN: 15 mg/dL (ref 8–23)
CO2: 28 mmol/L (ref 22–32)
Calcium: 8.1 mg/dL — ABNORMAL LOW (ref 8.9–10.3)
Chloride: 107 mmol/L (ref 98–111)
Creatinine, Ser: 1 mg/dL (ref 0.44–1.00)
GFR, Estimated: 58 mL/min — ABNORMAL LOW (ref >60)
Glucose, Bld: 90 mg/dL (ref 70–99)
Potassium: 4.3 mmol/L (ref 3.5–5.1)
Sodium: 139 mmol/L (ref 135–145)
Total Bilirubin: 0.5 mg/dL (ref 0.3–1.2)
Total Protein: 6.4 g/dL — ABNORMAL LOW (ref 6.5–8.1)

## 2021-08-10 LAB — CBC WITH DIFFERENTIAL/PLATELET
Abs Immature Granulocytes: 0.04 10*3/uL (ref 0.00–0.07)
Basophils Absolute: 0.1 10*3/uL (ref 0.0–0.1)
Basophils Relative: 1 %
Eosinophils Absolute: 0.3 10*3/uL (ref 0.0–0.5)
Eosinophils Relative: 3 %
HCT: 42.9 % (ref 36.0–46.0)
Hemoglobin: 14.2 g/dL (ref 12.0–15.0)
Immature Granulocytes: 0 %
Lymphocytes Relative: 52 %
Lymphs Abs: 6.5 10*3/uL — ABNORMAL HIGH (ref 0.7–4.0)
MCH: 30.8 pg (ref 26.0–34.0)
MCHC: 33.1 g/dL (ref 30.0–36.0)
MCV: 93.1 fL (ref 80.0–100.0)
Monocytes Absolute: 0.5 10*3/uL (ref 0.1–1.0)
Monocytes Relative: 4 %
Neutro Abs: 4.9 10*3/uL (ref 1.7–7.7)
Neutrophils Relative %: 40 %
Platelets: 192 10*3/uL (ref 150–400)
RBC: 4.61 MIL/uL (ref 3.87–5.11)
RDW: 13.5 % (ref 11.5–15.5)
Smear Review: NORMAL
WBC Morphology: ABNORMAL
WBC: 12.3 10*3/uL — ABNORMAL HIGH (ref 4.0–10.5)
nRBC: 0 % (ref 0.0–0.2)

## 2021-08-10 NOTE — Progress Notes (Signed)
Patient reports: asthma/wheezing, weight gain, trouble sleeping (melatonin), and urinary frequency.

## 2021-08-10 NOTE — Progress Notes (Signed)
Gynecologic Oncology Consult Visit   Referring Provider: Dr. Matilde Sprang  Chief Complaint: Ovarian Mass  Subjective:  [name] is a 75 y.o. female s/p prior hysterectomy (ovaries in situ) who is seen in consultation from Dr. Matilde Sprang for ovarian mass.   Patient presented to Urology for incontinence. She had elevated residual urine and ultrasound was performed which showed 12.2 x 10.1 x 2.9 cm mixed cystic and solid mass suspicious for ovarian mass.   MRI 08/04/21- Reproductive: Large cystic and solid pelvic mass centered in the pelvis. Ovarian tissue or ovary is not demonstrated on the current study. Mass in total measuring 15 x 11 x 12 cm. Soft tissue component showing restricted diffusion and enhancement centered in the lower portion of the mass shows heterogeneous T2 signal and measures 10.8 x 6.8 x 8.5 cm amidst cystic areas. There is a small amount of associated ascites. The mass abuts the vaginal apex following hysterectomy, potentially involving the LEFT vaginal apex. The "soft tissue component displays very heterogeneous enhancement and there are numerous enhancing septations throughout. Collateral pathways from LEFT ovarian vein are noted about the LEFT lateral aspect of the mass. Given that the gonadal vein can be traced to the mass suspicion is for mass of LEFT ovarian origin though this may of parasitized flow from ovarian. Vessels on the LEFT   Other: Trace ascites. No discrete peritoneal nodularity about the pelvis.   Musculoskeletal: No suspicious bone lesion   IMPRESSION: 1. Large cystic and solid pelvic mass highly suspicious for neoplasm. Tumor may arise from the LEFT ovary based on proximity to LEFT ovarian vein though no defined normal ovarian tissue can be seen on either the RIGHT or the LEFT on today's exam. Correlate with surgical history. Sarcoma is also considered based on the marked heterogeneity seen within the lesion on the current study. There is strong  restricted diffusion within and avid enhancement of the large central soft tissue component. Gyn consultation gyn consultation is suggested. 2. Trace ascites. 3. Tumor in close proximity to sigmoid colon without definitive signs of involvement also in close proximity to the urinary bladder with defined plane between urinary bladder and mass. Mass may involve the LEFT vaginal fornix. 4. Small amount of associated ascites.  She has history of hysterectomy at age 33 for endometriosis.     Problem List: Patient Active Problem List   Diagnosis Date Noted   Ovarian mass 08/10/2021   PSVT (paroxysmal supraventricular tachycardia) ([place]) 08/14/2019   Vitamin D deficiency 06/10/2019   Current mild episode of major depressive disorder without prior episode ([place]) 10/15/2018   GAD (generalized anxiety disorder) 10/15/2018   Personal history of colonic polyps    Obesity (BMI 30.0-34.9) 05/29/2018   GERD (gastroesophageal reflux disease) 11/27/2017   Osteopenia 11/27/2017   Mixed hyperlipidemia 12/09/2014   Insomnia due to stress 12/09/2014    Past Medical History: Past Medical History:  Diagnosis Date   Allergy    Arthritis    Asthma    Atypical chest pain 03/14/2019   Atypical mole 06/12/2018   right prox lat thigh/mod   Atypical mole 05/19/2014   left sup med buttock/excision   Atypical mole 03/26/2014   right sup lat buttock   Atypical mole 03/26/2013   left lat mid back   Atypical mole 11/08/2006   right shoulder superior/excision   Basal cell carcinoma 06/12/2016   right medial sup canthus below eyebrow   Basal cell carcinoma 09/23/2014   left nasal bridge   Basal cell carcinoma  03/26/2013   left nasal sidewall   Basal cell carcinoma 08/27/2012   left nasal dorsum   Cancer (HCC)    shoulder, nose skin CA- basal cell CA   GERD (gastroesophageal reflux disease)    occasional   Hyperlipidemia    Osteopenia    Polyp of transverse colon    Right arm weakness 03/14/2019    Stress due to illness of family member 05/29/2018    Past Surgical History: Past Surgical History:  Procedure Laterality Date   BREAST BIOPSY Right    core bx- neg   COLONOSCOPY     COLONOSCOPY WITH PROPOFOL N/A 10/08/2018   Procedure: COLONOSCOPY WITH PROPOFOL;  Surgeon: Lucilla Lame, MD;  Location: [facility] ENDOSCOPY;  Service: Endoscopy;  Laterality: N/A;   MOHS SURGERY     TONSILLECTOMY     VAGINAL HYSTERECTOMY      Past Gynecologic History:  Menarche: age 54 Hysterectomy at age 70.    OB History:  OB History  No obstetric history on file.    Family History: Family History  Problem Relation Age of Onset   Colon cancer Paternal Uncle    Rectal cancer Paternal Uncle    Cancer Mother        lung    Diabetes Father    Parkinson's disease Father    Alzheimer's disease Father    Heart attack Father 68   Alcohol abuse Sister    Heart disease Sister    Lung cancer Sister    Cancer Son        livcolon, liver stage 4   Liver cancer Son    Stroke Paternal Grandmother    Cancer Paternal Grandfather        lung   Down syndrome Sister    Esophageal cancer Neg Hx    Stomach cancer Neg Hx    Breast cancer Neg Hx     Social History: Social History   Socioeconomic History   Marital status: Married    Spouse name: [name]   Number of children: 2   Years of education: Not on file   Highest education level: Bachelor's degree (e.g., BA, AB, BS)  Occupational History   Occupation: Retired  Tobacco Use   Smoking status: Former    Packs/day: 1.50    Years: 20.00    Total pack years: 30.00    Types: Cigarettes    Quit date: 1993    Years since quitting: 30.5    Passive exposure: Past   Smokeless tobacco: Never  Vaping Use   Vaping Use: Never used  Substance and Sexual Activity   Alcohol use: Yes    Alcohol/week: 1.0 standard drink of alcohol    Types: 1 Glasses of wine per week    Comment: one glass of wine once a month   Drug use: Never   Sexual activity: Not  Currently  Other Topics Concern   Not on file  Social History Narrative   Not on file   Social Determinants of Health   Financial Resource Strain: Low Risk  (03/03/2021)   Overall Financial Resource Strain (CARDIA)    Difficulty of Paying Living Expenses: Not hard at all  Food Insecurity: No Food Insecurity (03/03/2021)   Hunger Vital Sign    Worried About Running Out of Food in the Last Year: Never true    Ran Out of Food in the Last Year: Never true  Transportation Needs: No Transportation Needs (03/03/2021)   PRAPARE - Transportation    Lack  of Transportation (Medical): No    Lack of Transportation (Non-Medical): No  Physical Activity: Sufficiently Active (03/03/2021)   Exercise Vital Sign    Days of Exercise per Week: 7 days    Minutes of Exercise per Session: 30 min  Stress: No Stress Concern Present (03/03/2021)   [place]    Feeling of Stress : Not at all  Social Connections: Moderately Integrated (03/03/2021)   Social Connection and Isolation Panel [NHANES]    Frequency of Communication with Friends and Family: More than three times a week    Frequency of Social Gatherings with Friends and Family: Three times a week    Attends Religious Services: More than 4 times per year    Active Member of Clubs or Organizations: No    Attends Archivist Meetings: Never    Marital Status: Married  Human resources officer Violence: Not At Risk (03/03/2021)   Humiliation, Afraid, Rape, and Kick questionnaire    Fear of Current or Ex-Partner: No    Emotionally Abused: No    Physically Abused: No    Sexually Abused: No    Allergies: Allergies  Allergen Reactions   Tetanus Toxoids Shortness Of Breath   Sulfa Antibiotics     Severe vomiting    Current Medications: Current Outpatient Medications  Medication Sig Dispense Refill   acetaminophen (TYLENOL) 325 MG tablet Take 650 mg by mouth every 6 (six) hours as  needed.     Ascorbic Acid (VITAMIN C) 1000 MG tablet Take 1,000 mg by mouth daily.     calcium carbonate (OS-CAL - DOSED IN MG OF ELEMENTAL CALCIUM) 1250 (500 Ca) MG tablet Take 1 tablet by mouth daily with breakfast.     escitalopram (LEXAPRO) 20 MG tablet Take 1 tablet (20 mg total) by mouth daily. 90 tablet 3   famotidine (PEPCID) 10 MG tablet Take 1 tablet (10 mg total) by mouth 2 (two) times daily as needed for heartburn or indigestion.     hydrOXYzine (ATARAX/VISTARIL) 25 MG tablet TAKE 1-2 TABLETS (25-50 MG TOTAL) BY MOUTH AT BEDTIME AS NEEDED FOR ANXIETY (INSOMNIA). 180 tablet 2   lovastatin (MEVACOR) 20 MG tablet TAKE 1 TABLET BY MOUTH EVERYDAY AT BEDTIME 90 tablet 3   lovastatin (MEVACOR) 20 MG tablet Take 1 tablet by mouth at bedtime.     melatonin 3 MG TABS tablet Take 3 mg by mouth at bedtime.     meloxicam (MOBIC) 15 MG tablet Take 1 tablet by mouth daily.     vitamin B-12 (CYANOCOBALAMIN) 1000 MCG tablet Take 1,000 mcg by mouth daily.     ASPIRIN PO Take by mouth as needed. (Patient not taking: Reported on 08/10/2021)     mirabegron ER (MYRBETRIQ) 50 MG TB24 tablet Take 1 tablet (50 mg total) by mouth daily. (Patient not taking: Reported on 08/10/2021) 30 tablet 11   No current facility-administered medications for this visit.    General: negative for fevers, changes in weight or night sweats. Positive for weight gain Skin: negative for changes in moles or sores or rash Eyes: negative for changes in vision HEENT: negative for change in hearing, tinnitus, voice changes Pulmonary: negative for dyspnea, orthopnea, productive cough. Positive for wheezing Cardiac: negative for palpitations, pain Gastrointestinal: negative for nausea, vomiting, constipation, diarrhea, hematemesis, hematochezia Genitourinary/Sexual: negative for dysuria, retention, hematuria, incontinence. Positive for urinary frequency Ob/Gyn:  negative for abnormal bleeding, or pain Musculoskeletal: negative for  pain, joint pain, back pain Hematology: negative  for easy bruising, abnormal bleeding Neurologic/Psych: negative for headaches, seizures, paralysis, weakness, numbness  Objective:  Physical Examination:  BP 135/90 (BP Location: Left Arm, Patient Position: Sitting)   Pulse 60   Resp 18   Ht '5\' 4"'$  (1.626 m)   Wt 182 lb (82.6 kg)   LMP  (LMP Unknown)   BMI 31.24 kg/m     ECOG Performance Status: 1 - Symptomatic but completely ambulatory  GENERAL: Patient is a well appearing female in no acute distress. Accompanied by husband.  HEENT:  PERRL, neck supple with midline trachea. Thyroid without masses.  NODES:  No cervical, supraclavicular, axillary, or inguinal lymphadenopathy palpated.  LUNGS:  Clear to auscultation bilaterally.  No wheezes or rhonchi. HEART:  Regular rate and rhythm. No murmur appreciated. ABDOMEN:  nontender. Soft but appears mildly full. Active bowel sounds. No masses, ascites, or hernias.  MSK:  No focal spinal tenderness to palpation. Full range of motion bilaterally in the upper extremities. EXTREMITIES:  No peripheral edema.   SKIN:  Clear with no obvious rashes or skin changes.  NEURO:  Nonfocal. Well oriented.  Appropriate affect.  Pelvic: Exam chaperoned by RN EGBUS: no lesions Cervix: absent Vagina: no lesions, no discharge or bleeding; there appears to be a mass effect at the apex Uterus: absent Adnexa: firm palpable masses, at least 10 cm, fixed the the vaginal cuff. Does not extend to the side wall and parametrial tissue is smooth Rectovaginal: confirmatory. There is not rectal mucosal involvement  Lab Review Labs on site today: Tumor markers, CBC, and CMP ordered  Radiologic Imaging: CT C/A/P ordered    Assessment:  ANMARIE FUKUSHIMA is a 76 y.o. female s/p hysterectomy for vaginal bleeding presume benign pathology diagnosed with large symptomatic pelvic mass and trace ascites. Findings concerning for malignancy.   Medical co-morbidities  complicating care: prior vaginal hysterectomy; asthma, and prior skin cancers Plan:   Problem List Items Addressed This Visit       Other   Ovarian mass - Primary   Relevant Orders   CT CHEST ABDOMEN PELVIS W CONTRAST   CA 125   CEA   Human Epididymis Prot 4,Serial   CBC with Differential/Platelet   Comprehensive metabolic panel   We reviewed imaging and exam findings, the differential diagnosis, and discussed options for management.   We recommended Labs on site today including tumor markers, CBC, and CMP which were ordered and CT C/A/P. If she has findings concerning for widely metastatic disease then FNA/core biopsy. If no evidence of metastatic disease then proceed with surgical management. Given location of the mass and findings on imaging and exam there may be need for bladder resection. Discussed with the [place] team and they are aware that we may need to schedule surgery.  The patient's diagnosis, an outline of the further diagnostic and laboratory studies which will be required, the recommendation for surgery, and alternatives were discussed with her and her accompanying family members.  All questions were answered to their satisfaction.  A total of 100 minutes were spent with the patient/family today; >50% was spent in education, counseling and coordination of care for pelvic mass.     I personally had a face to face interaction and evaluated the patient jointly with the NP, Ms. Beckey Rutter.  I have reviewed her history and available records and have performed the key portions of the physical exam including lymph node survey, abdominal exam, pelvic exam with my findings confirming those documented above by the APP.  I have discussed the case with the APP and the patient.  I agree with the above documentation, assessment and plan which was fully formulated by me.  Counseling was completed by me.   I personally saw the patient and performed a substantive portion of this encounter in  conjunction with the listed APP as documented above.  Kipp Shank Gaetana Michaelis, MD      CC: Dr. Matilde Sprang

## 2021-08-11 ENCOUNTER — Ambulatory Visit
Admission: RE | Admit: 2021-08-11 | Discharge: 2021-08-11 | Disposition: A | Discharge status: Home / Self Care | Payer: PPO | Source: Ambulatory Visit | Attending: Nurse Practitioner | Admitting: Nurse Practitioner

## 2021-08-11 DIAGNOSIS — D4989 Neoplasm of unspecified behavior of other specified sites: Secondary | ICD-10-CM | POA: Insufficient documentation

## 2021-08-11 DIAGNOSIS — K429 Umbilical hernia without obstruction or gangrene: Secondary | ICD-10-CM | POA: Diagnosis not present

## 2021-08-11 DIAGNOSIS — N281 Cyst of kidney, acquired: Secondary | ICD-10-CM | POA: Diagnosis not present

## 2021-08-11 DIAGNOSIS — R918 Other nonspecific abnormal finding of lung field: Secondary | ICD-10-CM | POA: Insufficient documentation

## 2021-08-11 DIAGNOSIS — N83202 Unspecified ovarian cyst, left side: Secondary | ICD-10-CM | POA: Insufficient documentation

## 2021-08-11 DIAGNOSIS — D1803 Hemangioma of intra-abdominal structures: Secondary | ICD-10-CM | POA: Insufficient documentation

## 2021-08-11 DIAGNOSIS — K4090 Unilateral inguinal hernia, without obstruction or gangrene, not specified as recurrent: Secondary | ICD-10-CM | POA: Diagnosis not present

## 2021-08-11 DIAGNOSIS — R161 Splenomegaly, not elsewhere classified: Secondary | ICD-10-CM | POA: Insufficient documentation

## 2021-08-11 DIAGNOSIS — E041 Nontoxic single thyroid nodule: Secondary | ICD-10-CM | POA: Diagnosis not present

## 2021-08-11 DIAGNOSIS — K760 Fatty (change of) liver, not elsewhere classified: Secondary | ICD-10-CM | POA: Insufficient documentation

## 2021-08-11 DIAGNOSIS — K449 Diaphragmatic hernia without obstruction or gangrene: Secondary | ICD-10-CM | POA: Diagnosis not present

## 2021-08-11 DIAGNOSIS — N838 Other noninflammatory disorders of ovary, fallopian tube and broad ligament: Secondary | ICD-10-CM | POA: Diagnosis not present

## 2021-08-11 DIAGNOSIS — I700 Atherosclerosis of aorta: Secondary | ICD-10-CM | POA: Diagnosis not present

## 2021-08-11 DIAGNOSIS — M47816 Spondylosis without myelopathy or radiculopathy, lumbar region: Secondary | ICD-10-CM | POA: Insufficient documentation

## 2021-08-11 DIAGNOSIS — R188 Other ascites: Secondary | ICD-10-CM | POA: Diagnosis not present

## 2021-08-11 LAB — CEA: CEA: 1.1 ng/mL (ref 0.0–4.7)

## 2021-08-11 LAB — HUMAN EPIDIDYMIS PROT 4,SERIAL: HE4: 1045 pmol/L — ABNORMAL HIGH (ref 0.0–96.9)

## 2021-08-11 LAB — CA 125: Cancer Antigen (CA) 125: 526 U/mL — ABNORMAL HIGH (ref 0.0–38.1)

## 2021-08-11 MED ORDER — IOHEXOL 300 MG/ML  SOLN
100.0000 mL | Freq: Once | INTRAMUSCULAR | Status: AC | PRN
  Administered 2021-08-11: 100 mL via INTRAVENOUS

## 2021-08-12 ENCOUNTER — Telehealth: Payer: Self-pay | Admitting: Nurse Practitioner

## 2021-08-12 DIAGNOSIS — N838 Other noninflammatory disorders of ovary, fallopian tube and broad ligament: Secondary | ICD-10-CM

## 2021-08-12 NOTE — Telephone Encounter (Signed)
Spoke to patient. Reviewed results of blood work and scans. Plan for surgery at [facility] given possible need for rectosigmoid resection and large bulky tumor. Patient agrees. I've sent email to [place] Smith, NP who will coordinate patient's care. She will return to [facility] for post-op care.

## 2021-08-15 ENCOUNTER — Encounter: Payer: Self-pay | Admitting: Medical

## 2021-08-15 ENCOUNTER — Ambulatory Visit: Payer: PPO | Admitting: Cardiology

## 2021-08-15 ENCOUNTER — Telehealth: Payer: PPO | Admitting: Family Medicine

## 2021-08-15 VITALS — BP 120/70 | HR 74 | Ht 64.0 in | Wt 183.0 lb

## 2021-08-15 DIAGNOSIS — I471 Supraventricular tachycardia: Secondary | ICD-10-CM | POA: Diagnosis not present

## 2021-08-15 DIAGNOSIS — Z01810 Encounter for preprocedural cardiovascular examination: Secondary | ICD-10-CM

## 2021-08-15 DIAGNOSIS — R0602 Shortness of breath: Secondary | ICD-10-CM

## 2021-08-15 DIAGNOSIS — E782 Mixed hyperlipidemia: Secondary | ICD-10-CM

## 2021-08-15 NOTE — Patient Instructions (Signed)
Medication Instructions:  Your physician recommends that you continue on your current medications as directed. Please refer to the Current Medication list given to you today.  *If you need a refill on your cardiac medications before your next appointment, please call your pharmacy*   Lab Work: None ordered If you have labs (blood work) drawn today and your tests are completely normal, you will receive your results only by: [place] (if you have MyChart) OR A paper copy in the mail If you have any lab test that is abnormal or we need to change your treatment, we will call you to review the results.   Testing/Procedures: Your physician has requested that you have an echocardiogram. Echocardiography is a painless test that uses sound waves to create images of your heart. It provides your doctor with information about the size and shape of your heart and how well your heart's chambers and valves are working. This procedure takes approximately one hour. There are no restrictions for this procedure.  Your physician has requested that you have a lexiscan myoview. For further information please visit [email or web address]. Please follow instruction sheet, as given.    Follow-Up: At [facility], you and your health needs are our priority.  As part of our continuing mission to provide you with exceptional heart care, we have created designated Provider Care Teams.  These Care Teams include your primary Cardiologist (physician) and Advanced Practice Providers (APPs -  Physician Assistants and Nurse Practitioners) who all work together to provide you with the care you need, when you need it.  We recommend signing up for the patient portal called "MyChart".  Sign up information is provided on this After Visit Summary.  MyChart is used to connect with patients for Virtual Visits (Telemedicine).  Patients are able to view lab/test results, encounter notes, upcoming appointments, etc.  Non-urgent  messages can be sent to your provider as well.   To learn more about what you can do with MyChart, go to [email or web address].    Your next appointment:   Your physician wants you to follow-up in: 1 year You will receive a reminder letter in the mail two months in advance. If you don't receive a letter, please call our office to schedule the follow-up appointment.   The format for your next appointment:   In Person  Provider:   You may see [name], MD or one of the following Advanced Practice Providers on your designated Care Team:   Murray Hodgkins, NP Christell Faith, PA-C Cadence Kathlen Mody, [place]   Other Instructions   [place]  Your caregiver has ordered a Stress Test with nuclear imaging. The purpose of this test is to evaluate the blood supply to your heart muscle. This procedure is referred to as a "Non-Invasive Stress Test." This is because other than having an IV started in your vein, nothing is inserted or "invades" your body. Cardiac stress tests are done to find areas of poor blood flow to the heart by determining the extent of coronary artery disease (CAD). Some patients exercise on a treadmill, which naturally increases the blood flow to your heart, while others who are  unable to walk on a treadmill due to physical limitations have a pharmacologic/chemical stress agent called Lexiscan . This medicine will mimic walking on a treadmill by temporarily increasing your coronary blood flow.   Please note: these test may take anywhere between 2-4 hours to complete  PLEASE REPORT TO [place]  FIRST DESK WILL DIRECT YOU WHERE TO GO  Date of Procedure:_____________________________________  Arrival Time for Procedure:______________________________   PLEASE NOTIFY THE OFFICE AT LEAST 24 HOURS IN ADVANCE IF YOU ARE UNABLE TO KEEP YOUR APPOINTMENT.  [phone] AND  PLEASE NOTIFY NUCLEAR MEDICINE AT [facility] AT LEAST 24 HOURS IN ADVANCE  IF YOU ARE UNABLE TO KEEP YOUR APPOINTMENT. [phone]  How to prepare for your Myoview test:  Do not eat or drink after midnight No caffeine for 24 hours prior to test No smoking 24 hours prior to test. Your medication may be taken with water.  If your doctor stopped a medication because of this test, do not take that medication. Ladies, please do not wear dresses.  Skirts or pants are appropriate. Please wear a short sleeve shirt. No perfume or lotion. Wear comfortable walking shoes. No heels!     Important Information About Sugar

## 2021-08-15 NOTE — Progress Notes (Signed)
Cardiology Office Note:    Date:  08/15/2021   ID:  [name], DOB 20-Nov-1945, MRN [id number]  PCP:  [name], PA-C  [facility] Cardiologist:  [name], MD  [facility] Electrophysiologist:  None   Referring MD: [name], PA-C   Chief Complaint: 12 month follow-up  History of Present Illness:    [name] is a 76 y.o. female with a hx of  SVT, HLD, asthma, depression, generalized anxiety disorder, and colon polyps who presents for follow-up of  SVT.   She was evaluated by Dr. [name] as a new patient on 03/14/2019 for palpitations in the setting of increased stress.  There was a remote isolated episode of syncope while donating blood many years prior.  She reported a stress test approximately 20 years prior as part of a CPE that she believed was normal.  Zio patch in 02/2019, showed a predominant rhythm of sinus with an average heart rate of 64 bpm (range 44 to 132 bpm in sinus), rare PACs and PVCs.  There were 20 atrial runs lasting up to 12.1 seconds with a maximal rate of 184 bpm.  There were no sustained arrhythmias or prolonged pauses.  Patient triggered events corresponded to sinus rhythm.  Echo 04/23/2019 showed an EF of 55 to 60%, no RWMA, normal diastolic function, normal RV systolic function and cavity size, and trivial mitral regurgitation.  She was seen in follow-up in 04/2019, continuing to note episodic chest discomfort that was described as a "pinch" and was randomly occurring lasting for several seconds in duration with spontaneous resolution.  In this setting, she underwent Lexiscan MPI in 05/2019 which showed no significant ischemia with CT attenuation corrected images showing mild aortic atherosclerosis with no significant coronary artery calcification noted.  Overall, this was a low risk study.  She was last seen in the office in 07/2019 and was doing relatively well.  She noted only 2 episodes of palpitations since her last visit.  1 occurred in the setting  of significant stress.  She had not needed any as needed metoprolol.  Her chronic exertional dyspnea was unchanged.  No changes were made at that time.  Last seen 08/2020 and was doing well from a cardiac perspective. Reported minimal palpitations.   She is back in clinic today stating that she is feeling fairly well. She endorses shortness of breath on hills and inclines but denies any chest pain, palpitations, or chest pressure. She denies any recent hospitalizations or visits to the emergency department. She had been suffering from recurrent UTI's and underwent evaluation from urology and had a abdominal ultrasound that revealed a 12.2 x 10.1 x 2.9 cm mixed cystic and solid mass suspicious for an ovarian mass. MRI on 08/04/21 revealed large cystic and solid pelvic mass centered in the pelvis. Mass measured 15 x 11 x 12 cm. She was referred to  Gynecologic Oncology and was seen on 08/10/2021. She has now been referred to [facility] for surgery but that date and appointment have not been scheduled as of yet due to the holiday. The last cardiac testing that she had undergone was in 2021.   Past Medical History:  Diagnosis Date   Allergy    Arthritis    Asthma    Atypical chest pain 03/14/2019   Atypical mole 06/12/2018   right prox lat thigh/mod   Atypical mole 05/19/2014   left sup med buttock/excision   Atypical mole 03/26/2014   right sup lat buttock   Atypical  mole 03/26/2013   left lat mid back   Atypical mole 11/08/2006   right shoulder superior/excision   Basal cell carcinoma 06/12/2016   right medial sup canthus below eyebrow   Basal cell carcinoma 09/23/2014   left nasal bridge   Basal cell carcinoma 03/26/2013   left nasal sidewall   Basal cell carcinoma 08/27/2012   left nasal dorsum   Cancer (HCC)    shoulder, nose skin CA- basal cell CA   GERD (gastroesophageal reflux disease)    occasional   Hyperlipidemia    Osteopenia    Polyp of transverse colon    Right arm weakness  03/14/2019   Stress due to illness of family member 05/29/2018    Past Surgical History:  Procedure Laterality Date   BREAST BIOPSY Right    core bx- neg   COLONOSCOPY     COLONOSCOPY WITH PROPOFOL N/A 10/08/2018   Procedure: COLONOSCOPY WITH PROPOFOL;  Surgeon: Lucilla Lame, MD;  [facility] ENDOSCOPY;  Service: Endoscopy;  Laterality: N/A;   MOHS SURGERY     TONSILLECTOMY     VAGINAL HYSTERECTOMY      Current Medications: Current Meds  Medication Sig   acetaminophen (TYLENOL) 325 MG tablet Take 650 mg by mouth every 6 (six) hours as needed.   Ascorbic Acid (VITAMIN C) 1000 MG tablet Take 1,000 mg by mouth daily.   calcium carbonate (OS-CAL - DOSED IN MG OF ELEMENTAL CALCIUM) 1250 (500 Ca) MG tablet Take 1 tablet by mouth daily with breakfast.   escitalopram (LEXAPRO) 20 MG tablet Take 1 tablet (20 mg total) by mouth daily.   famotidine (PEPCID) 10 MG tablet Take 1 tablet (10 mg total) by mouth 2 (two) times daily as needed for heartburn or indigestion.   hydrOXYzine (ATARAX/VISTARIL) 25 MG tablet TAKE 1-2 TABLETS (25-50 MG TOTAL) BY MOUTH AT BEDTIME AS NEEDED FOR ANXIETY (INSOMNIA).   lovastatin (MEVACOR) 20 MG tablet TAKE 1 TABLET BY MOUTH EVERYDAY AT BEDTIME   melatonin 3 MG TABS tablet Take 3 mg by mouth at bedtime.   meloxicam (MOBIC) 15 MG tablet Take 1 tablet by mouth daily.   meloxicam (MOBIC) 15 MG tablet Take 15 mg by mouth daily as needed for pain.   vitamin B-12 (CYANOCOBALAMIN) 1000 MCG tablet Take 1,000 mcg by mouth daily.     Allergies:   Tetanus toxoids and Sulfa antibiotics   Social History   Socioeconomic History   Marital status: Married    Spouse name: [name]   Number of children: 2   Years of education: Not on file   Highest education level: Bachelor's degree (e.g., BA, AB, BS)  Occupational History   Occupation: Retired  Tobacco Use   Smoking status: Former    Packs/day: 1.50    Years: 20.00    Total pack years: 30.00    Types: Cigarettes    Quit  date: 1993    Years since quitting: 30.5    Passive exposure: Past   Smokeless tobacco: Never  Vaping Use   Vaping Use: Never used  Substance and Sexual Activity   Alcohol use: Yes    Alcohol/week: 1.0 standard drink of alcohol    Types: 1 Glasses of wine per week    Comment: one glass of wine once a month   Drug use: Never   Sexual activity: Not Currently  Other Topics Concern   Not on file  Social History Narrative   Not on file   Social Determinants of Health  Financial Resource Strain: Low Risk  (03/03/2021)   Overall Financial Resource Strain (CARDIA)    Difficulty of Paying Living Expenses: Not hard at all  Food Insecurity: No Food Insecurity (03/03/2021)   Hunger Vital Sign    Worried About Running Out of Food in the Last Year: Never true    Ran Out of Food in the Last Year: Never true  Transportation Needs: No Transportation Needs (03/03/2021)   PRAPARE - Hydrologist (Medical): No    Lack of Transportation (Non-Medical): No  Physical Activity: Sufficiently Active (03/03/2021)   Exercise Vital Sign    Days of Exercise per Week: 7 days    Minutes of Exercise per Session: 30 min  Stress: No Stress Concern Present (03/03/2021)   Pound    Feeling of Stress : Not at all  Social Connections: Moderately Integrated (03/03/2021)   Social Connection and Isolation Panel [NHANES]    Frequency of Communication with Friends and Family: More than three times a week    Frequency of Social Gatherings with Friends and Family: Three times a week    Attends Religious Services: More than 4 times per year    Active Member of Clubs or Organizations: No    Attends Archivist Meetings: Never    Marital Status: Married     Family History: The patient's family history includes Alcohol abuse in her sister; Alzheimer's disease in her father; Cancer in her mother, paternal grandfather,  and son; Colon cancer in her paternal uncle; Diabetes in her father; Down syndrome in her sister; Heart attack (age of onset: 41) in her father; Heart disease in her sister; Liver cancer in her son; Lung cancer in her sister; Parkinson's disease in her father; Rectal cancer in her paternal uncle; Stroke in her paternal grandmother. There is no history of Esophageal cancer, Stomach cancer, or Breast cancer.  ROS:   Please see the history of present illness.     All other systems reviewed and are negative.  EKGs/Labs/Other Studies Reviewed:    The following studies were reviewed today:  Echo 2021  1. Left ventricular ejection fraction, by estimation, is 55 to 60%. The  left ventricle has normal function. The left ventricle has no regional  wall motion abnormalities. Left ventricular diastolic parameters were  normal.   2. Right ventricular systolic function is normal. The right ventricular  size is normal.   3. The mitral valve is normal in structure. Trivial mitral valve  regurgitation. No evidence of mitral stenosis.   4. The aortic valve is normal in structure. Aortic valve regurgitation is  not visualized. No aortic stenosis is present.   5. The inferior vena cava is normal in size with greater than 50%  respiratory variability, suggesting right atrial pressure of 3 mmHg.   Myoview Lexiscan 2021 Narrative & Impression  Pharmacological myocardial perfusion imaging study with no significant  ischemia Normal wall motion, EF estimated at 81% No EKG changes concerning for ischemia at peak stress or in recovery. CT attenuation correction images with mild aortic atherosclerosis, no significant coronary calcification noted Low risk scan     Signed, Esmond Plants, MD, Ph.D [facility]    Heart monitor 2021   Patient was monitored for 13 days, 21 hours. The predominant rhythm was sinus with an average rate of 64 bpm (range 44 to 132 bpm in sinus). There were rare PACs and PVCs. 20  atrial runs lasting  up to 12.1 seconds with a maximal rate of 184 bpm occurred. No sustained arrhythmia or prolonged pause was identified. Patient triggered events correspond to sinus rhythm.   Predominantly sinus rhythm with rare PACs and PVCs as well as PSVT.  Patient triggered events correspond to sinus rhythm.  EKG:  EKG is ordered today.  The ekg ordered today demonstrates sinus rhythm rate of 74.  Recent Labs: 08/10/2021: ALT 15; BUN 15; Creatinine, Ser 1.00; Hemoglobin 14.2; Platelets 192; Potassium 4.3; Sodium 139  Recent Lipid Panel    Component Value Date/Time   CHOL 155 05/16/2021 0834   CHOL 158 07/07/2015 0817   TRIG 218 (H) 05/16/2021 0834   HDL 46 (L) 05/16/2021 0834   HDL 53 07/07/2015 0817   CHOLHDL 3.4 05/16/2021 0834   VLDL 38 (H) 05/31/2016 0825   LDLCALC 77 05/16/2021 0834     Risk Assessment/Calculations:     Revised Cardiac Risk Index of 1 point Class II risk with a 6% 30-day risk of death, MI, or cardiac arrest.  Physical Exam:    VS:  BP 120/70 (BP Location: Left Arm, Patient Position: Sitting, Cuff Size: Large)   Pulse 74   Ht '5\' 4"'$  (1.626 m)   Wt 183 lb (83 kg)   LMP  (LMP Unknown)   SpO2 97%   BMI 31.41 kg/m     Wt Readings from Last 3 Encounters:  08/15/21 183 lb (83 kg)  08/10/21 182 lb (82.6 kg)  07/18/21 172 lb (78 kg)     GEN: Well nourished, well developed in no acute distress HEENT: Normal NECK: No JVD; No carotid bruits LYMPHATICS: No lymphadenopathy CARDIAC: RRR, no murmurs, rubs, gallops RESPIRATORY:  Clear to auscultation without rales, wheezing or rhonchi  ABDOMEN: Soft, non-tender, non-distended MUSCULOSKELETAL:  No edema; No deformity  SKIN: Warm and dry NEUROLOGIC:  Alert and oriented x 3 PSYCHIATRIC:  Normal affect   ASSESSMENT:    1. Pre-operative cardiovascular examination   2. PSVT (paroxysmal supraventricular tachycardia) ([place])   3. Mixed hyperlipidemia   4. Shortness of breath    PLAN:    In order of  problems listed above: Preoperative cardiovascular examination patient was recently followed up by her PCP Dr. Matilde Sprang for recurrent UTIs underwent ultrasound and was found to have a 12.2 x 10.1 x 2.6 cm mixed cystic and solid mass suspicious for an ovarian mass.  Admitted on 08/04/2021 she was sent for an MRI which showed a large cystic and solid pelvic mass centered in the pelvis.  Ovarian tissue or ovary was not demonstrated on the current study.  Mass in total measuring 15 x 11 x 12 cm.  Soft tissue component showing restricted diffusion and enhancement centered in the lower portion of the mass shows a heterogeneous T2 signal and measures 10.8 x 6.8 x 8.5 cm and is cystic areas there is also small amount of associated ascites.  With her recent finding she did undergo blood work and she had a referral that was sent to [facility].  She is awaiting appointment and surgery dates.  So she will undergo Myoview Lexiscan stress testing as well last 2D echocardiogram. RCRI score of 1  pSVT she continues to do well with minimal episodes of palpitations.  Her resting heart rate today is 74 on exam.  HLD LDL 77 05/16/21.  She continues on lovastatin 20 mg nightly at bedtime.  Continues to be followed by PCP.  Shortness of breath has been on hills and inclines.  She has also noticed weight gain with some fluid accumulation in her abdomen.  Unfortunately they have just found out she will be undergoing surgery. She is being scheduled for an echocardiogram as her last study was in 2021 with EF 55 to 60%.  Disposition: Follow up in 11-12 month(s) with Dr. [name] or APP. Patient advised that if any of her cardiac studies return abnormal she will be called to return for further discussion earlier than her 1 year follow up.  Shared Decision Making/Informed Consent   Shared Decision Making/Informed Consent The risks [chest pain, shortness of breath, cardiac arrhythmias, dizziness, blood pressure  fluctuations, myocardial infarction, stroke/transient ischemic attack, nausea, vomiting, allergic reaction, radiation exposure, metallic taste sensation and life-threatening complications (estimated to be 1 in 10,000)], benefits (risk stratification, diagnosing coronary artery disease, treatment guidance) and alternatives of a nuclear stress test were discussed in detail with Ms. Pelcher and she agrees to proceed.    Signed, Danasha Melman, NP  08/15/2021 11:13 AM    [facility]

## 2021-08-17 ENCOUNTER — Telehealth: Payer: Self-pay

## 2021-08-17 NOTE — Telephone Encounter (Signed)
[facility] calling to let Beckey Rutter know that the referral placed is out of network and the patient cannot be schedule. Can call [facility] back at [phone]

## 2021-08-18 ENCOUNTER — Telehealth: Payer: Self-pay

## 2021-08-18 NOTE — Telephone Encounter (Signed)
Patient called stating she seen her Cardiologist on 08/15/2021 and stated he wants her to get a echocardiogram and stress test done before her upcoming surgrey. Patient wants Lauren to let her know if these test are really needed.

## 2021-08-19 ENCOUNTER — Telehealth: Payer: Self-pay

## 2021-08-19 ENCOUNTER — Telehealth (INDEPENDENT_AMBULATORY_CARE_PROVIDER_SITE_OTHER): Payer: PPO | Admitting: Family Medicine

## 2021-08-19 DIAGNOSIS — Z5181 Encounter for therapeutic drug level monitoring: Secondary | ICD-10-CM

## 2021-08-19 DIAGNOSIS — F411 Generalized anxiety disorder: Secondary | ICD-10-CM | POA: Diagnosis not present

## 2021-08-19 DIAGNOSIS — R1900 Intra-abdominal and pelvic swelling, mass and lump, unspecified site: Secondary | ICD-10-CM | POA: Diagnosis not present

## 2021-08-19 DIAGNOSIS — F320 Major depressive disorder, single episode, mild: Secondary | ICD-10-CM | POA: Diagnosis not present

## 2021-08-19 NOTE — Progress Notes (Signed)
Name: [name]   MRN: [id number]    DOB: 13-Jun-1945   Date:08/19/2021       Progress Note  Subjective:    Chief Complaint  Chief Complaint  Patient presents with  . Follow-up    Dose change has helped, pt unable to give vital signs.  . Depression  . Anxiety   I connected with  [name] on 08/19/21 at  9:00 AM EDT by telephone and verified that I am speaking with the correct person using two identifiers.   I discussed the limitations, risks, security and privacy concerns of performing an evaluation and management service by telephone and the availability of in person appointments. Staff also discussed with the patient that there may be a patient responsible charge related to this service.  Patient verbalized understanding and agreed to proceed with encounter. Patient Location: home Provider Location: [facility] clinic office  Additional Individuals present:   HPI  F/up on lexapro dose change and moods      Patient Active Problem List   Diagnosis Date Noted  . Ovarian mass 08/10/2021  . PSVT (paroxysmal supraventricular tachycardia) ([place]) 08/14/2019  . Vitamin D deficiency 06/10/2019  . Current mild episode of major depressive disorder without prior episode ([place]) 10/15/2018  . GAD (generalized anxiety disorder) 10/15/2018  . Personal history of colonic polyps   . Obesity (BMI 30.0-34.9) 05/29/2018  . GERD (gastroesophageal reflux disease) 11/27/2017  . Osteopenia 11/27/2017  . Mixed hyperlipidemia 12/09/2014  . Insomnia due to stress 12/09/2014    Social History   Tobacco Use  . Smoking status: Former    Packs/day: 1.50    Years: 20.00    Total pack years: 30.00    Types: Cigarettes    Quit date: 1993    Years since quitting: 30.5    Passive exposure: Past  . Smokeless tobacco: Never  Substance Use Topics  . Alcohol use: Yes    Alcohol/week: 1.0 standard drink of alcohol    Types: 1 Glasses of wine per week    Comment: one glass of wine once a month      Current Outpatient Medications:  .  acetaminophen (TYLENOL) 325 MG tablet, Take 650 mg by mouth every 6 (six) hours as needed., Disp: , Rfl:  .  Ascorbic Acid (VITAMIN C) 1000 MG tablet, Take 1,000 mg by mouth daily., Disp: , Rfl:  .  calcium carbonate (OS-CAL - DOSED IN MG OF ELEMENTAL CALCIUM) 1250 (500 Ca) MG tablet, Take 1 tablet by mouth daily with breakfast., Disp: , Rfl:  .  escitalopram (LEXAPRO) 20 MG tablet, Take 1 tablet (20 mg total) by mouth daily., Disp: 90 tablet, Rfl: 3 .  famotidine (PEPCID) 10 MG tablet, Take 1 tablet (10 mg total) by mouth 2 (two) times daily as needed for heartburn or indigestion., Disp: , Rfl:  .  hydrOXYzine (ATARAX/VISTARIL) 25 MG tablet, TAKE 1-2 TABLETS (25-50 MG TOTAL) BY MOUTH AT BEDTIME AS NEEDED FOR ANXIETY (INSOMNIA)., Disp: 180 tablet, Rfl: 2 .  lovastatin (MEVACOR) 20 MG tablet, TAKE 1 TABLET BY MOUTH EVERYDAY AT BEDTIME, Disp: 90 tablet, Rfl: 3 .  melatonin 3 MG TABS tablet, Take 3 mg by mouth at bedtime., Disp: , Rfl:  .  meloxicam (MOBIC) 15 MG tablet, Take 1 tablet by mouth daily., Disp: , Rfl:  .  meloxicam (MOBIC) 15 MG tablet, Take 15 mg by mouth daily as needed for pain., Disp: , Rfl:  .  vitamin B-12 (CYANOCOBALAMIN) 1000 MCG tablet,  Take 1,000 mcg by mouth daily., Disp: , Rfl:   Allergies  Allergen Reactions  . Tetanus Toxoids Shortness Of Breath  . Sulfa Antibiotics     Severe vomiting    Chart Review: ***  Review of Systems   Objective:    Virtual encounter, vitals limited, only able to obtain the following There were no vitals filed for this visit. There is no height or weight on file to calculate BMI. Nursing Note and Vital Signs reviewed.  Physical Exam  PE limited by telephone encounter  No results found for this or any previous visit (from the past 72 hour(s)).  Assessment and Plan:     ICD-10-CM   1. Current mild episode of major depressive disorder without prior episode ([place])  F32.0     2. GAD  (generalized anxiety disorder)  F41.1     3. Encounter for medication monitoring  Z51.81       -Red flags and when to present for emergency care or RTC including but not limited to new/worsening/un-resolving symptoms, *** reviewed with patient at time of visit. Follow up and care instructions discussed and provided in AVS. - I discussed the assessment and treatment plan with the patient. The patient was provided an opportunity to ask questions and all were answered. The patient agreed with the plan and demonstrated an understanding of the instructions.  - The patient was advised to call back or seek an in-person evaluation if the symptoms worsen or if the condition fails to improve as anticipated.  I provided *** minutes of non-face-to-face time during this encounter.  [name], PA-C 08/19/21 9:47 AM

## 2021-08-19 NOTE — Telephone Encounter (Signed)
Patient informed about this and understands.

## 2021-08-19 NOTE — Telephone Encounter (Signed)
Patient called in stating she is unable to find out if she has an appointment today at [place] because she has check my chart and [facility] my chart and do not see anything about an appointment today at 2pm. Patient states "Lauren was under the assumption I had an appointment today." CALLBACK # [phone]

## 2021-08-22 ENCOUNTER — Encounter: Payer: Self-pay | Admitting: Family Medicine

## 2021-08-23 ENCOUNTER — Encounter
Admission: RE | Admit: 2021-08-23 | Discharge: 2021-08-23 | Disposition: A | Discharge status: Home / Self Care | Payer: PPO | Source: Ambulatory Visit | Attending: Medical | Admitting: Medical

## 2021-08-23 ENCOUNTER — Ambulatory Visit: Payer: PPO | Admitting: Family Medicine

## 2021-08-23 ENCOUNTER — Encounter: Payer: PPO | Admitting: Dermatology

## 2021-08-23 DIAGNOSIS — Z01810 Encounter for preprocedural cardiovascular examination: Secondary | ICD-10-CM | POA: Diagnosis not present

## 2021-08-23 LAB — NM MYOCAR MULTI W/SPECT W/WALL MOTION / EF
LV dias vol: 43 mL (ref 46–106)
LV sys vol: 12 mL
Nuc Stress EF: 72 %
Peak HR: 88 {beats}/min
Percent HR: 61 %
Rest HR: 59 {beats}/min
Rest Nuclear Isotope Dose: 10.2 mCi
SDS: 0
SRS: 3
SSS: 1
Stress Nuclear Isotope Dose: 32.9 mCi
TID: 1.13

## 2021-08-23 MED ORDER — TECHNETIUM TC 99M TETROFOSMIN IV KIT
31.5200 | PACK | Freq: Once | INTRAVENOUS | Status: AC | PRN
  Administered 2021-08-23: 31.52 via INTRAVENOUS

## 2021-08-23 MED ORDER — REGADENOSON 0.4 MG/5ML IV SOLN
0.4000 mg | Freq: Once | INTRAVENOUS | Status: AC
Start: 2021-08-23 — End: 2021-08-23
  Administered 2021-08-23: 0.4 mg via INTRAVENOUS

## 2021-08-23 MED ORDER — TECHNETIUM TC 99M TETROFOSMIN IV KIT
10.0000 | PACK | Freq: Once | INTRAVENOUS | Status: AC | PRN
  Administered 2021-08-23: 10.15 via INTRAVENOUS

## 2021-08-24 ENCOUNTER — Encounter: Payer: Self-pay | Admitting: Family Medicine

## 2021-08-24 ENCOUNTER — Ambulatory Visit (INDEPENDENT_AMBULATORY_CARE_PROVIDER_SITE_OTHER): Payer: PPO | Admitting: Family Medicine

## 2021-08-24 VITALS — BP 108/68 | HR 72 | Temp 97.5°F | Resp 16 | Ht 64.0 in

## 2021-08-24 DIAGNOSIS — R1900 Intra-abdominal and pelvic swelling, mass and lump, unspecified site: Secondary | ICD-10-CM

## 2021-08-24 NOTE — Progress Notes (Signed)
Name: [name]   MRN: [id number]    DOB: 03/27/1945   Date:08/24/2021       Progress Note  No chief complaint on file.    Subjective:   [name] is a 76 y.o. female, presents to clinic for consult after imaging showed mass - referred to specialists  Pelvic mass on CT and MRI She did cardiology consult yesterday and is waiting for eCHO and f/up appt and then to schedule surgery  Reviewed imaging results She is not having any recurrent UTI sx, some back pain, abd enlargement She is alittle anxious but doing well for now - just discussed at virtual appt 5 d ago    Current Outpatient Medications:    acetaminophen (TYLENOL) 325 MG tablet, Take 650 mg by mouth every 6 (six) hours as needed., Disp: , Rfl:    Ascorbic Acid (VITAMIN C) 1000 MG tablet, Take 1,000 mg by mouth daily., Disp: , Rfl:    calcium carbonate (OS-CAL - DOSED IN MG OF ELEMENTAL CALCIUM) 1250 (500 Ca) MG tablet, Take 1 tablet by mouth daily with breakfast., Disp: , Rfl:    escitalopram (LEXAPRO) 20 MG tablet, Take 1 tablet (20 mg total) by mouth daily., Disp: 90 tablet, Rfl: 3   famotidine (PEPCID) 10 MG tablet, Take 1 tablet (10 mg total) by mouth 2 (two) times daily as needed for heartburn or indigestion., Disp: , Rfl:    hydrOXYzine (ATARAX/VISTARIL) 25 MG tablet, TAKE 1-2 TABLETS (25-50 MG TOTAL) BY MOUTH AT BEDTIME AS NEEDED FOR ANXIETY (INSOMNIA)., Disp: 180 tablet, Rfl: 2   lovastatin (MEVACOR) 20 MG tablet, TAKE 1 TABLET BY MOUTH EVERYDAY AT BEDTIME, Disp: 90 tablet, Rfl: 3   melatonin 3 MG TABS tablet, Take 3 mg by mouth at bedtime., Disp: , Rfl:    meloxicam (MOBIC) 15 MG tablet, Take 1 tablet by mouth daily., Disp: , Rfl:    meloxicam (MOBIC) 15 MG tablet, Take 15 mg by mouth daily as needed for pain., Disp: , Rfl:    vitamin B-12 (CYANOCOBALAMIN) 1000 MCG tablet, Take 1,000 mcg by mouth daily., Disp: , Rfl:   Patient Active Problem List   Diagnosis Date Noted   Ovarian mass 08/10/2021   PSVT  (paroxysmal supraventricular tachycardia) ([place]) 08/14/2019   Vitamin D deficiency 06/10/2019   Current mild episode of major depressive disorder without prior episode ([place]) 10/15/2018   GAD (generalized anxiety disorder) 10/15/2018   Personal history of colonic polyps    Obesity (BMI 30.0-34.9) 05/29/2018   GERD (gastroesophageal reflux disease) 11/27/2017   Osteopenia 11/27/2017   Mixed hyperlipidemia 12/09/2014   Insomnia due to stress 12/09/2014    Past Surgical History:  Procedure Laterality Date   BREAST BIOPSY Right    core bx- neg   COLONOSCOPY     COLONOSCOPY WITH PROPOFOL N/A 10/08/2018   Procedure: COLONOSCOPY WITH PROPOFOL;  Surgeon: Lucilla Lame, MD;  Location: [facility] ENDOSCOPY;  Service: Endoscopy;  Laterality: N/A;   MOHS SURGERY     TONSILLECTOMY     VAGINAL HYSTERECTOMY      Family History  Problem Relation Age of Onset   Cancer Mother        lung    Diabetes Father    Parkinson's disease Father    Alzheimer's disease Father    Heart attack Father 87   Alcohol abuse Sister    Heart disease Sister    Lung cancer Sister    Down syndrome Sister    Colon cancer  Paternal Uncle    Rectal cancer Paternal Uncle    Stroke Paternal Grandmother    Cancer Paternal Grandfather        lung   Cancer Son        livcolon, liver stage 4   Liver cancer Son    Esophageal cancer Neg Hx    Stomach cancer Neg Hx    Breast cancer Neg Hx     Social History   Tobacco Use   Smoking status: Former    Packs/day: 1.50    Years: 20.00    Total pack years: 30.00    Types: Cigarettes    Quit date: 1993    Years since quitting: 30.5    Passive exposure: Past   Smokeless tobacco: Never  Vaping Use   Vaping Use: Never used  Substance Use Topics   Alcohol use: Yes    Alcohol/week: 1.0 standard drink of alcohol    Types: 1 Glasses of wine per week    Comment: one glass of wine once a month   Drug use: Never     Allergies  Allergen Reactions   Tetanus Toxoids Shortness  Of Breath   Sulfa Antibiotics     Severe vomiting    Health Maintenance  Topic Date Due   Zoster Vaccines- Shingrix (2 of 2) 08/10/2020   INFLUENZA VACCINE  09/13/2021   COLONOSCOPY (Pts 45-50yr Insurance coverage will need to be confirmed)  10/07/2021   MAMMOGRAM  07/20/2022   Pneumonia Vaccine 76 Years old  Completed   DEXA SCAN  Completed   COVID-19 Vaccine  Completed   Hepatitis C Screening  Completed   HPV VACCINES  Aged Out   TETANUS/TDAP  Discontinued    Chart Review Today: I personally reviewed active problem list, medication list, allergies, family history, social history, health maintenance, notes from last encounter, lab results, imaging with the patient/caregiver today.   Review of Systems  Constitutional: Negative.   HENT: Negative.    Eyes: Negative.   Respiratory: Negative.    Cardiovascular: Negative.   Gastrointestinal: Negative.   Endocrine: Negative.   Genitourinary: Negative.   Musculoskeletal: Negative.   Skin: Negative.   Allergic/Immunologic: Negative.   Neurological: Negative.   Hematological: Negative.   Psychiatric/Behavioral: Negative.    All other systems reviewed and are negative.    Objective:   There were no vitals filed for this visit.  There is no height or weight on file to calculate BMI.  Physical Exam Vitals and nursing note reviewed.  Constitutional:      General: She is not in acute distress.    Appearance: She is obese. She is not ill-appearing, toxic-appearing or diaphoretic.  HENT:     Head: Normocephalic and atraumatic.     Right Ear: External ear normal.     Left Ear: External ear normal.  Eyes:     General: No scleral icterus.       Right eye: No discharge.        Left eye: No discharge.     Conjunctiva/sclera: Conjunctivae normal.  Cardiovascular:     Rate and Rhythm: Normal rate.  Pulmonary:     Effort: Pulmonary effort is normal. No respiratory distress.  Skin:    General: Skin is warm and dry.   Neurological:     Mental Status: She is alert. Mental status is at baseline.  Psychiatric:        Mood and Affect: Mood normal.  Assessment & Plan:     ICD-10-CM   1. Pelvic mass  R19.00     Reviewed her imaging findings and appts coming up Discussed at prior appt w/in this week Pt just wanted to make sure she was going to the right specialists, reassured pt she is in great hands and the subspecialist at tertiary care center would be my choice for her for care.     [name], PA-C 08/24/21 1:07 PM

## 2021-09-01 ENCOUNTER — Encounter: Payer: Self-pay | Admitting: Family Medicine

## 2021-09-05 DIAGNOSIS — Z90710 Acquired absence of both cervix and uterus: Secondary | ICD-10-CM | POA: Diagnosis not present

## 2021-09-05 DIAGNOSIS — F418 Other specified anxiety disorders: Secondary | ICD-10-CM | POA: Diagnosis not present

## 2021-09-05 DIAGNOSIS — D1803 Hemangioma of intra-abdominal structures: Secondary | ICD-10-CM | POA: Diagnosis not present

## 2021-09-05 DIAGNOSIS — Z01818 Encounter for other preprocedural examination: Secondary | ICD-10-CM | POA: Diagnosis not present

## 2021-09-05 DIAGNOSIS — Z87440 Personal history of urinary (tract) infections: Secondary | ICD-10-CM | POA: Diagnosis not present

## 2021-09-05 DIAGNOSIS — R1900 Intra-abdominal and pelvic swelling, mass and lump, unspecified site: Secondary | ICD-10-CM | POA: Diagnosis not present

## 2021-09-05 DIAGNOSIS — C562 Malignant neoplasm of left ovary: Secondary | ICD-10-CM | POA: Diagnosis not present

## 2021-09-05 DIAGNOSIS — R2689 Other abnormalities of gait and mobility: Secondary | ICD-10-CM | POA: Diagnosis not present

## 2021-09-05 DIAGNOSIS — K219 Gastro-esophageal reflux disease without esophagitis: Secondary | ICD-10-CM | POA: Diagnosis not present

## 2021-09-05 DIAGNOSIS — N736 Female pelvic peritoneal adhesions (postinfective): Secondary | ICD-10-CM | POA: Diagnosis not present

## 2021-09-05 DIAGNOSIS — J4520 Mild intermittent asthma, uncomplicated: Secondary | ICD-10-CM | POA: Diagnosis not present

## 2021-09-05 DIAGNOSIS — K429 Umbilical hernia without obstruction or gangrene: Secondary | ICD-10-CM | POA: Diagnosis not present

## 2021-09-05 DIAGNOSIS — Z882 Allergy status to sulfonamides status: Secondary | ICD-10-CM | POA: Diagnosis not present

## 2021-09-05 DIAGNOSIS — Z79899 Other long term (current) drug therapy: Secondary | ICD-10-CM | POA: Diagnosis not present

## 2021-09-05 DIAGNOSIS — J45909 Unspecified asthma, uncomplicated: Secondary | ICD-10-CM | POA: Diagnosis not present

## 2021-09-05 DIAGNOSIS — K389 Disease of appendix, unspecified: Secondary | ICD-10-CM | POA: Diagnosis not present

## 2021-09-05 DIAGNOSIS — Z87891 Personal history of nicotine dependence: Secondary | ICD-10-CM | POA: Diagnosis not present

## 2021-09-05 DIAGNOSIS — Z5331 Laparoscopic surgical procedure converted to open procedure: Secondary | ICD-10-CM | POA: Diagnosis not present

## 2021-09-05 DIAGNOSIS — R971 Elevated cancer antigen 125 [CA 125]: Secondary | ICD-10-CM | POA: Diagnosis not present

## 2021-09-05 DIAGNOSIS — C579 Malignant neoplasm of female genital organ, unspecified: Secondary | ICD-10-CM | POA: Diagnosis not present

## 2021-09-05 DIAGNOSIS — E785 Hyperlipidemia, unspecified: Secondary | ICD-10-CM | POA: Diagnosis not present

## 2021-09-05 DIAGNOSIS — I471 Supraventricular tachycardia: Secondary | ICD-10-CM | POA: Diagnosis not present

## 2021-09-05 DIAGNOSIS — F419 Anxiety disorder, unspecified: Secondary | ICD-10-CM | POA: Diagnosis not present

## 2021-09-05 DIAGNOSIS — F32A Depression, unspecified: Secondary | ICD-10-CM | POA: Diagnosis not present

## 2021-09-05 DIAGNOSIS — Z887 Allergy status to serum and vaccine status: Secondary | ICD-10-CM | POA: Diagnosis not present

## 2021-09-06 DIAGNOSIS — J4520 Mild intermittent asthma, uncomplicated: Secondary | ICD-10-CM | POA: Diagnosis not present

## 2021-09-06 DIAGNOSIS — F32A Depression, unspecified: Secondary | ICD-10-CM | POA: Insufficient documentation

## 2021-09-06 DIAGNOSIS — K219 Gastro-esophageal reflux disease without esophagitis: Secondary | ICD-10-CM | POA: Diagnosis not present

## 2021-09-06 DIAGNOSIS — Z01818 Encounter for other preprocedural examination: Secondary | ICD-10-CM | POA: Diagnosis not present

## 2021-09-06 DIAGNOSIS — R1900 Intra-abdominal and pelvic swelling, mass and lump, unspecified site: Secondary | ICD-10-CM | POA: Diagnosis not present

## 2021-09-06 DIAGNOSIS — I471 Supraventricular tachycardia: Secondary | ICD-10-CM | POA: Diagnosis not present

## 2021-09-07 ENCOUNTER — Ambulatory Visit: Payer: PPO | Admitting: Dermatology

## 2021-09-07 ENCOUNTER — Other Ambulatory Visit: Payer: Self-pay | Admitting: Nurse Practitioner

## 2021-09-07 MED ORDER — LORAZEPAM 0.5 MG PO TABS: 0.5000 mg | ORAL_TABLET | Freq: Four times a day (QID) | ORAL | 0 refills | Status: DC | PRN

## 2021-09-07 NOTE — Progress Notes (Signed)
Called patient with prescription for ativan d/t anxiety and fear in anticipation of her surgery tomorrow with Dr. Theora Gianotti at [facility].

## 2021-09-08 DIAGNOSIS — K219 Gastro-esophageal reflux disease without esophagitis: Secondary | ICD-10-CM | POA: Diagnosis not present

## 2021-09-08 DIAGNOSIS — R971 Elevated cancer antigen 125 [CA 125]: Secondary | ICD-10-CM | POA: Diagnosis not present

## 2021-09-08 DIAGNOSIS — F418 Other specified anxiety disorders: Secondary | ICD-10-CM | POA: Diagnosis not present

## 2021-09-08 DIAGNOSIS — C579 Malignant neoplasm of female genital organ, unspecified: Secondary | ICD-10-CM | POA: Diagnosis not present

## 2021-09-08 DIAGNOSIS — R2689 Other abnormalities of gait and mobility: Secondary | ICD-10-CM | POA: Diagnosis not present

## 2021-09-08 DIAGNOSIS — J45909 Unspecified asthma, uncomplicated: Secondary | ICD-10-CM | POA: Diagnosis not present

## 2021-09-08 DIAGNOSIS — R1900 Intra-abdominal and pelvic swelling, mass and lump, unspecified site: Secondary | ICD-10-CM | POA: Diagnosis not present

## 2021-09-08 DIAGNOSIS — C562 Malignant neoplasm of left ovary: Secondary | ICD-10-CM | POA: Diagnosis not present

## 2021-09-08 HISTORY — PX: OTHER SURGICAL HISTORY: SHX169

## 2021-09-12 ENCOUNTER — Other Ambulatory Visit: Payer: Self-pay

## 2021-09-12 DIAGNOSIS — C569 Malignant neoplasm of unspecified ovary: Secondary | ICD-10-CM

## 2021-09-14 ENCOUNTER — Other Ambulatory Visit: Payer: PPO

## 2021-09-19 ENCOUNTER — Other Ambulatory Visit: Payer: PPO | Admitting: Urology

## 2021-09-28 ENCOUNTER — Inpatient Hospital Stay: Payer: PPO | Attending: Obstetrics and Gynecology | Admitting: Internal Medicine

## 2021-09-28 ENCOUNTER — Encounter: Payer: Self-pay | Admitting: Internal Medicine

## 2021-09-28 ENCOUNTER — Inpatient Hospital Stay (HOSPITAL_BASED_OUTPATIENT_CLINIC_OR_DEPARTMENT_OTHER): Payer: PPO | Admitting: Obstetrics and Gynecology

## 2021-09-28 ENCOUNTER — Inpatient Hospital Stay: Payer: PPO

## 2021-09-28 VITALS — BP 127/64 | HR 68 | Temp 98.7°F | Resp 20 | Wt 175.4 lb

## 2021-09-28 DIAGNOSIS — Z801 Family history of malignant neoplasm of trachea, bronchus and lung: Secondary | ICD-10-CM | POA: Diagnosis not present

## 2021-09-28 DIAGNOSIS — E8809 Other disorders of plasma-protein metabolism, not elsewhere classified: Secondary | ICD-10-CM | POA: Diagnosis not present

## 2021-09-28 DIAGNOSIS — Z808 Family history of malignant neoplasm of other organs or systems: Secondary | ICD-10-CM | POA: Diagnosis not present

## 2021-09-28 DIAGNOSIS — Z5111 Encounter for antineoplastic chemotherapy: Secondary | ICD-10-CM | POA: Insufficient documentation

## 2021-09-28 DIAGNOSIS — C569 Malignant neoplasm of unspecified ovary: Secondary | ICD-10-CM

## 2021-09-28 DIAGNOSIS — Z87891 Personal history of nicotine dependence: Secondary | ICD-10-CM | POA: Insufficient documentation

## 2021-09-28 DIAGNOSIS — C562 Malignant neoplasm of left ovary: Secondary | ICD-10-CM

## 2021-09-28 DIAGNOSIS — Z800 Family history of malignant neoplasm of digestive organs: Secondary | ICD-10-CM | POA: Diagnosis not present

## 2021-09-28 DIAGNOSIS — D72829 Elevated white blood cell count, unspecified: Secondary | ICD-10-CM | POA: Diagnosis not present

## 2021-09-28 DIAGNOSIS — J45909 Unspecified asthma, uncomplicated: Secondary | ICD-10-CM | POA: Insufficient documentation

## 2021-09-28 DIAGNOSIS — Z90710 Acquired absence of both cervix and uterus: Secondary | ICD-10-CM | POA: Diagnosis not present

## 2021-09-28 DIAGNOSIS — D649 Anemia, unspecified: Secondary | ICD-10-CM | POA: Diagnosis not present

## 2021-09-28 DIAGNOSIS — Z85828 Personal history of other malignant neoplasm of skin: Secondary | ICD-10-CM | POA: Diagnosis not present

## 2021-09-28 LAB — COMPREHENSIVE METABOLIC PANEL
ALT: 22 U/L (ref 0–44)
AST: 21 U/L (ref 15–41)
Albumin: 3.8 g/dL (ref 3.5–5.0)
Alkaline Phosphatase: 57 U/L (ref 38–126)
Anion gap: 8 (ref 5–15)
BUN: 14 mg/dL (ref 8–23)
CO2: 26 mmol/L (ref 22–32)
Calcium: 8.6 mg/dL — ABNORMAL LOW (ref 8.9–10.3)
Chloride: 102 mmol/L (ref 98–111)
Creatinine, Ser: 0.96 mg/dL (ref 0.44–1.00)
GFR, Estimated: >60 mL/min (ref >60)
Glucose, Bld: 111 mg/dL — ABNORMAL HIGH (ref 70–99)
Potassium: 3.9 mmol/L (ref 3.5–5.1)
Sodium: 136 mmol/L (ref 135–145)
Total Bilirubin: 0.4 mg/dL (ref 0.3–1.2)
Total Protein: 6.4 g/dL — ABNORMAL LOW (ref 6.5–8.1)

## 2021-09-28 LAB — CBC WITH DIFFERENTIAL/PLATELET
Abs Immature Granulocytes: 0.03 10*3/uL (ref 0.00–0.07)
Basophils Absolute: 0.1 10*3/uL (ref 0.0–0.1)
Basophils Relative: 1 %
Eosinophils Absolute: 0.3 10*3/uL (ref 0.0–0.5)
Eosinophils Relative: 3 %
HCT: 39.4 % (ref 36.0–46.0)
Hemoglobin: 13.3 g/dL (ref 12.0–15.0)
Immature Granulocytes: 0 %
Lymphocytes Relative: 51 %
Lymphs Abs: 5.9 10*3/uL — ABNORMAL HIGH (ref 0.7–4.0)
MCH: 31.3 pg (ref 26.0–34.0)
MCHC: 33.8 g/dL (ref 30.0–36.0)
MCV: 92.7 fL (ref 80.0–100.0)
Monocytes Absolute: 0.5 10*3/uL (ref 0.1–1.0)
Monocytes Relative: 5 %
Neutro Abs: 4.5 10*3/uL (ref 1.7–7.7)
Neutrophils Relative %: 40 %
Platelets: 272 10*3/uL (ref 150–400)
RBC: 4.25 MIL/uL (ref 3.87–5.11)
RDW: 13.4 % (ref 11.5–15.5)
Smear Review: NORMAL
WBC: 11.3 10*3/uL — ABNORMAL HIGH (ref 4.0–10.5)
nRBC: 0 % (ref 0.0–0.2)

## 2021-09-28 NOTE — Progress Notes (Signed)
START ON PATHWAY REGIMEN - Ovarian     A cycle is every 21 days:     Paclitaxel      Carboplatin   **Always confirm dose/schedule in your pharmacy ordering system**  Patient Characteristics: Postoperative without Neoadjuvant Therapy (Pathologic Staging), Newly Diagnosed, Adjuvant Therapy, Any Stage I, Grade 3 BRCA Mutation Status: Awaiting Test Results Therapeutic Status: Postoperative without Neoadjuvant Therapy (Pathologic Staging) AJCC 8 Stage Grouping: IC AJCC M Category: cM0 AJCC T Category: pT1c1 AJCC N Category: pN0 Tumor Grade: 3 Intent of Therapy: Curative Intent, Discussed with Patient

## 2021-09-28 NOTE — Assessment & Plan Note (Addendum)
#  High-grade serous ovarian cancer-left side-pT1c [stage Ic-spillage during surgery].  Reviewed the staging and pathology with the patient in detail.    # Discussed the role of adjuvant chemotherapy with CarboTaxol every 3 weeks x 6 cycles.  Discussed the goal of therapy is cure.   # I reviewed at length the individual components with chemotherapy; and the schedule in detail.  I also discussed the potential side effects including but not limited to-increasing fatigue, nausea vomiting, diarrhea, hair loss, sores in the mouth, increase risk of infection and also neuropathy.  Also reviewed the multiple strategies to avoid/mitigate similar side effects including preemptive medications-for nausea vomiting. Growth factor- would be given as prophylaxis for chemotherapy-induced neutropenia to prevent febrile neutropenias.   # IV access: Discussed re: port placement; HOLD off port placement.   # Chemotherapy education; . Hopefully the planned start chemotherapy next 2 weeks. Antiemetics-Zofran and Compazine.  Sent to pharmacy.  Thank you Dr.Secord for allowing me to participate in the care of your pleasant patient. Please do not hesitate to contact me with questions or concerns in the interim.  Discussed with Dr. Theora Gianotti.  # DISPOSITION: # labs today- order cbc/cmp; ca-125  # Add chemo ed # follow up in 2 weeks- MD; labs- cbc/cmp; CARBO-TAXOL [q3 w-NEW]- Dr.B

## 2021-09-28 NOTE — Progress Notes (Signed)
[place] NOTE  Patient Care Team: [name], PA-C as PCP - General (Family Medicine) End, Harrell Gave, MD as PCP - Cardiology (Cardiology) Ralene Bathe, MD (Dermatology) Arelia Sneddon, [place] (Optometry) Vladimir Crofts, MD as Consulting Physician (Neurology) Beverly Gust, MD (Otolaryngology) Clent Jacks, RN as Oncology Nurse Navigator  CHIEF COMPLAINTS/PURPOSE OF CONSULTATION: Ovarian cancer.   Oncology History Overview Note  s/p prior hysterectomy (ovaries in situ); Dr. Matilde Sprang for ovarian mass.   incontinence.MRI 08/04/21- Reproductive: Large cystic and solid pelvic mass centered in the pelvis. Ovarian tissue or ovary is not demonstrated on the current study. Mass in total measuring 15 x 11 x 12 cm. Soft tissue component showing restricted diffusion and enhancement centered in the lower portion of the mass shows heterogeneous T2 signal and measures 10.8 x 6.8 x 8.5 cm amidst cystic areas. There is a small amount of associated ascites. The mass abuts the vaginal apex following hysterectomy, potentially involving the LEFT vaginal apex. The "soft tissue component displays very heterogeneous enhancement and there are numerous enhancing septations throughout. Collateral pathways from LEFT ovarian vein are noted about the LEFT lateral aspect of the mass. Given that the gonadal vein can be traced to the mass suspicion is for mass of LEFT ovarian origin though this may of parasitized flow from ovarian. 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 Theora Gianotti; pelvic washings negative]   # AUG 30th, 2023- Carbo-taxol; with GCSF   # PSVT [Dr.Gollan] on metoprolol   Ovarian cancer on left ([place])  09/28/2021 Initial Diagnosis   Ovarian cancer on left ([place])   09/28/2021 Cancer Staging   Staging  form: Ovary, Fallopian Tube, and Primary Peritoneal Carcinoma, AJCC 8th Edition - Clinical: Stage IC (cT1c1, cN0, cM0) - Signed by Cammie Sickle, MD on 09/28/2021   10/12/2021 -  Chemotherapy   Patient is on Treatment Plan : OVARIAN Carboplatin (AUC 6) + Paclitaxel (175) q21d X 6 Cycles        HISTORY OF PRESENTING ILLNESS: Ambulating independently.  Accompanied by husband.  [name] 76 y.o.  female with no significant medical problems-has been referred to [place] for further evaluation recommendations for newly diagnosed ovarian cancer.  Patient presented to urology for urinary incontinence.  The patient on further work-up noted to have a left-sided adnexal mass suspicious for ovarian cancer.  The patient underwent further imaging including MRI-and finally underwent surgery at [facility].  Patient is recovered well from surgery.  Denies any worsening pain.  Review of Systems  Constitutional:  Negative for chills, diaphoresis, fever, malaise/fatigue and weight loss.  HENT:  Negative for nosebleeds and sore throat.   Eyes:  Negative for double vision.  Respiratory:  Negative for cough, hemoptysis, sputum production, shortness of breath and wheezing.   Cardiovascular:  Negative for chest pain, palpitations, orthopnea and leg swelling.  Gastrointestinal:  Negative for abdominal pain, blood in stool, constipation, diarrhea, heartburn, melena, nausea and vomiting.  Genitourinary:  Negative for dysuria, frequency and urgency.  Musculoskeletal:  Negative for back pain and joint pain.  Skin: Negative.  Negative for itching and rash.  Neurological:  Negative for dizziness, tingling, focal weakness, weakness and headaches.  Endo/Heme/Allergies:  Does not bruise/bleed easily.  Psychiatric/Behavioral:  Negative for depression. The patient is not nervous/anxious and does not have insomnia.  MEDICAL HISTORY:  Past Medical History:  Diagnosis Date   Allergy    Arthritis    Asthma     Atypical chest pain 03/14/2019   Atypical mole 06/12/2018   right prox lat thigh/mod   Atypical mole 05/19/2014   left sup med buttock/excision   Atypical mole 03/26/2014   right sup lat buttock   Atypical mole 03/26/2013   left lat mid back   Atypical mole 11/08/2006   right shoulder superior/excision   Basal cell carcinoma 06/12/2016   right medial sup canthus below eyebrow   Basal cell carcinoma 09/23/2014   left nasal bridge   Basal cell carcinoma 03/26/2013   left nasal sidewall   Basal cell carcinoma 08/27/2012   left nasal dorsum   Cancer ([facility])    shoulder, nose skin CA- basal cell CA   GERD (gastroesophageal reflux disease)    occasional   Hyperlipidemia    Osteopenia    Polyp of transverse colon    Right arm weakness 03/14/2019   Stress due to illness of family member 05/29/2018    SURGICAL HISTORY: Past Surgical History:  Procedure Laterality Date   BREAST BIOPSY Right    core bx- neg   COLONOSCOPY     COLONOSCOPY WITH PROPOFOL N/A 10/08/2018   Procedure: COLONOSCOPY WITH PROPOFOL;  Surgeon: Lucilla Lame, MD;  Location: [facility] ENDOSCOPY;  Service: Endoscopy;  Laterality: N/A;   MOHS SURGERY     TONSILLECTOMY     VAGINAL HYSTERECTOMY      SOCIAL HISTORY: Social History   Socioeconomic History   Marital status: Married    Spouse name: [name]   Number of children: 2   Years of education: Not on file   Highest education level: Bachelor's degree (e.g., BA, AB, BS)  Occupational History   Occupation: Retired  Tobacco Use   Smoking status: Former    Packs/day: 1.50    Years: 20.00    Total pack years: 30.00    Types: Cigarettes    Quit date: 1993    Years since quitting: 30.6    Passive exposure: Past   Smokeless tobacco: Never  Vaping Use   Vaping Use: Never used  Substance and Sexual Activity   Alcohol use: Yes    Alcohol/week: 1.0 standard drink of alcohol    Types: 1 Glasses of wine per week    Comment: one glass of wine once a month   Drug use:  Never   Sexual activity: Not Currently  Other Topics Concern   Not on file  Social History Narrative   Lives with husband in [place]; quit smoking > 30 years; once every 1-2 months; retd. Teacher- elementary school;    Social Determinants of Health   Financial Resource Strain: Low Risk  (03/03/2021)   Overall Financial Resource Strain (CARDIA)    Difficulty of Paying Living Expenses: Not hard at all  Food Insecurity: No Food Insecurity (03/03/2021)   Hunger Vital Sign    Worried About Running Out of Food in the Last Year: Never true    Ran Out of Food in the Last Year: Never true  Transportation Needs: No Transportation Needs (03/03/2021)   PRAPARE - Hydrologist (Medical): No    Lack of Transportation (Non-Medical): No  Physical Activity: Sufficiently Active (03/03/2021)   Exercise Vital Sign    Days of Exercise per Week: 7 days    Minutes of Exercise per Session: 30 min  Stress: No Stress Concern Present (03/03/2021)  [employer] - Occupational Stress Questionnaire    Feeling of Stress : Not at all  Social Connections: Moderately Integrated (03/03/2021)   Social Connection and Isolation Panel [NHANES]    Frequency of Communication with Friends and Family: More than three times a week    Frequency of Social Gatherings with Friends and Family: Three times a week    Attends Religious Services: More than 4 times per year    Active Member of Clubs or Organizations: No    Attends Archivist Meetings: Never    Marital Status: Married  Human resources officer Violence: Not At Risk (03/03/2021)   Humiliation, Afraid, Rape, and Kick questionnaire    Fear of Current or Ex-Partner: No    Emotionally Abused: No    Physically Abused: No    Sexually Abused: No    FAMILY HISTORY: Family History  Problem Relation Age of Onset   Lung cancer Mother    Diabetes Father    Parkinson's disease Father    Alzheimer's disease Father     Heart attack Father 95   Alcohol abuse Sister    Heart disease Sister    Lung cancer Sister    Down syndrome Sister    Colon cancer Paternal Uncle    Rectal cancer Paternal Uncle    Stroke Paternal Grandmother    Cancer Paternal Grandfather        lung   Cancer Son        livcolon, liver stage 4   Liver cancer Son    Esophageal cancer Neg Hx    Stomach cancer Neg Hx    Breast cancer Neg Hx     ALLERGIES:  is allergic to tetanus toxoids and sulfa antibiotics.  MEDICATIONS:  Current Outpatient Medications  Medication Sig Dispense Refill   acetaminophen (TYLENOL) 325 MG tablet Take 650 mg by mouth every 6 (six) hours as needed.     Ascorbic Acid (VITAMIN C) 1000 MG tablet Take 1,000 mg by mouth daily.     calcium carbonate (OS-CAL - DOSED IN MG OF ELEMENTAL CALCIUM) 1250 (500 Ca) MG tablet Take 1 tablet by mouth daily with breakfast.     enoxaparin (LOVENOX) 40 MG/0.4ML injection Inject into the skin.     escitalopram (LEXAPRO) 20 MG tablet Take 1 tablet (20 mg total) by mouth daily. 90 tablet 3   famotidine (PEPCID) 10 MG tablet Take 1 tablet (10 mg total) by mouth 2 (two) times daily as needed for heartburn or indigestion.     hydrOXYzine (ATARAX/VISTARIL) 25 MG tablet TAKE 1-2 TABLETS (25-50 MG TOTAL) BY MOUTH AT BEDTIME AS NEEDED FOR ANXIETY (INSOMNIA). 180 tablet 2   LORazepam (ATIVAN) 0.5 MG tablet Take 1 tablet (0.5 mg total) by mouth every 6 (six) hours as needed for anxiety. 3 tablet 0   lovastatin (MEVACOR) 20 MG tablet TAKE 1 TABLET BY MOUTH EVERYDAY AT BEDTIME 90 tablet 3   melatonin 3 MG TABS tablet Take 3 mg by mouth at bedtime.     metoprolol succinate (TOPROL-XL) 25 MG 24 hr tablet Take by mouth.     omeprazole (PRILOSEC OTC) 20 MG tablet Take by mouth.     meloxicam (MOBIC) 15 MG tablet Take 1 tablet by mouth daily.     meloxicam (MOBIC) 15 MG tablet Take 15 mg by mouth daily as needed for pain.     vitamin B-12 (CYANOCOBALAMIN) 1000 MCG tablet Take 1,000 mcg by  mouth daily.     No  current facility-administered medications for this visit.    PHYSICAL EXAMINATION: ECOG PERFORMANCE STATUS: 1 - Symptomatic but completely ambulatory  There were no vitals filed for this visit. There were no vitals filed for this visit.  Physical Exam Vitals and nursing note reviewed.  HENT:     Head: Normocephalic and atraumatic.     Mouth/Throat:     Pharynx: Oropharynx is clear.  Eyes:     Extraocular Movements: Extraocular movements intact.     Pupils: Pupils are equal, round, and reactive to light.  Cardiovascular:     Rate and Rhythm: Normal rate and regular rhythm.  Pulmonary:     Comments: Decreased breath sounds bilaterally.  Abdominal:     Palpations: Abdomen is soft.  Musculoskeletal:        General: Normal range of motion.     Cervical back: Normal range of motion.  Skin:    General: Skin is warm.  Neurological:     General: No focal deficit present.     Mental Status: She is alert and oriented to person, place, and time.  Psychiatric:        Behavior: Behavior normal.        Judgment: Judgment normal.     LABORATORY DATA:  I have reviewed the data as listed Lab Results  Component Value Date   WBC 11.3 (H) 09/28/2021   HGB 13.3 09/28/2021   HCT 39.4 09/28/2021   MCV 92.7 09/28/2021   PLT 272 09/28/2021   Recent Labs    05/16/21 0834 08/10/21 1203 09/28/21 1428  NA 141 139 136  K 4.2 4.3 3.9  CL 105 107 102  CO2 '29 28 26  '$ GLUCOSE 105* 90 111*  BUN '10 15 14  '$ CREATININE 0.95 1.00 0.96  CALCIUM 9.3 8.1* 8.6*  GFRNONAA  --  58* >60  PROT 6.0* 6.4* 6.4*  ALBUMIN  --  3.9 3.8  AST '16 18 21  '$ ALT '14 15 22  '$ ALKPHOS  --  54 57  BILITOT 0.5 0.5 0.4    RADIOGRAPHIC STUDIES: I have personally reviewed the radiological images as listed and agreed with the findings in the report. No results found.   Ovarian cancer on left [facility]) #High-grade serous ovarian cancer-left side-pT1c [stage Ic-spillage during surgery].  Reviewed  the staging and pathology with the patient in detail.    # Discussed the role of adjuvant chemotherapy with CarboTaxol every 3 weeks x 6 cycles.  Discussed the goal of therapy is cure.   # I reviewed at length the individual components with chemotherapy; and the schedule in detail.  I also discussed the potential side effects including but not limited to-increasing fatigue, nausea vomiting, diarrhea, hair loss, sores in the mouth, increase risk of infection and also neuropathy.  Also reviewed the multiple strategies to avoid/mitigate similar side effects including preemptive medications-for nausea vomiting. Growth factor- would be given as prophylaxis for chemotherapy-induced neutropenia to prevent febrile neutropenias.   # IV access: Discussed re: port placement; HOLD off port placement.   # Chemotherapy education; . Hopefully the planned start chemotherapy next 2 weeks. Antiemetics-Zofran and Compazine.  Sent to pharmacy.  Thank you Dr.Secord for allowing me to participate in the care of your pleasant patient. Please do not hesitate to contact me with questions or concerns in the interim.  Discussed with Dr. Theora Gianotti.  # DISPOSITION: # labs today- order cbc/cmp; ca-125  # Add chemo ed # follow up in 2 weeks- MD; labs- cbc/cmp; CARBO-TAXOL [q3 w-NEW]-  Dr.B     # PAIN CONTROL  # GENETICS:  # CLINICAL TRIALS:  # DISPOSITION: # labs today- ordered # Follow up-   Thank you Dr. for allowing me to participate in the care of your pleasant patient. Please do not hesitate to contact me with questions or concerns in the interim.  Above plan of care was discussed with patient/family in detail.  My contact information was given to the patient/family.     Cammie Sickle, MD 09/28/2021 4:09 PM

## 2021-09-28 NOTE — Progress Notes (Signed)
Gynecologic Oncology Interval Visit   Referring Provider: Dr. Matilde Sprang  Chief Complaint: Ovarian Mass  Subjective:  [name] is a 76 y.o. female s/p prior hysterectomy (ovaries in situ) who is seen in consultation from Dr. Matilde Sprang for ovarian mass.    09/08/2021  Diagnostic laparoscopy, exploratory laparotomy, bilateral salpingo-oophorectomy, infracolic omentectomy, peritoneal biopsies, appendectomy with partial cecectomy, adhesiolysis (including enterolysis and ovariolysis), and posterior rectus sheath blocks    She has done well since surgery and has no significant complaints.  She continues on Lovenox.  A. 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 Negative for surface involvement. Bilateral fallopian tubes: Negative for carcinoma.   See synoptic report.   Comment: The tumor has an unusual microcystic morphology, but immunohistochemical studies are most consistent with a high-grade serous carcinoma.   B.  Right ovary, oophorectomy: Right ovary with no pathologic diagnosis.  Negative for carcinoma.   C. Omentum, omentectomy: Benign fibroadipose tissue. Negative for carcinoma.   D. Left paracolic gutter, biopsy: Benign fibroadipose tissue and skeletal muscle. Negative for carcinoma.   E. Left pelvic, biopsy: Benign fibroadipose tissue and smooth muscle. Negative for carcinoma.   F. Anterior cul de sac, biopsy: Benign fibroadipose tissue and smooth muscle. Negative for carcinoma.   G. Right pelvic, biopsy: Benign fibrous tissue and smooth muscle. Negative for carcinoma.   H. Posterior cul de sac, biopsy: Benign fibrous tissue. Negative for carcinoma.   I. Appendix and portion of cecum, appendectomy and partial cecectomy: Appendix and portion of cecum with no pathologic diagnosis. Negative for carcinoma.  Pelvic washings - negative  for malignancy   Gynecologic Oncologic History [name] is a pleasant female s/p prior hysterectomy (ovaries in situ) who is seen in consultation from Dr. Matilde Sprang for ovarian mass.   Patient presented to Urology for incontinence. She had elevated residual urine and ultrasound was performed which showed 12.2 x 10.1 x 2.9 cm mixed cystic and solid mass suspicious for ovarian mass.   MRI 08/04/21- Reproductive: Large cystic and solid pelvic mass centered in the pelvis. Ovarian tissue or ovary is not demonstrated on the current study. Mass in total measuring 15 x 11 x 12 cm. Soft tissue component showing restricted diffusion and enhancement centered in the lower portion of the mass shows heterogeneous T2 signal and measures 10.8 x 6.8 x 8.5 cm amidst cystic areas. There is a small amount of associated ascites. The mass abuts the vaginal apex following hysterectomy, potentially involving the LEFT vaginal apex. The "soft tissue component displays very heterogeneous enhancement and there are numerous enhancing septations throughout. Collateral pathways from LEFT ovarian vein are noted about the LEFT lateral aspect of the mass. Given that the gonadal vein can be traced to the mass suspicion is for mass of LEFT ovarian origin though this may of parasitized flow from ovarian. Vessels on the LEFT   Other: Trace ascites. No discrete peritoneal nodularity about the pelvis.   Musculoskeletal: No suspicious bone lesion   IMPRESSION: 1. Large cystic and solid pelvic mass highly suspicious for neoplasm. Tumor may arise from the LEFT ovary based on proximity to LEFT ovarian vein though no defined normal ovarian tissue can be seen on either the RIGHT or the LEFT on today's exam. Correlate with surgical history. Sarcoma is also considered based on the marked heterogeneity seen within the lesion on the current study. There is strong restricted diffusion within and avid enhancement of  the large central soft tissue  component. Gyn consultation gyn consultation is suggested. 2. Trace ascites. 3. Tumor in close proximity to sigmoid colon without definitive signs of involvement also in close proximity to the urinary bladder with defined plane between urinary bladder and mass. Mass may involve the LEFT vaginal fornix. 4. Small amount of associated ascites.  She has history of hysterectomy at age 75 for endometriosis.   We recommended surgical management.   Genetic Risk Assessment: Genetic testing needed. Molecular Tumor testing: Testing to be determined.   Problem List: Patient Active Problem List   Diagnosis Date Noted   Ovarian mass 08/10/2021   PSVT (paroxysmal supraventricular tachycardia) ([place]) 08/14/2019   Vitamin D deficiency 06/10/2019   Current mild episode of major depressive disorder without prior episode ([place]) 10/15/2018   GAD (generalized anxiety disorder) 10/15/2018   Personal history of colonic polyps    Obesity (BMI 30.0-34.9) 05/29/2018   GERD (gastroesophageal reflux disease) 11/27/2017   Osteopenia 11/27/2017   Mixed hyperlipidemia 12/09/2014   Insomnia due to stress 12/09/2014    Past Medical History: Past Medical History:  Diagnosis Date   Allergy    Arthritis    Asthma    Atypical chest pain 03/14/2019   Atypical mole 06/12/2018   right prox lat thigh/mod   Atypical mole 05/19/2014   left sup med buttock/excision   Atypical mole 03/26/2014   right sup lat buttock   Atypical mole 03/26/2013   left lat mid back   Atypical mole 11/08/2006   right shoulder superior/excision   Basal cell carcinoma 06/12/2016   right medial sup canthus below eyebrow   Basal cell carcinoma 09/23/2014   left nasal bridge   Basal cell carcinoma 03/26/2013   left nasal sidewall   Basal cell carcinoma 08/27/2012   left nasal dorsum   Cancer (HCC)    shoulder, nose skin CA- basal cell CA   GERD (gastroesophageal reflux disease)    occasional   Hyperlipidemia    Osteopenia     Polyp of transverse colon    Right arm weakness 03/14/2019   Stress due to illness of family member 05/29/2018    Past Surgical History: Past Surgical History:  Procedure Laterality Date   BREAST BIOPSY Right    core bx- neg   COLONOSCOPY     COLONOSCOPY WITH PROPOFOL N/A 10/08/2018   Procedure: COLONOSCOPY WITH PROPOFOL;  Surgeon: Lucilla Lame, MD;  Location: [facility] ENDOSCOPY;  Service: Endoscopy;  Laterality: N/A;   MOHS SURGERY     TONSILLECTOMY     VAGINAL HYSTERECTOMY      Past Gynecologic History:  Menarche: age 23 Hysterectomy at age 4.    OB History:  OB History  No obstetric history on file.    Family History: Family History  Problem Relation Age of Onset   Cancer Mother        lung    Diabetes Father    Parkinson's disease Father    Alzheimer's disease Father    Heart attack Father 47   Alcohol abuse Sister    Heart disease Sister    Lung cancer Sister    Down syndrome Sister    Colon cancer Paternal Uncle    Rectal cancer Paternal Uncle    Stroke Paternal Grandmother    Cancer Paternal Grandfather        lung   Cancer Son        livcolon, liver stage 4   Liver cancer Son    Esophageal cancer  Neg Hx    Stomach cancer Neg Hx    Breast cancer Neg Hx     Social History: Social History   Socioeconomic History   Marital status: Married    Spouse name: [name]   Number of children: 2   Years of education: Not on file   Highest education level: Bachelor's degree (e.g., BA, AB, BS)  Occupational History   Occupation: Retired  Tobacco Use   Smoking status: Former    Packs/day: 1.50    Years: 20.00    Total pack years: 30.00    Types: Cigarettes    Quit date: 1993    Years since quitting: 30.6    Passive exposure: Past   Smokeless tobacco: Never  Vaping Use   Vaping Use: Never used  Substance and Sexual Activity   Alcohol use: Yes    Alcohol/week: 1.0 standard drink of alcohol    Types: 1 Glasses of wine per week    Comment: one glass of wine  once a month   Drug use: Never   Sexual activity: Not Currently  Other Topics Concern   Not on file  Social History Narrative   Not on file   Social Determinants of Health   Financial Resource Strain: Low Risk  (03/03/2021)   Overall Financial Resource Strain (CARDIA)    Difficulty of Paying Living Expenses: Not hard at all  Food Insecurity: No Food Insecurity (03/03/2021)   Hunger Vital Sign    Worried About Running Out of Food in the Last Year: Never true    Ran Out of Food in the Last Year: Never true  Transportation Needs: No Transportation Needs (03/03/2021)   PRAPARE - Hydrologist (Medical): No    Lack of Transportation (Non-Medical): No  Physical Activity: Sufficiently Active (03/03/2021)   Exercise Vital Sign    Days of Exercise per Week: 7 days    Minutes of Exercise per Session: 30 min  Stress: No Stress Concern Present (03/03/2021)   [place]    Feeling of Stress : Not at all  Social Connections: Moderately Integrated (03/03/2021)   Social Connection and Isolation Panel [NHANES]    Frequency of Communication with Friends and Family: More than three times a week    Frequency of Social Gatherings with Friends and Family: Three times a week    Attends Religious Services: More than 4 times per year    Active Member of Clubs or Organizations: No    Attends Archivist Meetings: Never    Marital Status: Married  Human resources officer Violence: Not At Risk (03/03/2021)   Humiliation, Afraid, Rape, and Kick questionnaire    Fear of Current or Ex-Partner: No    Emotionally Abused: No    Physically Abused: No    Sexually Abused: No    Allergies: Allergies  Allergen Reactions   Tetanus Toxoids Shortness Of Breath   Sulfa Antibiotics     Severe vomiting    Current Medications: Current Outpatient Medications  Medication Sig Dispense Refill   acetaminophen (TYLENOL) 325 MG  tablet Take 650 mg by mouth every 6 (six) hours as needed.     Ascorbic Acid (VITAMIN C) 1000 MG tablet Take 1,000 mg by mouth daily.     calcium carbonate (OS-CAL - DOSED IN MG OF ELEMENTAL CALCIUM) 1250 (500 Ca) MG tablet Take 1 tablet by mouth daily with breakfast.     enoxaparin (LOVENOX) 40 MG/0.4ML injection  Inject into the skin.     escitalopram (LEXAPRO) 20 MG tablet Take 1 tablet (20 mg total) by mouth daily. 90 tablet 3   famotidine (PEPCID) 10 MG tablet Take 1 tablet (10 mg total) by mouth 2 (two) times daily as needed for heartburn or indigestion.     hydrOXYzine (ATARAX/VISTARIL) 25 MG tablet TAKE 1-2 TABLETS (25-50 MG TOTAL) BY MOUTH AT BEDTIME AS NEEDED FOR ANXIETY (INSOMNIA). 180 tablet 2   LORazepam (ATIVAN) 0.5 MG tablet Take 1 tablet (0.5 mg total) by mouth every 6 (six) hours as needed for anxiety. 3 tablet 0   lovastatin (MEVACOR) 20 MG tablet TAKE 1 TABLET BY MOUTH EVERYDAY AT BEDTIME 90 tablet 3   melatonin 3 MG TABS tablet Take 3 mg by mouth at bedtime.     metoprolol succinate (TOPROL-XL) 25 MG 24 hr tablet Take by mouth.     omeprazole (PRILOSEC OTC) 20 MG tablet Take by mouth.     meloxicam (MOBIC) 15 MG tablet Take 1 tablet by mouth daily.     meloxicam (MOBIC) 15 MG tablet Take 15 mg by mouth daily as needed for pain.     vitamin B-12 (CYANOCOBALAMIN) 1000 MCG tablet Take 1,000 mcg by mouth daily.     No current facility-administered medications for this visit.    Review of Systems General: no complaints  HEENT: no complaints  Lungs: no complaints  Cardiac: no complaints  GI: no complaints  GU: no complaints  Musculoskeletal: no complaints  Extremities: no complaints  Skin: no complaints  Neuro: no complaints  Endocrine: no complaints  Psych: no complaints       Objective:  Physical Examination:  BP 127/64   Pulse 68   Temp 98.7 F (37.1 C)   Resp 20   Wt 175 lb 6.4 oz (79.6 kg)   LMP  (LMP Unknown)   SpO2 97%   BMI 30.11 kg/m     ECOG  Performance Status: 1 - Symptomatic but completely ambulatory  GENERAL: Patient is a well appearing female in no acute distress. Accompanied by husband.  HEENT:  PERRL ABDOMEN:  nontender, soft, nondistended. No masses, ascites, or hernias.  EXTREMITIES:  No peripheral edema.   SKIN:  Wound intact. Steri strips removed. An area of redness at the inferior aspect of the wound was cleaned, antibiotic ointment was applied and the wound was dressed sterilely NEURO:  Nonfocal. Well oriented.  Appropriate affect.    Lab Review     Radiologic Imaging: CT C/A/P ordered    Assessment:  [name] is a 76 y.o. female stage Ic high grade serous cancer of the ovary.   Leukocytosis  Mild Anemia  Hypoalbuminemia  Medical co-morbidities complicating care: prior vaginal hysterectomy; asthma, and prior skin cancers Plan:   Problem List Items Addressed This Visit   None Visit Diagnoses     Primary adenocarcinoma of ovary, unspecified laterality ([place])    -  Primary       We reviewed pathology results and discussed options for management.  I recommended chemotherapy with Dr. Rogue Bussing and Tumor Board recommended paclitaxel and carboplatin.  Genetic testing discussed and recommended. Use of PARPi to be discussed but not recommended for stage I disease.  HRD testing can be performed now or if needed in the future.   CBC, and CMP today to follow-up on leukocytosis and anemia and hypoalbuminemia.  We reviewed wound care.  Recommend holding chemotherapy for 1 to 2 weeks to ensure wound completely healed  The patient's diagnosis, an outline of the further diagnostic and laboratory studies which will be required, the recommendation for surgery, and alternatives were discussed with her and her accompanying family members.  All questions were answered to their satisfaction.  Tauheedah Bok Gaetana Michaelis, MD      CC: Dr. Matilde Sprang

## 2021-09-29 ENCOUNTER — Telehealth: Payer: Self-pay | Admitting: Internal Medicine

## 2021-09-29 ENCOUNTER — Other Ambulatory Visit: Payer: Self-pay

## 2021-09-29 NOTE — Telephone Encounter (Signed)
Left voicemail message to call back  

## 2021-09-29 NOTE — Telephone Encounter (Signed)
Pt called stating she got a call about scheduling an echo however, she states she doesn't need it anymore and would like order to be cancelled.

## 2021-09-30 LAB — CA 125: Cancer Antigen (CA) 125: 39.7 U/mL — ABNORMAL HIGH (ref 0.0–38.1)

## 2021-10-03 ENCOUNTER — Other Ambulatory Visit: Payer: Self-pay | Admitting: Internal Medicine

## 2021-10-04 ENCOUNTER — Other Ambulatory Visit: Payer: Self-pay | Admitting: Internal Medicine

## 2021-10-04 ENCOUNTER — Inpatient Hospital Stay: Payer: PPO

## 2021-10-04 DIAGNOSIS — C562 Malignant neoplasm of left ovary: Secondary | ICD-10-CM

## 2021-10-05 ENCOUNTER — Ambulatory Visit: Payer: PPO | Admitting: Dermatology

## 2021-10-10 ENCOUNTER — Inpatient Hospital Stay: Payer: PPO | Admitting: Nurse Practitioner

## 2021-10-10 VITALS — BP 119/63 | HR 73 | Temp 99.6°F | Resp 16 | Wt 174.0 lb

## 2021-10-10 DIAGNOSIS — C562 Malignant neoplasm of left ovary: Secondary | ICD-10-CM

## 2021-10-10 DIAGNOSIS — Z5111 Encounter for antineoplastic chemotherapy: Secondary | ICD-10-CM | POA: Diagnosis not present

## 2021-10-10 DIAGNOSIS — T148XXA Other injury of unspecified body region, initial encounter: Secondary | ICD-10-CM | POA: Diagnosis not present

## 2021-10-10 NOTE — Progress Notes (Signed)
Pt here for wound check. Denies redness, drainage, or fever.

## 2021-10-10 NOTE — Progress Notes (Signed)
Gynecologic Oncology Interval Visit   Referring Provider: Dr. Matilde Sprang  Chief Complaint: Ovarian Mass  Subjective:  [name] is a 76 y.o. female s/p prior hysterectomy (ovaries in situ) who is seen in consultation from Dr. Matilde Sprang for ovarian mass. S/p ex-lap, BSO, biopsies, appendectomy, adhesiolysis. Plan for adjuvant chemotherapy with Dr. Rogue Bussing. Presents for wound check today prior to starting chemotherapy.   Feels well.    Gynecologic Oncologic History [name] is a pleasant female s/p prior hysterectomy (ovaries in situ) who is seen in consultation from Dr. Matilde Sprang for ovarian mass.   Patient presented to Urology for incontinence. She had elevated residual urine and ultrasound was performed which showed 12.2 x 10.1 x 2.9 cm mixed cystic and solid mass suspicious for ovarian mass.   MRI 08/04/21- Reproductive: Large cystic and solid pelvic mass centered in the pelvis. Ovarian tissue or ovary is not demonstrated on the current study. Mass in total measuring 15 x 11 x 12 cm. Soft tissue component showing restricted diffusion and enhancement centered in the lower portion of the mass shows heterogeneous T2 signal and measures 10.8 x 6.8 x 8.5 cm amidst cystic areas. There is a small amount of associated ascites. The mass abuts the vaginal apex following hysterectomy, potentially involving the LEFT vaginal apex. The "soft tissue component displays very heterogeneous enhancement and there are numerous enhancing septations throughout. Collateral pathways from LEFT ovarian vein are noted about the LEFT lateral aspect of the mass. Given that the gonadal vein can be traced to the mass suspicion is for mass of LEFT ovarian origin though this may of parasitized flow from ovarian. Vessels on the LEFT   Other: Trace ascites. No discrete peritoneal nodularity about the pelvis.   Musculoskeletal: No suspicious bone lesion   IMPRESSION: 1. Large cystic and solid pelvic mass highly  suspicious for neoplasm. Tumor may arise from the LEFT ovary based on proximity to LEFT ovarian vein though no defined normal ovarian tissue can be seen on either the RIGHT or the LEFT on today's exam. Correlate with surgical history. Sarcoma is also considered based on the marked heterogeneity seen within the lesion on the current study. There is strong restricted diffusion within and avid enhancement of the large central soft tissue component. Gyn consultation gyn consultation is suggested. 2. Trace ascites. 3. Tumor in close proximity to sigmoid colon without definitive signs of involvement also in close proximity to the urinary bladder with defined plane between urinary bladder and mass. Mass may involve the LEFT vaginal fornix. 4. Small amount of associated ascites.  She has history of hysterectomy at age 20 for endometriosis.   We recommended surgical management.  09/08/2021  Diagnostic laparoscopy, exploratory laparotomy, bilateral salpingo-oophorectomy, infracolic omentectomy, peritoneal biopsies, appendectomy with partial cecectomy, adhesiolysis (including enterolysis and ovariolysis), and posterior rectus sheath blocks    She has done well since surgery and has no significant complaints.  She continues on Lovenox.  A. 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 Negative for surface involvement. Bilateral fallopian tubes: Negative for carcinoma.   See synoptic report.   Comment: The tumor has an unusual microcystic morphology, but immunohistochemical studies are most consistent with a high-grade serous carcinoma.   B.  Right ovary, oophorectomy: Right ovary with no pathologic diagnosis.  Negative for carcinoma.   C. Omentum, omentectomy: Benign fibroadipose tissue. Negative for carcinoma.   D. Left paracolic gutter, biopsy: Benign fibroadipose  tissue and  skeletal muscle. Negative for carcinoma.   E. Left pelvic, biopsy: Benign fibroadipose tissue and smooth muscle. Negative for carcinoma.   F. Anterior cul de sac, biopsy: Benign fibroadipose tissue and smooth muscle. Negative for carcinoma.   G. Right pelvic, biopsy: Benign fibrous tissue and smooth muscle. Negative for carcinoma.   H. Posterior cul de sac, biopsy: Benign fibrous tissue. Negative for carcinoma.   I. Appendix and portion of cecum, appendectomy and partial cecectomy: Appendix and portion of cecum with no pathologic diagnosis. Negative for carcinoma.  Pelvic washings - negative for malignancy   Genetic Risk Assessment: Genetic testing needed. Molecular Tumor testing: Testing to be determined.   Problem List: Patient Active Problem List   Diagnosis Date Noted   Ovarian cancer on left ([place]) 09/28/2021   Ovarian mass 08/10/2021   PSVT (paroxysmal supraventricular tachycardia) ([place]) 08/14/2019   Vitamin D deficiency 06/10/2019   Current mild episode of major depressive disorder without prior episode ([place]) 10/15/2018   GAD (generalized anxiety disorder) 10/15/2018   Personal history of colonic polyps    Obesity (BMI 30.0-34.9) 05/29/2018   GERD (gastroesophageal reflux disease) 11/27/2017   Osteopenia 11/27/2017   Mixed hyperlipidemia 12/09/2014   Insomnia due to stress 12/09/2014    Past Medical History: Past Medical History:  Diagnosis Date   Allergy    Arthritis    Asthma    Atypical chest pain 03/14/2019   Atypical mole 06/12/2018   right prox lat thigh/mod   Atypical mole 05/19/2014   left sup med buttock/excision   Atypical mole 03/26/2014   right sup lat buttock   Atypical mole 03/26/2013   left lat mid back   Atypical mole 11/08/2006   right shoulder superior/excision   Basal cell carcinoma 06/12/2016   right medial sup canthus below eyebrow   Basal cell carcinoma 09/23/2014   left nasal bridge   Basal cell carcinoma 03/26/2013    left nasal sidewall   Basal cell carcinoma 08/27/2012   left nasal dorsum   Cancer (HCC)    shoulder, nose skin CA- basal cell CA   GERD (gastroesophageal reflux disease)    occasional   Hyperlipidemia    Osteopenia    Polyp of transverse colon    Right arm weakness 03/14/2019   Stress due to illness of family member 05/29/2018    Past Surgical History: Past Surgical History:  Procedure Laterality Date   BREAST BIOPSY Right    core bx- neg   COLONOSCOPY     COLONOSCOPY WITH PROPOFOL N/A 10/08/2018   Procedure: COLONOSCOPY WITH PROPOFOL;  Surgeon: Lucilla Lame, MD;  Location: [facility] ENDOSCOPY;  Service: Endoscopy;  Laterality: N/A;   MOHS SURGERY     TONSILLECTOMY     VAGINAL HYSTERECTOMY      Past Gynecologic History:  Menarche: age 10 Hysterectomy at age 39.    OB History:  OB History  No obstetric history on file.    Family History: Family History  Problem Relation Age of Onset   Lung cancer Mother    Diabetes Father    Parkinson's disease Father    Alzheimer's disease Father    Heart attack Father 57   Alcohol abuse Sister    Heart disease Sister    Lung cancer Sister    Down syndrome Sister    Colon cancer Paternal Uncle    Rectal cancer Paternal Uncle    Stroke Paternal Grandmother    Cancer Paternal Grandfather  lung   Cancer Son        livcolon, liver stage 4   Liver cancer Son    Esophageal cancer Neg Hx    Stomach cancer Neg Hx    Breast cancer Neg Hx     Social History: Social History   Socioeconomic History   Marital status: Married    Spouse name: [name]   Number of children: 2   Years of education: Not on file   Highest education level: Bachelor's degree (e.g., BA, AB, BS)  Occupational History   Occupation: Retired  Tobacco Use   Smoking status: Former    Packs/day: 1.50    Years: 20.00    Total pack years: 30.00    Types: Cigarettes    Quit date: 1993    Years since quitting: 30.6    Passive exposure: Past   Smokeless  tobacco: Never  Vaping Use   Vaping Use: Never used  Substance and Sexual Activity   Alcohol use: Yes    Alcohol/week: 1.0 standard drink of alcohol    Types: 1 Glasses of wine per week    Comment: one glass of wine once a month   Drug use: Never   Sexual activity: Not Currently  Other Topics Concern   Not on file  Social History Narrative   Lives with husband in [place]; quit smoking > 30 years; once every 1-2 months; retd. Teacher- elementary school;    Social Determinants of Health   Financial Resource Strain: Low Risk  (03/03/2021)   Overall Financial Resource Strain (CARDIA)    Difficulty of Paying Living Expenses: Not hard at all  Food Insecurity: No Food Insecurity (03/03/2021)   Hunger Vital Sign    Worried About Running Out of Food in the Last Year: Never true    Ran Out of Food in the Last Year: Never true  Transportation Needs: No Transportation Needs (03/03/2021)   PRAPARE - Hydrologist (Medical): No    Lack of Transportation (Non-Medical): No  Physical Activity: Sufficiently Active (03/03/2021)   Exercise Vital Sign    Days of Exercise per Week: 7 days    Minutes of Exercise per Session: 30 min  Stress: No Stress Concern Present (03/03/2021)   [place]    Feeling of Stress : Not at all  Social Connections: Moderately Integrated (03/03/2021)   Social Connection and Isolation Panel [NHANES]    Frequency of Communication with Friends and Family: More than three times a week    Frequency of Social Gatherings with Friends and Family: Three times a week    Attends Religious Services: More than 4 times per year    Active Member of Clubs or Organizations: No    Attends Archivist Meetings: Never    Marital Status: Married  Human resources officer Violence: Not At Risk (03/03/2021)   Humiliation, Afraid, Rape, and Kick questionnaire    Fear of Current or Ex-Partner: No     Emotionally Abused: No    Physically Abused: No    Sexually Abused: No    Allergies: Allergies  Allergen Reactions   Tetanus Toxoids Shortness Of Breath   Sulfa Antibiotics     Severe vomiting    Current Medications: Current Outpatient Medications  Medication Sig Dispense Refill   acetaminophen (TYLENOL) 325 MG tablet Take 650 mg by mouth every 6 (six) hours as needed.     Ascorbic Acid (VITAMIN C) 1000 MG tablet  Take 1,000 mg by mouth daily.     calcium carbonate (OS-CAL - DOSED IN MG OF ELEMENTAL CALCIUM) 1250 (500 Ca) MG tablet Take 1 tablet by mouth daily with breakfast.     escitalopram (LEXAPRO) 20 MG tablet Take 1 tablet (20 mg total) by mouth daily. 90 tablet 3   famotidine (PEPCID) 10 MG tablet Take 1 tablet (10 mg total) by mouth 2 (two) times daily as needed for heartburn or indigestion.     hydrOXYzine (ATARAX/VISTARIL) 25 MG tablet TAKE 1-2 TABLETS (25-50 MG TOTAL) BY MOUTH AT BEDTIME AS NEEDED FOR ANXIETY (INSOMNIA). 180 tablet 2   LORazepam (ATIVAN) 0.5 MG tablet Take 1 tablet (0.5 mg total) by mouth every 6 (six) hours as needed for anxiety. 3 tablet 0   lovastatin (MEVACOR) 20 MG tablet TAKE 1 TABLET BY MOUTH EVERYDAY AT BEDTIME 90 tablet 3   melatonin 3 MG TABS tablet Take 3 mg by mouth at bedtime.     meloxicam (MOBIC) 15 MG tablet Take 1 tablet by mouth daily.     meloxicam (MOBIC) 15 MG tablet Take 15 mg by mouth daily as needed for pain.     metoprolol succinate (TOPROL-XL) 25 MG 24 hr tablet Take by mouth.     omeprazole (PRILOSEC OTC) 20 MG tablet Take by mouth.     vitamin B-12 (CYANOCOBALAMIN) 1000 MCG tablet Take 1,000 mcg by mouth daily.     No current facility-administered medications for this visit.    Review of Systems General: no complaints  HEENT: no complaints  Lungs: no complaints  Cardiac: no complaints  GI: no complaints  GU: no complaints  Musculoskeletal: no complaints  Extremities: no complaints  Skin: no complaints  Neuro: no  complaints  Endocrine: no complaints  Psych: no complaints       Objective:  Physical Examination:  BP 119/63   Pulse 73   Temp 99.6 F (37.6 C) (Tympanic)   Resp 16   Wt 174 lb (78.9 kg) Comment: 6  LMP  (LMP Unknown)   SpO2 97%   BMI 29.87 kg/m     ECOG Performance Status: 1 - Symptomatic but completely ambulatory  GENERAL: Patient is a well appearing female in no acute distress.  ABDOMEN:  nontender, soft, nondistended.  SKIN:  Wound intact. Steri strips removed. An area of redness at the inferior aspect of the wound has now mostly closed. 1 mm defect. Silver nitrate applied. No evidence of infection.  NEURO:  Nonfocal. Well oriented.  Appropriate affect.  Lab Review:    Radiologic Imaging:     Assessment:  [name] is a 76 y.o. female stage Ic high grade serous cancer of the ovary s/p diagnostic laparoscopy, ex lap, BSO, infracolic omentectomy, peritoneal biopsies, appendectomy with partial cecectomy, adhesiolysis, and posterior rectus sheath blocks. She had prior hysterectomy. Pathology consistent with high grade serous. Plan for chemotherapy with Dr. Rogue Bussing. Presents for wound check.   Leukocytosis  Mild Anemia  Hypoalbuminemia  Medical co-morbidities complicating care: prior vaginal hysterectomy; asthma, and prior skin cancers Plan:   Problem List Items Addressed This Visit   None  Surgical wound has now healed. I provided chemical cautery with silver nitrate. Ok to proceed with chemotherapy as planned.   The patient's diagnosis, an outline of the further diagnostic and laboratory studies which will be required, the recommendation for surgery, and alternatives were discussed with her and her accompanying family members.  All questions were answered to their satisfaction.  Beckey Rutter,  DNP, AGNP-C [facility] [place] [facility] (clinic)  Dr. Rogue Bussing

## 2021-10-12 ENCOUNTER — Other Ambulatory Visit: Payer: PPO

## 2021-10-12 ENCOUNTER — Ambulatory Visit: Payer: PPO | Admitting: Internal Medicine

## 2021-10-13 ENCOUNTER — Encounter: Payer: Self-pay | Admitting: Licensed Clinical Social Worker

## 2021-10-13 ENCOUNTER — Inpatient Hospital Stay (HOSPITAL_BASED_OUTPATIENT_CLINIC_OR_DEPARTMENT_OTHER): Payer: PPO | Admitting: Internal Medicine

## 2021-10-13 ENCOUNTER — Inpatient Hospital Stay (HOSPITAL_BASED_OUTPATIENT_CLINIC_OR_DEPARTMENT_OTHER): Payer: PPO | Admitting: Licensed Clinical Social Worker

## 2021-10-13 ENCOUNTER — Inpatient Hospital Stay: Payer: PPO

## 2021-10-13 ENCOUNTER — Telehealth: Payer: Self-pay

## 2021-10-13 ENCOUNTER — Encounter: Payer: Self-pay | Admitting: Internal Medicine

## 2021-10-13 VITALS — BP 125/86 | HR 59 | Temp 97.1°F | Resp 16

## 2021-10-13 VITALS — BP 142/67 | HR 75 | Temp 97.7°F | Ht 64.0 in | Wt 177.8 lb

## 2021-10-13 DIAGNOSIS — Z800 Family history of malignant neoplasm of digestive organs: Secondary | ICD-10-CM | POA: Diagnosis not present

## 2021-10-13 DIAGNOSIS — C562 Malignant neoplasm of left ovary: Secondary | ICD-10-CM

## 2021-10-13 DIAGNOSIS — Z808 Family history of malignant neoplasm of other organs or systems: Secondary | ICD-10-CM

## 2021-10-13 DIAGNOSIS — Z801 Family history of malignant neoplasm of trachea, bronchus and lung: Secondary | ICD-10-CM

## 2021-10-13 DIAGNOSIS — Z5111 Encounter for antineoplastic chemotherapy: Secondary | ICD-10-CM | POA: Diagnosis not present

## 2021-10-13 LAB — COMPREHENSIVE METABOLIC PANEL
ALT: 17 U/L (ref 0–44)
AST: 22 U/L (ref 15–41)
Albumin: 3.6 g/dL (ref 3.5–5.0)
Alkaline Phosphatase: 57 U/L (ref 38–126)
Anion gap: 7 (ref 5–15)
BUN: 12 mg/dL (ref 8–23)
CO2: 25 mmol/L (ref 22–32)
Calcium: 8.5 mg/dL — ABNORMAL LOW (ref 8.9–10.3)
Chloride: 106 mmol/L (ref 98–111)
Creatinine, Ser: 0.89 mg/dL (ref 0.44–1.00)
GFR, Estimated: >60 mL/min (ref >60)
Glucose, Bld: 172 mg/dL — ABNORMAL HIGH (ref 70–99)
Potassium: 3.8 mmol/L (ref 3.5–5.1)
Sodium: 138 mmol/L (ref 135–145)
Total Bilirubin: 0.2 mg/dL — ABNORMAL LOW (ref 0.3–1.2)
Total Protein: 6.3 g/dL — ABNORMAL LOW (ref 6.5–8.1)

## 2021-10-13 LAB — CBC WITH DIFFERENTIAL/PLATELET
Abs Immature Granulocytes: 0.05 10*3/uL (ref 0.00–0.07)
Basophils Absolute: 0.1 10*3/uL (ref 0.0–0.1)
Basophils Relative: 1 %
Eosinophils Absolute: 0.4 10*3/uL (ref 0.0–0.5)
Eosinophils Relative: 4 %
HCT: 40.1 % (ref 36.0–46.0)
Hemoglobin: 13.3 g/dL (ref 12.0–15.0)
Immature Granulocytes: 1 %
Lymphocytes Relative: 51 %
Lymphs Abs: 5.1 10*3/uL — ABNORMAL HIGH (ref 0.7–4.0)
MCH: 30.9 pg (ref 26.0–34.0)
MCHC: 33.2 g/dL (ref 30.0–36.0)
MCV: 93 fL (ref 80.0–100.0)
Monocytes Absolute: 0.5 10*3/uL (ref 0.1–1.0)
Monocytes Relative: 5 %
Neutro Abs: 3.7 10*3/uL (ref 1.7–7.7)
Neutrophils Relative %: 38 %
Platelets: 205 10*3/uL (ref 150–400)
RBC: 4.31 MIL/uL (ref 3.87–5.11)
RDW: 13.2 % (ref 11.5–15.5)
WBC: 9.8 10*3/uL (ref 4.0–10.5)
nRBC: 0 % (ref 0.0–0.2)

## 2021-10-13 MED ORDER — SODIUM CHLORIDE 0.9 % IV SOLN
175.0000 mg/m2 | Freq: Once | INTRAVENOUS | Status: AC
  Administered 2021-10-13: 330 mg via INTRAVENOUS
  Filled 2021-10-13: qty 55

## 2021-10-13 MED ORDER — SODIUM CHLORIDE 0.9 % IV SOLN
150.0000 mg | Freq: Once | INTRAVENOUS | Status: AC
  Administered 2021-10-13: 150 mg via INTRAVENOUS
  Filled 2021-10-13: qty 150

## 2021-10-13 MED ORDER — SODIUM CHLORIDE 0.9 % IV SOLN
Freq: Once | INTRAVENOUS | Status: AC
  Filled 2021-10-13: qty 250

## 2021-10-13 MED ORDER — SODIUM CHLORIDE 0.9 % IV SOLN
425.5000 mg | Freq: Once | INTRAVENOUS | Status: AC
  Administered 2021-10-13: 430 mg via INTRAVENOUS
  Filled 2021-10-13: qty 43

## 2021-10-13 MED ORDER — SODIUM CHLORIDE 0.9 % IV SOLN
10.0000 mg | Freq: Once | INTRAVENOUS | Status: AC
  Administered 2021-10-13: 10 mg via INTRAVENOUS
  Filled 2021-10-13: qty 10

## 2021-10-13 MED ORDER — DIPHENHYDRAMINE HCL 50 MG/ML IJ SOLN
50.0000 mg | Freq: Once | INTRAMUSCULAR | Status: AC
  Administered 2021-10-13: 50 mg via INTRAVENOUS
  Filled 2021-10-13: qty 1

## 2021-10-13 MED ORDER — FAMOTIDINE IN NACL 20-0.9 MG/50ML-% IV SOLN
20.0000 mg | Freq: Once | INTRAVENOUS | Status: AC
  Administered 2021-10-13: 20 mg via INTRAVENOUS
  Filled 2021-10-13: qty 50

## 2021-10-13 MED ORDER — ONDANSETRON HCL 8 MG PO TABS: ORAL_TABLET | ORAL | 1 refills | Status: DC

## 2021-10-13 MED ORDER — PROCHLORPERAZINE MALEATE 10 MG PO TABS: 10.0000 mg | ORAL_TABLET | Freq: Four times a day (QID) | ORAL | 1 refills | Status: DC | PRN

## 2021-10-13 MED ORDER — PALONOSETRON HCL INJECTION 0.25 MG/5ML
0.2500 mg | Freq: Once | INTRAVENOUS | Status: AC
  Administered 2021-10-13: 0.25 mg via INTRAVENOUS
  Filled 2021-10-13: qty 5

## 2021-10-13 NOTE — Progress Notes (Signed)
[place] NOTE  Patient Care Team: [name], PA-C as PCP - General (Family Medicine) End, Harrell Gave, MD as PCP - Cardiology (Cardiology) Ralene Bathe, MD (Dermatology) Arelia Sneddon, [place] (Optometry) Vladimir Crofts, MD as Consulting Physician (Neurology) Beverly Gust, MD (Otolaryngology) Clent Jacks, RN as Oncology Nurse Navigator  CHIEF COMPLAINTS/PURPOSE OF CONSULTATION: Ovarian cancer.   Oncology History Overview Note  s/p prior hysterectomy (ovaries in situ); Dr. Matilde Sprang for ovarian mass.   incontinence.MRI 08/04/21- Reproductive: Large cystic and solid pelvic mass centered in the pelvis. Ovarian tissue or ovary is not demonstrated on the current study. Mass in total measuring 15 x 11 x 12 cm. Soft tissue component showing restricted diffusion and enhancement centered in the lower portion of the mass shows heterogeneous T2 signal and measures 10.8 x 6.8 x 8.5 cm amidst cystic areas. There is a small amount of associated ascites. The mass abuts the vaginal apex following hysterectomy, potentially involving the LEFT vaginal apex. The "soft tissue component displays very heterogeneous enhancement and there are numerous enhancing septations throughout. Collateral pathways from LEFT ovarian vein are noted about the LEFT lateral aspect of the mass. Given that the gonadal vein can be traced to the mass suspicion is for mass of LEFT ovarian origin though this may of parasitized flow from ovarian. 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 Theora Gianotti; pelvic washings negative]   # AUG 30th, 2023- Carbo-taxol; with GCSF   # PSVT [Dr.Gollan] on metoprolol   Ovarian cancer on left ([place])  09/28/2021 Initial Diagnosis   Ovarian cancer on left ([place])   09/28/2021 Cancer Staging   Staging  form: Ovary, Fallopian Tube, and Primary Peritoneal Carcinoma, AJCC 8th Edition - Clinical: Stage IC (cT1c1, cN0, cM0) - Signed by Cammie Sickle, MD on 09/28/2021   10/13/2021 -  Chemotherapy   Patient is on Treatment Plan : OVARIAN Carboplatin (AUC 6) + Paclitaxel (175) q21d X 6 Cycles        HISTORY OF PRESENTING ILLNESS: Ambulating independently.  Alone.   [name] 76 y.o.  female with high-grade serous ovarian cancer stage Ic is here to proceed with adjuvant chemotherapy.  Patient has had no significant complaints since last visit.  She is not evaluation with gynecology.  Sutures healing well.  Review of Systems  Constitutional:  Negative for chills, diaphoresis, fever, malaise/fatigue and weight loss.  HENT:  Negative for nosebleeds and sore throat.   Eyes:  Negative for double vision.  Respiratory:  Negative for cough, hemoptysis, sputum production, shortness of breath and wheezing.   Cardiovascular:  Negative for chest pain, palpitations, orthopnea and leg swelling.  Gastrointestinal:  Negative for abdominal pain, blood in stool, constipation, diarrhea, heartburn, melena, nausea and vomiting.  Genitourinary:  Negative for dysuria, frequency and urgency.  Musculoskeletal:  Negative for back pain and joint pain.  Skin: Negative.  Negative for itching and rash.  Neurological:  Negative for dizziness, tingling, focal weakness, weakness and headaches.  Endo/Heme/Allergies:  Does not bruise/bleed easily.  Psychiatric/Behavioral:  Negative for depression. The patient is not nervous/anxious and does not have insomnia.     MEDICAL HISTORY:  Past Medical History:  Diagnosis Date   Allergy    Arthritis    Asthma    Atypical chest pain 03/14/2019   Atypical mole 06/12/2018  right prox lat thigh/mod   Atypical mole 05/19/2014   left sup med buttock/excision   Atypical mole 03/26/2014   right sup lat buttock   Atypical mole 03/26/2013   left lat mid back   Atypical  mole 11/08/2006   right shoulder superior/excision   Basal cell carcinoma 06/12/2016   right medial sup canthus below eyebrow   Basal cell carcinoma 09/23/2014   left nasal bridge   Basal cell carcinoma 03/26/2013   left nasal sidewall   Basal cell carcinoma 08/27/2012   left nasal dorsum   Cancer (HCC)    shoulder, nose skin CA- basal cell CA   GERD (gastroesophageal reflux disease)    occasional   Hyperlipidemia    Osteopenia    Polyp of transverse colon    Right arm weakness 03/14/2019   Stress due to illness of family member 05/29/2018    SURGICAL HISTORY: Past Surgical History:  Procedure Laterality Date   BREAST BIOPSY Right    core bx- neg   COLONOSCOPY     COLONOSCOPY WITH PROPOFOL N/A 10/08/2018   Procedure: COLONOSCOPY WITH PROPOFOL;  Surgeon: Lucilla Lame, MD;  Location: [facility] ENDOSCOPY;  Service: Endoscopy;  Laterality: N/A;   MOHS SURGERY     TONSILLECTOMY     VAGINAL HYSTERECTOMY      SOCIAL HISTORY: Social History   Socioeconomic History   Marital status: Married    Spouse name: [name]   Number of children: 2   Years of education: Not on file   Highest education level: Bachelor's degree (e.g., BA, AB, BS)  Occupational History   Occupation: Retired  Tobacco Use   Smoking status: Former    Packs/day: 1.50    Years: 20.00    Total pack years: 30.00    Types: Cigarettes    Quit date: 1993    Years since quitting: 30.6    Passive exposure: Past   Smokeless tobacco: Never  Vaping Use   Vaping Use: Never used  Substance and Sexual Activity   Alcohol use: Yes    Alcohol/week: 1.0 standard drink of alcohol    Types: 1 Glasses of wine per week    Comment: one glass of wine once a month   Drug use: Never   Sexual activity: Not Currently  Other Topics Concern   Not on file  Social History Narrative   [place]; quit smoking > 30 years; once every 1-2 months; retd. Teacher- elementary school;    Social Determinants of Health    Financial Resource Strain: Low Risk  (03/03/2021)   Overall Financial Resource Strain (CARDIA)    Difficulty of Paying Living Expenses: Not hard at all  Food Insecurity: No Food Insecurity (03/03/2021)   Hunger Vital Sign    Worried About Running Out of Food in the Last Year: Never true    Ran Out of Food in the Last Year: Never true  Transportation Needs: No Transportation Needs (03/03/2021)   PRAPARE - Hydrologist (Medical): No    Lack of Transportation (Non-Medical): No  Physical Activity: Sufficiently Active (03/03/2021)   Exercise Vital Sign    Days of Exercise per Week: 7 days    Minutes of Exercise per Session: 30 min  Stress: No Stress Concern Present (03/03/2021)   [place]    Feeling of Stress : Not at all  Social Connections: Moderately Integrated (03/03/2021)   Social Connection and Isolation Panel [  NHANES]    Frequency of Communication with Friends and Family: More than three times a week    Frequency of Social Gatherings with Friends and Family: Three times a week    Attends Religious Services: More than 4 times per year    Active Member of Clubs or Organizations: No    Attends Archivist Meetings: Never    Marital Status: Married  Human resources officer Violence: Not At Risk (03/03/2021)   Humiliation, Afraid, Rape, and Kick questionnaire    Fear of Current or Ex-Partner: No    Emotionally Abused: No    Physically Abused: No    Sexually Abused: No    FAMILY HISTORY: Family History  Problem Relation Age of Onset   Lung cancer Mother    Diabetes Father    Parkinson's disease Father    Alzheimer's disease Father    Heart attack Father 63   Alcohol abuse Sister    Heart disease Sister    Lung cancer Sister 57   Down syndrome Sister    Colon cancer Paternal Uncle        d. 46s   Melanoma Maternal Grandmother    Stroke Paternal Grandmother    Lung cancer  Paternal Grandmother        d. 66s   Stomach cancer Paternal Grandfather        d. 14s   Liver cancer Son 35   Esophageal cancer Neg Hx    Breast cancer Neg Hx     ALLERGIES:  is allergic to tetanus toxoids and sulfa antibiotics.  MEDICATIONS:  Current Outpatient Medications  Medication Sig Dispense Refill   acetaminophen (TYLENOL) 325 MG tablet Take 650 mg by mouth every 6 (six) hours as needed.     Ascorbic Acid (VITAMIN C) 1000 MG tablet Take 1,000 mg by mouth daily.     calcium carbonate (OS-CAL - DOSED IN MG OF ELEMENTAL CALCIUM) 1250 (500 Ca) MG tablet Take 1 tablet by mouth daily with breakfast.     escitalopram (LEXAPRO) 20 MG tablet Take 1 tablet (20 mg total) by mouth daily. 90 tablet 3   famotidine (PEPCID) 10 MG tablet Take 1 tablet (10 mg total) by mouth 2 (two) times daily as needed for heartburn or indigestion.     hydrOXYzine (ATARAX/VISTARIL) 25 MG tablet TAKE 1-2 TABLETS (25-50 MG TOTAL) BY MOUTH AT BEDTIME AS NEEDED FOR ANXIETY (INSOMNIA). 180 tablet 2   LORazepam (ATIVAN) 0.5 MG tablet Take 1 tablet (0.5 mg total) by mouth every 6 (six) hours as needed for anxiety. 3 tablet 0   lovastatin (MEVACOR) 20 MG tablet TAKE 1 TABLET BY MOUTH EVERYDAY AT BEDTIME 90 tablet 3   melatonin 3 MG TABS tablet Take 3 mg by mouth at bedtime.     meloxicam (MOBIC) 15 MG tablet Take 1 tablet by mouth daily.     meloxicam (MOBIC) 15 MG tablet Take 15 mg by mouth daily as needed for pain.     metoprolol succinate (TOPROL-XL) 25 MG 24 hr tablet Take by mouth.     omeprazole (PRILOSEC OTC) 20 MG tablet Take by mouth.     ondansetron (ZOFRAN) 8 MG tablet One pill every 8 hours as needed for nausea/vomitting. 40 tablet 1   prochlorperazine (COMPAZINE) 10 MG tablet Take 1 tablet (10 mg total) by mouth every 6 (six) hours as needed for nausea or vomiting. 40 tablet 1   vitamin B-12 (CYANOCOBALAMIN) 1000 MCG tablet Take 1,000 mcg by mouth daily.  No current facility-administered medications  for this visit.   Facility-Administered Medications Ordered in Other Visits  Medication Dose Route Frequency Provider Last Rate Last Admin   CARBOplatin (PARAPLATIN) 430 mg in sodium chloride 0.9 % 250 mL chemo infusion  430 mg Intravenous Once Charlaine Dalton R, MD       PACLitaxel (TAXOL) 330 mg in sodium chloride 0.9 % 500 mL chemo infusion (> '80mg'$ /m2)  175 mg/m2 (Treatment Plan Recorded) Intravenous Once Cammie Sickle, MD 185 mL/hr at 10/13/21 1150 Rate Change at 10/13/21 1150    PHYSICAL EXAMINATION: ECOG PERFORMANCE STATUS: 1 - Symptomatic but completely ambulatory  Vitals:   10/13/21 0816  BP: (!) 142/67  Pulse: 75  Temp: 97.7 F (36.5 C)  SpO2: 96%   Filed Weights   10/13/21 0816  Weight: 177 lb 12.8 oz (80.6 kg)    Physical Exam Vitals and nursing note reviewed.  HENT:     Head: Normocephalic and atraumatic.     Mouth/Throat:     Pharynx: Oropharynx is clear.  Eyes:     Extraocular Movements: Extraocular movements intact.     Pupils: Pupils are equal, round, and reactive to light.  Cardiovascular:     Rate and Rhythm: Normal rate and regular rhythm.  Pulmonary:     Comments: Decreased breath sounds bilaterally.  Abdominal:     Palpations: Abdomen is soft.  Musculoskeletal:        General: Normal range of motion.     Cervical back: Normal range of motion.  Skin:    General: Skin is warm.  Neurological:     General: No focal deficit present.     Mental Status: She is alert and oriented to person, place, and time.  Psychiatric:        Behavior: Behavior normal.        Judgment: Judgment normal.     LABORATORY DATA:  I have reviewed the data as listed Lab Results  Component Value Date   WBC 9.8 10/13/2021   HGB 13.3 10/13/2021   HCT 40.1 10/13/2021   MCV 93.0 10/13/2021   PLT 205 10/13/2021   Recent Labs    08/10/21 1203 09/28/21 1428 10/13/21 0812  NA 139 136 138  K 4.3 3.9 3.8  CL 107 102 106  CO2 '28 26 25  '$ GLUCOSE 90 111*  172*  BUN '15 14 12  '$ CREATININE 1.00 0.96 0.89  CALCIUM 8.1* 8.6* 8.5*  GFRNONAA 58* >60 >60  PROT 6.4* 6.4* 6.3*  ALBUMIN 3.9 3.8 3.6  AST '18 21 22  '$ ALT '15 22 17  '$ ALKPHOS 54 57 57  BILITOT 0.5 0.4 0.2*    RADIOGRAPHIC STUDIES: I have personally reviewed the radiological images as listed and agreed with the findings in the report. No results found.   Ovarian cancer on left [facility]) #High-grade serous ovarian cancer-left side-pT1c [stage Ic-spillage during surgery].  adjuvant chemotherapy with CarboTaxol every 3 weeks x 6 cycles.  Discussed the goal of therapy is cure.   #Proceed with cycle #1 of chemotherapy-carbo Taxol every 3 weeks. Labs today reviewed;  acceptable for treatment today.   #I again reviewed at length the individual components with chemotherapy; and the schedule in detail.  I also discussed the potential side effects including but not limited to-increasing fatigue, nausea vomiting, diarrhea, hair loss, sores in the mouth, increase risk of infection and also neuropathy.  Also reviewed the multiple strategies to avoid/mitigate similar side effects including preemptive medications-for nausea vomiting. Growth factor- would be  given as prophylaxis for chemotherapy-induced neutropenia to prevent febrile neutropenias. Discussed re: clairtin/NSAIDs   #Mild hypercalcemia calcium 8.5.  Continue calcium 600; add 2000 units of vitamin D; check vitamin D levels.  # Post BF- 172; non-diabetic- monitor for now.  Check hemoglobin A1c next visit.   # IV access: PIV.   # Genetics: awaiting genetics evaluation today.   # Vaccination: Okay with flu shot.  # DISPOSITION: # chemo today; udenyca- tomorrow.    # follow up with NP- labs- cbc/cmp; Hb A1c; vit D-OH levels   # follow up in 3 weeks- MD; labs- cbc/cmp; - ca 125;- CARBO-TAXOL; D-2 Ellen Henri- Dr.B     # PAIN CONTROL  # GENETICS:  # CLINICAL TRIALS:  # DISPOSITION: # labs today- ordered # Follow up-   Thank you Dr.  for allowing me to participate in the care of your pleasant patient. Please do not hesitate to contact me with questions or concerns in the interim.  Above plan of care was discussed with patient/family in detail.  My contact information was given to the patient/family.     Cammie Sickle, MD 10/13/2021 12:38 PM

## 2021-10-13 NOTE — Patient Instructions (Signed)
[facility] [place]  Discharge Instructions: Thank you for choosing [facility] to provide your oncology and hematology care.  If you have a lab appointment with the Cancer Center, please go directly to the Cancer Center and check in at the registration area.  Wear comfortable clothing and clothing appropriate for easy access to any Portacath or PICC line.   We strive to give you quality time with your provider. You may need to reschedule your appointment if you arrive late (15 or more minutes).  Arriving late affects you and other patients whose appointments are after yours.  Also, if you miss three or more appointments without notifying the office, you may be dismissed from the clinic at the provider's discretion.      For prescription refill requests, have your pharmacy contact our office and allow 72 hours for refills to be completed.    Today you received the following chemotherapy and/or immunotherapy agents Carboplatin & Taxol      To help prevent nausea and vomiting after your treatment, we encourage you to take your nausea medication as directed.  BELOW ARE SYMPTOMS THAT SHOULD BE REPORTED IMMEDIATELY: *FEVER GREATER THAN 100.4 F (38 C) OR HIGHER *CHILLS OR SWEATING *NAUSEA AND VOMITING THAT IS NOT CONTROLLED WITH YOUR NAUSEA MEDICATION *UNUSUAL SHORTNESS OF BREATH *UNUSUAL BRUISING OR BLEEDING *URINARY PROBLEMS (pain or burning when urinating, or frequent urination) *BOWEL PROBLEMS (unusual diarrhea, constipation, pain near the anus) TENDERNESS IN MOUTH AND THROAT WITH OR WITHOUT PRESENCE OF ULCERS (sore throat, sores in mouth, or a toothache) UNUSUAL RASH, SWELLING OR PAIN  UNUSUAL VAGINAL DISCHARGE OR ITCHING   Items with * indicate a potential emergency and should be followed up as soon as possible or go to the Emergency Department if any problems should occur.  Please show the CHEMOTHERAPY ALERT CARD or IMMUNOTHERAPY ALERT CARD at  check-in to the Emergency Department and triage nurse.  Should you have questions after your visit or need to cancel or reschedule your appointment, please contact [facility] [place]  [phone] and follow the prompts.  Office hours are 8:00 a.m. to 4:30 p.m. Monday - Friday. Please note that voicemails left after 4:00 p.m. may not be returned until the following business day.  We are closed weekends and major holidays. You have access to a nurse at all times for urgent questions. Please call the main number to the clinic [phone] and follow the prompts.  For any non-urgent questions, you may also contact your provider using MyChart. We now offer e-Visits for anyone 18 and older to request care online for non-urgent symptoms. For details visit [email or web address].   Also download the MyChart app! Go to the app store, search "MyChart", open the app, select [facility], and log in with your MyChart username and password.  Masks are optional in the cancer centers. If you would like for your care team to wear a mask while they are taking care of you, please let them know. For doctor visits, patients may have with them one support person who is at least 76 years old. At this time, visitors are not allowed in the infusion area.   

## 2021-10-13 NOTE — Assessment & Plan Note (Addendum)
#  High-grade serous ovarian cancer-left side-pT1c [stage Ic-spillage during surgery].  adjuvant chemotherapy with CarboTaxol every 3 weeks x 6 cycles.  Discussed the goal of therapy is cure.   #Proceed with cycle #1 of chemotherapy-carbo Taxol every 3 weeks. Labs today reviewed;  acceptable for treatment today.   #I again reviewed at length the individual components with chemotherapy; and the schedule in detail.  I also discussed the potential side effects including but not limited to-increasing fatigue, nausea vomiting, diarrhea, hair loss, sores in the mouth, increase risk of infection and also neuropathy.  Also reviewed the multiple strategies to avoid/mitigate similar side effects including preemptive medications-for nausea vomiting. Growth factor- would be given as prophylaxis for chemotherapy-induced neutropenia to prevent febrile neutropenias. Discussed re: clairtin/NSAIDs   #Mild hypercalcemia calcium 8.5.  Continue calcium 600; add 2000 units of vitamin D; check vitamin D levels.  # Post BF- 172; non-diabetic- monitor for now.  Check hemoglobin A1c next visit.   # IV access: PIV.   # Genetics: awaiting genetics evaluation today.   # Vaccination: Okay with flu shot.  # DISPOSITION: # chemo today; udenyca- tomorrow.    # follow up with NP- labs- cbc/cmp; Hb A1c; vit D-OH levels   # follow up in 3 weeks- MD; labs- cbc/cmp; - ca 125;- CARBO-TAXOL; D-2 Ellen Henri- Dr.B

## 2021-10-13 NOTE — Telephone Encounter (Signed)
Prior auth submitted thru cover my meds.

## 2021-10-13 NOTE — Progress Notes (Signed)
Pt needs something called in for nausea.  Pt is having some anxiety with what to expect today with chemo and would like you to go over this with her.

## 2021-10-13 NOTE — Telephone Encounter (Signed)
-----   Message from Secundino Ginger sent at 10/13/2021  8:51 AM EDT ----- Regarding: PRIOR AUTHORIZATION - [facility] On[facility] sent to her chart.

## 2021-10-13 NOTE — Progress Notes (Signed)
REFERRING PROVIDER: Gillis Ends, MD [facility],  [place] [address]  PRIMARY PROVIDER:  [name], PA-C  PRIMARY REASON FOR VISIT:  1. Ovarian cancer on left ([place])   2. Family history of colon cancer   3. Family history of stomach cancer   4. Family history of lung cancer   5. Family history of melanoma      HISTORY OF PRESENT ILLNESS:   [name], a 76 y.o. female, was seen for a [facility] cancer genetics consultation at the request of Dr. Theora Gianotti due to a personal and family history of cancer.  [name] presents to clinic today to discuss the possibility of a hereditary predisposition to cancer, genetic testing, and to further clarify her future cancer risks, as well as potential cancer risks for family members.   CANCER HISTORY:  Oncology History Overview Note  s/p prior hysterectomy (ovaries in situ); Dr. Matilde Sprang for ovarian mass.   incontinence.MRI 08/04/21- Reproductive: Large cystic and solid pelvic mass centered in the pelvis. Ovarian tissue or ovary is not demonstrated on the current study. Mass in total measuring 15 x 11 x 12 cm. Soft tissue component showing restricted diffusion and enhancement centered in the lower portion of the mass shows heterogeneous T2 signal and measures 10.8 x 6.8 x 8.5 cm amidst cystic areas. There is a small amount of associated ascites. The mass abuts the vaginal apex following hysterectomy, potentially involving the LEFT vaginal apex. The "soft tissue component displays very heterogeneous enhancement and there are numerous enhancing septations throughout. Collateral pathways from LEFT ovarian vein are noted about the LEFT lateral aspect of the mass. Given that the gonadal vein can be traced to the mass suspicion is for mass of LEFT ovarian origin though this may of parasitized flow from ovarian. Vessels on the LEFT   # JULY 2023- LEFT OVARIAN CANCER [unusual -high grade serous based on IHC; frozen  section signet cell; ]- s/p TAH & BSO [[facility]]- pT1C [tumor spillage; controlled surgery-discussed with Dr. Theora Gianotti; pelvic washings negative]   # AUG 30th, 2023- Carbo-taxol; with GCSF   # PSVT [Dr.Gollan] on metoprolol   Ovarian cancer on left ([facility] [place])  09/28/2021 Initial Diagnosis   Ovarian cancer on left ([place])   09/28/2021 Cancer Staging   Staging form: Ovary, Fallopian Tube, and Primary Peritoneal Carcinoma, AJCC 8th Edition - Clinical: Stage IC (cT1c1, cN0, cM0) - Signed by Cammie Sickle, MD on 09/28/2021   10/13/2021 -  Chemotherapy   Patient is on Treatment Plan : OVARIAN Carboplatin (AUC 6) + Paclitaxel (175) q21d X 6 Cycles      In 2023, at the age of 35, [name] was diagnosed with ovarian cancer (left ovary). She had surgery in July and is being treated with chemotherapy.  RISK FACTORS:  Menarche was at age 41.  First live birth at age 61.  OCP use for approximately 5 years.  Ovaries intact: no.  Hysterectomy: yes.  Menopausal status: postmenopausal.  Colonoscopy: yes;  few polyps . Number of breast biopsies: 1.  Past Medical History:  Diagnosis Date   Allergy    Arthritis    Asthma    Atypical chest pain 03/14/2019   Atypical mole 06/12/2018   right prox lat thigh/mod   Atypical mole 05/19/2014   left sup med buttock/excision   Atypical mole 03/26/2014   right sup lat buttock   Atypical mole 03/26/2013   left lat mid back   Atypical mole 11/08/2006  right shoulder superior/excision   Basal cell carcinoma 06/12/2016   right medial sup canthus below eyebrow   Basal cell carcinoma 09/23/2014   left nasal bridge   Basal cell carcinoma 03/26/2013   left nasal sidewall   Basal cell carcinoma 08/27/2012   left nasal dorsum   Cancer (HCC)    shoulder, nose skin CA- basal cell CA   GERD (gastroesophageal reflux disease)    occasional   Hyperlipidemia    Osteopenia    Polyp of transverse colon    Right arm weakness 03/14/2019   Stress due to illness of  family member 05/29/2018    Past Surgical History:  Procedure Laterality Date   BREAST BIOPSY Right    core bx- neg   COLONOSCOPY     COLONOSCOPY WITH PROPOFOL N/A 10/08/2018   Procedure: COLONOSCOPY WITH PROPOFOL;  Surgeon: Lucilla Lame, MD;  Location: [facility] ENDOSCOPY;  Service: Endoscopy;  Laterality: N/A;   MOHS SURGERY     TONSILLECTOMY     VAGINAL HYSTERECTOMY      FAMILY HISTORY:  We obtained a detailed, 4-generation family history.  Significant diagnoses are listed below: Family History  Problem Relation Age of Onset   Lung cancer Mother    Diabetes Father    Parkinson's disease Father    Alzheimer's disease Father    Heart attack Father 25   Alcohol abuse Sister    Heart disease Sister    Lung cancer Sister 74   Down syndrome Sister    Colon cancer Paternal Uncle        d. 95s   Melanoma Maternal Grandmother    Stroke Paternal Grandmother    Lung cancer Paternal Grandmother        d. 81s   Stomach cancer Paternal Grandfather        d. 24s   Liver cancer Son 6   Esophageal cancer Neg Hx    Breast cancer Neg Hx    Ms. Henner has 2 sons, 77 and 58. Her older son has liver cancer that has metastasized to his lung. Ms. Slovacek had 2 sisters. One passed at 28 of lung cancer. Her other sister has Down syndrome.  Ms. Kruck mother passed at 7 and had lung cancer. Maternal grandmother had history of melanoma. No other known cancers on this side of the family.  Ms. Cohoon father had history of skin cancer, he passed at 34. A paternal uncle had colon cancer in his early 66s and passed in his 38s. Paternal grandmother had lung cancer and passed in her 78s, grandfather had stomach cancer and passed in his 22s.  Ms. Alber is unaware of previous family history of genetic testing for hereditary cancer risks. There is no reported Ashkenazi Jewish ancestry. There is no known consanguinity.    GENETIC COUNSELING ASSESSMENT: [name] is a 76 y.o. female with a personal  history of ovarian cancer which is somewhat suggestive of a hereditary cancer syndrome and predisposition to cancer. We, therefore, discussed and recommended the following at today's visit.   DISCUSSION: We discussed that approximately 15-20% of ovarian cancer is hereditary. Most cases of hereditary ovarian cancer are associated with BRCA1/BRCA2 genes, although there are other genes associated with hereditary ovarian cancer as well. Cancers and risks are gene specific. We discussed that testing is beneficial for several reasons including knowing about cancer risks, identifying potential screening and risk-reduction options that may be appropriate, and to understand if other family members could be at risk for cancer and  allow them to undergo genetic testing.   We reviewed the characteristics, features and inheritance patterns of hereditary cancer syndromes. We also discussed genetic testing, including the appropriate family members to test, the process of testing, insurance coverage and turn-around-time for results. We discussed the implications of a negative, positive and/or variant of uncertain significant result. We recommended Ms. Mehlhoff pursue genetic testing for the [facility] Multi-Cancer+RNA gene panel.   The Multi-Cancer Panel + RNA offered by [facility] includes sequencing and/or deletion duplication testing of the following 84 genes: AIP, ALK, APC, ATM, AXIN2,BAP1,  BARD1, BLM, BMPR1A, BRCA1, BRCA2, BRIP1, CASR, CDC73, CDH1, CDK4, CDKN1B, CDKN1C, CDKN2A (p14ARF), CDKN2A (p16INK4a), CEBPA, CHEK2, CTNNA1, DICER1, DIS3L2, EGFR (c.2369C>T, p.Thr790Met variant only), EPCAM (Deletion/duplication testing only), FH, FLCN, GATA2, GPC3, GREM1 (Promoter region deletion/duplication testing only), HOXB13 (c.251G>A, p.Gly84Glu), HRAS, KIT, MAX, MEN1, MET, MITF (c.952G>A, p.Glu318Lys variant only), MLH1, MSH2, MSH3, MSH6, MUTYH, NBN, NF1, NF2, NTHL1, PALB2, PDGFRA, PHOX2B, PMS2, POLD1, POLE, POT1, PRKAR1A, PTCH1, PTEN,  RAD50, RAD51C, RAD51D, RB1, RECQL4, RET, RUNX1, SDHAF2, SDHA (sequence changes only), SDHB, SDHC, SDHD, SMAD4, SMARCA4, SMARCB1, SMARCE1, STK11, SUFU, TERC, TERT, TMEM127, TP53, TSC1, TSC2, VHL, WRN and WT1.  We discussed that genetic testing through [facility] will test for hereditary mutations that could explain her diagnosis of cancer. However, homologous recombination testing (HRD) is genetic testing performed on the tumor that can determine genetic changes that could influence her management such as eligibility for targeted therapies.  This testing will be ordered through [facility].  Based on Ms. Dettmer's personal and family history of cancer, she meets medical criteria for genetic testing. Despite that she meets criteria, she may still have an out of pocket cost. We discussed that if her out of pocket cost for testing is over $100, the laboratory will call and confirm whether she wants to proceed with testing.  If the out of pocket cost of testing is less than $100 she will be billed by the genetic testing laboratory.   PLAN: After considering the risks, benefits, and limitations, Ms. Krus provided informed consent to pursue genetic testing and the blood sample was sent to [facility] for analysis of the Multi-Cancer+RNA panel, and [facility] HRD testing will be ordered today as well. Results should be available within approximately 2-3 weeks' time, at which point they will be disclosed by telephone to Ms. Marceaux, as will any additional recommendations warranted by these results. Ms. Jaye will receive a summary of her genetic counseling visit and a copy of her results once available. This information will also be available in Epic.   Ms. Awtrey questions were answered to her satisfaction today. Our contact information was provided should additional questions or concerns arise. Thank you for the referral and allowing [place] to share in the care of your patient.   Faith Rogue, MS, [facility] Genetic  Counselor [email or web address] Phone: [phone]  The patient was seen for a total of 15 minutes in face-to-face genetic counseling. Patient's husband was also present.  Dr. Grayland Ormond was available for discussion regarding this case.   _______________________________________________________________________ For Office Staff:  Number of people involved in session: 2 Was an Intern/ student involved with case: no

## 2021-10-14 ENCOUNTER — Inpatient Hospital Stay: Payer: PPO | Attending: Obstetrics and Gynecology

## 2021-10-14 ENCOUNTER — Telehealth: Payer: Self-pay

## 2021-10-14 DIAGNOSIS — Z8639 Personal history of other endocrine, nutritional and metabolic disease: Secondary | ICD-10-CM | POA: Diagnosis not present

## 2021-10-14 DIAGNOSIS — Z5189 Encounter for other specified aftercare: Secondary | ICD-10-CM | POA: Insufficient documentation

## 2021-10-14 DIAGNOSIS — Z5111 Encounter for antineoplastic chemotherapy: Secondary | ICD-10-CM | POA: Insufficient documentation

## 2021-10-14 DIAGNOSIS — C562 Malignant neoplasm of left ovary: Secondary | ICD-10-CM | POA: Insufficient documentation

## 2021-10-14 DIAGNOSIS — R350 Frequency of micturition: Secondary | ICD-10-CM | POA: Insufficient documentation

## 2021-10-14 MED ORDER — PEGFILGRASTIM-CBQV 6 MG/0.6ML ~~LOC~~ SOSY
6.0000 mg | PREFILLED_SYRINGE | Freq: Once | SUBCUTANEOUS | Status: AC
  Administered 2021-10-14: 6 mg via SUBCUTANEOUS

## 2021-10-14 NOTE — Telephone Encounter (Signed)
Telephone call to patient for follow up after receiving first infusion.   No answer but left message stating we were calling to check on them.  Encouraged patient to call for any questions or concerns.   

## 2021-10-18 ENCOUNTER — Encounter: Payer: Self-pay | Admitting: Internal Medicine

## 2021-10-18 DIAGNOSIS — C562 Malignant neoplasm of left ovary: Secondary | ICD-10-CM | POA: Diagnosis not present

## 2021-10-19 ENCOUNTER — Other Ambulatory Visit: Payer: Self-pay

## 2021-10-19 DIAGNOSIS — C562 Malignant neoplasm of left ovary: Secondary | ICD-10-CM

## 2021-10-20 ENCOUNTER — Inpatient Hospital Stay (HOSPITAL_BASED_OUTPATIENT_CLINIC_OR_DEPARTMENT_OTHER): Payer: PPO | Admitting: Nurse Practitioner

## 2021-10-20 ENCOUNTER — Encounter: Payer: Self-pay | Admitting: Nurse Practitioner

## 2021-10-20 ENCOUNTER — Inpatient Hospital Stay: Payer: PPO

## 2021-10-20 VITALS — BP 128/66 | HR 71 | Resp 16 | Wt 172.9 lb

## 2021-10-20 DIAGNOSIS — C562 Malignant neoplasm of left ovary: Secondary | ICD-10-CM

## 2021-10-20 DIAGNOSIS — Z5111 Encounter for antineoplastic chemotherapy: Secondary | ICD-10-CM | POA: Diagnosis not present

## 2021-10-20 LAB — CBC WITH DIFFERENTIAL/PLATELET
Abs Immature Granulocytes: 1.29 10*3/uL — ABNORMAL HIGH (ref 0.00–0.07)
Basophils Absolute: 0 10*3/uL (ref 0.0–0.1)
Basophils Relative: 0 %
Eosinophils Absolute: 0.5 10*3/uL (ref 0.0–0.5)
Eosinophils Relative: 3 %
HCT: 38.3 % (ref 36.0–46.0)
Hemoglobin: 12.7 g/dL (ref 12.0–15.0)
Immature Granulocytes: 7 %
Lymphocytes Relative: 41 %
Lymphs Abs: 7.4 10*3/uL — ABNORMAL HIGH (ref 0.7–4.0)
MCH: 30.8 pg (ref 26.0–34.0)
MCHC: 33.2 g/dL (ref 30.0–36.0)
MCV: 92.7 fL (ref 80.0–100.0)
Monocytes Absolute: 1.4 10*3/uL — ABNORMAL HIGH (ref 0.1–1.0)
Monocytes Relative: 8 %
Neutro Abs: 7.2 10*3/uL (ref 1.7–7.7)
Neutrophils Relative %: 41 %
Platelets: 194 10*3/uL (ref 150–400)
RBC: 4.13 MIL/uL (ref 3.87–5.11)
RDW: 13.2 % (ref 11.5–15.5)
Smear Review: NORMAL
WBC: 17.8 10*3/uL — ABNORMAL HIGH (ref 4.0–10.5)
nRBC: 0.2 % (ref 0.0–0.2)

## 2021-10-20 LAB — COMPREHENSIVE METABOLIC PANEL
ALT: 19 U/L (ref 0–44)
AST: 26 U/L (ref 15–41)
Albumin: 3.8 g/dL (ref 3.5–5.0)
Alkaline Phosphatase: 87 U/L (ref 38–126)
Anion gap: 8 (ref 5–15)
BUN: 15 mg/dL (ref 8–23)
CO2: 25 mmol/L (ref 22–32)
Calcium: 8.9 mg/dL (ref 8.9–10.3)
Chloride: 105 mmol/L (ref 98–111)
Creatinine, Ser: 0.91 mg/dL (ref 0.44–1.00)
GFR, Estimated: >60 mL/min (ref >60)
Glucose, Bld: 149 mg/dL — ABNORMAL HIGH (ref 70–99)
Potassium: 3.7 mmol/L (ref 3.5–5.1)
Sodium: 138 mmol/L (ref 135–145)
Total Bilirubin: 0.4 mg/dL (ref 0.3–1.2)
Total Protein: 6.4 g/dL — ABNORMAL LOW (ref 6.5–8.1)

## 2021-10-20 NOTE — Progress Notes (Signed)
[facility] NOTE  Patient Care Team: [name], PA-C as PCP - General (Family Medicine) End, Harrell Gave, MD as PCP - Cardiology (Cardiology) Ralene Bathe, MD (Dermatology) Arelia Sneddon, Dalton (Optometry) Vladimir Crofts, MD as Consulting Physician (Neurology) Beverly Gust, MD (Otolaryngology) Clent Jacks, RN as Oncology Nurse Navigator  CHIEF COMPLAINTS/PURPOSE OF CONSULTATION: Ovarian cancer  Oncology History Overview Note  s/p prior hysterectomy (ovaries in situ); Dr. Matilde Sprang for ovarian mass.   incontinence.MRI 08/04/21- Reproductive: Large cystic and solid pelvic mass centered in the pelvis. Ovarian tissue or ovary is not demonstrated on the current study. Mass in total measuring 15 x 11 x 12 cm. Soft tissue component showing restricted diffusion and enhancement centered in the lower portion of the mass shows heterogeneous T2 signal and measures 10.8 x 6.8 x 8.5 cm amidst cystic areas. There is a small amount of associated ascites. The mass abuts the vaginal apex following hysterectomy, potentially involving the LEFT vaginal apex. The "soft tissue component displays very heterogeneous enhancement and there are numerous enhancing septations throughout. Collateral pathways from LEFT ovarian vein are noted about the LEFT lateral aspect of the mass. Given that the gonadal vein can be traced to the mass suspicion is for mass of LEFT ovarian origin though this may of parasitized flow from ovarian. 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 Theora Gianotti; pelvic washings negative]   # AUG 30th, 2023- Carbo-taxol; with GCSF   # PSVT [Dr.Gollan] on metoprolol   Ovarian cancer on left ([facility])  09/28/2021 Initial Diagnosis   Ovarian cancer on left ([place])   09/28/2021 Cancer Staging   Staging form:  Ovary, Fallopian Tube, and Primary Peritoneal Carcinoma, AJCC 8th Edition - Clinical: Stage IC (cT1c1, cN0, cM0) - Signed by Cammie Sickle, MD on 09/28/2021   10/13/2021 -  Chemotherapy   Patient is on Treatment Plan : OVARIAN Carboplatin (AUC 6) + Paclitaxel (175) q21d X 6 Cycles        HISTORY OF PRESENTING ILLNESS: Ambulating independently.  Alone.   [name] 76 y.o. female with high-grade serous ovarian cancer stage Ic is here for follow up after her recent chemotherapy. She reports diarrhea since her surgery. Liquid to loose 2-3 times a day. Surgical wound is healing well.   Review of Systems  Constitutional:  Positive for malaise/fatigue. Negative for chills, diaphoresis, fever and weight loss.  HENT:  Negative for nosebleeds and sore throat.   Eyes:  Negative for double vision.  Respiratory:  Negative for cough, hemoptysis, sputum production, shortness of breath and wheezing.   Cardiovascular:  Negative for chest pain, palpitations, orthopnea and leg swelling.  Gastrointestinal:  Positive for diarrhea. Negative for abdominal pain, blood in stool, constipation, heartburn, melena, nausea and vomiting.  Genitourinary:  Negative for dysuria, frequency and urgency.  Musculoskeletal:  Negative for back pain and joint pain.  Skin:  Negative for itching and rash.  Neurological:  Negative for dizziness, tingling, focal weakness, weakness and headaches.  Endo/Heme/Allergies:  Does not bruise/bleed easily.  Psychiatric/Behavioral:  Negative for depression. The patient is not nervous/anxious and does not have insomnia.     MEDICAL HISTORY:  Past Medical History:  Diagnosis Date   Allergy    Arthritis    Asthma    Atypical chest pain 03/14/2019   Atypical mole 06/12/2018  right prox lat thigh/mod   Atypical mole 05/19/2014   left sup med buttock/excision   Atypical mole 03/26/2014   right sup lat buttock   Atypical mole 03/26/2013   left lat mid back   Atypical mole  11/08/2006   right shoulder superior/excision   Basal cell carcinoma 06/12/2016   right medial sup canthus below eyebrow   Basal cell carcinoma 09/23/2014   left nasal bridge   Basal cell carcinoma 03/26/2013   left nasal sidewall   Basal cell carcinoma 08/27/2012   left nasal dorsum   Cancer (HCC)    shoulder, nose skin CA- basal cell CA   GERD (gastroesophageal reflux disease)    occasional   Hyperlipidemia    Osteopenia    Polyp of transverse colon    Right arm weakness 03/14/2019   Stress due to illness of family member 05/29/2018    SURGICAL HISTORY: Past Surgical History:  Procedure Laterality Date   BREAST BIOPSY Right    core bx- neg   COLONOSCOPY     COLONOSCOPY WITH PROPOFOL N/A 10/08/2018   Procedure: COLONOSCOPY WITH PROPOFOL;  Surgeon: Lucilla Lame, MD;  Location: [facility] ENDOSCOPY;  Service: Endoscopy;  Laterality: N/A;   MOHS SURGERY     TONSILLECTOMY     VAGINAL HYSTERECTOMY      SOCIAL HISTORY: Social History   Socioeconomic History   Marital status: Married    Spouse name: [name]   Number of children: 2   Years of education: Not on file   Highest education level: Bachelor's degree (e.g., BA, AB, BS)  Occupational History   Occupation: Retired  Tobacco Use   Smoking status: Former    Packs/day: 1.50    Years: 20.00    Total pack years: 30.00    Types: Cigarettes    Quit date: 1993    Years since quitting: 30.7    Passive exposure: Past   Smokeless tobacco: Never  Vaping Use   Vaping Use: Never used  Substance and Sexual Activity   Alcohol use: Yes    Alcohol/week: 1.0 standard drink of alcohol    Types: 1 Glasses of wine per week    Comment: one glass of wine once a month   Drug use: Never   Sexual activity: Not Currently  Other Topics Concern   Not on file  Social History Narrative   Lives with husband in [place]; quit smoking > 30 years; once every 1-2 months; retd. Teacher- elementary school;    Social Determinants of Health    Financial Resource Strain: Low Risk  (03/03/2021)   Overall Financial Resource Strain (CARDIA)    Difficulty of Paying Living Expenses: Not hard at all  Food Insecurity: No Food Insecurity (03/03/2021)   Hunger Vital Sign    Worried About Running Out of Food in the Last Year: Never true    Ran Out of Food in the Last Year: Never true  Transportation Needs: No Transportation Needs (03/03/2021)   PRAPARE - Hydrologist (Medical): No    Lack of Transportation (Non-Medical): No  Physical Activity: Sufficiently Active (03/03/2021)   Exercise Vital Sign    Days of Exercise per Week: 7 days    Minutes of Exercise per Session: 30 min  Stress: No Stress Concern Present (03/03/2021)   [place]    Feeling of Stress : Not at all  Social Connections: Moderately Integrated (03/03/2021)   Social Connection and Isolation Panel [  NHANES]    Frequency of Communication with Friends and Family: More than three times a week    Frequency of Social Gatherings with Friends and Family: Three times a week    Attends Religious Services: More than 4 times per year    Active Member of Clubs or Organizations: No    Attends Archivist Meetings: Never    Marital Status: Married  Human resources officer Violence: Not At Risk (03/03/2021)   Humiliation, Afraid, Rape, and Kick questionnaire    Fear of Current or Ex-Partner: No    Emotionally Abused: No    Physically Abused: No    Sexually Abused: No    FAMILY HISTORY: Family History  Problem Relation Age of Onset   Lung cancer Mother    Diabetes Father    Parkinson's disease Father    Alzheimer's disease Father    Heart attack Father 22   Alcohol abuse Sister    Heart disease Sister    Lung cancer Sister 90   Down syndrome Sister    Colon cancer Paternal Uncle        d. 60s   Melanoma Maternal Grandmother    Stroke Paternal Grandmother    Lung cancer  Paternal Grandmother        d. 42s   Stomach cancer Paternal Grandfather        d. 51s   Liver cancer Son 54   Esophageal cancer Neg Hx    Breast cancer Neg Hx     ALLERGIES:  is allergic to tetanus toxoids and sulfa antibiotics.  MEDICATIONS:  Current Outpatient Medications  Medication Sig Dispense Refill   acetaminophen (TYLENOL) 325 MG tablet Take 650 mg by mouth every 6 (six) hours as needed.     Ascorbic Acid (VITAMIN C) 1000 MG tablet Take 1,000 mg by mouth daily.     calcium carbonate (OS-CAL - DOSED IN MG OF ELEMENTAL CALCIUM) 1250 (500 Ca) MG tablet Take 1 tablet by mouth daily with breakfast.     escitalopram (LEXAPRO) 20 MG tablet Take 1 tablet (20 mg total) by mouth daily. 90 tablet 3   famotidine (PEPCID) 10 MG tablet Take 1 tablet (10 mg total) by mouth 2 (two) times daily as needed for heartburn or indigestion.     hydrOXYzine (ATARAX/VISTARIL) 25 MG tablet TAKE 1-2 TABLETS (25-50 MG TOTAL) BY MOUTH AT BEDTIME AS NEEDED FOR ANXIETY (INSOMNIA). 180 tablet 2   lovastatin (MEVACOR) 20 MG tablet TAKE 1 TABLET BY MOUTH EVERYDAY AT BEDTIME 90 tablet 3   melatonin 3 MG TABS tablet Take 3 mg by mouth at bedtime.     vitamin B-12 (CYANOCOBALAMIN) 1000 MCG tablet Take 1,000 mcg by mouth daily.     LORazepam (ATIVAN) 0.5 MG tablet Take 1 tablet (0.5 mg total) by mouth every 6 (six) hours as needed for anxiety. (Patient not taking: Reported on 10/20/2021) 3 tablet 0   metoprolol succinate (TOPROL-XL) 25 MG 24 hr tablet Take by mouth. (Patient not taking: Reported on 10/20/2021)     ondansetron (ZOFRAN) 8 MG tablet One pill every 8 hours as needed for nausea/vomitting. (Patient not taking: Reported on 10/20/2021) 40 tablet 1   prochlorperazine (COMPAZINE) 10 MG tablet Take 1 tablet (10 mg total) by mouth every 6 (six) hours as needed for nausea or vomiting. (Patient not taking: Reported on 10/20/2021) 40 tablet 1   No current facility-administered medications for this visit.    PHYSICAL  EXAMINATION: ECOG PERFORMANCE STATUS: 1 - Symptomatic but completely  ambulatory  Vitals:   10/20/21 1052  BP: 128/66  Pulse: 71  Resp: 16  SpO2: 95%   Filed Weights   10/20/21 1052  Weight: 172 lb 14.4 oz (78.4 kg)    Physical Exam Vitals and nursing note reviewed.  HENT:     Head: Normocephalic and atraumatic.     Mouth/Throat:     Pharynx: Oropharynx is clear.  Eyes:     Extraocular Movements: Extraocular movements intact.     Pupils: Pupils are equal, round, and reactive to light.  Cardiovascular:     Rate and Rhythm: Normal rate and regular rhythm.  Pulmonary:     Comments: Decreased breath sounds bilaterally.  Abdominal:     Palpations: Abdomen is soft.  Musculoskeletal:        General: Normal range of motion.     Cervical back: Normal range of motion.  Skin:    General: Skin is warm.  Neurological:     General: No focal deficit present.     Mental Status: She is alert and oriented to person, place, and time.  Psychiatric:        Behavior: Behavior normal.        Judgment: Judgment normal.    LABORATORY DATA:  I have reviewed the data as listed Lab Results  Component Value Date   WBC 17.8 (H) 10/20/2021   HGB 12.7 10/20/2021   HCT 38.3 10/20/2021   MCV 92.7 10/20/2021   PLT 194 10/20/2021   Recent Labs    09/28/21 1428 10/13/21 0812 10/20/21 1016  NA 136 138 138  K 3.9 3.8 3.7  CL 102 106 105  CO2 26 25 25   GLUCOSE 111* 172* 149*  BUN 14 12 15   CREATININE 0.96 0.89 0.91  CALCIUM 8.6* 8.5* 8.9  GFRNONAA >60 >60 >60  PROT 6.4* 6.3* 6.4*  ALBUMIN 3.8 3.6 3.8  AST 21 22 26   ALT 22 17 19   ALKPHOS 57 57 87  BILITOT 0.4 0.2* 0.4     RADIOGRAPHIC STUDIES: I have personally reviewed the radiological images as listed and agreed with the findings in the report. No results found.  Assessment & Plan:   Ovarian cancer on left [facility]) #High-grade serous ovarian cancer-left side-pT1c [stage Ic-spillage during surgery]. Adjuvant chemotherapy with  Carbo-Taxol every 3 weeks x 6 cycles. S/p cycle 1. Tolerated moderately well- diarrhea. Eating and drinking well. Declines IV fluids.   Chemotherapy induced diarrhea- reviewed use of imodium.    GCSF arthralgias- mild. Encouraged claritin/nsaids.     Mild hypocalcemia- Calcium normalized today to 8.9. Continue calcium 600; add 2000 units of vitamin D; check vitamin D levels- normal. Monitor.    Post BF- 172; non-diabetic. Today, 149.Monitor for now. hemoglobin A1c at next visit.   IV access: PIV.    Genetics: s/p evaluation with Brianna on 10/13/21. [facility] Multi-Cancer+RNA panel and [facility] HRD pending.    Vaccination: Okay with flu shot and covid booster  Goals of Care- treatment given with curative intent   DISPOSITION: Follow up with Dr. Rogue Bussing as scheduled    No problem-specific Assessment & Plan notes found for this encounter.  Thank you Dr. for allowing me to participate in the care of your pleasant patient. Please do not hesitate to contact me with questions or concerns in the interim.  Above plan of care was discussed with patient/family in detail.  My contact information was given to the patient/family.     Verlon Au, NP 10/20/2021

## 2021-10-20 NOTE — Progress Notes (Signed)
diarrhea since surgery 2-3 times a day

## 2021-10-21 LAB — VITAMIN D 25 HYDROXY (VIT D DEFICIENCY, FRACTURES): Vit D, 25-Hydroxy: 41.28 ng/mL (ref 30–100)

## 2021-10-24 ENCOUNTER — Ambulatory Visit: Payer: PPO | Admitting: Dermatology

## 2021-11-01 ENCOUNTER — Telehealth: Payer: Self-pay | Admitting: Licensed Clinical Social Worker

## 2021-11-02 ENCOUNTER — Encounter: Payer: Self-pay | Admitting: Internal Medicine

## 2021-11-02 ENCOUNTER — Ambulatory Visit: Payer: PPO

## 2021-11-02 ENCOUNTER — Other Ambulatory Visit: Payer: PPO

## 2021-11-02 ENCOUNTER — Encounter: Payer: Self-pay | Admitting: Licensed Clinical Social Worker

## 2021-11-02 ENCOUNTER — Ambulatory Visit: Payer: PPO | Admitting: Internal Medicine

## 2021-11-02 ENCOUNTER — Ambulatory Visit: Payer: Self-pay | Admitting: Licensed Clinical Social Worker

## 2021-11-02 DIAGNOSIS — Z1379 Encounter for other screening for genetic and chromosomal anomalies: Secondary | ICD-10-CM

## 2021-11-02 MED FILL — Dexamethasone Sodium Phosphate Inj 100 MG/10ML: INTRAMUSCULAR | Qty: 1 | Status: AC

## 2021-11-02 NOTE — Telephone Encounter (Signed)
I contacted [name] to discuss her genetic testing results. No pathogenic variants were identified in the 84 genes analyzed. HRD testing also came back negative. Detailed clinic note to follow.   The test report has been scanned into EPIC and is located under the Molecular Pathology section of the Results Review tab.  A portion of the result report is included below for reference.        Elizabeth Rogue, MS, [facility] Genetic Counselor [name] Phone: [phone]\

## 2021-11-02 NOTE — Progress Notes (Signed)
HPI:   [name] was previously seen in the [facility] clinic due to a personal and family history of cancer and concerns regarding a hereditary predisposition to cancer. Please refer to our prior cancer genetics clinic note for more information regarding our discussion, assessment and recommendations, at the time. [name] recent genetic test results were disclosed to her, as were recommendations warranted by these results. These results and recommendations are discussed in more detail below.  CANCER HISTORY:  Oncology History Overview Note  s/p prior hysterectomy (ovaries in situ); Dr. Matilde Sprang for ovarian mass.   incontinence.MRI 08/04/21- Reproductive: Large cystic and solid pelvic mass centered in the pelvis. Ovarian tissue or ovary is not demonstrated on the current study. Mass in total measuring 15 x 11 x 12 cm. Soft tissue component showing restricted diffusion and enhancement centered in the lower portion of the mass shows heterogeneous T2 signal and measures 10.8 x 6.8 x 8.5 cm amidst cystic areas. There is a small amount of associated ascites. The mass abuts the vaginal apex following hysterectomy, potentially involving the LEFT vaginal apex. The "soft tissue component displays very heterogeneous enhancement and there are numerous enhancing septations throughout. Collateral pathways from LEFT ovarian vein are noted about the LEFT lateral aspect of the mass. Given that the gonadal vein can be traced to the mass suspicion is for mass of LEFT ovarian origin though this may of parasitized flow from ovarian. 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 Theora Gianotti; pelvic washings negative]   # AUG 30th, 2023- Carbo-taxol; with GCSF   # PSVT [Dr.Gollan] on metoprolol   Ovarian cancer on left ([place])  09/28/2021  Initial Diagnosis   Ovarian cancer on left ([place])   09/28/2021 Cancer Staging   Staging form: Ovary, Fallopian Tube, and Primary Peritoneal Carcinoma, AJCC 8th Edition - Clinical: Stage IC (cT1c1, cN0, cM0) - Signed by Cammie Sickle, MD on 09/28/2021   10/13/2021 -  Chemotherapy   Patient is on Treatment Plan : OVARIAN Carboplatin (AUC 6) + Paclitaxel (175) q21d X 6 Cycles      Genetic Testing   Negative genetic testing. No pathogenic variants identified on the [facility] CancerNext-Expanded+RNA panel. The report date is 10/31/2021. HRD testing through [facility] was also negative and reported out 10/31/2021.  The Multi-Cancer Panel + RNA offered by [facility] includes sequencing and/or deletion duplication testing of the following 84 genes: AIP, ALK, APC, ATM, AXIN2,BAP1,  BARD1, BLM, BMPR1A, BRCA1, BRCA2, BRIP1, CASR, CDC73, CDH1, CDK4, CDKN1B, CDKN1C, CDKN2A (p14ARF), CDKN2A (p16INK4a), CEBPA, CHEK2, CTNNA1, DICER1, DIS3L2, EGFR (c.2369C>T, p.Thr790Met variant only), EPCAM (Deletion/duplication testing only), FH, FLCN, GATA2, GPC3, GREM1 (Promoter region deletion/duplication testing only), HOXB13 (c.251G>A, p.Gly84Glu), HRAS, KIT, MAX, MEN1, MET, MITF (c.952G>A, p.Glu318Lys variant only), MLH1, MSH2, MSH3, MSH6, MUTYH, NBN, NF1, NF2, NTHL1, PALB2, PDGFRA, PHOX2B, PMS2, POLD1, POLE, POT1, PRKAR1A, PTCH1, PTEN, RAD50, RAD51C, RAD51D, RB1, RECQL4, RET, RUNX1, SDHAF2, SDHA (sequence changes only), SDHB, SDHC, SDHD, SMAD4, SMARCA4, SMARCB1, SMARCE1, STK11, SUFU, TERC, TERT, TMEM127, TP53, TSC1, TSC2, VHL, WRN and WT1.     FAMILY HISTORY:  We obtained a detailed, 4-generation family history.  Significant diagnoses are listed below: Family History  Problem Relation Age of Onset   Lung cancer Mother    Diabetes Father    Parkinson's disease Father    Alzheimer's disease Father  Heart attack Father 58   Alcohol abuse Sister    Heart disease Sister    Lung cancer Sister 36   Down syndrome  Sister    Colon cancer Paternal Uncle        d. 40s   Melanoma Maternal Grandmother    Stroke Paternal Grandmother    Lung cancer Paternal Grandmother        d. 82s   Stomach cancer Paternal Grandfather        d. 43s   Liver cancer Son 40   Esophageal cancer Neg Hx    Breast cancer Neg Hx    [name] has 2 sons, 91 and 96. Her older son has liver cancer that has metastasized to his lung. [name] had 2 sisters. One passed at 52 of lung cancer. Her other sister has Down syndrome.   [name] mother passed at 19 and had lung cancer. Maternal grandmother had history of melanoma. No other known cancers on this side of the family.   [name] father had history of skin cancer, he passed at 26. A paternal uncle had colon cancer in his early 46s and passed in his 76s. Paternal grandmother had lung cancer and passed in her 25s, grandfather had stomach cancer and passed in his 47s.   [name] is unaware of previous family history of genetic testing for hereditary cancer risks. There is no reported Ashkenazi Jewish ancestry. There is no known consanguinity.      GENETIC TEST RESULTS:  The [facility] Multi-Cancer+RNA Panel found no pathogenic mutations.   The Multi-Cancer Panel + RNA offered by [facility] includes sequencing and/or deletion duplication testing of the following 84 genes: AIP, ALK, APC, ATM, AXIN2,BAP1,  BARD1, BLM, BMPR1A, BRCA1, BRCA2, BRIP1, CASR, CDC73, CDH1, CDK4, CDKN1B, CDKN1C, CDKN2A (p14ARF), CDKN2A (p16INK4a), CEBPA, CHEK2, CTNNA1, DICER1, DIS3L2, EGFR (c.2369C>T, p.Thr790Met variant only), EPCAM (Deletion/duplication testing only), FH, FLCN, GATA2, GPC3, GREM1 (Promoter region deletion/duplication testing only), HOXB13 (c.251G>A, p.Gly84Glu), HRAS, KIT, MAX, MEN1, MET, MITF (c.952G>A, p.Glu318Lys variant only), MLH1, MSH2, MSH3, MSH6, MUTYH, NBN, NF1, NF2, NTHL1, PALB2, PDGFRA, PHOX2B, PMS2, POLD1, POLE, POT1, PRKAR1A, PTCH1, PTEN, RAD50, RAD51C, RAD51D, RB1, RECQL4,  RET, RUNX1, SDHAF2, SDHA (sequence changes only), SDHB, SDHC, SDHD, SMAD4, SMARCA4, SMARCB1, SMARCE1, STK11, SUFU, TERC, TERT, TMEM127, TP53, TSC1, TSC2, VHL, WRN and WT1.   The test report has been scanned into EPIC and is located under the Molecular Pathology section of the Results Review tab.  A portion of the result report is included below for reference. Genetic testing reported out on 10/31/2021.      HRD testing also reported out negative on 10/31/2021.    Genetic testing identified a variant of uncertain significance (VUS) in the TERT gene called c.724G>C.  At this time, it is unknown if this variant is associated with an increased risk for cancer or if it is benign, but most uncertain variants are reclassified to benign. It should not be used to make medical management decisions. With time, we suspect the laboratory will determine the significance of this variant, if any. If the laboratory reclassifies this variant, we will attempt to contact [name] to discuss it further.   Even though a pathogenic variant was not identified, possible explanations for the cancer in the family may include: There may be no hereditary risk for cancer in the family. The cancers in [name] and/or her family may be sporadic/familial or due to other genetic and environmental factors. There may be a gene mutation  in one of these genes that current testing methods cannot detect but that chance is small. There could be another gene that has not yet been discovered, or that we have not yet tested, that is responsible for the cancer diagnoses in the family.  It is also possible there is a hereditary cause for the cancer in the family that [name] did not inherit.  Therefore, it is important to remain in touch with cancer genetics in the future so that we can continue to offer [name] the most up to date genetic testing.   ADDITIONAL GENETIC TESTING:  We discussed with [name] that her genetic testing  was fairly extensive.  If there are additional relevant genes identified to increase cancer risk that can be analyzed in the future, we would be happy to discuss and coordinate this testing at that time.    CANCER SCREENING RECOMMENDATIONS:  [name] test result is considered negative (normal).  This means that we have not identified a hereditary cause for her personal and family history of cancer at this time.   An individual's cancer risk and medical management are not determined by genetic test results alone. Overall cancer risk assessment incorporates additional factors, including personal medical history, family history, and any available genetic information that may result in a personalized plan for cancer prevention and surveillance. Therefore, it is recommended she continue to follow the cancer management and screening guidelines provided by her oncology and primary healthcare provider.  RECOMMENDATIONS FOR FAMILY MEMBERS:   Since she did not inherit a identifiable mutation in a cancer predisposition gene included on this panel, her children could not have inherited a known mutation from her in one of these genes. Individuals in this family might be at some increased risk of developing cancer, over the general population risk, due to the family history of cancer.  Individuals in the family should notify their providers of the family history of cancer. We recommend women in this family have a yearly mammogram beginning at age 19, or 65 years younger than the earliest onset of cancer, an annual clinical breast exam, and perform monthly breast self-exams.  Family members should have colonoscopies by at age 83, or earlier, as recommended by their providers. We do not recommend familial testing for the TERT variant of uncertain significance (VUS).  FOLLOW-UP:  Lastly, we discussed with [name] that cancer genetics is a rapidly advancing field and it is possible that new genetic tests will be  appropriate for her and/or her family members in the future. We encouraged her to remain in contact with cancer genetics on an annual basis so we can update her personal and family histories and let her know of advances in cancer genetics that may benefit this family.   Our contact number was provided. [name] questions were answered to her satisfaction, and she knows she is welcome to call us at anytime with additional questions or concerns.    Faith Rogue, MS, [facility] Genetic Counselor [email or web address] Phone: [phone]

## 2021-11-03 ENCOUNTER — Encounter: Payer: Self-pay | Admitting: Internal Medicine

## 2021-11-03 ENCOUNTER — Inpatient Hospital Stay: Payer: PPO

## 2021-11-03 ENCOUNTER — Other Ambulatory Visit: Payer: PPO

## 2021-11-03 ENCOUNTER — Inpatient Hospital Stay (HOSPITAL_BASED_OUTPATIENT_CLINIC_OR_DEPARTMENT_OTHER): Payer: PPO | Admitting: Internal Medicine

## 2021-11-03 ENCOUNTER — Other Ambulatory Visit: Payer: Self-pay | Admitting: Lab

## 2021-11-03 ENCOUNTER — Telehealth: Payer: Self-pay

## 2021-11-03 ENCOUNTER — Ambulatory Visit: Payer: PPO | Admitting: Internal Medicine

## 2021-11-03 ENCOUNTER — Ambulatory Visit: Payer: PPO

## 2021-11-03 VITALS — BP 123/78 | HR 74 | Temp 98.5°F | Resp 18 | Wt 174.6 lb

## 2021-11-03 VITALS — BP 132/76 | HR 70

## 2021-11-03 DIAGNOSIS — R350 Frequency of micturition: Secondary | ICD-10-CM

## 2021-11-03 DIAGNOSIS — C562 Malignant neoplasm of left ovary: Secondary | ICD-10-CM | POA: Diagnosis not present

## 2021-11-03 DIAGNOSIS — Z5111 Encounter for antineoplastic chemotherapy: Secondary | ICD-10-CM | POA: Diagnosis not present

## 2021-11-03 LAB — URINALYSIS, COMPLETE (UACMP) WITH MICROSCOPIC
Bilirubin Urine: NEGATIVE
Glucose, UA: NEGATIVE mg/dL
Hgb urine dipstick: NEGATIVE
Ketones, ur: NEGATIVE mg/dL
Nitrite: NEGATIVE
Protein, ur: NEGATIVE mg/dL
Specific Gravity, Urine: 1.01 (ref 1.005–1.030)
pH: 5.5 (ref 5.0–8.0)

## 2021-11-03 LAB — COMPREHENSIVE METABOLIC PANEL
ALT: 12 U/L (ref 0–44)
AST: 22 U/L (ref 15–41)
Albumin: 3.9 g/dL (ref 3.5–5.0)
Alkaline Phosphatase: 73 U/L (ref 38–126)
Anion gap: 7 (ref 5–15)
BUN: 12 mg/dL (ref 8–23)
CO2: 24 mmol/L (ref 22–32)
Calcium: 9 mg/dL (ref 8.9–10.3)
Chloride: 107 mmol/L (ref 98–111)
Creatinine, Ser: 0.9 mg/dL (ref 0.44–1.00)
GFR, Estimated: >60 mL/min (ref >60)
Glucose, Bld: 135 mg/dL — ABNORMAL HIGH (ref 70–99)
Potassium: 4 mmol/L (ref 3.5–5.1)
Sodium: 138 mmol/L (ref 135–145)
Total Bilirubin: 0.4 mg/dL (ref 0.3–1.2)
Total Protein: 6.7 g/dL (ref 6.5–8.1)

## 2021-11-03 LAB — CBC WITH DIFFERENTIAL/PLATELET
Abs Immature Granulocytes: 0.03 10*3/uL (ref 0.00–0.07)
Basophils Absolute: 0.1 10*3/uL (ref 0.0–0.1)
Basophils Relative: 1 %
Eosinophils Absolute: 0.1 10*3/uL (ref 0.0–0.5)
Eosinophils Relative: 1 %
HCT: 39.7 % (ref 36.0–46.0)
Hemoglobin: 13.4 g/dL (ref 12.0–15.0)
Immature Granulocytes: 0 %
Lymphocytes Relative: 37 %
Lymphs Abs: 3.8 10*3/uL (ref 0.7–4.0)
MCH: 31.2 pg (ref 26.0–34.0)
MCHC: 33.8 g/dL (ref 30.0–36.0)
MCV: 92.5 fL (ref 80.0–100.0)
Monocytes Absolute: 0.5 10*3/uL (ref 0.1–1.0)
Monocytes Relative: 5 %
Neutro Abs: 5.8 10*3/uL (ref 1.7–7.7)
Neutrophils Relative %: 56 %
Platelets: 226 10*3/uL (ref 150–400)
RBC: 4.29 MIL/uL (ref 3.87–5.11)
RDW: 14.6 % (ref 11.5–15.5)
WBC: 10.3 10*3/uL (ref 4.0–10.5)
nRBC: 0 % (ref 0.0–0.2)

## 2021-11-03 MED ORDER — SODIUM CHLORIDE 0.9 % IV SOLN
10.0000 mg | Freq: Once | INTRAVENOUS | Status: AC
  Administered 2021-11-03: 10 mg via INTRAVENOUS
  Filled 2021-11-03: qty 10

## 2021-11-03 MED ORDER — FAMOTIDINE IN NACL 20-0.9 MG/50ML-% IV SOLN
20.0000 mg | Freq: Once | INTRAVENOUS | Status: AC
  Administered 2021-11-03: 20 mg via INTRAVENOUS
  Filled 2021-11-03: qty 50

## 2021-11-03 MED ORDER — PALONOSETRON HCL INJECTION 0.25 MG/5ML
0.2500 mg | Freq: Once | INTRAVENOUS | Status: AC
  Administered 2021-11-03: 0.25 mg via INTRAVENOUS
  Filled 2021-11-03: qty 5

## 2021-11-03 MED ORDER — SODIUM CHLORIDE 0.9 % IV SOLN
175.0000 mg/m2 | Freq: Once | INTRAVENOUS | Status: AC
  Administered 2021-11-03: 330 mg via INTRAVENOUS
  Filled 2021-11-03: qty 55

## 2021-11-03 MED ORDER — SODIUM CHLORIDE 0.9 % IV SOLN
150.0000 mg | Freq: Once | INTRAVENOUS | Status: AC
  Administered 2021-11-03: 150 mg via INTRAVENOUS
  Filled 2021-11-03: qty 150

## 2021-11-03 MED ORDER — SODIUM CHLORIDE 0.9 % IV SOLN
Freq: Once | INTRAVENOUS | Status: AC
  Filled 2021-11-03: qty 250

## 2021-11-03 MED ORDER — DIPHENHYDRAMINE HCL 50 MG/ML IJ SOLN
50.0000 mg | Freq: Once | INTRAMUSCULAR | Status: AC
  Administered 2021-11-03: 50 mg via INTRAVENOUS
  Filled 2021-11-03: qty 1

## 2021-11-03 MED ORDER — SODIUM CHLORIDE 0.9 % IV SOLN
425.5000 mg | Freq: Once | INTRAVENOUS | Status: AC
  Administered 2021-11-03: 430 mg via INTRAVENOUS
  Filled 2021-11-03: qty 43

## 2021-11-03 NOTE — Telephone Encounter (Signed)
PA approved. Pharmacy notified 

## 2021-11-03 NOTE — Patient Instructions (Signed)
[facility]  Discharge Instructions: Thank you for choosing [facility] to provide your oncology and hematology care.  If you have a lab appointment with the Cancer Center, please go directly to the Cancer Center and check in at the registration area.  Wear comfortable clothing and clothing appropriate for easy access to any Portacath or PICC line.   We strive to give you quality time with your provider. You may need to reschedule your appointment if you arrive late (15 or more minutes).  Arriving late affects you and other patients whose appointments are after yours.  Also, if you miss three or more appointments without notifying the office, you may be dismissed from the clinic at the provider's discretion.      For prescription refill requests, have your pharmacy contact our office and allow 72 hours for refills to be completed.       To help prevent nausea and vomiting after your treatment, we encourage you to take your nausea medication as directed.  BELOW ARE SYMPTOMS THAT SHOULD BE REPORTED IMMEDIATELY: *FEVER GREATER THAN 100.4 F (38 C) OR HIGHER *CHILLS OR SWEATING *NAUSEA AND VOMITING THAT IS NOT CONTROLLED WITH YOUR NAUSEA MEDICATION *UNUSUAL SHORTNESS OF BREATH *UNUSUAL BRUISING OR BLEEDING *URINARY PROBLEMS (pain or burning when urinating, or frequent urination) *BOWEL PROBLEMS (unusual diarrhea, constipation, pain near the anus) TENDERNESS IN MOUTH AND THROAT WITH OR WITHOUT PRESENCE OF ULCERS (sore throat, sores in mouth, or a toothache) UNUSUAL RASH, SWELLING OR PAIN  UNUSUAL VAGINAL DISCHARGE OR ITCHING   Items with * indicate a potential emergency and should be followed up as soon as possible or go to the Emergency Department if any problems should occur.  Please show the CHEMOTHERAPY ALERT CARD or IMMUNOTHERAPY ALERT CARD at check-in to the Emergency Department and triage nurse.  Should you have questions after your  visit or need to cancel or reschedule your appointment, please contact [facility]  [phone] and follow the prompts.  Office hours are 8:00 a.m. to 4:30 p.m. Monday - Friday. Please note that voicemails left after 4:00 p.m. may not be returned until the following business day.  We are closed weekends and major holidays. You have access to a nurse at all times for urgent questions. Please call the main number to the clinic [phone] and follow the prompts.  For any non-urgent questions, you may also contact your provider using MyChart. We now offer e-Visits for anyone 18 and older to request care online for non-urgent symptoms. For details visit [email or web address].   Also download the MyChart app! Go to the app store, search "MyChart", open the app, select [facility], and log in with your MyChart username and password.  Masks are optional in the cancer centers. If you would like for your care team to wear a mask while they are taking care of you, please let them know. For doctor visits, patients may have with them one support person who is at least 76 years old. At this time, visitors are not allowed in the infusion area.   

## 2021-11-03 NOTE — Progress Notes (Signed)
[place] NOTE  Patient Care Team: [name], PA-C as PCP - General (Family Medicine) End, Harrell Gave, MD as PCP - Cardiology (Cardiology) Ralene Bathe, MD (Dermatology) Arelia Sneddon, [place] (Optometry) Vladimir Crofts, MD as Consulting Physician (Neurology) Beverly Gust, MD (Otolaryngology) Clent Jacks, RN as Oncology Nurse Navigator  CHIEF COMPLAINTS/PURPOSE OF CONSULTATION: Ovarian cancer.   Oncology History Overview Note  s/p prior hysterectomy (ovaries in situ); Dr. Matilde Sprang for ovarian mass.   incontinence.MRI 08/04/21- Reproductive: Large cystic and solid pelvic mass centered in the pelvis. Ovarian tissue or ovary is not demonstrated on the current study. Mass in total measuring 15 x 11 x 12 cm. Soft tissue component showing restricted diffusion and enhancement centered in the lower portion of the mass shows heterogeneous T2 signal and measures 10.8 x 6.8 x 8.5 cm amidst cystic areas. There is a small amount of associated ascites. The mass abuts the vaginal apex following hysterectomy, potentially involving the LEFT vaginal apex. The "soft tissue component displays very heterogeneous enhancement and there are numerous enhancing septations throughout. Collateral pathways from LEFT ovarian vein are noted about the LEFT lateral aspect of the mass. Given that the gonadal vein can be traced to the mass suspicion is for mass of LEFT ovarian origin though this may of parasitized flow from ovarian. 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 Theora Gianotti; pelvic washings negative]   # AUG 30th, 2023- Carbo-taxol; with GCSF   # PSVT [Dr.Gollan] on metoprolol   Ovarian cancer on left ([place])  09/28/2021 Initial Diagnosis   Ovarian cancer on left ([place])   09/28/2021 Cancer Staging   Staging  form: Ovary, Fallopian Tube, and Primary Peritoneal Carcinoma, AJCC 8th Edition - Clinical: Stage IC (cT1c1, cN0, cM0) - Signed by Cammie Sickle, MD on 09/28/2021   10/13/2021 -  Chemotherapy   Patient is on Treatment Plan : OVARIAN Carboplatin (AUC 6) + Paclitaxel (175) q21d X 6 Cycles      Genetic Testing   Negative genetic testing. No pathogenic variants identified on the [facility] CancerNext-Expanded+RNA panel. The report date is 10/31/2021. HRD testing through [facility] was also negative and reported out 10/31/2021.  The Multi-Cancer Panel + RNA offered by [facility] includes sequencing and/or deletion duplication testing of the following 84 genes: AIP, ALK, APC, ATM, AXIN2,BAP1,  BARD1, BLM, BMPR1A, BRCA1, BRCA2, BRIP1, CASR, CDC73, CDH1, CDK4, CDKN1B, CDKN1C, CDKN2A (p14ARF), CDKN2A (p16INK4a), CEBPA, CHEK2, CTNNA1, DICER1, DIS3L2, EGFR (c.2369C>T, p.Thr790Met variant only), EPCAM (Deletion/duplication testing only), FH, FLCN, GATA2, GPC3, GREM1 (Promoter region deletion/duplication testing only), HOXB13 (c.251G>A, p.Gly84Glu), HRAS, KIT, MAX, MEN1, MET, MITF (c.952G>A, p.Glu318Lys variant only), MLH1, MSH2, MSH3, MSH6, MUTYH, NBN, NF1, NF2, NTHL1, PALB2, PDGFRA, PHOX2B, PMS2, POLD1, POLE, POT1, PRKAR1A, PTCH1, PTEN, RAD50, RAD51C, RAD51D, RB1, RECQL4, RET, RUNX1, SDHAF2, SDHA (sequence changes only), SDHB, SDHC, SDHD, SMAD4, SMARCA4, SMARCB1, SMARCE1, STK11, SUFU, TERC, TERT, TMEM127, TP53, TSC1, TSC2, VHL, WRN and WT1.      HISTORY OF PRESENTING ILLNESS: Ambulating independently.  Alone.   [name] 76 y.o.  female with high-grade serous ovarian cancer stage Ic is here to proceed with adjuvant chemotherapy.  Patient seen today prior to Cycle 2 of carbo/taxol.  She tolerated cycle 1 well.  She did have diarrhea a few days after which improved with 1 dose of Imodium.  Denies  any neuropathy. Started noticing hair loss. Reports urinary frequency.  Patient denies fever, chills,  nausea, vomiting, shortness of breath, cough, abdominal pain, bleeding, bowel or bladder issues. Energy level is good.  Appetite is good.  Denies any weight loss. Denies pain.   Review of Systems  Constitutional:  Negative for chills, diaphoresis, fever, malaise/fatigue and weight loss.  HENT:  Negative for nosebleeds and sore throat.   Eyes:  Negative for double vision.  Respiratory:  Negative for cough, hemoptysis, sputum production, shortness of breath and wheezing.   Cardiovascular:  Negative for chest pain, palpitations, orthopnea and leg swelling.  Gastrointestinal:  Negative for abdominal pain, blood in stool, constipation, diarrhea, heartburn, melena, nausea and vomiting.  Genitourinary:  Negative for dysuria, frequency and urgency.  Musculoskeletal:  Negative for back pain and joint pain.  Skin: Negative.  Negative for itching and rash.  Neurological:  Negative for dizziness, tingling, focal weakness, weakness and headaches.  Endo/Heme/Allergies:  Does not bruise/bleed easily.  Psychiatric/Behavioral:  Negative for depression. The patient is not nervous/anxious and does not have insomnia.     MEDICAL HISTORY:  Past Medical History:  Diagnosis Date  . Allergy   . Arthritis   . Asthma   . Atypical chest pain 03/14/2019  . Atypical mole 06/12/2018   right prox lat thigh/mod  . Atypical mole 05/19/2014   left sup med buttock/excision  . Atypical mole 03/26/2014   right sup lat buttock  . Atypical mole 03/26/2013   left lat mid back  . Atypical mole 11/08/2006   right shoulder superior/excision  . Basal cell carcinoma 06/12/2016   right medial sup canthus below eyebrow  . Basal cell carcinoma 09/23/2014   left nasal bridge  . Basal cell carcinoma 03/26/2013   left nasal sidewall  . Basal cell carcinoma 08/27/2012   left nasal dorsum  . Cancer (HCC)    shoulder, nose skin CA- basal cell CA  . GERD (gastroesophageal reflux disease)    occasional  . Hyperlipidemia    . Osteopenia   . Polyp of transverse colon   . Right arm weakness 03/14/2019  . Stress due to illness of family member 05/29/2018    SURGICAL HISTORY: Past Surgical History:  Procedure Laterality Date  . BREAST BIOPSY Right    core bx- neg  . COLONOSCOPY    . COLONOSCOPY WITH PROPOFOL N/A 10/08/2018   Procedure: COLONOSCOPY WITH PROPOFOL;  Surgeon: Lucilla Lame, MD;  Location: [facility] ENDOSCOPY;  Service: Endoscopy;  Laterality: N/A;  . MOHS SURGERY    . TONSILLECTOMY    . VAGINAL HYSTERECTOMY      SOCIAL HISTORY: Social History   Socioeconomic History  . Marital status: Married    Spouse name: Jenny Reichmann  . Number of children: 2  . Years of education: Not on file  . Highest education level: Bachelor's degree (e.g., BA, AB, BS)  Occupational History  . Occupation: Retired  Tobacco Use  . Smoking status: Former    Packs/day: 1.50    Years: 20.00    Total pack years: 30.00    Types: Cigarettes    Quit date: 1993    Years since quitting: 30.7    Passive exposure: Past  . Smokeless tobacco: Never  Vaping Use  . Vaping Use: Never used  Substance and Sexual Activity  . Alcohol use: Yes    Alcohol/week: 1.0 standard drink of alcohol    Types: 1 Glasses of wine per week    Comment: one glass of wine once  a month  . Drug use: Never  . Sexual activity: Not Currently  Other Topics Concern  . Not on file  Social History Narrative   Lives with husband in [place]; quit smoking > 30 years; once every 1-2 months; retd. Teacher- elementary school;    Social Determinants of Health   Financial Resource Strain: Low Risk  (03/03/2021)   Overall Financial Resource Strain (CARDIA)   . Difficulty of Paying Living Expenses: Not hard at all  Food Insecurity: No Food Insecurity (03/03/2021)   Hunger Vital Sign   . Worried About Charity fundraiser in the Last Year: Never true   . Ran Out of Food in the Last Year: Never true  Transportation Needs: No Transportation Needs (03/03/2021)    PRAPARE - Transportation   . Lack of Transportation (Medical): No   . Lack of Transportation (Non-Medical): No  Physical Activity: Sufficiently Active (03/03/2021)   Exercise Vital Sign   . Days of Exercise per Week: 7 days   . Minutes of Exercise per Session: 30 min  Stress: No Stress Concern Present (03/03/2021)   [place]   . Feeling of Stress : Not at all  Social Connections: Moderately Integrated (03/03/2021)   Social Connection and Isolation Panel [NHANES]   . Frequency of Communication with Friends and Family: More than three times a week   . Frequency of Social Gatherings with Friends and Family: Three times a week   . Attends Religious Services: More than 4 times per year   . Active Member of Clubs or Organizations: No   . Attends Archivist Meetings: Never   . Marital Status: Married  Human resources officer Violence: Not At Risk (03/03/2021)   Humiliation, Afraid, Rape, and Kick questionnaire   . Fear of Current or Ex-Partner: No   . Emotionally Abused: No   . Physically Abused: No   . Sexually Abused: No    FAMILY HISTORY: Family History  Problem Relation Age of Onset  . Lung cancer Mother   . Diabetes Father   . Parkinson's disease Father   . Alzheimer's disease Father   . Heart attack Father 70  . Alcohol abuse Sister   . Heart disease Sister   . Lung cancer Sister 22  . Down syndrome Sister   . Colon cancer Paternal Uncle        d. 63s  . Melanoma Maternal Grandmother   . Stroke Paternal Grandmother   . Lung cancer Paternal Grandmother        d. 28s  . Stomach cancer Paternal Grandfather        d. 5s  . Liver cancer Son 44  . Esophageal cancer Neg Hx   . Breast cancer Neg Hx     ALLERGIES:  is allergic to tetanus toxoids and sulfa antibiotics.  MEDICATIONS:  Current Outpatient Medications  Medication Sig Dispense Refill  . acetaminophen (TYLENOL) 325 MG tablet Take 650 mg by  mouth every 6 (six) hours as needed.    . Ascorbic Acid (VITAMIN C) 1000 MG tablet Take 1,000 mg by mouth daily.    . calcium carbonate (OS-CAL - DOSED IN MG OF ELEMENTAL CALCIUM) 1250 (500 Ca) MG tablet Take 1 tablet by mouth daily with breakfast.    . escitalopram (LEXAPRO) 20 MG tablet Take 1 tablet (20 mg total) by mouth daily. 90 tablet 3  . famotidine (PEPCID) 10 MG tablet Take 1 tablet (10 mg total) by mouth  2 (two) times daily as needed for heartburn or indigestion.    . hydrOXYzine (ATARAX/VISTARIL) 25 MG tablet TAKE 1-2 TABLETS (25-50 MG TOTAL) BY MOUTH AT BEDTIME AS NEEDED FOR ANXIETY (INSOMNIA). 180 tablet 2  . lovastatin (MEVACOR) 20 MG tablet TAKE 1 TABLET BY MOUTH EVERYDAY AT BEDTIME 90 tablet 3  . melatonin 3 MG TABS tablet Take 3 mg by mouth at bedtime.    . vitamin B-12 (CYANOCOBALAMIN) 1000 MCG tablet Take 1,000 mcg by mouth daily.    Marland Kitchen LORazepam (ATIVAN) 0.5 MG tablet Take 1 tablet (0.5 mg total) by mouth every 6 (six) hours as needed for anxiety. (Patient not taking: Reported on 10/20/2021) 3 tablet 0  . metoprolol succinate (TOPROL-XL) 25 MG 24 hr tablet Take by mouth. (Patient not taking: Reported on 10/20/2021)    . ondansetron (ZOFRAN) 8 MG tablet One pill every 8 hours as needed for nausea/vomitting. (Patient not taking: Reported on 10/20/2021) 40 tablet 1  . prochlorperazine (COMPAZINE) 10 MG tablet Take 1 tablet (10 mg total) by mouth every 6 (six) hours as needed for nausea or vomiting. (Patient not taking: Reported on 10/20/2021) 40 tablet 1   No current facility-administered medications for this visit.    PHYSICAL EXAMINATION: ECOG PERFORMANCE STATUS: 1 - Symptomatic but completely ambulatory  There were no vitals filed for this visit.  There were no vitals filed for this visit.   Physical Exam Vitals and nursing note reviewed.  HENT:     Head: Normocephalic and atraumatic.     Mouth/Throat:     Pharynx: Oropharynx is clear.  Eyes:     Extraocular Movements:  Extraocular movements intact.     Pupils: Pupils are equal, round, and reactive to light.  Cardiovascular:     Rate and Rhythm: Normal rate and regular rhythm.  Pulmonary:     Effort: Pulmonary effort is normal.     Breath sounds: Normal breath sounds.     Comments:   Abdominal:     Palpations: Abdomen is soft.  Musculoskeletal:        General: Normal range of motion.     Cervical back: Normal range of motion.  Skin:    General: Skin is warm.  Neurological:     General: No focal deficit present.     Mental Status: She is alert and oriented to person, place, and time.  Psychiatric:        Behavior: Behavior normal.        Judgment: Judgment normal.    LABORATORY DATA:  I have reviewed the data as listed Lab Results  Component Value Date   WBC 10.3 11/03/2021   HGB 13.4 11/03/2021   HCT 39.7 11/03/2021   MCV 92.5 11/03/2021   PLT 226 11/03/2021   Recent Labs    10/13/21 0812 10/20/21 1016 11/03/21 0859  NA 138 138 138  K 3.8 3.7 4.0  CL 106 105 107  CO2 25 25 24   GLUCOSE 172* 149* 135*  BUN 12 15 12   CREATININE 0.89 0.91 0.90  CALCIUM 8.5* 8.9 9.0  GFRNONAA >60 >60 >60  PROT 6.3* 6.4* 6.7  ALBUMIN 3.6 3.8 3.9  AST 22 26 22   ALT 17 19 12   ALKPHOS 57 87 73  BILITOT 0.2* 0.4 0.4     RADIOGRAPHIC STUDIES: I have personally reviewed the radiological images as listed and agreed with the findings in the report. No results found.   Ovarian cancer on left [facility]) #High-grade serous ovarian cancer-left side-pT1c [stage  Ic-spillage during surgery].  adjuvant chemotherapy with CarboTaxol every 3 weeks x 6 cycles.  Discussed the goal of therapy is cure.    #Proceed with cycle #2 of chemotherapy-carbo Taxol every 3 weeks. Labs today reviewed;  acceptable for treatment today.  Patient is tolerating well. CA125 pending today.   #Mild hypocalcemia -resolved continue calcium 600; add 2000 units of vitamin D; vitamin D level normal.  # urinary frequency - UA and reflex  to culture today   # diarrhea- likely chemo related. Resolved with imodium.    # IV access: PIV.    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  Had visit with genetic counselor 11/02/2021                    HRD testing negative   # DISPOSITION: # chemo today; udenyca- tomorrow.     # follow up in 3 weeks- MD; labs- cbc/cmp; - ca 125;- CARBO-TAXOL; D-2 Ellen Henri- Dr.B  Above plan of care was discussed with patient/family in detail.  My contact information was given to the patient/family.     Jane Canary, MD 11/03/2021 9:35 AM

## 2021-11-03 NOTE — Progress Notes (Signed)
Patient here today for ongoing follow up and treatment consideration regarding ovarian cancer. Patient reports treatment went well overall, she did have some diarrhea that improved with imodium. Patient also reports frequency with urination, no pain.

## 2021-11-03 NOTE — Telephone Encounter (Signed)
PA for ondansatron submitted to insurance via fax thru [place] [employer] (Key: [id number])  [place] [employer] Medicare Coverage Determination Form Prior Authorization for [facility] [employer] Medicare Members. [phone] phone [phone] fax

## 2021-11-04 ENCOUNTER — Inpatient Hospital Stay: Payer: PPO

## 2021-11-04 DIAGNOSIS — C562 Malignant neoplasm of left ovary: Secondary | ICD-10-CM

## 2021-11-04 DIAGNOSIS — Z5111 Encounter for antineoplastic chemotherapy: Secondary | ICD-10-CM | POA: Diagnosis not present

## 2021-11-04 MED ORDER — PEGFILGRASTIM-CBQV 6 MG/0.6ML ~~LOC~~ SOSY
6.0000 mg | PREFILLED_SYRINGE | Freq: Once | SUBCUTANEOUS | Status: AC
  Administered 2021-11-04: 6 mg via SUBCUTANEOUS
  Filled 2021-11-04: qty 0.6

## 2021-11-05 LAB — URINE CULTURE: Culture: 70000 — AB

## 2021-11-05 LAB — CA 125: Cancer Antigen (CA) 125: 14.3 U/mL (ref 0.0–38.1)

## 2021-11-07 ENCOUNTER — Other Ambulatory Visit: Payer: Self-pay | Admitting: Internal Medicine

## 2021-11-07 DIAGNOSIS — N3000 Acute cystitis without hematuria: Secondary | ICD-10-CM

## 2021-11-07 MED ORDER — CEFPODOXIME PROXETIL 100 MG PO TABS: 100.0000 mg | ORAL_TABLET | Freq: Two times a day (BID) | ORAL | 0 refills | Status: AC

## 2021-11-07 NOTE — Progress Notes (Signed)
Called and spoke with the patient  Urine culture growing 70,000 colonies of proteus mirabilis. Considering she has symptom of frequent urination for 1 week and is on chemotherapy will treat with antibiotics.   She is allergic to sulfa - wheezing and itching.  Prefer avoiding FQ in elderly  Will do Cefpodoxime 100 mg BID for 7 days.

## 2021-11-10 ENCOUNTER — Encounter: Payer: Self-pay | Admitting: Internal Medicine

## 2021-11-18 DIAGNOSIS — C562 Malignant neoplasm of left ovary: Secondary | ICD-10-CM | POA: Diagnosis not present

## 2021-11-18 DIAGNOSIS — R413 Other amnesia: Secondary | ICD-10-CM | POA: Diagnosis not present

## 2021-11-23 ENCOUNTER — Ambulatory Visit: Payer: PPO

## 2021-11-23 ENCOUNTER — Ambulatory Visit: Payer: PPO | Admitting: Internal Medicine

## 2021-11-23 ENCOUNTER — Other Ambulatory Visit: Payer: PPO

## 2021-11-23 MED FILL — Fosaprepitant Dimeglumine For IV Infusion 150 MG (Base Eq): INTRAVENOUS | Qty: 5 | Status: AC

## 2021-11-23 MED FILL — Dexamethasone Sodium Phosphate Inj 100 MG/10ML: INTRAMUSCULAR | Qty: 1 | Status: AC

## 2021-11-24 ENCOUNTER — Inpatient Hospital Stay: Payer: PPO | Attending: Obstetrics and Gynecology

## 2021-11-24 ENCOUNTER — Inpatient Hospital Stay: Payer: PPO

## 2021-11-24 ENCOUNTER — Inpatient Hospital Stay (HOSPITAL_BASED_OUTPATIENT_CLINIC_OR_DEPARTMENT_OTHER): Payer: PPO | Admitting: Internal Medicine

## 2021-11-24 ENCOUNTER — Encounter: Payer: Self-pay | Admitting: Internal Medicine

## 2021-11-24 VITALS — BP 124/68 | HR 72

## 2021-11-24 VITALS — BP 122/72 | HR 80 | Temp 97.9°F | Resp 16 | Ht 64.0 in | Wt 174.0 lb

## 2021-11-24 DIAGNOSIS — C562 Malignant neoplasm of left ovary: Secondary | ICD-10-CM | POA: Insufficient documentation

## 2021-11-24 DIAGNOSIS — Z5189 Encounter for other specified aftercare: Secondary | ICD-10-CM | POA: Insufficient documentation

## 2021-11-24 DIAGNOSIS — Z5111 Encounter for antineoplastic chemotherapy: Secondary | ICD-10-CM | POA: Insufficient documentation

## 2021-11-24 LAB — COMPREHENSIVE METABOLIC PANEL
ALT: 16 U/L (ref 0–44)
AST: 26 U/L (ref 15–41)
Albumin: 4 g/dL (ref 3.5–5.0)
Alkaline Phosphatase: 83 U/L (ref 38–126)
Anion gap: 7 (ref 5–15)
BUN: 12 mg/dL (ref 8–23)
CO2: 25 mmol/L (ref 22–32)
Calcium: 9 mg/dL (ref 8.9–10.3)
Chloride: 106 mmol/L (ref 98–111)
Creatinine, Ser: 0.76 mg/dL (ref 0.44–1.00)
GFR, Estimated: >60 mL/min (ref >60)
Glucose, Bld: 181 mg/dL — ABNORMAL HIGH (ref 70–99)
Potassium: 3.9 mmol/L (ref 3.5–5.1)
Sodium: 138 mmol/L (ref 135–145)
Total Bilirubin: 0.5 mg/dL (ref 0.3–1.2)
Total Protein: 6.6 g/dL (ref 6.5–8.1)

## 2021-11-24 LAB — CBC WITH DIFFERENTIAL/PLATELET
Abs Immature Granulocytes: 0.03 10*3/uL (ref 0.00–0.07)
Basophils Absolute: 0.1 10*3/uL (ref 0.0–0.1)
Basophils Relative: 1 %
Eosinophils Absolute: 0.1 10*3/uL (ref 0.0–0.5)
Eosinophils Relative: 1 %
HCT: 37.5 % (ref 36.0–46.0)
Hemoglobin: 13 g/dL (ref 12.0–15.0)
Immature Granulocytes: 0 %
Lymphocytes Relative: 40 %
Lymphs Abs: 3.7 10*3/uL (ref 0.7–4.0)
MCH: 32.5 pg (ref 26.0–34.0)
MCHC: 34.7 g/dL (ref 30.0–36.0)
MCV: 93.8 fL (ref 80.0–100.0)
Monocytes Absolute: 0.4 10*3/uL (ref 0.1–1.0)
Monocytes Relative: 4 %
Neutro Abs: 5 10*3/uL (ref 1.7–7.7)
Neutrophils Relative %: 54 %
Platelets: 191 10*3/uL (ref 150–400)
RBC: 4 MIL/uL (ref 3.87–5.11)
RDW: 16.5 % — ABNORMAL HIGH (ref 11.5–15.5)
WBC: 9.4 10*3/uL (ref 4.0–10.5)
nRBC: 0 % (ref 0.0–0.2)

## 2021-11-24 MED ORDER — SODIUM CHLORIDE 0.9 % IV SOLN
10.0000 mg | Freq: Once | INTRAVENOUS | Status: AC
  Administered 2021-11-24: 10 mg via INTRAVENOUS
  Filled 2021-11-24: qty 10

## 2021-11-24 MED ORDER — FAMOTIDINE IN NACL 20-0.9 MG/50ML-% IV SOLN
20.0000 mg | Freq: Once | INTRAVENOUS | Status: AC
  Administered 2021-11-24: 20 mg via INTRAVENOUS
  Filled 2021-11-24: qty 50

## 2021-11-24 MED ORDER — DIPHENHYDRAMINE HCL 50 MG/ML IJ SOLN
50.0000 mg | Freq: Once | INTRAMUSCULAR | Status: AC
  Administered 2021-11-24: 50 mg via INTRAVENOUS
  Filled 2021-11-24: qty 1

## 2021-11-24 MED ORDER — SODIUM CHLORIDE 0.9 % IV SOLN
425.5000 mg | Freq: Once | INTRAVENOUS | Status: AC
  Administered 2021-11-24: 430 mg via INTRAVENOUS
  Filled 2021-11-24: qty 43

## 2021-11-24 MED ORDER — SODIUM CHLORIDE 0.9 % IV SOLN
150.0000 mg | Freq: Once | INTRAVENOUS | Status: AC
  Administered 2021-11-24: 150 mg via INTRAVENOUS
  Filled 2021-11-24: qty 150

## 2021-11-24 MED ORDER — SODIUM CHLORIDE 0.9 % IV SOLN
175.0000 mg/m2 | Freq: Once | INTRAVENOUS | Status: AC
  Administered 2021-11-24: 330 mg via INTRAVENOUS
  Filled 2021-11-24: qty 55

## 2021-11-24 MED ORDER — PALONOSETRON HCL INJECTION 0.25 MG/5ML
0.2500 mg | Freq: Once | INTRAVENOUS | Status: AC
  Administered 2021-11-24: 0.25 mg via INTRAVENOUS
  Filled 2021-11-24: qty 5

## 2021-11-24 MED ORDER — SODIUM CHLORIDE 0.9 % IV SOLN
Freq: Once | INTRAVENOUS | Status: AC
  Filled 2021-11-24: qty 250

## 2021-11-24 NOTE — Assessment & Plan Note (Addendum)
#  High-grade serous ovarian cancer-left side-pT1c [stage Ic-spillage during surgery].  adjuvant chemotherapy with CarboTaxol every 3 weeks x 6 cycles.  Discussed the goal of therapy is cure.   #Proceed with cycle #3 of chemotherapy-carbo Taxol every 3 weeks. Labs today reviewed;  acceptable for treatment today.   #Recent UTI-Proteus mirabilis s/p treatment with cephalosporin.  Symptoms-improved monitor closely  #Mild hypercalcemia calcium 8.5.  Continue calcium 600; add 2000 units of vitamin D; check vitamin D levels.  # Post BF- 181;  non-diabetic- monitor for now. Discussed MC:NOBSJGG down carbs/ low sugar fruits etc. Hold off processed food.  Check hemoglobin A1c next visit.   # IV access: PIV.   # Genetics: awaiting genetics evaluation today.   # Vaccination: s/p  flu shot; no COVID- RSV shot  # DISPOSITION:  # chemo today; udenyca- tomorrow.     # follow up in 3 weeks- MD; labs- cbc/cmp; - ca 125; Hb A1C-;- CARBO-TAXOL; D-2 Ellen Henri- Dr.B

## 2021-11-24 NOTE — Progress Notes (Signed)
Patient has an increase in fatigue.  Has diarrhea for 2 days after treatment that is controlled with Imodium.

## 2021-11-24 NOTE — Progress Notes (Signed)
[name] NOTE  Patient Care Team: [name], PA-C as PCP - General (Family Medicine) End, Harrell Gave, MD as PCP - Cardiology (Cardiology) Ralene Bathe, MD (Dermatology) Arelia Sneddon, [place] (Optometry) Vladimir Crofts, MD as Consulting Physician (Neurology) Beverly Gust, MD (Otolaryngology) Clent Jacks, RN as Oncology Nurse Navigator  CHIEF COMPLAINTS/PURPOSE OF CONSULTATION: Ovarian cancer.   Oncology History Overview Note  s/p prior hysterectomy (ovaries in situ); Dr. Matilde Sprang for ovarian mass.   incontinence.MRI 08/04/21- Reproductive: Large cystic and solid pelvic mass centered in the pelvis. Ovarian tissue or ovary is not demonstrated on the current study. Mass in total measuring 15 x 11 x 12 cm. Soft tissue component showing restricted diffusion and enhancement centered in the lower portion of the mass shows heterogeneous T2 signal and measures 10.8 x 6.8 x 8.5 cm amidst cystic areas. There is a small amount of associated ascites. The mass abuts the vaginal apex following hysterectomy, potentially involving the LEFT vaginal apex. The "soft tissue component displays very heterogeneous enhancement and there are numerous enhancing septations throughout. Collateral pathways from LEFT ovarian vein are noted about the LEFT lateral aspect of the mass. Given that the gonadal vein can be traced to the mass suspicion is for mass of LEFT ovarian origin though this may of parasitized flow from ovarian. 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 Theora Gianotti; pelvic washings negative]   # AUG 30th, 2023- Carbo-taxol; with GCSF   # PSVT [Dr.Gollan] on metoprolol   Ovarian cancer on left ([place])  09/28/2021 Initial Diagnosis   Ovarian cancer on left ([place])   09/28/2021 Cancer Staging   Staging  form: Ovary, Fallopian Tube, and Primary Peritoneal Carcinoma, AJCC 8th Edition - Clinical: Stage IC (cT1c1, cN0, cM0) - Signed by Cammie Sickle, MD on 09/28/2021   10/13/2021 -  Chemotherapy   Patient is on Treatment Plan : OVARIAN Carboplatin (AUC 6) + Paclitaxel (175) q21d X 6 Cycles      Genetic Testing   Negative genetic testing. No pathogenic variants identified on the [facility] [place] CancerNext-Expanded+RNA panel. The report date is 10/31/2021. HRD testing through [facility] was also negative and reported out 10/31/2021.  The Multi-Cancer Panel + RNA offered by [facility] includes sequencing and/or deletion duplication testing of the following 84 genes: AIP, ALK, APC, ATM, AXIN2,BAP1,  BARD1, BLM, BMPR1A, BRCA1, BRCA2, BRIP1, CASR, CDC73, CDH1, CDK4, CDKN1B, CDKN1C, CDKN2A (p14ARF), CDKN2A (p16INK4a), CEBPA, CHEK2, CTNNA1, DICER1, DIS3L2, EGFR (c.2369C>T, p.Thr790Met variant only), EPCAM (Deletion/duplication testing only), FH, FLCN, GATA2, GPC3, GREM1 (Promoter region deletion/duplication testing only), HOXB13 (c.251G>A, p.Gly84Glu), HRAS, KIT, MAX, MEN1, MET, MITF (c.952G>A, p.Glu318Lys variant only), MLH1, MSH2, MSH3, MSH6, MUTYH, NBN, NF1, NF2, NTHL1, PALB2, PDGFRA, PHOX2B, PMS2, POLD1, POLE, POT1, PRKAR1A, PTCH1, PTEN, RAD50, RAD51C, RAD51D, RB1, RECQL4, RET, RUNX1, SDHAF2, SDHA (sequence changes only), SDHB, SDHC, SDHD, SMAD4, SMARCA4, SMARCB1, SMARCE1, STK11, SUFU, TERC, TERT, TMEM127, TP53, TSC1, TSC2, VHL, WRN and WT1.      HISTORY OF PRESENTING ILLNESS: Ambulating independently.  Alone.   [name] 76 y.o.  female with high-grade serous ovarian cancer stage Ic currently on adjuvant chemotherapy-CarboTaxol is here for follow-up.  Patient is currently status post 2 cycles of CarboTaxol.  In the interim patient was also diagnosed with cystitis treated with antibiotics.  Patient has an increase in fatigue.  Has diarrhea  for 2 days after treatment that is controlled with  Imodium.   Review of Systems  Constitutional:  Negative for chills, diaphoresis, fever, malaise/fatigue and weight loss.  HENT:  Negative for nosebleeds and sore throat.   Eyes:  Negative for double vision.  Respiratory:  Negative for cough, hemoptysis, sputum production, shortness of breath and wheezing.   Cardiovascular:  Negative for chest pain, palpitations, orthopnea and leg swelling.  Gastrointestinal:  Negative for abdominal pain, blood in stool, constipation, diarrhea, heartburn, melena, nausea and vomiting.  Genitourinary:  Negative for dysuria, frequency and urgency.  Musculoskeletal:  Negative for back pain and joint pain.  Skin: Negative.  Negative for itching and rash.  Neurological:  Negative for dizziness, tingling, focal weakness, weakness and headaches.  Endo/Heme/Allergies:  Does not bruise/bleed easily.  Psychiatric/Behavioral:  Negative for depression. The patient is not nervous/anxious and does not have insomnia.     MEDICAL HISTORY:  Past Medical History:  Diagnosis Date   Allergy    Arthritis    Asthma    Atypical chest pain 03/14/2019   Atypical mole 06/12/2018   right prox lat thigh/mod   Atypical mole 05/19/2014   left sup med buttock/excision   Atypical mole 03/26/2014   right sup lat buttock   Atypical mole 03/26/2013   left lat mid back   Atypical mole 11/08/2006   right shoulder superior/excision   Basal cell carcinoma 06/12/2016   right medial sup canthus below eyebrow   Basal cell carcinoma 09/23/2014   left nasal bridge   Basal cell carcinoma 03/26/2013   left nasal sidewall   Basal cell carcinoma 08/27/2012   left nasal dorsum   Cancer (HCC)    shoulder, nose skin CA- basal cell CA   GERD (gastroesophageal reflux disease)    occasional   Hyperlipidemia    Osteopenia    Polyp of transverse colon    Right arm weakness 03/14/2019   Stress due to illness of family member 05/29/2018    SURGICAL HISTORY: Past Surgical History:   Procedure Laterality Date   BREAST BIOPSY Right    core bx- neg   COLONOSCOPY     COLONOSCOPY WITH PROPOFOL N/A 10/08/2018   Procedure: COLONOSCOPY WITH PROPOFOL;  Surgeon: Lucilla Lame, MD;  Location: [facility] ENDOSCOPY;  Service: Endoscopy;  Laterality: N/A;   MOHS SURGERY     TONSILLECTOMY     VAGINAL HYSTERECTOMY      SOCIAL HISTORY: Social History   Socioeconomic History   Marital status: Married    Spouse name: [name]   Number of children: 2   Years of education: Not on file   Highest education level: Bachelor's degree (e.g., BA, AB, BS)  Occupational History   Occupation: Retired  Tobacco Use   Smoking status: Former    Packs/day: 1.50    Years: 20.00    Total pack years: 30.00    Types: Cigarettes    Quit date: 1993    Years since quitting: 30.7    Passive exposure: Past   Smokeless tobacco: Never  Vaping Use   Vaping Use: Never used  Substance and Sexual Activity   Alcohol use: Yes    Alcohol/week: 1.0 standard drink of alcohol    Types: 1 Glasses of wine per week    Comment: one glass of wine once a month   Drug use: Never   Sexual activity: Not Currently  Other Topics Concern   Not on file  Social History Narrative   Lives with husband in  ; quit smoking > 30 years; once every 1-2 months; retd. Teacher- elementary school;    Social Determinants of Health   Financial Resource Strain: Low Risk  (03/03/2021)   Overall Financial Resource Strain (CARDIA)    Difficulty of Paying Living Expenses: Not hard at all  Food Insecurity: No Food Insecurity (03/03/2021)   Hunger Vital Sign    Worried About Running Out of Food in the Last Year: Never true    Ran Out of Food in the Last Year: Never true  Transportation Needs: No Transportation Needs (03/03/2021)   PRAPARE - Hydrologist (Medical): No    Lack of Transportation (Non-Medical): No  Physical Activity: Sufficiently Active (03/03/2021)   Exercise Vital Sign    Days of  Exercise per Week: 7 days    Minutes of Exercise per Session: 30 min  Stress: No Stress Concern Present (03/03/2021)   [place]    Feeling of Stress : Not at all  Social Connections: Moderately Integrated (03/03/2021)   Social Connection and Isolation Panel [NHANES]    Frequency of Communication with Friends and Family: More than three times a week    Frequency of Social Gatherings with Friends and Family: Three times a week    Attends Religious Services: More than 4 times per year    Active Member of Clubs or Organizations: No    Attends Archivist Meetings: Never    Marital Status: Married  Human resources officer Violence: Not At Risk (03/03/2021)   Humiliation, Afraid, Rape, and Kick questionnaire    Fear of Current or Ex-Partner: No    Emotionally Abused: No    Physically Abused: No    Sexually Abused: No    FAMILY HISTORY: Family History  Problem Relation Age of Onset   Lung cancer Mother    Diabetes Father    Parkinson's disease Father    Alzheimer's disease Father    Heart attack Father 39   Alcohol abuse Sister    Heart disease Sister    Lung cancer Sister 34   Down syndrome Sister    Colon cancer Paternal Uncle        d. 72s   Melanoma Maternal Grandmother    Stroke Paternal Grandmother    Lung cancer Paternal Grandmother        d. 74s   Stomach cancer Paternal Grandfather        d. 30s   Liver cancer Son 36   Esophageal cancer Neg Hx    Breast cancer Neg Hx     ALLERGIES:  is allergic to tetanus toxoids and sulfa antibiotics.  MEDICATIONS:  Current Outpatient Medications  Medication Sig Dispense Refill   acetaminophen (TYLENOL) 325 MG tablet Take 650 mg by mouth every 6 (six) hours as needed.     Ascorbic Acid (VITAMIN C) 1000 MG tablet Take 1,000 mg by mouth daily.     calcium carbonate (OS-CAL - DOSED IN MG OF ELEMENTAL CALCIUM) 1250 (500 Ca) MG tablet Take 1 tablet by mouth daily  with breakfast.     escitalopram (LEXAPRO) 20 MG tablet Take 1 tablet (20 mg total) by mouth daily. 90 tablet 3   famotidine (PEPCID) 10 MG tablet Take 1 tablet (10 mg total) by mouth 2 (two) times daily as needed for heartburn or indigestion.     hydrOXYzine (ATARAX/VISTARIL) 25 MG tablet TAKE 1-2 TABLETS (25-50 MG TOTAL) BY MOUTH AT BEDTIME AS NEEDED FOR  ANXIETY (INSOMNIA). 180 tablet 2   lovastatin (MEVACOR) 20 MG tablet TAKE 1 TABLET BY MOUTH EVERYDAY AT BEDTIME 90 tablet 3   melatonin 3 MG TABS tablet Take 3 mg by mouth at bedtime.     vitamin B-12 (CYANOCOBALAMIN) 1000 MCG tablet Take 1,000 mcg by mouth daily.     LORazepam (ATIVAN) 0.5 MG tablet Take 1 tablet (0.5 mg total) by mouth every 6 (six) hours as needed for anxiety. (Patient not taking: Reported on 10/20/2021) 3 tablet 0   metoprolol succinate (TOPROL-XL) 25 MG 24 hr tablet Take by mouth. (Patient not taking: Reported on 10/20/2021)     ondansetron (ZOFRAN) 8 MG tablet One pill every 8 hours as needed for nausea/vomitting. (Patient not taking: Reported on 10/20/2021) 40 tablet 1   prochlorperazine (COMPAZINE) 10 MG tablet Take 1 tablet (10 mg total) by mouth every 6 (six) hours as needed for nausea or vomiting. (Patient not taking: Reported on 10/20/2021) 40 tablet 1   No current facility-administered medications for this visit.    PHYSICAL EXAMINATION: ECOG PERFORMANCE STATUS: 1 - Symptomatic but completely ambulatory  Vitals:   11/24/21 0900  BP: 122/72  Pulse: 80  Resp: 16  Temp: 97.9 F (36.6 C)   Filed Weights   11/24/21 0900  Weight: 174 lb (78.9 kg)    Physical Exam Vitals and nursing note reviewed.  HENT:     Head: Normocephalic and atraumatic.     Mouth/Throat:     Pharynx: Oropharynx is clear.  Eyes:     Extraocular Movements: Extraocular movements intact.     Pupils: Pupils are equal, round, and reactive to light.  Cardiovascular:     Rate and Rhythm: Normal rate and regular rhythm.  Pulmonary:      Comments: Decreased breath sounds bilaterally.  Abdominal:     Palpations: Abdomen is soft.  Musculoskeletal:        General: Normal range of motion.     Cervical back: Normal range of motion.  Skin:    General: Skin is warm.  Neurological:     General: No focal deficit present.     Mental Status: She is alert and oriented to person, place, and time.  Psychiatric:        Behavior: Behavior normal.        Judgment: Judgment normal.     LABORATORY DATA:  I have reviewed the data as listed Lab Results  Component Value Date   WBC 9.4 11/24/2021   HGB 13.0 11/24/2021   HCT 37.5 11/24/2021   MCV 93.8 11/24/2021   PLT 191 11/24/2021   Recent Labs    10/20/21 1016 11/03/21 0859 11/24/21 0852  NA 138 138 138  K 3.7 4.0 3.9  CL 105 107 106  CO2 25 24 25   GLUCOSE 149* 135* 181*  BUN 15 12 12   CREATININE 0.91 0.90 0.76  CALCIUM 8.9 9.0 9.0  GFRNONAA >60 >60 >60  PROT 6.4* 6.7 6.6  ALBUMIN 3.8 3.9 4.0  AST 26 22 26   ALT 19 12 16   ALKPHOS 87 73 83  BILITOT 0.4 0.4 0.5    RADIOGRAPHIC STUDIES: I have personally reviewed the radiological images as listed and agreed with the findings in the report. No results found.   Ovarian cancer on left  [facility]) #High-grade serous ovarian cancer-left side-pT1c [stage Ic-spillage during surgery].  adjuvant chemotherapy with CarboTaxol every 3 weeks x 6 cycles.  Discussed the goal of therapy is cure.   #Proceed with cycle #3 of  chemotherapy-carbo Taxol every 3 weeks. Labs today reviewed;  acceptable for treatment today.   #Recent UTI-Proteus mirabilis s/p treatment with cephalosporin.  Symptoms-improved monitor closely  #Mild hypercalcemia calcium 8.5.  Continue calcium 600; add 2000 units of vitamin D; check vitamin D levels.  # Post BF- 181;  non-diabetic- monitor for now. Discussed LK:ZGFUQXA down carbs/ low sugar fruits etc. Hold off processed food.  Check hemoglobin A1c next visit.   # IV access: PIV.   # Genetics: awaiting  genetics evaluation today.   # Vaccination: s/p  flu shot; no COVID- RSV shot  # DISPOSITION:  # chemo today; udenyca- tomorrow.     # follow up in 3 weeks- MD; labs- cbc/cmp; - ca 125; Hb A1C-;- CARBO-TAXOL; D-2 Ellen Henri- Dr.B     # PAIN CONTROL  # GENETICS:  # CLINICAL TRIALS:  # DISPOSITION: # labs today- ordered # Follow up-   Thank you Dr. for allowing me to participate in the care of your pleasant patient. Please do not hesitate to contact me with questions or concerns in the interim.  Above plan of care was discussed with patient/family in detail.  My contact information was given to the patient/family.     Cammie Sickle, MD 11/24/2021 10:04 AM

## 2021-11-24 NOTE — Patient Instructions (Signed)
[facility]  Discharge Instructions: Thank you for choosing [facility] to provide your oncology and hematology care.  If you have a lab appointment with the Cancer Center, please go directly to the Cancer Center and check in at the registration area.  Wear comfortable clothing and clothing appropriate for easy access to any Portacath or PICC line.   We strive to give you quality time with your provider. You may need to reschedule your appointment if you arrive late (15 or more minutes).  Arriving late affects you and other patients whose appointments are after yours.  Also, if you miss three or more appointments without notifying the office, you may be dismissed from the clinic at the provider's discretion.      For prescription refill requests, have your pharmacy contact our office and allow 72 hours for refills to be completed.       To help prevent nausea and vomiting after your treatment, we encourage you to take your nausea medication as directed.  BELOW ARE SYMPTOMS THAT SHOULD BE REPORTED IMMEDIATELY: *FEVER GREATER THAN 100.4 F (38 C) OR HIGHER *CHILLS OR SWEATING *NAUSEA AND VOMITING THAT IS NOT CONTROLLED WITH YOUR NAUSEA MEDICATION *UNUSUAL SHORTNESS OF BREATH *UNUSUAL BRUISING OR BLEEDING *URINARY PROBLEMS (pain or burning when urinating, or frequent urination) *BOWEL PROBLEMS (unusual diarrhea, constipation, pain near the anus) TENDERNESS IN MOUTH AND THROAT WITH OR WITHOUT PRESENCE OF ULCERS (sore throat, sores in mouth, or a toothache) UNUSUAL RASH, SWELLING OR PAIN  UNUSUAL VAGINAL DISCHARGE OR ITCHING   Items with * indicate a potential emergency and should be followed up as soon as possible or go to the Emergency Department if any problems should occur.  Please show the CHEMOTHERAPY ALERT CARD or IMMUNOTHERAPY ALERT CARD at check-in to the Emergency Department and triage nurse.  Should you have questions after your  visit or need to cancel or reschedule your appointment, please contact [facility]  [phone] and follow the prompts.  Office hours are 8:00 a.m. to 4:30 p.m. Monday - Friday. Please note that voicemails left after 4:00 p.m. may not be returned until the following business day.  We are closed weekends and major holidays. You have access to a nurse at all times for urgent questions. Please call the main number to the clinic [phone] and follow the prompts.  For any non-urgent questions, you may also contact your provider using MyChart. We now offer e-Visits for anyone 18 and older to request care online for non-urgent symptoms. For details visit [email or web address].   Also download the MyChart app! Go to the app store, search "MyChart", open the app, select [facility], and log in with your MyChart username and password.  Masks are optional in the cancer centers. If you would like for your care team to wear a mask while they are taking care of you, please let them know. For doctor visits, patients may have with them one support person who is at least 76 years old. At this time, visitors are not allowed in the infusion area.   

## 2021-11-25 ENCOUNTER — Inpatient Hospital Stay: Payer: PPO

## 2021-11-25 VITALS — Temp 97.1°F

## 2021-11-25 DIAGNOSIS — Z5111 Encounter for antineoplastic chemotherapy: Secondary | ICD-10-CM | POA: Diagnosis not present

## 2021-11-25 DIAGNOSIS — C562 Malignant neoplasm of left ovary: Secondary | ICD-10-CM

## 2021-11-25 MED ORDER — PEGFILGRASTIM-CBQV 6 MG/0.6ML ~~LOC~~ SOSY
6.0000 mg | PREFILLED_SYRINGE | Freq: Once | SUBCUTANEOUS | Status: AC
  Administered 2021-11-25: 6 mg via SUBCUTANEOUS

## 2021-12-05 ENCOUNTER — Ambulatory Visit: Payer: PPO | Admitting: Dermatology

## 2021-12-05 DIAGNOSIS — L820 Inflamed seborrheic keratosis: Secondary | ICD-10-CM | POA: Diagnosis not present

## 2021-12-05 DIAGNOSIS — L578 Other skin changes due to chronic exposure to nonionizing radiation: Secondary | ICD-10-CM

## 2021-12-05 DIAGNOSIS — Z1283 Encounter for screening for malignant neoplasm of skin: Secondary | ICD-10-CM | POA: Diagnosis not present

## 2021-12-05 DIAGNOSIS — Z86018 Personal history of other benign neoplasm: Secondary | ICD-10-CM

## 2021-12-05 DIAGNOSIS — Z85828 Personal history of other malignant neoplasm of skin: Secondary | ICD-10-CM

## 2021-12-05 DIAGNOSIS — D229 Melanocytic nevi, unspecified: Secondary | ICD-10-CM

## 2021-12-05 DIAGNOSIS — Z8589 Personal history of malignant neoplasm of other organs and systems: Secondary | ICD-10-CM

## 2021-12-05 DIAGNOSIS — L853 Xerosis cutis: Secondary | ICD-10-CM

## 2021-12-05 DIAGNOSIS — L821 Other seborrheic keratosis: Secondary | ICD-10-CM

## 2021-12-05 DIAGNOSIS — L814 Other melanin hyperpigmentation: Secondary | ICD-10-CM

## 2021-12-05 NOTE — Progress Notes (Signed)
Follow-Up Visit   Subjective  [name] is a 76 y.o. female who presents for the following: Annual Exam. Hx of BCC, hx of Dysplastic nevus.  The patient presents for Total-Body Skin Exam (TBSE) for skin cancer screening and mole check.  The patient has spots, moles and lesions to be evaluated, some may be new or changing and the patient has concerns that these could be cancer.   Ovarian cancer patient.  The following portions of the chart were reviewed this encounter and updated as appropriate:   Tobacco  Allergies  Meds  Problems  Med Hx  Surg Hx  Fam Hx     Review of Systems:  No other skin or systemic complaints except as noted in HPI or Assessment and Plan.  Objective  Well appearing patient in no apparent distress; mood and affect are within normal limits.  A full examination was performed including scalp, head, eyes, ears, nose, lips, neck, chest, axillae, abdomen, back, buttocks, bilateral upper extremities, bilateral lower extremities, hands, feet, fingers, toes, fingernails, and toenails. All findings within normal limits unless otherwise noted below.  neck x 2 (2) Stuck-on, waxy, tan-brown papules  -- Discussed benign etiology and prognosis.    Assessment & Plan  Inflamed seborrheic keratosis (2) neck x 2  Symptomatic, irritating, patient would like treated.   Destruction of lesion - neck x 2 Complexity: simple   Destruction method: cryotherapy   Informed consent: discussed and consent obtained   Timeout:  patient name, date of birth, surgical site, and procedure verified Lesion destroyed using liquid nitrogen: Yes   Region frozen until ice ball extended beyond lesion: Yes   Outcome: patient tolerated procedure well with no complications   Post-procedure details: wound care instructions given    Lentigines - Scattered tan macules - Due to sun exposure - Benign-appearing, observe - Recommend daily broad spectrum sunscreen SPF 30+ to sun-exposed  areas, reapply every 2 hours as needed. - Call for any changes  Seborrheic Keratoses - Stuck-on, waxy, tan-brown papules and/or plaques  - Benign-appearing - Discussed benign etiology and prognosis. - Observe - Call for any changes  Melanocytic Nevi - Tan-brown and/or pink-flesh-colored symmetric macules and papules - Benign appearing on exam today - Observation - Call clinic for new or changing moles - Recommend daily use of broad spectrum spf 30+ sunscreen to sun-exposed areas.   Hemangiomas - Red papules - Discussed benign nature - Observe - Call for any changes  Actinic Damage - Chronic condition, secondary to cumulative UV/sun exposure - diffuse scaly erythematous macules with underlying dyspigmentation - Recommend daily broad spectrum sunscreen SPF 30+ to sun-exposed areas, reapply every 2 hours as needed.  - Staying in the shade or wearing long sleeves, sun glasses (UVA+UVB protection) and wide brim hats (4-inch brim around the entire circumference of the hat) are also recommended for sun protection.  - Call for new or changing lesions.  Raeanne Gathers to chemo  Start otc Cerave cream or Amlactin rapid relief cream - diffuse xerotic patches - recommend gentle, hydrating skin care - gentle skin care handout given   History of Dysplastic Nevi Multiple see history  - No evidence of recurrence today - Recommend regular full body skin exams - Recommend daily broad spectrum sunscreen SPF 30+ to sun-exposed areas, reapply every 2 hours as needed.  - Call if any new or changing lesions are noted between office visits   History of Basal Cell Carcinoma of the Skin Multiple see history  - No  evidence of recurrence today - Recommend regular full body skin exams - Recommend daily broad spectrum sunscreen SPF 30+ to sun-exposed areas, reapply every 2 hours as needed.  - Call if any new or changing lesions are noted between office visits   Skin cancer screening performed  today.   Return in about 1 year (around 12/06/2022) for TBSE, hx of BCC, hx of Dysplastic nevus .  IMarye Round, CMA, am acting as scribe for Sarina Ser, MD .  Documentation: I have reviewed the above documentation for accuracy and completeness, and I agree with the above.  Sarina Ser, MD

## 2021-12-05 NOTE — Patient Instructions (Addendum)
Raeanne Gathers to chemo  Start otc Cerave cream or Amlactin rapid relief cream    Cryotherapy Aftercare  Wash gently with soap and water everyday.   Apply Vaseline and Band-Aid daily until healed.      Due to recent changes in healthcare laws, you may see results of your pathology and/or laboratory studies on MyChart before the doctors have had a chance to review them. We understand that in some cases there may be results that are confusing or concerning to you. Please understand that not all results are received at the same time and often the doctors may need to interpret multiple results in order to provide you with the best plan of care or course of treatment. Therefore, we ask that you please give [place] 2 business days to thoroughly review all your results before contacting the office for clarification. Should we see a critical lab result, you will be contacted sooner.   If You Need Anything After Your Visit  If you have any questions or concerns for your doctor, please call our main line at [phone] and press option 4 to reach your doctor's medical assistant. If no one answers, please leave a voicemail as directed and we will return your call as soon as possible. Messages left after 4 pm will be answered the following business day.   You may also send [place] a message via [place]. We typically respond to MyChart messages within 1-2 business days.  For prescription refills, please ask your pharmacy to contact our office. Our fax number is [phone].  If you have an urgent issue when the clinic is closed that cannot wait until the next business day, you can page your doctor at the number below.    Please note that while we do our best to be available for urgent issues outside of office hours, we are not available 24/7.   If you have an urgent issue and are unable to reach [place], you may choose to seek medical care at your doctor's office, retail clinic, urgent care center, or emergency  room.  If you have a medical emergency, please immediately call 911 or go to the emergency department.  Pager Numbers  - Dr. Nehemiah Massed: [phone]  - Dr. Laurence Ferrari: [phone]  - Dr. Nicole Kindred: [phone]  In the event of inclement weather, please call our main line at [phone] for an update on the status of any delays or closures.  Dermatology Medication Tips: Please keep the boxes that topical medications come in in order to help keep track of the instructions about where and how to use these. Pharmacies typically print the medication instructions only on the boxes and not directly on the medication tubes.   If your medication is too expensive, please contact our office at [phone] option 4 or send [place] a message through [place].   We are unable to tell what your co-pay for medications will be in advance as this is different depending on your insurance coverage. However, we may be able to find a substitute medication at lower cost or fill out paperwork to get insurance to cover a needed medication.   If a prior authorization is required to get your medication covered by your insurance company, please allow [place] 1-2 business days to complete this process.  Drug prices often vary depending on where the prescription is filled and some pharmacies may offer cheaper prices.  The website www.goodrx.com contains coupons for medications through different pharmacies. The prices here do not account for what the cost  may be with help from insurance (it may be cheaper with your insurance), but the website can give you the price if you did not use any insurance.  - You can print the associated coupon and take it with your prescription to the pharmacy.  - You may also stop by our office during regular business hours and pick up a GoodRx coupon card.  - If you need your prescription sent electronically to a different pharmacy, notify our office through [facility] or by phone at [phone]  option 4.     Si Usted Necesita Algo Despus de Su Visita  Tambin puede enviarnos un mensaje a travs de Pharmacist, community. Por lo general respondemos a los mensajes de MyChart en el transcurso de 1 a 2 das hbiles.  Para renovar recetas, por favor pida a su farmacia que se ponga en contacto con nuestra oficina. Harland Dingwall de fax es [place] [phone].  Si tiene un asunto urgente cuando la clnica est cerrada y que no puede esperar hasta el siguiente da hbil, puede llamar/localizar a su doctor(a) al nmero que aparece a continuacin.   Por favor, tenga en cuenta que aunque hacemos todo lo posible para estar disponibles para asuntos urgentes fuera del horario de [place], no estamos disponibles las 24 horas del da, los 7 das de la [place].   Si tiene un problema urgente y no puede comunicarse con nosotros, puede optar por buscar atencin mdica  en el consultorio de su doctor(a), en una clnica privada, en un centro de atencin urgente o en una sala de emergencias.  Si tiene Engineering geologist, por favor llame inmediatamente al 911 o vaya a la sala de emergencias.  Nmeros de bper  - Dr. Nehemiah Massed: [phone]  - Dra. Moye: [phone]  - Dra. Nicole Kindred: [phone]  En caso de inclemencias del [place], por favor llame a Johnsie Kindred principal al [phone] para una actualizacin sobre el [place] de cualquier retraso o cierre.  Consejos para la medicacin en dermatologa: Por favor, guarde las cajas en las que vienen los medicamentos de uso tpico para ayudarle a seguir las instrucciones sobre dnde y cmo usarlos. Las farmacias generalmente imprimen las instrucciones del medicamento slo en las cajas y no directamente en los tubos del [place].   Si su medicamento es muy caro, por favor, pngase en contacto con Zigmund Daniel llamando al [phone] y presione la opcin 4 o envenos un mensaje a travs de Pharmacist, community.   No podemos decirle cul ser su copago por los medicamentos  por adelantado ya que esto es diferente dependiendo de la cobertura de su seguro. Sin embargo, es posible que podamos encontrar un medicamento sustituto a Electrical engineer un formulario para que el seguro cubra el medicamento que se considera necesario.   Si se requiere una autorizacin previa para que su compaa de seguros [place] su medicamento, por favor permtanos de 1 a 2 das hbiles para completar este proceso.  Los precios de los medicamentos varan con frecuencia dependiendo del Environmental consultant de dnde se surte la receta y alguna farmacias pueden ofrecer precios ms baratos.  El sitio web www.goodrx.com tiene cupones para medicamentos de Airline pilot. Los precios aqu no tienen en cuenta lo que podra costar con la ayuda del seguro (puede ser ms barato con su seguro), pero el sitio web puede darle el precio si no utiliz Research scientist (physical sciences).  - Puede imprimir el cupn correspondiente y llevarlo con su receta a la farmacia.  - Tambin puede pasar por Cleotis Nipper  oficina durante el horario de atencin regular y Charity fundraiser una tarjeta de cupones de GoodRx.  - Si necesita que su receta se enve electrnicamente a una farmacia diferente, informe a nuestra oficina a travs de MyChart de [place] o por telfono llamando al [phone] y presione la opcin 4.

## 2021-12-06 ENCOUNTER — Other Ambulatory Visit: Payer: Self-pay

## 2021-12-11 ENCOUNTER — Encounter: Payer: Self-pay | Admitting: Dermatology

## 2021-12-14 MED FILL — Dexamethasone Sodium Phosphate Inj 100 MG/10ML: INTRAMUSCULAR | Qty: 1 | Status: AC

## 2021-12-14 MED FILL — Fosaprepitant Dimeglumine For IV Infusion 150 MG (Base Eq): INTRAVENOUS | Qty: 5 | Status: AC

## 2021-12-15 ENCOUNTER — Inpatient Hospital Stay: Payer: PPO | Admitting: Internal Medicine

## 2021-12-15 ENCOUNTER — Inpatient Hospital Stay: Payer: PPO | Attending: Obstetrics and Gynecology

## 2021-12-15 ENCOUNTER — Encounter: Payer: Self-pay | Admitting: Internal Medicine

## 2021-12-15 ENCOUNTER — Inpatient Hospital Stay: Payer: PPO

## 2021-12-15 VITALS — BP 126/67 | HR 75 | Temp 96.6°F | Resp 20 | Wt 173.3 lb

## 2021-12-15 DIAGNOSIS — Z801 Family history of malignant neoplasm of trachea, bronchus and lung: Secondary | ICD-10-CM | POA: Diagnosis not present

## 2021-12-15 DIAGNOSIS — Z79899 Other long term (current) drug therapy: Secondary | ICD-10-CM | POA: Insufficient documentation

## 2021-12-15 DIAGNOSIS — Z87891 Personal history of nicotine dependence: Secondary | ICD-10-CM | POA: Insufficient documentation

## 2021-12-15 DIAGNOSIS — Z5189 Encounter for other specified aftercare: Secondary | ICD-10-CM | POA: Diagnosis not present

## 2021-12-15 DIAGNOSIS — C562 Malignant neoplasm of left ovary: Secondary | ICD-10-CM | POA: Insufficient documentation

## 2021-12-15 DIAGNOSIS — Z5111 Encounter for antineoplastic chemotherapy: Secondary | ICD-10-CM | POA: Diagnosis not present

## 2021-12-15 DIAGNOSIS — Z85828 Personal history of other malignant neoplasm of skin: Secondary | ICD-10-CM | POA: Diagnosis not present

## 2021-12-15 DIAGNOSIS — Z800 Family history of malignant neoplasm of digestive organs: Secondary | ICD-10-CM | POA: Insufficient documentation

## 2021-12-15 DIAGNOSIS — Z808 Family history of malignant neoplasm of other organs or systems: Secondary | ICD-10-CM | POA: Diagnosis not present

## 2021-12-15 LAB — CBC WITH DIFFERENTIAL/PLATELET
Abs Immature Granulocytes: 0.03 10*3/uL (ref 0.00–0.07)
Basophils Absolute: 0.1 10*3/uL (ref 0.0–0.1)
Basophils Relative: 1 %
Eosinophils Absolute: 0.2 10*3/uL (ref 0.0–0.5)
Eosinophils Relative: 2 %
HCT: 37.4 % (ref 36.0–46.0)
Hemoglobin: 12.4 g/dL (ref 12.0–15.0)
Immature Granulocytes: 0 %
Lymphocytes Relative: 43 %
Lymphs Abs: 4.2 10*3/uL — ABNORMAL HIGH (ref 0.7–4.0)
MCH: 32.8 pg (ref 26.0–34.0)
MCHC: 33.2 g/dL (ref 30.0–36.0)
MCV: 98.9 fL (ref 80.0–100.0)
Monocytes Absolute: 0.4 10*3/uL (ref 0.1–1.0)
Monocytes Relative: 4 %
Neutro Abs: 4.8 10*3/uL (ref 1.7–7.7)
Neutrophils Relative %: 50 %
Platelets: 182 10*3/uL (ref 150–400)
RBC: 3.78 MIL/uL — ABNORMAL LOW (ref 3.87–5.11)
RDW: 17.6 % — ABNORMAL HIGH (ref 11.5–15.5)
WBC: 9.7 10*3/uL (ref 4.0–10.5)
nRBC: 0 % (ref 0.0–0.2)

## 2021-12-15 LAB — COMPREHENSIVE METABOLIC PANEL
ALT: 16 U/L (ref 0–44)
AST: 18 U/L (ref 15–41)
Albumin: 4.1 g/dL (ref 3.5–5.0)
Alkaline Phosphatase: 88 U/L (ref 38–126)
Anion gap: 7 (ref 5–15)
BUN: 13 mg/dL (ref 8–23)
CO2: 27 mmol/L (ref 22–32)
Calcium: 8.9 mg/dL (ref 8.9–10.3)
Chloride: 105 mmol/L (ref 98–111)
Creatinine, Ser: 0.85 mg/dL (ref 0.44–1.00)
GFR, Estimated: >60 mL/min (ref >60)
Glucose, Bld: 146 mg/dL — ABNORMAL HIGH (ref 70–99)
Potassium: 4.1 mmol/L (ref 3.5–5.1)
Sodium: 139 mmol/L (ref 135–145)
Total Bilirubin: 0.3 mg/dL (ref 0.3–1.2)
Total Protein: 6.7 g/dL (ref 6.5–8.1)

## 2021-12-15 MED ORDER — SODIUM CHLORIDE 0.9 % IV SOLN
10.0000 mg | Freq: Once | INTRAVENOUS | Status: AC
  Administered 2021-12-15: 10 mg via INTRAVENOUS
  Filled 2021-12-15: qty 10

## 2021-12-15 MED ORDER — SODIUM CHLORIDE 0.9 % IV SOLN
Freq: Once | INTRAVENOUS | Status: AC
  Filled 2021-12-15: qty 250

## 2021-12-15 MED ORDER — SODIUM CHLORIDE 0.9 % IV SOLN
175.0000 mg/m2 | Freq: Once | INTRAVENOUS | Status: AC
  Administered 2021-12-15: 330 mg via INTRAVENOUS
  Filled 2021-12-15: qty 55

## 2021-12-15 MED ORDER — SODIUM CHLORIDE 0.9 % IV SOLN
150.0000 mg | Freq: Once | INTRAVENOUS | Status: AC
  Administered 2021-12-15: 150 mg via INTRAVENOUS
  Filled 2021-12-15: qty 150

## 2021-12-15 MED ORDER — FAMOTIDINE IN NACL 20-0.9 MG/50ML-% IV SOLN
20.0000 mg | Freq: Once | INTRAVENOUS | Status: AC
  Administered 2021-12-15: 20 mg via INTRAVENOUS
  Filled 2021-12-15: qty 50

## 2021-12-15 MED ORDER — DIPHENHYDRAMINE HCL 50 MG/ML IJ SOLN
50.0000 mg | Freq: Once | INTRAMUSCULAR | Status: AC
  Administered 2021-12-15: 50 mg via INTRAVENOUS
  Filled 2021-12-15: qty 1

## 2021-12-15 MED ORDER — SODIUM CHLORIDE 0.9 % IV SOLN
425.5000 mg | Freq: Once | INTRAVENOUS | Status: AC
  Administered 2021-12-15: 430 mg via INTRAVENOUS
  Filled 2021-12-15: qty 43

## 2021-12-15 MED ORDER — PALONOSETRON HCL INJECTION 0.25 MG/5ML
0.2500 mg | Freq: Once | INTRAVENOUS | Status: AC
  Administered 2021-12-15: 0.25 mg via INTRAVENOUS
  Filled 2021-12-15: qty 5

## 2021-12-15 NOTE — Progress Notes (Signed)
[place] NOTE  Patient Care Team: [name], PA-C as PCP - General (Family Medicine) End, Harrell Gave, MD as PCP - Cardiology (Cardiology) Ralene Bathe, MD (Dermatology) Arelia Sneddon, [place] (Optometry) Vladimir Crofts, MD as Consulting Physician (Neurology) Beverly Gust, MD (Otolaryngology) Clent Jacks, RN as Oncology Nurse Navigator  CHIEF COMPLAINTS/PURPOSE OF CONSULTATION: Ovarian cancer.   Oncology History Overview Note  s/p prior hysterectomy (ovaries in situ); Dr. Matilde Sprang for ovarian mass.   incontinence.MRI 08/04/21- Reproductive: Large cystic and solid pelvic mass centered in the pelvis. Ovarian tissue or ovary is not demonstrated on the current study. Mass in total measuring 15 x 11 x 12 cm. Soft tissue component showing restricted diffusion and enhancement centered in the lower portion of the mass shows heterogeneous T2 signal and measures 10.8 x 6.8 x 8.5 cm amidst cystic areas. There is a small amount of associated ascites. The mass abuts the vaginal apex following hysterectomy, potentially involving the LEFT vaginal apex. The "soft tissue component displays very heterogeneous enhancement and there are numerous enhancing septations throughout. Collateral pathways from LEFT ovarian vein are noted about the LEFT lateral aspect of the mass. Given that the gonadal vein can be traced to the mass suspicion is for mass of LEFT ovarian origin though this may of parasitized flow from ovarian. 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 Theora Gianotti; pelvic washings negative]   # AUG 30th, 2023- Carbo-taxol; with GCSF   # PSVT [Dr.Gollan] on metoprolol   Ovarian cancer on left ([place])  09/28/2021 Initial Diagnosis   Ovarian cancer on left ([place])   09/28/2021 Cancer Staging   Staging  form: Ovary, Fallopian Tube, and Primary Peritoneal Carcinoma, AJCC 8th Edition - Clinical: Stage IC (cT1c1, cN0, cM0) - Signed by Cammie Sickle, MD on 09/28/2021   10/13/2021 -  Chemotherapy   Patient is on Treatment Plan : OVARIAN Carboplatin (AUC 6) + Paclitaxel (175) q21d X 6 Cycles      Genetic Testing   Negative genetic testing. No pathogenic variants identified on the [facility] CancerNext-Expanded+RNA panel. The report date is 10/31/2021. HRD testing through [facility] was also negative and reported out 10/31/2021.  The Multi-Cancer Panel + RNA offered by [facility] includes sequencing and/or deletion duplication testing of the following 84 genes: AIP, ALK, APC, ATM, AXIN2,BAP1,  BARD1, BLM, BMPR1A, BRCA1, BRCA2, BRIP1, CASR, CDC73, CDH1, CDK4, CDKN1B, CDKN1C, CDKN2A (p14ARF), CDKN2A (p16INK4a), CEBPA, CHEK2, CTNNA1, DICER1, DIS3L2, EGFR (c.2369C>T, p.Thr790Met variant only), EPCAM (Deletion/duplication testing only), FH, FLCN, GATA2, GPC3, GREM1 (Promoter region deletion/duplication testing only), HOXB13 (c.251G>A, p.Gly84Glu), HRAS, KIT, MAX, MEN1, MET, MITF (c.952G>A, p.Glu318Lys variant only), MLH1, MSH2, MSH3, MSH6, MUTYH, NBN, NF1, NF2, NTHL1, PALB2, PDGFRA, PHOX2B, PMS2, POLD1, POLE, POT1, PRKAR1A, PTCH1, PTEN, RAD50, RAD51C, RAD51D, RB1, RECQL4, RET, RUNX1, SDHAF2, SDHA (sequence changes only), SDHB, SDHC, SDHD, SMAD4, SMARCA4, SMARCB1, SMARCE1, STK11, SUFU, TERC, TERT, TMEM127, TP53, TSC1, TSC2, VHL, WRN and WT1.      HISTORY OF PRESENTING ILLNESS: Ambulating independently.  Alone.   [name] 76 y.o.  female with high-grade serous ovarian cancer stage Ic currently on adjuvant chemotherapy-CarboTaxol is here for follow-up.  Patient planned to go to [place] for a month.   Patient is currently status post 3  cycles of CarboTaxol.  Mild tingling and numbness; not in significantly worse.  Patient  has an increase in fatigue.  Denies any diarrhea or constipation.  Review of  Systems  Constitutional:  Positive for malaise/fatigue. Negative for chills, diaphoresis, fever and weight loss.  HENT:  Negative for nosebleeds and sore throat.   Eyes:  Negative for double vision.  Respiratory:  Negative for cough, hemoptysis, sputum production, shortness of breath and wheezing.   Cardiovascular:  Negative for chest pain, palpitations, orthopnea and leg swelling.  Gastrointestinal:  Negative for abdominal pain, blood in stool, constipation, diarrhea, heartburn, melena, nausea and vomiting.  Genitourinary:  Negative for dysuria, frequency and urgency.  Musculoskeletal:  Negative for back pain and joint pain.  Skin: Negative.  Negative for itching and rash.  Neurological:  Negative for dizziness, tingling, focal weakness, weakness and headaches.  Endo/Heme/Allergies:  Does not bruise/bleed easily.  Psychiatric/Behavioral:  Negative for depression. The patient is not nervous/anxious and does not have insomnia.     MEDICAL HISTORY:  Past Medical History:  Diagnosis Date   Allergy    Arthritis    Asthma    Atypical chest pain 03/14/2019   Atypical mole 06/12/2018   right prox lat thigh/mod   Atypical mole 05/19/2014   left sup med buttock/excision   Atypical mole 03/26/2014   right sup lat buttock   Atypical mole 03/26/2013   left lat mid back   Atypical mole 11/08/2006   right shoulder superior/excision   Basal cell carcinoma 06/12/2016   right medial sup canthus below eyebrow   Basal cell carcinoma 09/23/2014   left nasal bridge   Basal cell carcinoma 03/26/2013   left nasal sidewall   Basal cell carcinoma 08/27/2012   left nasal dorsum   Cancer (HCC)    shoulder, nose skin CA- basal cell CA   GERD (gastroesophageal reflux disease)    occasional   Hyperlipidemia    Osteopenia    Polyp of transverse colon    Right arm weakness 03/14/2019   Stress due to illness of family member 05/29/2018    SURGICAL HISTORY: Past Surgical History:  Procedure  Laterality Date   BREAST BIOPSY Right    core bx- neg   COLONOSCOPY     COLONOSCOPY WITH PROPOFOL N/A 10/08/2018   Procedure: COLONOSCOPY WITH PROPOFOL;  Surgeon: Lucilla Lame, MD;  Location[facility] ENDOSCOPY;  Service: Endoscopy;  Laterality: N/A;   MOHS SURGERY     TONSILLECTOMY     VAGINAL HYSTERECTOMY      SOCIAL HISTORY: Social History   Socioeconomic History   Marital status: Married    Spouse name: [name]   Number of children: 2   Years of education: Not on file   Highest education level: Bachelor's degree (e.g., BA, AB, BS)  Occupational History   Occupation: Retired  Tobacco Use   Smoking status: Former    Packs/day: 1.50    Years: 20.00    Total pack years: 30.00    Types: Cigarettes    Quit date: 1993    Years since quitting: 30.8    Passive exposure: Past   Smokeless tobacco: Never  Vaping Use   Vaping Use: Never used  Substance and Sexual Activity   Alcohol use: Yes    Alcohol/week: 1.0 standard drink of alcohol    Types: 1 Glasses of wine per week    Comment: one glass of wine once a month   Drug use: Never   Sexual activity: Not Currently  Other Topics Concern   Not on file  Social History Narrative   Lives with  husband in [place]; quit smoking > 30 years; once every 1-2 months; retd. Teacher- elementary school;    Social Determinants of Health   Financial Resource Strain: Low Risk  (03/03/2021)   Overall Financial Resource Strain (CARDIA)    Difficulty of Paying Living Expenses: Not hard at all  Food Insecurity: No Food Insecurity (03/03/2021)   Hunger Vital Sign    Worried About Running Out of Food in the Last Year: Never true    Ran Out of Food in the Last Year: Never true  Transportation Needs: No Transportation Needs (03/03/2021)   PRAPARE - Hydrologist (Medical): No    Lack of Transportation (Non-Medical): No  Physical Activity: Sufficiently Active (03/03/2021)   Exercise Vital Sign    Days of Exercise per Week:  7 days    Minutes of Exercise per Session: 30 min  Stress: No Stress Concern Present (03/03/2021)   [place]    Feeling of Stress : Not at all  Social Connections: Moderately Integrated (03/03/2021)   Social Connection and Isolation Panel [NHANES]    Frequency of Communication with Friends and Family: More than three times a week    Frequency of Social Gatherings with Friends and Family: Three times a week    Attends Religious Services: More than 4 times per year    Active Member of Clubs or Organizations: No    Attends Archivist Meetings: Never    Marital Status: Married  Human resources officer Violence: Not At Risk (03/03/2021)   Humiliation, Afraid, Rape, and Kick questionnaire    Fear of Current or Ex-Partner: No    Emotionally Abused: No    Physically Abused: No    Sexually Abused: No    FAMILY HISTORY: Family History  Problem Relation Age of Onset   Lung cancer Mother    Diabetes Father    Parkinson's disease Father    Alzheimer's disease Father    Heart attack Father 36   Alcohol abuse Sister    Heart disease Sister    Lung cancer Sister 89   Down syndrome Sister    Colon cancer Paternal Uncle        d. 3s   Melanoma Maternal Grandmother    Stroke Paternal Grandmother    Lung cancer Paternal Grandmother        d. 24s   Stomach cancer Paternal Grandfather        d. 63s   Liver cancer Son 83   Esophageal cancer Neg Hx    Breast cancer Neg Hx     ALLERGIES:  is allergic to tetanus toxoids and sulfa antibiotics.  MEDICATIONS:  Current Outpatient Medications  Medication Sig Dispense Refill   acetaminophen (TYLENOL) 325 MG tablet Take 650 mg by mouth every 6 (six) hours as needed.     Ascorbic Acid (VITAMIN C) 1000 MG tablet Take 1,000 mg by mouth daily.     calcium carbonate (OS-CAL - DOSED IN MG OF ELEMENTAL CALCIUM) 1250 (500 Ca) MG tablet Take 1 tablet by mouth daily with breakfast.      Cranberry 50 MG CHEW Chew by mouth.     escitalopram (LEXAPRO) 20 MG tablet Take 1 tablet (20 mg total) by mouth daily. 90 tablet 3   famotidine (PEPCID) 10 MG tablet Take 1 tablet (10 mg total) by mouth 2 (two) times daily as needed for heartburn or indigestion.     hydrOXYzine (ATARAX/VISTARIL) 25 MG tablet  TAKE 1-2 TABLETS (25-50 MG TOTAL) BY MOUTH AT BEDTIME AS NEEDED FOR ANXIETY (INSOMNIA). 180 tablet 2   lovastatin (MEVACOR) 20 MG tablet TAKE 1 TABLET BY MOUTH EVERYDAY AT BEDTIME 90 tablet 3   melatonin 3 MG TABS tablet Take 3 mg by mouth at bedtime.     vitamin B-12 (CYANOCOBALAMIN) 1000 MCG tablet Take 1,000 mcg by mouth daily.     LORazepam (ATIVAN) 0.5 MG tablet Take 1 tablet (0.5 mg total) by mouth every 6 (six) hours as needed for anxiety. (Patient not taking: Reported on 10/20/2021) 3 tablet 0   metoprolol succinate (TOPROL-XL) 25 MG 24 hr tablet Take by mouth. (Patient not taking: Reported on 10/20/2021)     ondansetron (ZOFRAN) 8 MG tablet One pill every 8 hours as needed for nausea/vomitting. (Patient not taking: Reported on 10/20/2021) 40 tablet 1   prochlorperazine (COMPAZINE) 10 MG tablet Take 1 tablet (10 mg total) by mouth every 6 (six) hours as needed for nausea or vomiting. (Patient not taking: Reported on 10/20/2021) 40 tablet 1   No current facility-administered medications for this visit.    PHYSICAL EXAMINATION: ECOG PERFORMANCE STATUS: 1 - Symptomatic but completely ambulatory  Vitals:   12/15/21 0831  BP: 126/67  Pulse: 75  Resp: 20  Temp: (!) 96.6 F (35.9 C)  SpO2: 98%   Filed Weights   12/15/21 0831  Weight: 173 lb 4.8 oz (78.6 kg)    Physical Exam Vitals and nursing note reviewed.  HENT:     Head: Normocephalic and atraumatic.     Mouth/Throat:     Pharynx: Oropharynx is clear.  Eyes:     Extraocular Movements: Extraocular movements intact.     Pupils: Pupils are equal, round, and reactive to light.  Cardiovascular:     Rate and Rhythm: Normal rate  and regular rhythm.  Pulmonary:     Comments: Decreased breath sounds bilaterally.  Abdominal:     Palpations: Abdomen is soft.  Musculoskeletal:        General: Normal range of motion.     Cervical back: Normal range of motion.  Skin:    General: Skin is warm.  Neurological:     General: No focal deficit present.     Mental Status: She is alert and oriented to person, place, and time.  Psychiatric:        Behavior: Behavior normal.        Judgment: Judgment normal.     LABORATORY DATA:  I have reviewed the data as listed Lab Results  Component Value Date   WBC 9.7 12/15/2021   HGB 12.4 12/15/2021   HCT 37.4 12/15/2021   MCV 98.9 12/15/2021   PLT 182 12/15/2021   Recent Labs    11/03/21 0859 11/24/21 0852 12/15/21 0809  NA 138 138 139  K 4.0 3.9 4.1  CL 107 106 105  CO2 _0 GLUCOSE 135* 181* 146*  BUN _1 CREATININE 0.90 0.76 0.85  CALCIUM 9.0 9.0 8.9  GFRNONAA >60 >60 >60  PROT 6.7 6.6 6.7  ALBUMIN 3.9 4.0 4.1  AST _2 ALT _3 ALKPHOS 73 83 88  BILITOT 0.4 0.5 0.3    RADIOGRAPHIC STUDIES: I have personally reviewed the radiological images as listed and agreed with the findings in the report. No results found.   Ovarian cancer on left [facility]) #High-grade serous ovarian cancer-left side-pT1c [stage Ic-spillage during surgery].  adjuvant chemotherapy with CarboTaxol every 3  weeks x 6 cycles.  Discussed the goal of therapy is cure.   #Proceed with cycle #3 of chemotherapy-carbo Taxol every 3 weeks. Labs today reviewed;  acceptable for treatment today.   #Recent UTI-Proteus mirabilis s/p treatment with cephalosporin.  Symptoms-improved monitor closely  #Mild hypercalcemia calcium 8.5.  Continue calcium 600; add 2000 units of vitamin D; check vitamin D levels.  # Post BF- 181;  non-diabetic- monitor for now. Discussed QK:SKSHNGI down carbs/ low sugar fruits etc. Hold off processed food.  Check hemoglobin A1c next visit.   # IV  access: PIV.   # Genetics: awaiting genetics evaluation today.   # Vaccination: s/p  flu shot; no COVID- RSV shot  # DISPOSITION:  # chemo today; udenyca- tomorrow.     # follow up in 3 weeks- MD; labs- cbc/cmp; - ca 125; Hb A1C-;- CARBO-TAXOL; D-2 Ellen Henri- Dr.B     # PAIN CONTROL  # GENETICS:  # CLINICAL TRIALS:  # DISPOSITION: # labs today- ordered # Follow up-   Thank you Dr. for allowing me to participate in the care of your pleasant patient. Please do not hesitate to contact me with questions or concerns in the interim.  Above plan of care was discussed with patient/family in detail.  My contact information was given to the patient/family.     Cammie Sickle, MD 12/15/2021 9:07 AM

## 2021-12-15 NOTE — Progress Notes (Signed)
Patient has question about treatment dates.Patient wants to know would she be able to go to [place] in January (for about a month).

## 2021-12-15 NOTE — Assessment & Plan Note (Addendum)
#  High-grade serous ovarian cancer-left side-pT1c [stage Ic-spillage during surgery].  adjuvant chemotherapy with CarboTaxol every 3 weeks x 6 cycles.  Discussed the goal of therapy is cure.   #Proceed with cycle #4 of chemotherapy-carbo Taxol every 3 weeks. Labs today reviewed;  acceptable for treatment today.   #Mild hypercalcemia calcium 8.22 Oct 2021- Vit 25-OH: 42.  Continue calcium 600; add 2000 units of vitamin D;   # Post BF- 141 non-diabetic- monitor for now. Discussed WE:XHBZJIR down carbs/ low sugar fruits etc. Hold off processed food.  Check hemoglobin A1c next visit.   # IV access: PIV.   # Genetics: awaiting genetics evaluation today.   # Vaccination: s/p  flu shot; no COVID- RSV shot  # DISPOSITION:  # chemo today; udenyca- tomorrow.     # follow up in Nov 27th- MD; labs- cbc/cmp; - ca 125;  CARBO-TAXOL; D-2 Ellen Henri- Dr.B

## 2021-12-15 NOTE — Patient Instructions (Signed)
[facility]  Discharge Instructions: Thank you for choosing [facility] to provide your oncology and hematology care.  If you have a lab appointment with the Cancer Center, please go directly to the Cancer Center and check in at the registration area.  Wear comfortable clothing and clothing appropriate for easy access to any Portacath or PICC line.   We strive to give you quality time with your provider. You may need to reschedule your appointment if you arrive late (15 or more minutes).  Arriving late affects you and other patients whose appointments are after yours.  Also, if you miss three or more appointments without notifying the office, you may be dismissed from the clinic at the provider's discretion.      For prescription refill requests, have your pharmacy contact our office and allow 72 hours for refills to be completed.    Today you received the following chemotherapy and/or immunotherapy agents Taxol & Paraplatin      To help prevent nausea and vomiting after your treatment, we encourage you to take your nausea medication as directed.  BELOW ARE SYMPTOMS THAT SHOULD BE REPORTED IMMEDIATELY: *FEVER GREATER THAN 100.4 F (38 C) OR HIGHER *CHILLS OR SWEATING *NAUSEA AND VOMITING THAT IS NOT CONTROLLED WITH YOUR NAUSEA MEDICATION *UNUSUAL SHORTNESS OF BREATH *UNUSUAL BRUISING OR BLEEDING *URINARY PROBLEMS (pain or burning when urinating, or frequent urination) *BOWEL PROBLEMS (unusual diarrhea, constipation, pain near the anus) TENDERNESS IN MOUTH AND THROAT WITH OR WITHOUT PRESENCE OF ULCERS (sore throat, sores in mouth, or a toothache) UNUSUAL RASH, SWELLING OR PAIN  UNUSUAL VAGINAL DISCHARGE OR ITCHING   Items with * indicate a potential emergency and should be followed up as soon as possible or go to the Emergency Department if any problems should occur.  Please show the CHEMOTHERAPY ALERT CARD or IMMUNOTHERAPY ALERT CARD at  check-in to the Emergency Department and triage nurse.  Should you have questions after your visit or need to cancel or reschedule your appointment, please contact [facility]  [phone] and follow the prompts.  Office hours are 8:00 a.m. to 4:30 p.m. Monday - Friday. Please note that voicemails left after 4:00 p.m. may not be returned until the following business day.  We are closed weekends and major holidays. You have access to a nurse at all times for urgent questions. Please call the main number to the clinic [phone] and follow the prompts.  For any non-urgent questions, you may also contact your provider using MyChart. We now offer e-Visits for anyone 18 and older to request care online for non-urgent symptoms. For details visit [email or web address].   Also download the MyChart app! Go to the app store, search "MyChart", open the app, select [facility], and log in with your MyChart username and password.  Masks are optional in the cancer centers. If you would like for your care team to wear a mask while they are taking care of you, please let them know. For doctor visits, patients may have with them one support person who is at least 76 years old. At this time, visitors are not allowed in the infusion area.   

## 2021-12-16 ENCOUNTER — Inpatient Hospital Stay: Payer: PPO

## 2021-12-16 DIAGNOSIS — Z5111 Encounter for antineoplastic chemotherapy: Secondary | ICD-10-CM | POA: Diagnosis not present

## 2021-12-16 DIAGNOSIS — C562 Malignant neoplasm of left ovary: Secondary | ICD-10-CM

## 2021-12-16 MED ORDER — PEGFILGRASTIM-CBQV 6 MG/0.6ML ~~LOC~~ SOSY
6.0000 mg | PREFILLED_SYRINGE | Freq: Once | SUBCUTANEOUS | Status: AC
  Administered 2021-12-16: 6 mg via SUBCUTANEOUS
  Filled 2021-12-16: qty 0.6

## 2021-12-17 LAB — CA 125: Cancer Antigen (CA) 125: 11.5 U/mL (ref 0.0–38.1)

## 2021-12-28 ENCOUNTER — Ambulatory Visit: Payer: PPO

## 2021-12-30 DIAGNOSIS — Z6830 Body mass index (BMI) 30.0-30.9, adult: Secondary | ICD-10-CM | POA: Diagnosis not present

## 2021-12-30 DIAGNOSIS — Z9181 History of falling: Secondary | ICD-10-CM | POA: Diagnosis not present

## 2021-12-30 DIAGNOSIS — Z09 Encounter for follow-up examination after completed treatment for conditions other than malignant neoplasm: Secondary | ICD-10-CM | POA: Diagnosis not present

## 2022-01-04 ENCOUNTER — Inpatient Hospital Stay (HOSPITAL_BASED_OUTPATIENT_CLINIC_OR_DEPARTMENT_OTHER): Payer: PPO | Admitting: Obstetrics and Gynecology

## 2022-01-04 VITALS — BP 141/65 | HR 76 | Temp 98.7°F | Resp 18 | Wt 173.5 lb

## 2022-01-04 DIAGNOSIS — C562 Malignant neoplasm of left ovary: Secondary | ICD-10-CM

## 2022-01-04 DIAGNOSIS — Z5111 Encounter for antineoplastic chemotherapy: Secondary | ICD-10-CM | POA: Diagnosis not present

## 2022-01-04 MED FILL — Fosaprepitant Dimeglumine For IV Infusion 150 MG (Base Eq): INTRAVENOUS | Qty: 5 | Status: AC

## 2022-01-04 MED FILL — Dexamethasone Sodium Phosphate Inj 100 MG/10ML: INTRAMUSCULAR | Qty: 1 | Status: AC

## 2022-01-04 NOTE — Progress Notes (Signed)
Gynecologic Oncology Interval Visit   Referring Provider: Dr. Matilde Sprang  Chief Complaint: Ovarian Mass  Subjective:  TUWANDA VOKES is a 76 y.o. female s/p prior hysterectomy (ovaries in situ) who is seen in consultation from Dr. Matilde Sprang for ovarian mass. S/p ex-lap, BSO, biopsies, appendectomy, adhesiolysis with Dr. Theora Gianotti at [facility] on 09/08/21. She is s/p 4 of 6 planned cycles of adjuvant chemotherapy.   Has some fatigue but otherwise no complaints. Has some constipation and diarrhea with chemotherapy.          Component Ref Range & Units 2 wk ago 2 mo ago 3 mo ago 4 mo ago  Cancer Antigen (CA) 125 0.0 - 38.1 U/mL 11.5 14.3 CM 39.7 High  CM 526.0 High       Gynecologic Oncologic History REBEKKAH POWLESS is a pleasant female s/p prior hysterectomy (ovaries in situ) who is seen in consultation from Dr. Matilde Sprang for ovarian mass.   Patient presented to Urology for incontinence. She had elevated residual urine and ultrasound was performed which showed 12.2 x 10.1 x 2.9 cm mixed cystic and solid mass suspicious for ovarian mass.   MRI 08/04/21- Reproductive: Large cystic and solid pelvic mass centered in the pelvis. Ovarian tissue or ovary is not demonstrated on the current study. Mass in total measuring 15 x 11 x 12 cm. Soft tissue component showing restricted diffusion and enhancement centered in the lower portion of the mass shows heterogeneous T2 signal and measures 10.8 x 6.8 x 8.5 cm amidst cystic areas. There is a small amount of associated ascites. The mass abuts the vaginal apex following hysterectomy, potentially involving the LEFT vaginal apex. The "soft tissue component displays very heterogeneous enhancement and there are numerous enhancing septations throughout. Collateral pathways from LEFT ovarian vein are noted about the LEFT lateral aspect of the mass. Given that the gonadal vein can be traced to the mass suspicion is for mass of LEFT ovarian origin though this may of  parasitized flow from ovarian. Vessels on the LEFT   Other: Trace ascites. No discrete peritoneal nodularity about the pelvis.   Musculoskeletal: No suspicious bone lesion   IMPRESSION: 1. Large cystic and solid pelvic mass highly suspicious for neoplasm. Tumor may arise from the LEFT ovary based on proximity to LEFT ovarian vein though no defined normal ovarian tissue can be seen on either the RIGHT or the LEFT on today's exam. Correlate with surgical history. Sarcoma is also considered based on the marked heterogeneity seen within the lesion on the current study. There is strong restricted diffusion within and avid enhancement of the large central soft tissue component. Gyn consultation gyn consultation is suggested. 2. Trace ascites. 3. Tumor in close proximity to sigmoid colon without definitive signs of involvement also in close proximity to the urinary bladder with defined plane between urinary bladder and mass. Mass may involve the LEFT vaginal fornix. 4. Small amount of associated ascites.  She has history of hysterectomy at age 41 for endometriosis.   We recommended surgical management.  CA 125 was 526 at diagnosis  09/08/2021  Diagnostic laparoscopy, exploratory laparotomy, bilateral salpingo-oophorectomy, infracolic omentectomy, peritoneal biopsies, appendectomy with partial cecectomy, adhesiolysis (including enterolysis and ovariolysis), and posterior rectus sheath blocks  with Dr Theora Gianotti at [facility].   She has done well since surgery and has no significant complaints.  She continues on Lovenox.  A. 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  Negative for surface involvement. Bilateral fallopian tubes: Negative for carcinoma.   See synoptic report.   Comment: The tumor has an unusual microcystic morphology, but immunohistochemical studies are most consistent with  a high-grade serous carcinoma.   B.  Right ovary, oophorectomy: Right ovary with no pathologic diagnosis.  Negative for carcinoma.   C. Omentum, omentectomy: Benign fibroadipose tissue. Negative for carcinoma.   D. Left paracolic gutter, biopsy: Benign fibroadipose tissue and skeletal muscle. Negative for carcinoma.   E. Left pelvic, biopsy: Benign fibroadipose tissue and smooth muscle. Negative for carcinoma.   F. Anterior cul de sac, biopsy: Benign fibroadipose tissue and smooth muscle. Negative for carcinoma.   G. Right pelvic, biopsy: Benign fibrous tissue and smooth muscle. Negative for carcinoma.   H. Posterior cul de sac, biopsy: Benign fibrous tissue. Negative for carcinoma.   I. Appendix and portion of cecum, appendectomy and partial cecectomy: Appendix and portion of cecum with no pathologic diagnosis. Negative for carcinoma.  Pelvic washings - negative for malignancy   Genetics Assessment: Negative genetic testing. No pathogenic variants identified on the [facility] CancerNext-Expanded+RNA panel. The report date is 10/31/2021. HRD testing through [facility] was also negative and reported out 10/31/2021.     Problem List: Patient Active Problem List   Diagnosis Date Noted   Genetic testing 11/02/2021   Ovarian cancer on left ([place]) 09/28/2021   Depression, unspecified 09/06/2021   Mild intermittent asthma 09/06/2021   Ovarian mass 08/10/2021   PSVT (paroxysmal supraventricular tachycardia) 08/14/2019   Vitamin D deficiency 06/10/2019   Current mild episode of major depressive disorder without prior episode ([place]) 10/15/2018   GAD (generalized anxiety disorder) 10/15/2018   Personal history of colonic polyps    Obesity (BMI 30.0-34.9) 05/29/2018   GERD (gastroesophageal reflux disease) 11/27/2017   Osteopenia 11/27/2017   Mixed hyperlipidemia 12/09/2014   Insomnia due to stress 12/09/2014    Past Medical History: Past Medical History:  Diagnosis  Date   Allergy    Arthritis    Asthma    Atypical chest pain 03/14/2019   Atypical mole 06/12/2018   right prox lat thigh/mod   Atypical mole 05/19/2014   left sup med buttock/excision   Atypical mole 03/26/2014   right sup lat buttock   Atypical mole 03/26/2013   left lat mid back   Atypical mole 11/08/2006   right shoulder superior/excision   Basal cell carcinoma 06/12/2016   right medial sup canthus below eyebrow   Basal cell carcinoma 09/23/2014   left nasal bridge   Basal cell carcinoma 03/26/2013   left nasal sidewall   Basal cell carcinoma 08/27/2012   left nasal dorsum   Cancer (HCC)    shoulder, nose skin CA- basal cell CA   GERD (gastroesophageal reflux disease)    occasional   Hyperlipidemia    Osteopenia    Polyp of transverse colon    Right arm weakness 03/14/2019   Stress due to illness of family member 05/29/2018    Past Surgical History: Past Surgical History:  Procedure Laterality Date   BREAST BIOPSY Right    core bx- neg   COLONOSCOPY     COLONOSCOPY WITH PROPOFOL N/A 10/08/2018   Procedure: COLONOSCOPY WITH PROPOFOL;  Surgeon: Lucilla Lame, MD;  [name] ENDOSCOPY;  Service: Endoscopy;  Laterality: N/A;   MOHS SURGERY     TONSILLECTOMY     VAGINAL HYSTERECTOMY      Past Gynecologic History:  Menarche: age 56 Hysterectomy at age 36.    OB  History:  OB History  No obstetric history on file.    Family History: Family History  Problem Relation Age of Onset   Lung cancer Mother    Diabetes Father    Parkinson's disease Father    Alzheimer's disease Father    Heart attack Father 69   Alcohol abuse Sister    Heart disease Sister    Lung cancer Sister 57   Down syndrome Sister    Colon cancer Paternal Uncle        d. 87s   Melanoma Maternal Grandmother    Stroke Paternal Grandmother    Lung cancer Paternal Grandmother        d. 40s   Stomach cancer Paternal Grandfather        d. 75s   Liver cancer Son 62   Esophageal cancer  Neg Hx    Breast cancer Neg Hx     Social History: Social History   Socioeconomic History   Marital status: Married    Spouse name: [name]   Number of children: 2   Years of education: Not on file   Highest education level: Bachelor's degree (e.g., BA, AB, BS)  Occupational History   Occupation: Retired  Tobacco Use   Smoking status: Former    Packs/day: 1.50    Years: 20.00    Total pack years: 30.00    Types: Cigarettes    Quit date: 1993    Years since quitting: 30.9    Passive exposure: Past   Smokeless tobacco: Never  Vaping Use   Vaping Use: Never used  Substance and Sexual Activity   Alcohol use: Yes    Alcohol/week: 1.0 standard drink of alcohol    Types: 1 Glasses of wine per week    Comment: one glass of wine once a month   Drug use: Never   Sexual activity: Not Currently  Other Topics Concern   Not on file  Social History Narrative   Lives with husband in [place]; quit smoking > 30 years; once every 1-2 months; retd. Teacher- elementary school;    Social Determinants of Health   Financial Resource Strain: Low Risk  (03/03/2021)   Overall Financial Resource Strain (CARDIA)    Difficulty of Paying Living Expenses: Not hard at all  Food Insecurity: No Food Insecurity (03/03/2021)   Hunger Vital Sign    Worried About Running Out of Food in the Last Year: Never true    Ran Out of Food in the Last Year: Never true  Transportation Needs: No Transportation Needs (03/03/2021)   PRAPARE - Hydrologist (Medical): No    Lack of Transportation (Non-Medical): No  Physical Activity: Sufficiently Active (03/03/2021)   Exercise Vital Sign    Days of Exercise per Week: 7 days    Minutes of Exercise per Session: 30 min  Stress: No Stress Concern Present (03/03/2021)   [place]    Feeling of Stress : Not at all  Social Connections: Moderately Integrated (03/03/2021)   Social  Connection and Isolation Panel [NHANES]    Frequency of Communication with Friends and Family: More than three times a week    Frequency of Social Gatherings with Friends and Family: Three times a week    Attends Religious Services: More than 4 times per year    Active Member of Clubs or Organizations: No    Attends Archivist Meetings: Never    Marital Status: Married  Intimate Partner Violence: Not At Risk (03/03/2021)   Humiliation, Afraid, Rape, and Kick questionnaire    Fear of Current or Ex-Partner: No    Emotionally Abused: No    Physically Abused: No    Sexually Abused: No    Allergies: Allergies  Allergen Reactions   Tetanus Toxoids Shortness Of Breath   Sulfa Antibiotics     Severe vomiting    Current Medications: Current Outpatient Medications  Medication Sig Dispense Refill   acetaminophen (TYLENOL) 325 MG tablet Take 650 mg by mouth every 6 (six) hours as needed.     Ascorbic Acid (VITAMIN C) 1000 MG tablet Take 1,000 mg by mouth daily.     calcium carbonate (OS-CAL - DOSED IN MG OF ELEMENTAL CALCIUM) 1250 (500 Ca) MG tablet Take 1 tablet by mouth daily with breakfast.     Cranberry 50 MG CHEW Chew by mouth.     escitalopram (LEXAPRO) 20 MG tablet Take 1 tablet (20 mg total) by mouth daily. 90 tablet 3   famotidine (PEPCID) 10 MG tablet Take 1 tablet (10 mg total) by mouth 2 (two) times daily as needed for heartburn or indigestion.     hydrOXYzine (ATARAX/VISTARIL) 25 MG tablet TAKE 1-2 TABLETS (25-50 MG TOTAL) BY MOUTH AT BEDTIME AS NEEDED FOR ANXIETY (INSOMNIA). 180 tablet 2   lovastatin (MEVACOR) 20 MG tablet TAKE 1 TABLET BY MOUTH EVERYDAY AT BEDTIME 90 tablet 3   melatonin 3 MG TABS tablet Take 3 mg by mouth at bedtime.     vitamin B-12 (CYANOCOBALAMIN) 1000 MCG tablet Take 1,000 mcg by mouth daily.     LORazepam (ATIVAN) 0.5 MG tablet Take 1 tablet (0.5 mg total) by mouth every 6 (six) hours as needed for anxiety. (Patient not taking: Reported on  10/20/2021) 3 tablet 0   metoprolol succinate (TOPROL-XL) 25 MG 24 hr tablet Take by mouth. (Patient not taking: Reported on 10/20/2021)     ondansetron (ZOFRAN) 8 MG tablet One pill every 8 hours as needed for nausea/vomitting. (Patient not taking: Reported on 10/20/2021) 40 tablet 1   prochlorperazine (COMPAZINE) 10 MG tablet Take 1 tablet (10 mg total) by mouth every 6 (six) hours as needed for nausea or vomiting. (Patient not taking: Reported on 10/20/2021) 40 tablet 1   No current facility-administered medications for this visit.    Review of Systems General:  fatigue Skin: no complaints Eyes: no complaints HEENT: no complaints Breasts: no complaints Pulmonary: no complaints Cardiac: no complaints Gastrointestinal: no complaints Genitourinary/Sexual: no complaints Ob/Gyn: no complaints Musculoskeletal: no complaints Hematology: no complaints Neurologic/Psych: no complaints   Objective:  Physical Examination:  BP (!) 141/65   Pulse 76   Temp 98.7 F (37.1 C)   Resp 18   Wt 173 lb 8 oz (78.7 kg)   LMP  (LMP Unknown)   SpO2 97%   BMI 29.78 kg/m     ECOG Performance Status: 1 - Symptomatic but completely ambulatory  GENERAL: Patient is a well appearing female in no acute distress HEENT:  Sclerae anicteric.  NODES:  No cervical, supraclavicular, or axillary lymphadenopathy palpated.  LUNGS:  Clear to auscultation bilaterally.  No wheezes or rhonchi. HEART:  Regular rate and rhythm. No murmur appreciated. ABDOMEN:  Soft, nontender.  Positive, normoactive bowel sounds. Well healed surgical scars MSK:  ambulatory EXTREMITIES:  No peripheral edema.   SKIN:  Clear with no obvious rashes or skin changes. NEURO:  Nonfocal. Well oriented.  Appropriate affect.  Pelvic: Exam Chaperoned by CMA EGBUS:  no lesions Cervix: absent Vagina: no lesions, + yellowish discharge, no bleeding Uterus: absent BME: no palpable masses Rectovaginal: deferred  Lab Review:  No labs on site. Labs  reviewed per hpi  Radiologic Imaging: No imaging on site.     Assessment:  DONZELLA CARROL is a 76 y.o. female stage Ic high grade serous cancer of the ovary s/p diagnostic laparoscopy, ex lap, BSO, infracolic omentectomy, peritoneal biopsies, appendectomy with partial cecectomy, adhesiolysis, and posterior rectus sheath blocks. She had prior hysterectomy. Pathology consistent with high grade serous. Exam NED. Normal CA125.   Medical co-morbidities complicating care: prior vaginal hysterectomy; asthma, and prior skin cancers Plan:   Problem List Items Addressed This Visit       Endocrine   Ovarian cancer on left [facility]) - Primary    Continue chemotherapy and discussed maintenance vs surveillance options with her medical oncology team.  Follow-up in 3 months  The patient's diagnosis, an outline of the further diagnostic and laboratory studies which will be required, the recommendation for surgery, and alternatives were discussed with her and her accompanying family members.  All questions were answered to their satisfaction.  Beckey Rutter, DNP, AGNP-C [place] at [facility] [phone] (clinic)  I personally had a face to face interaction and evaluated the patient jointly with the NP, Ms. Beckey Rutter.  I have reviewed her history and available records and have performed the key portions of the physical exam including HEENT, general, abdominal exam, pelvic exam with my findings confirming those documented above by the APP.  I have discussed the case with the APP and the patient.  I agree with the above documentation, assessment and plan which was fully formulated by me.  Counseling was completed by me.   I personally saw the patient and performed a substantive portion of this encounter in conjunction with the listed APP as documented above.  Modupe Shampine Gaetana Michaelis, MD

## 2022-01-09 ENCOUNTER — Inpatient Hospital Stay: Payer: PPO

## 2022-01-09 ENCOUNTER — Inpatient Hospital Stay (HOSPITAL_BASED_OUTPATIENT_CLINIC_OR_DEPARTMENT_OTHER): Payer: PPO | Admitting: Internal Medicine

## 2022-01-09 ENCOUNTER — Encounter: Payer: Self-pay | Admitting: Internal Medicine

## 2022-01-09 VITALS — BP 123/72 | HR 74 | Temp 97.2°F | Resp 20 | Wt 173.8 lb

## 2022-01-09 DIAGNOSIS — C562 Malignant neoplasm of left ovary: Secondary | ICD-10-CM

## 2022-01-09 DIAGNOSIS — Z5111 Encounter for antineoplastic chemotherapy: Secondary | ICD-10-CM | POA: Diagnosis not present

## 2022-01-09 LAB — CBC WITH DIFFERENTIAL/PLATELET
Abs Immature Granulocytes: 0.03 10*3/uL (ref 0.00–0.07)
Basophils Absolute: 0.1 10*3/uL (ref 0.0–0.1)
Basophils Relative: 1 %
Eosinophils Absolute: 0.3 10*3/uL (ref 0.0–0.5)
Eosinophils Relative: 4 %
HCT: 35.6 % — ABNORMAL LOW (ref 36.0–46.0)
Hemoglobin: 11.9 g/dL — ABNORMAL LOW (ref 12.0–15.0)
Immature Granulocytes: 0 %
Lymphocytes Relative: 41 %
Lymphs Abs: 3.1 10*3/uL (ref 0.7–4.0)
MCH: 33 pg (ref 26.0–34.0)
MCHC: 33.4 g/dL (ref 30.0–36.0)
MCV: 98.6 fL (ref 80.0–100.0)
Monocytes Absolute: 0.4 10*3/uL (ref 0.1–1.0)
Monocytes Relative: 5 %
Neutro Abs: 3.7 10*3/uL (ref 1.7–7.7)
Neutrophils Relative %: 49 %
Platelets: 167 10*3/uL (ref 150–400)
RBC: 3.61 MIL/uL — ABNORMAL LOW (ref 3.87–5.11)
RDW: 18.1 % — ABNORMAL HIGH (ref 11.5–15.5)
WBC: 7.6 10*3/uL (ref 4.0–10.5)
nRBC: 0 % (ref 0.0–0.2)

## 2022-01-09 LAB — COMPREHENSIVE METABOLIC PANEL
ALT: 16 U/L (ref 0–44)
AST: 21 U/L (ref 15–41)
Albumin: 3.7 g/dL (ref 3.5–5.0)
Alkaline Phosphatase: 75 U/L (ref 38–126)
Anion gap: 6 (ref 5–15)
BUN: 14 mg/dL (ref 8–23)
CO2: 26 mmol/L (ref 22–32)
Calcium: 8.8 mg/dL — ABNORMAL LOW (ref 8.9–10.3)
Chloride: 105 mmol/L (ref 98–111)
Creatinine, Ser: 0.75 mg/dL (ref 0.44–1.00)
GFR, Estimated: >60 mL/min (ref >60)
Glucose, Bld: 146 mg/dL — ABNORMAL HIGH (ref 70–99)
Potassium: 3.9 mmol/L (ref 3.5–5.1)
Sodium: 137 mmol/L (ref 135–145)
Total Bilirubin: 0.7 mg/dL (ref 0.3–1.2)
Total Protein: 6.5 g/dL (ref 6.5–8.1)

## 2022-01-09 MED ORDER — SODIUM CHLORIDE 0.9 % IV SOLN
Freq: Once | INTRAVENOUS | Status: AC
  Filled 2022-01-09: qty 250

## 2022-01-09 MED ORDER — SODIUM CHLORIDE 0.9 % IV SOLN
175.0000 mg/m2 | Freq: Once | INTRAVENOUS | Status: AC
  Administered 2022-01-09: 330 mg via INTRAVENOUS
  Filled 2022-01-09: qty 55

## 2022-01-09 MED ORDER — FAMOTIDINE IN NACL 20-0.9 MG/50ML-% IV SOLN
20.0000 mg | Freq: Once | INTRAVENOUS | Status: AC
  Administered 2022-01-09: 20 mg via INTRAVENOUS
  Filled 2022-01-09: qty 50

## 2022-01-09 MED ORDER — SODIUM CHLORIDE 0.9 % IV SOLN
10.0000 mg | Freq: Once | INTRAVENOUS | Status: AC
  Administered 2022-01-09: 10 mg via INTRAVENOUS
  Filled 2022-01-09: qty 10

## 2022-01-09 MED ORDER — PALONOSETRON HCL INJECTION 0.25 MG/5ML
0.2500 mg | Freq: Once | INTRAVENOUS | Status: AC
  Administered 2022-01-09: 0.25 mg via INTRAVENOUS
  Filled 2022-01-09: qty 5

## 2022-01-09 MED ORDER — DIPHENHYDRAMINE HCL 50 MG/ML IJ SOLN
50.0000 mg | Freq: Once | INTRAMUSCULAR | Status: AC
  Administered 2022-01-09: 50 mg via INTRAVENOUS
  Filled 2022-01-09: qty 1

## 2022-01-09 MED ORDER — SODIUM CHLORIDE 0.9 % IV SOLN
425.5000 mg | Freq: Once | INTRAVENOUS | Status: AC
  Administered 2022-01-09: 430 mg via INTRAVENOUS
  Filled 2022-01-09: qty 43

## 2022-01-09 MED ORDER — SODIUM CHLORIDE 0.9 % IV SOLN
150.0000 mg | Freq: Once | INTRAVENOUS | Status: AC
  Administered 2022-01-09: 150 mg via INTRAVENOUS
  Filled 2022-01-09: qty 150

## 2022-01-09 NOTE — Patient Instructions (Signed)
[facility]  Discharge Instructions: Thank you for choosing [facility] to provide your oncology and hematology care.  If you have a lab appointment with the Cancer Center, please go directly to the Cancer Center and check in at the registration area.  Wear comfortable clothing and clothing appropriate for easy access to any Portacath or PICC line.   We strive to give you quality time with your provider. You may need to reschedule your appointment if you arrive late (15 or more minutes).  Arriving late affects you and other patients whose appointments are after yours.  Also, if you miss three or more appointments without notifying the office, you may be dismissed from the clinic at the provider's discretion.      For prescription refill requests, have your pharmacy contact our office and allow 72 hours for refills to be completed.    Today you received the following chemotherapy and/or immunotherapy agents Taxol, Paraplatin      To help prevent nausea and vomiting after your treatment, we encourage you to take your nausea medication as directed.  BELOW ARE SYMPTOMS THAT SHOULD BE REPORTED IMMEDIATELY: *FEVER GREATER THAN 100.4 F (38 C) OR HIGHER *CHILLS OR SWEATING *NAUSEA AND VOMITING THAT IS NOT CONTROLLED WITH YOUR NAUSEA MEDICATION *UNUSUAL SHORTNESS OF BREATH *UNUSUAL BRUISING OR BLEEDING *URINARY PROBLEMS (pain or burning when urinating, or frequent urination) *BOWEL PROBLEMS (unusual diarrhea, constipation, pain near the anus) TENDERNESS IN MOUTH AND THROAT WITH OR WITHOUT PRESENCE OF ULCERS (sore throat, sores in mouth, or a toothache) UNUSUAL RASH, SWELLING OR PAIN  UNUSUAL VAGINAL DISCHARGE OR ITCHING   Items with * indicate a potential emergency and should be followed up as soon as possible or go to the Emergency Department if any problems should occur.  Please show the CHEMOTHERAPY ALERT CARD or IMMUNOTHERAPY ALERT CARD at  check-in to the Emergency Department and triage nurse.  Should you have questions after your visit or need to cancel or reschedule your appointment, please contact [facility]  [phone] and follow the prompts.  Office hours are 8:00 a.m. to 4:30 p.m. Monday - Friday. Please note that voicemails left after 4:00 p.m. may not be returned until the following business day.  We are closed weekends and major holidays. You have access to a nurse at all times for urgent questions. Please call the main number to the clinic [phone] and follow the prompts.  For any non-urgent questions, you may also contact your provider using MyChart. We now offer e-Visits for anyone 18 and older to request care online for non-urgent symptoms. For details visit [email or web address].   Also download the MyChart app! Go to the app store, search "MyChart", open the app, select , and log in with your MyChart username and password.  Masks are optional in the cancer centers. If you would like for your care team to wear a mask while they are taking care of you, please let them know. For doctor visits, patients may have with them one support person who is at least 76 years old. At this time, visitors are not allowed in the infusion area.   

## 2022-01-09 NOTE — Progress Notes (Signed)
[facility] NOTE  Patient Care Team: [name], PA-C as PCP - General (Family Medicine) End, Harrell Gave, MD as PCP - Cardiology (Cardiology) Ralene Bathe, MD (Dermatology) Arelia Sneddon, [place] (Optometry) Vladimir Crofts, MD as Consulting Physician (Neurology) Beverly Gust, MD (Otolaryngology) Clent Jacks, RN as Oncology Nurse Navigator  CHIEF COMPLAINTS/PURPOSE OF CONSULTATION: Ovarian cancer.   Oncology History Overview Note  s/p prior hysterectomy (ovaries in situ); Dr. Matilde Sprang for ovarian mass.   incontinence.MRI 08/04/21- Reproductive: Large cystic and solid pelvic mass centered in the pelvis. Ovarian tissue or ovary is not demonstrated on the current study. Mass in total measuring 15 x 11 x 12 cm. Soft tissue component showing restricted diffusion and enhancement centered in the lower portion of the mass shows heterogeneous T2 signal and measures 10.8 x 6.8 x 8.5 cm amidst cystic areas. There is a small amount of associated ascites. The mass abuts the vaginal apex following hysterectomy, potentially involving the LEFT vaginal apex. The "soft tissue component displays very heterogeneous enhancement and there are numerous enhancing septations throughout. Collateral pathways from LEFT ovarian vein are noted about the LEFT lateral aspect of the mass. Given that the gonadal vein can be traced to the mass suspicion is for mass of LEFT ovarian origin though this may of parasitized flow from ovarian. 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 Theora Gianotti; pelvic washings negative]   # AUG 30th, 2023- Carbo-taxol; with GCSF   # PSVT [Dr.Gollan] on metoprolol   Ovarian cancer on left ([place])  09/28/2021 Initial Diagnosis   Ovarian cancer on left ([place])   09/28/2021 Cancer Staging   Staging  form: Ovary, Fallopian Tube, and Primary Peritoneal Carcinoma, AJCC 8th Edition - Clinical: Stage IC (cT1c1, cN0, cM0) - Signed by Cammie Sickle, MD on 09/28/2021   10/13/2021 -  Chemotherapy   Patient is on Treatment Plan : OVARIAN Carboplatin (AUC 6) + Paclitaxel (175) q21d X 6 Cycles      Genetic Testing   Negative genetic testing. No pathogenic variants identified on the [facility] CancerNext-Expanded+RNA panel. The report date is 10/31/2021. HRD testing through [facility] was also negative and reported out 10/31/2021.  The Multi-Cancer Panel + RNA offered by [facility] includes sequencing and/or deletion duplication testing of the following 84 genes: AIP, ALK, APC, ATM, AXIN2,BAP1,  BARD1, BLM, BMPR1A, BRCA1, BRCA2, BRIP1, CASR, CDC73, CDH1, CDK4, CDKN1B, CDKN1C, CDKN2A (p14ARF), CDKN2A (p16INK4a), CEBPA, CHEK2, CTNNA1, DICER1, DIS3L2, EGFR (c.2369C>T, p.Thr790Met variant only), EPCAM (Deletion/duplication testing only), FH, FLCN, GATA2, GPC3, GREM1 (Promoter region deletion/duplication testing only), HOXB13 (c.251G>A, p.Gly84Glu), HRAS, KIT, MAX, MEN1, MET, MITF (c.952G>A, p.Glu318Lys variant only), MLH1, MSH2, MSH3, MSH6, MUTYH, NBN, NF1, NF2, NTHL1, PALB2, PDGFRA, PHOX2B, PMS2, POLD1, POLE, POT1, PRKAR1A, PTCH1, PTEN, RAD50, RAD51C, RAD51D, RB1, RECQL4, RET, RUNX1, SDHAF2, SDHA (sequence changes only), SDHB, SDHC, SDHD, SMAD4, SMARCA4, SMARCB1, SMARCE1, STK11, SUFU, TERC, TERT, TMEM127, TP53, TSC1, TSC2, VHL, WRN and WT1.      HISTORY OF PRESENTING ILLNESS: Ambulating independently.  Alone.   [name] 76 y.o.  female with high-grade serous ovarian cancer stage Ic currently on adjuvant chemotherapy-CarboTaxol is here for follow-up.  In the interim patient was evaluated by gynecology oncology.  Patient is currently status post 4.  cycles of CarboTaxol.  Mild tingling and numbness; not in significantly worse.  Patient has  an increase in fatigue. NO Nausea or vomiting.  alternating  between diarrhea and constipation.   Review of Systems  Constitutional:  Positive for malaise/fatigue. Negative for chills, diaphoresis, fever and weight loss.  HENT:  Negative for nosebleeds and sore throat.   Eyes:  Negative for double vision.  Respiratory:  Negative for cough, hemoptysis, sputum production, shortness of breath and wheezing.   Cardiovascular:  Negative for chest pain, palpitations, orthopnea and leg swelling.  Gastrointestinal:  Negative for abdominal pain, blood in stool, constipation, diarrhea, heartburn, melena, nausea and vomiting.  Genitourinary:  Negative for dysuria, frequency and urgency.  Musculoskeletal:  Negative for back pain and joint pain.  Skin: Negative.  Negative for itching and rash.  Neurological:  Negative for dizziness, tingling, focal weakness, weakness and headaches.  Endo/Heme/Allergies:  Does not bruise/bleed easily.  Psychiatric/Behavioral:  Negative for depression. The patient is not nervous/anxious and does not have insomnia.     MEDICAL HISTORY:  Past Medical History:  Diagnosis Date   Allergy    Arthritis    Asthma    Atypical chest pain 03/14/2019   Atypical mole 06/12/2018   right prox lat thigh/mod   Atypical mole 05/19/2014   left sup med buttock/excision   Atypical mole 03/26/2014   right sup lat buttock   Atypical mole 03/26/2013   left lat mid back   Atypical mole 11/08/2006   right shoulder superior/excision   Basal cell carcinoma 06/12/2016   right medial sup canthus below eyebrow   Basal cell carcinoma 09/23/2014   left nasal bridge   Basal cell carcinoma 03/26/2013   left nasal sidewall   Basal cell carcinoma 08/27/2012   left nasal dorsum   Cancer (HCC)    shoulder, nose skin CA- basal cell CA   GERD (gastroesophageal reflux disease)    occasional   Hyperlipidemia    Osteopenia    Polyp of transverse colon    Right arm weakness 03/14/2019   Stress due to illness of family member 05/29/2018    SURGICAL  HISTORY: Past Surgical History:  Procedure Laterality Date   BREAST BIOPSY Right    core bx- neg   COLONOSCOPY     COLONOSCOPY WITH PROPOFOL N/A 10/08/2018   Procedure: COLONOSCOPY WITH PROPOFOL;  Surgeon: Lucilla Lame, MD;  Location: [facility] ENDOSCOPY;  Service: Endoscopy;  Laterality: N/A;   MOHS SURGERY     TONSILLECTOMY     VAGINAL HYSTERECTOMY      SOCIAL HISTORY: Social History   Socioeconomic History   Marital status: Married    Spouse name: [name]   Number of children: 2   Years of education: Not on file   Highest education level: Bachelor's degree (e.g., BA, AB, BS)  Occupational History   Occupation: Retired  Tobacco Use   Smoking status: Former    Packs/day: 1.50    Years: 20.00    Total pack years: 30.00    Types: Cigarettes    Quit date: 1993    Years since quitting: 30.9    Passive exposure: Past   Smokeless tobacco: Never  Vaping Use   Vaping Use: Never used  Substance and Sexual Activity   Alcohol use: Yes    Alcohol/week: 1.0 standard drink of alcohol    Types: 1 Glasses of wine per week    Comment: one glass of wine once a month   Drug use: Never   Sexual activity: Not Currently  Other Topics Concern   Not on file  Social History Narrative  Lives with husband in [place]; quit smoking > 30 years; once every 1-2 months; retd. Teacher- elementary school;    Social Determinants of Health   Financial Resource Strain: Low Risk  (03/03/2021)   Overall Financial Resource Strain (CARDIA)    Difficulty of Paying Living Expenses: Not hard at all  Food Insecurity: No Food Insecurity (03/03/2021)   Hunger Vital Sign    Worried About Running Out of Food in the Last Year: Never true    Ran Out of Food in the Last Year: Never true  Transportation Needs: No Transportation Needs (03/03/2021)   PRAPARE - Hydrologist (Medical): No    Lack of Transportation (Non-Medical): No  Physical Activity: Sufficiently Active (03/03/2021)    Exercise Vital Sign    Days of Exercise per Week: 7 days    Minutes of Exercise per Session: 30 min  Stress: No Stress Concern Present (03/03/2021)   [place]    Feeling of Stress : Not at all  Social Connections: Moderately Integrated (03/03/2021)   Social Connection and Isolation Panel [NHANES]    Frequency of Communication with Friends and Family: More than three times a week    Frequency of Social Gatherings with Friends and Family: Three times a week    Attends Religious Services: More than 4 times per year    Active Member of Clubs or Organizations: No    Attends Archivist Meetings: Never    Marital Status: Married  Human resources officer Violence: Not At Risk (03/03/2021)   Humiliation, Afraid, Rape, and Kick questionnaire    Fear of Current or Ex-Partner: No    Emotionally Abused: No    Physically Abused: No    Sexually Abused: No    FAMILY HISTORY: Family History  Problem Relation Age of Onset   Lung cancer Mother    Diabetes Father    Parkinson's disease Father    Alzheimer's disease Father    Heart attack Father 36   Alcohol abuse Sister    Heart disease Sister    Lung cancer Sister 43   Down syndrome Sister    Colon cancer Paternal Uncle        d. 59s   Melanoma Maternal Grandmother    Stroke Paternal Grandmother    Lung cancer Paternal Grandmother        d. 81s   Stomach cancer Paternal Grandfather        d. 77s   Liver cancer Son 42   Esophageal cancer Neg Hx    Breast cancer Neg Hx     ALLERGIES:  is allergic to tetanus toxoids and sulfa antibiotics.  MEDICATIONS:  Current Outpatient Medications  Medication Sig Dispense Refill   acetaminophen (TYLENOL) 325 MG tablet Take 650 mg by mouth every 6 (six) hours as needed.     Ascorbic Acid (VITAMIN C) 1000 MG tablet Take 1,000 mg by mouth daily.     calcium carbonate (OS-CAL - DOSED IN MG OF ELEMENTAL CALCIUM) 1250 (500 Ca) MG tablet  Take 1 tablet by mouth daily with breakfast.     Cranberry 50 MG CHEW Chew by mouth.     escitalopram (LEXAPRO) 20 MG tablet Take 1 tablet (20 mg total) by mouth daily. 90 tablet 3   famotidine (PEPCID) 10 MG tablet Take 1 tablet (10 mg total) by mouth 2 (two) times daily as needed for heartburn or indigestion.     hydrOXYzine (ATARAX/VISTARIL) 25  MG tablet TAKE 1-2 TABLETS (25-50 MG TOTAL) BY MOUTH AT BEDTIME AS NEEDED FOR ANXIETY (INSOMNIA). 180 tablet 2   lovastatin (MEVACOR) 20 MG tablet TAKE 1 TABLET BY MOUTH EVERYDAY AT BEDTIME 90 tablet 3   melatonin 3 MG TABS tablet Take 3 mg by mouth at bedtime.     vitamin B-12 (CYANOCOBALAMIN) 1000 MCG tablet Take 1,000 mcg by mouth daily.     LORazepam (ATIVAN) 0.5 MG tablet Take 1 tablet (0.5 mg total) by mouth every 6 (six) hours as needed for anxiety. (Patient not taking: Reported on 10/20/2021) 3 tablet 0   metoprolol succinate (TOPROL-XL) 25 MG 24 hr tablet Take by mouth. (Patient not taking: Reported on 10/20/2021)     ondansetron (ZOFRAN) 8 MG tablet One pill every 8 hours as needed for nausea/vomitting. (Patient not taking: Reported on 10/20/2021) 40 tablet 1   prochlorperazine (COMPAZINE) 10 MG tablet Take 1 tablet (10 mg total) by mouth every 6 (six) hours as needed for nausea or vomiting. (Patient not taking: Reported on 10/20/2021) 40 tablet 1   No current facility-administered medications for this visit.   Facility-Administered Medications Ordered in Other Visits  Medication Dose Route Frequency Provider Last Rate Last Admin   0.9 %  sodium chloride infusion   Intravenous Once Cammie Sickle, MD       CARBOplatin (PARAPLATIN) 430 mg in sodium chloride 0.9 % 250 mL chemo infusion  430 mg Intravenous Once Cammie Sickle, MD       dexamethasone (DECADRON) 10 mg in sodium chloride 0.9 % 50 mL IVPB  10 mg Intravenous Once Cammie Sickle, MD       diphenhydrAMINE (BENADRYL) injection 50 mg  50 mg Intravenous Once Cammie Sickle, MD       famotidine (PEPCID) IVPB 20 mg premix  20 mg Intravenous Once Cammie Sickle, MD       fosaprepitant (EMEND) 150 mg in sodium chloride 0.9 % 145 mL IVPB  150 mg Intravenous Once Cammie Sickle, MD       PACLitaxel (TAXOL) 330 mg in sodium chloride 0.9 % 500 mL chemo infusion (> 66m/m2)  175 mg/m2 (Treatment Plan Recorded) Intravenous Once BCammie Sickle MD       palonosetron (ALOXI) injection 0.25 mg  0.25 mg Intravenous Once BCammie Sickle MD        PHYSICAL EXAMINATION: ECOG PERFORMANCE STATUS: 1 - Symptomatic but completely ambulatory  Vitals:   01/09/22 0845  BP: 123/72  Pulse: 74  Resp: 20  Temp: (!) 97.2 F (36.2 C)  SpO2: 100%   Filed Weights   01/09/22 0845  Weight: 173 lb 12.8 oz (78.8 kg)    Physical Exam Vitals and nursing note reviewed.  HENT:     Head: Normocephalic and atraumatic.     Mouth/Throat:     Pharynx: Oropharynx is clear.  Eyes:     Extraocular Movements: Extraocular movements intact.     Pupils: Pupils are equal, round, and reactive to light.  Cardiovascular:     Rate and Rhythm: Normal rate and regular rhythm.  Pulmonary:     Comments: Decreased breath sounds bilaterally.  Abdominal:     Palpations: Abdomen is soft.  Musculoskeletal:        General: Normal range of motion.     Cervical back: Normal range of motion.  Skin:    General: Skin is warm.  Neurological:     General: No focal deficit present.  Mental Status: She is alert and oriented to person, place, and time.  Psychiatric:        Behavior: Behavior normal.        Judgment: Judgment normal.     LABORATORY DATA:  I have reviewed the data as listed Lab Results  Component Value Date   WBC 7.6 01/09/2022   HGB 11.9 (L) 01/09/2022   HCT 35.6 (L) 01/09/2022   MCV 98.6 01/09/2022   PLT 167 01/09/2022   Recent Labs    11/24/21 0852 12/15/21 0809 01/09/22 0835  NA 138 139 137  K 3.9 4.1 3.9  CL 106 105 105  CO2 _0 GLUCOSE 181* 146* 146*  BUN _1 CREATININE 0.76 0.85 0.75  CALCIUM 9.0 8.9 8.8*  GFRNONAA >60 >60 >60  PROT 6.6 6.7 6.5  ALBUMIN 4.0 4.1 3.7  AST _2 ALT _3 ALKPHOS 83 88 75  BILITOT 0.5 0.3 0.7    RADIOGRAPHIC STUDIES: I have personally reviewed the radiological images as listed and agreed with the findings in the report. No results found.   Ovarian cancer on left [facility]) #High-grade serous ovarian cancer-left side-pT1c [stage Ic-spillage during surgery].  adjuvant chemotherapy with CarboTaxol every 3 weeks x 6 cycles.  Discussed the goal of therapy is cure.   #Proceed with cycle #5 of chemotherapy-carbo Taxol every 3 weeks. Labs today reviewed;  acceptable for treatment today. Reviewed the CA125- 11- SEP 2023-WNL.   #Mild hypocalcemia calcium 8.22 Oct 2021- Vit 25-OH: 42. Continue calcium 600; add 2000 units of vitamin D;   # Post BF- 141 non-diabetic- monitor for now. Discussed GO:VPCHEKB down carbs/ low sugar fruits etc. Hold off processed food.  STABLE.   # IV access: PIV.   # Genetics: s/p genetics evaluation [OCT 5248]- VUS; HRD testing also reported out negative on 10/31/2021.   # Vaccination: s/p  flu shot; no COVID- RSV shot  # DISPOSITION:  # chemo today; udenyca- tomorrow.     # follow up in 3 weeks- MD; labs- cbc/cmp; - ca 125;  CARBO-TAXOL; D-2 Ellen Henri- Dr.B     # PAIN CONTROL  # GENETICS:  # CLINICAL TRIALS:  # DISPOSITION: # labs today- ordered # Follow up-   Thank you Dr. for allowing me to participate in the care of your pleasant patient. Please do not hesitate to contact me with questions or concerns in the interim.  Above plan of care was discussed with patient/family in detail.  My contact information was given to the patient/family.     Cammie Sickle, MD 01/09/2022 9:44 AM

## 2022-01-09 NOTE — Assessment & Plan Note (Addendum)
#  High-grade serous ovarian cancer-left side-pT1c [stage Ic-spillage during surgery].  adjuvant chemotherapy with CarboTaxol every 3 weeks x 6 cycles.  Discussed the goal of therapy is cure.   #Proceed with cycle #5 of chemotherapy-carbo Taxol every 3 weeks. Labs today reviewed;  acceptable for treatment today. Reviewed the CA125- 11- SEP 2023-WNL.   #Mild hypocalcemia calcium 8.22 Oct 2021- Vit 25-OH: 42. Continue calcium 600; add 2000 units of vitamin D;   # Post BF- 141 non-diabetic- monitor for now. Discussed TS:[id number] down carbs/ low sugar fruits etc. Hold off processed food.  STABLE.   # IV access: PIV.   # Genetics: s/p genetics evaluation [OCT 3009]- VUS; HRD testing also reported out negative on 10/31/2021.   # Vaccination: s/p  flu shot; no COVID- RSV shot  # DISPOSITION:  # chemo today; udenyca- tomorrow.     # follow up in 3 weeks- MD; labs- cbc/cmp; - ca 125;  CARBO-TAXOL; D-2 [name]- Dr.B

## 2022-01-09 NOTE — Progress Notes (Signed)
Patient has no concerns today. 

## 2022-01-10 ENCOUNTER — Inpatient Hospital Stay: Payer: PPO

## 2022-01-10 DIAGNOSIS — Z5111 Encounter for antineoplastic chemotherapy: Secondary | ICD-10-CM | POA: Diagnosis not present

## 2022-01-10 DIAGNOSIS — C562 Malignant neoplasm of left ovary: Secondary | ICD-10-CM

## 2022-01-10 LAB — CA 125: Cancer Antigen (CA) 125: 10.3 U/mL (ref 0.0–38.1)

## 2022-01-10 MED ORDER — PEGFILGRASTIM-CBQV 6 MG/0.6ML ~~LOC~~ SOSY
6.0000 mg | PREFILLED_SYRINGE | Freq: Once | SUBCUTANEOUS | Status: AC
  Administered 2022-01-10: 6 mg via SUBCUTANEOUS
  Filled 2022-01-10: qty 0.6

## 2022-01-27 MED FILL — Fosaprepitant Dimeglumine For IV Infusion 150 MG (Base Eq): INTRAVENOUS | Qty: 5 | Status: AC

## 2022-01-27 MED FILL — Dexamethasone Sodium Phosphate Inj 100 MG/10ML: INTRAMUSCULAR | Qty: 1 | Status: AC

## 2022-01-30 ENCOUNTER — Encounter: Payer: Self-pay | Admitting: Internal Medicine

## 2022-01-30 ENCOUNTER — Inpatient Hospital Stay (HOSPITAL_BASED_OUTPATIENT_CLINIC_OR_DEPARTMENT_OTHER): Payer: PPO | Admitting: Nurse Practitioner

## 2022-01-30 ENCOUNTER — Inpatient Hospital Stay: Payer: PPO

## 2022-01-30 ENCOUNTER — Encounter: Payer: Self-pay | Admitting: Nurse Practitioner

## 2022-01-30 ENCOUNTER — Inpatient Hospital Stay: Payer: PPO | Attending: Obstetrics and Gynecology

## 2022-01-30 VITALS — BP 133/68 | HR 80 | Temp 97.7°F | Resp 18 | Wt 174.0 lb

## 2022-01-30 DIAGNOSIS — G620 Drug-induced polyneuropathy: Secondary | ICD-10-CM | POA: Insufficient documentation

## 2022-01-30 DIAGNOSIS — Z5111 Encounter for antineoplastic chemotherapy: Secondary | ICD-10-CM

## 2022-01-30 DIAGNOSIS — Z79899 Other long term (current) drug therapy: Secondary | ICD-10-CM | POA: Insufficient documentation

## 2022-01-30 DIAGNOSIS — Z5189 Encounter for other specified aftercare: Secondary | ICD-10-CM | POA: Insufficient documentation

## 2022-01-30 DIAGNOSIS — I471 Supraventricular tachycardia, unspecified: Secondary | ICD-10-CM | POA: Insufficient documentation

## 2022-01-30 DIAGNOSIS — Z90722 Acquired absence of ovaries, bilateral: Secondary | ICD-10-CM | POA: Insufficient documentation

## 2022-01-30 DIAGNOSIS — Z9079 Acquired absence of other genital organ(s): Secondary | ICD-10-CM | POA: Diagnosis not present

## 2022-01-30 DIAGNOSIS — C562 Malignant neoplasm of left ovary: Secondary | ICD-10-CM | POA: Insufficient documentation

## 2022-01-30 DIAGNOSIS — Z87891 Personal history of nicotine dependence: Secondary | ICD-10-CM | POA: Insufficient documentation

## 2022-01-30 DIAGNOSIS — Z90710 Acquired absence of both cervix and uterus: Secondary | ICD-10-CM | POA: Diagnosis not present

## 2022-01-30 DIAGNOSIS — T451X5A Adverse effect of antineoplastic and immunosuppressive drugs, initial encounter: Secondary | ICD-10-CM | POA: Insufficient documentation

## 2022-01-30 LAB — CBC WITH DIFFERENTIAL/PLATELET
Abs Immature Granulocytes: 0.04 10*3/uL (ref 0.00–0.07)
Basophils Absolute: 0 10*3/uL (ref 0.0–0.1)
Basophils Relative: 1 %
Eosinophils Absolute: 0.1 10*3/uL (ref 0.0–0.5)
Eosinophils Relative: 1 %
HCT: 34.9 % — ABNORMAL LOW (ref 36.0–46.0)
Hemoglobin: 11.7 g/dL — ABNORMAL LOW (ref 12.0–15.0)
Immature Granulocytes: 1 %
Lymphocytes Relative: 45 %
Lymphs Abs: 3.7 10*3/uL (ref 0.7–4.0)
MCH: 34.1 pg — ABNORMAL HIGH (ref 26.0–34.0)
MCHC: 33.5 g/dL (ref 30.0–36.0)
MCV: 101.7 fL — ABNORMAL HIGH (ref 80.0–100.0)
Monocytes Absolute: 0.4 10*3/uL (ref 0.1–1.0)
Monocytes Relative: 5 %
Neutro Abs: 3.9 10*3/uL (ref 1.7–7.7)
Neutrophils Relative %: 47 %
Platelets: 180 10*3/uL (ref 150–400)
RBC: 3.43 MIL/uL — ABNORMAL LOW (ref 3.87–5.11)
RDW: 17 % — ABNORMAL HIGH (ref 11.5–15.5)
WBC: 8.2 10*3/uL (ref 4.0–10.5)
nRBC: 0 % (ref 0.0–0.2)

## 2022-01-30 LAB — COMPREHENSIVE METABOLIC PANEL
ALT: 16 U/L (ref 0–44)
AST: 18 U/L (ref 15–41)
Albumin: 3.9 g/dL (ref 3.5–5.0)
Alkaline Phosphatase: 88 U/L (ref 38–126)
Anion gap: 10 (ref 5–15)
BUN: 14 mg/dL (ref 8–23)
CO2: 26 mmol/L (ref 22–32)
Calcium: 8.8 mg/dL — ABNORMAL LOW (ref 8.9–10.3)
Chloride: 102 mmol/L (ref 98–111)
Creatinine, Ser: 0.79 mg/dL (ref 0.44–1.00)
GFR, Estimated: >60 mL/min (ref >60)
Glucose, Bld: 143 mg/dL — ABNORMAL HIGH (ref 70–99)
Potassium: 4 mmol/L (ref 3.5–5.1)
Sodium: 138 mmol/L (ref 135–145)
Total Bilirubin: 0.5 mg/dL (ref 0.3–1.2)
Total Protein: 7 g/dL (ref 6.5–8.1)

## 2022-01-30 MED ORDER — SODIUM CHLORIDE 0.9 % IV SOLN
Freq: Once | INTRAVENOUS | Status: AC
  Filled 2022-01-30: qty 250

## 2022-01-30 MED ORDER — FAMOTIDINE IN NACL 20-0.9 MG/50ML-% IV SOLN
20.0000 mg | Freq: Once | INTRAVENOUS | Status: AC
  Administered 2022-01-30: 20 mg via INTRAVENOUS
  Filled 2022-01-30: qty 50

## 2022-01-30 MED ORDER — DIPHENHYDRAMINE HCL 50 MG/ML IJ SOLN
50.0000 mg | Freq: Once | INTRAMUSCULAR | Status: AC
  Administered 2022-01-30: 50 mg via INTRAVENOUS
  Filled 2022-01-30: qty 1

## 2022-01-30 MED ORDER — SODIUM CHLORIDE 0.9 % IV SOLN
10.0000 mg | Freq: Once | INTRAVENOUS | Status: AC
  Administered 2022-01-30: 10 mg via INTRAVENOUS
  Filled 2022-01-30: qty 10

## 2022-01-30 MED ORDER — PALONOSETRON HCL INJECTION 0.25 MG/5ML
0.2500 mg | Freq: Once | INTRAVENOUS | Status: AC
  Administered 2022-01-30: 0.25 mg via INTRAVENOUS
  Filled 2022-01-30: qty 5

## 2022-01-30 MED ORDER — SODIUM CHLORIDE 0.9 % IV SOLN
140.0000 mg/m2 | Freq: Once | INTRAVENOUS | Status: AC
  Administered 2022-01-30: 264 mg via INTRAVENOUS
  Filled 2022-01-30: qty 44

## 2022-01-30 MED ORDER — SODIUM CHLORIDE 0.9 % IV SOLN
425.5000 mg | Freq: Once | INTRAVENOUS | Status: AC
  Administered 2022-01-30: 430 mg via INTRAVENOUS
  Filled 2022-01-30: qty 43

## 2022-01-30 MED ORDER — SODIUM CHLORIDE 0.9 % IV SOLN
150.0000 mg | Freq: Once | INTRAVENOUS | Status: AC
  Administered 2022-01-30: 150 mg via INTRAVENOUS
  Filled 2022-01-30: qty 150

## 2022-01-30 NOTE — Progress Notes (Signed)
Patient here today for follow up and treatment consideration regarding ovarian cancer. Patient reports new onset of neuropathy to fingertips over the past 2 weeks. Patient denies any other concerns today.

## 2022-01-30 NOTE — Progress Notes (Signed)
[place] NOTE  Patient Care Team: [name], PA-C as PCP - General (Family Medicine) End, Harrell Gave, MD as PCP - Cardiology (Cardiology) Ralene Bathe, MD (Dermatology) Arelia Sneddon, [place] (Optometry) Vladimir Crofts, MD as Consulting Physician (Neurology) Beverly Gust, MD (Otolaryngology) Clent Jacks, RN as Oncology Nurse Navigator  CHIEF COMPLAINTS/PURPOSE OF CONSULTATION: Ovarian cancer  Oncology History Overview Note  s/p prior hysterectomy (ovaries in situ); Dr. Matilde Sprang for ovarian mass.   incontinence.MRI 08/04/21- Reproductive: Large cystic and solid pelvic mass centered in the pelvis. Ovarian tissue or ovary is not demonstrated on the current study. Mass in total measuring 15 x 11 x 12 cm. Soft tissue component showing restricted diffusion and enhancement centered in the lower portion of the mass shows heterogeneous T2 signal and measures 10.8 x 6.8 x 8.5 cm amidst cystic areas. There is a small amount of associated ascites. The mass abuts the vaginal apex following hysterectomy, potentially involving the LEFT vaginal apex. The "soft tissue component displays very heterogeneous enhancement and there are numerous enhancing septations throughout. Collateral pathways from LEFT ovarian vein are noted about the LEFT lateral aspect of the mass. Given that the gonadal vein can be traced to the mass suspicion is for mass of LEFT ovarian origin though this may of parasitized flow from ovarian. 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 Theora Gianotti; pelvic washings negative]   # AUG 30th, 2023- Carbo-taxol; with GCSF   # PSVT [Dr.Gollan] on metoprolol   Ovarian cancer on left ([place])  09/28/2021 Initial Diagnosis   Ovarian cancer on left ([place])   09/28/2021 Cancer Staging   Staging form:  Ovary, Fallopian Tube, and Primary Peritoneal Carcinoma, AJCC 8th Edition - Clinical: Stage IC (cT1c1, cN0, cM0) - Signed by Cammie Sickle, MD on 09/28/2021   10/13/2021 -  Chemotherapy   Patient is on Treatment Plan : OVARIAN Carboplatin (AUC 6) + Paclitaxel (175) q21d X 6 Cycles      Genetic Testing   Negative genetic testing. No pathogenic variants identified on the [facility] CancerNext-Expanded+RNA panel. The report date is 10/31/2021. HRD testing through [facility] was also negative and reported out 10/31/2021.  The Multi-Cancer Panel + RNA offered by [facility] includes sequencing and/or deletion duplication testing of the following 84 genes: AIP, ALK, APC, ATM, AXIN2,BAP1,  BARD1, BLM, BMPR1A, BRCA1, BRCA2, BRIP1, CASR, CDC73, CDH1, CDK4, CDKN1B, CDKN1C, CDKN2A (p14ARF), CDKN2A (p16INK4a), CEBPA, CHEK2, CTNNA1, DICER1, DIS3L2, EGFR (c.2369C>T, p.Thr790Met variant only), EPCAM (Deletion/duplication testing only), FH, FLCN, GATA2, GPC3, GREM1 (Promoter region deletion/duplication testing only), HOXB13 (c.251G>A, p.Gly84Glu), HRAS, KIT, MAX, MEN1, MET, MITF (c.952G>A, p.Glu318Lys variant only), MLH1, MSH2, MSH3, MSH6, MUTYH, NBN, NF1, NF2, NTHL1, PALB2, PDGFRA, PHOX2B, PMS2, POLD1, POLE, POT1, PRKAR1A, PTCH1, PTEN, RAD50, RAD51C, RAD51D, RB1, RECQL4, RET, RUNX1, SDHAF2, SDHA (sequence changes only), SDHB, SDHC, SDHD, SMAD4, SMARCA4, SMARCB1, SMARCE1, STK11, SUFU, TERC, TERT, TMEM127, TP53, TSC1, TSC2, VHL, WRN and WT1.     HISTORY OF PRESENTING ILLNESS: Ambulating independently.  Alone.   [name] 76 y.o.  female with high-grade serous ovarian cancer stage Ic currently on adjuvant chemotherapy-CarboTaxol is here for follow-up and consideration of chemotherapy. She is excited to be on her 6th cycle. She complains of numbness and tingling of the fingertips. Impairing ability to pick up objects. Persistent. Otherwise feels wel and denies complaints.  Review of Systems  Constitutional:   Negative for chills, diaphoresis, fever, malaise/fatigue and weight loss.  HENT:  Negative for nosebleeds and sore throat.   Eyes:  Negative for double vision.  Respiratory:  Negative for cough, hemoptysis, sputum production, shortness of breath and wheezing.   Cardiovascular:  Negative for chest pain, palpitations, orthopnea and leg swelling.  Gastrointestinal:  Negative for abdominal pain, blood in stool, constipation, diarrhea, heartburn, melena, nausea and vomiting.  Genitourinary:  Negative for dysuria, frequency and urgency.  Musculoskeletal:  Negative for back pain and joint pain.  Skin:  Negative for itching and rash.  Neurological:  Positive for tingling. Negative for dizziness, focal weakness, weakness and headaches.  Endo/Heme/Allergies:  Does not bruise/bleed easily.  Psychiatric/Behavioral:  Negative for depression. The patient is not nervous/anxious and does not have insomnia.     MEDICAL HISTORY:  Past Medical History:  Diagnosis Date   Allergy    Arthritis    Asthma    Atypical chest pain 03/14/2019   Atypical mole 06/12/2018   right prox lat thigh/mod   Atypical mole 05/19/2014   left sup med buttock/excision   Atypical mole 03/26/2014   right sup lat buttock   Atypical mole 03/26/2013   left lat mid back   Atypical mole 11/08/2006   right shoulder superior/excision   Basal cell carcinoma 06/12/2016   right medial sup canthus below eyebrow   Basal cell carcinoma 09/23/2014   left nasal bridge   Basal cell carcinoma 03/26/2013   left nasal sidewall   Basal cell carcinoma 08/27/2012   left nasal dorsum   Cancer (HCC)    shoulder, nose skin CA- basal cell CA   GERD (gastroesophageal reflux disease)    occasional   Hyperlipidemia    Osteopenia    Polyp of transverse colon    Right arm weakness 03/14/2019   Stress due to illness of family member 05/29/2018    SURGICAL HISTORY: Past Surgical History:  Procedure Laterality Date   BREAST BIOPSY Right     core bx- neg   COLONOSCOPY     COLONOSCOPY WITH PROPOFOL N/A 10/08/2018   Procedure: COLONOSCOPY WITH PROPOFOL;  Surgeon: Lucilla Lame, MD;  Location: [facility] ENDOSCOPY;  Service: Endoscopy;  Laterality: N/A;   MOHS SURGERY     TONSILLECTOMY     VAGINAL HYSTERECTOMY      SOCIAL HISTORY: Social History   Socioeconomic History   Marital status: Married    Spouse name: [name]   Number of children: 2   Years of education: Not on file   Highest education level: Bachelor's degree (e.g., BA, AB, BS)  Occupational History   Occupation: Retired  Tobacco Use   Smoking status: Former    Packs/day: 1.50    Years: 20.00    Total pack years: 30.00    Types: Cigarettes    Quit date: 1993    Years since quitting: 30.9    Passive exposure: Past   Smokeless tobacco: Never  Vaping Use   Vaping Use: Never used  Substance and Sexual Activity   Alcohol use: Yes    Alcohol/week: 1.0 standard drink of alcohol    Types: 1 Glasses of wine per week    Comment: one glass of wine once a month   Drug use: Never   Sexual activity: Not Currently  Other Topics Concern   Not on file  Social History Narrative   Lives with husband in [place]; quit smoking > 30 years; once every 1-2 months; retd.  Teacher- elementary school;    Social Determinants of Health   Financial Resource Strain: Low Risk  (03/03/2021)   Overall Financial Resource Strain (CARDIA)    Difficulty of Paying Living Expenses: Not hard at all  Food Insecurity: No Food Insecurity (03/03/2021)   Hunger Vital Sign    Worried About Running Out of Food in the Last Year: Never true    Ran Out of Food in the Last Year: Never true  Transportation Needs: No Transportation Needs (03/03/2021)   PRAPARE - Hydrologist (Medical): No    Lack of Transportation (Non-Medical): No  Physical Activity: Sufficiently Active (03/03/2021)   Exercise Vital Sign    Days of Exercise per Week: 7 days    Minutes of Exercise per Session:  30 min  Stress: No Stress Concern Present (03/03/2021)   [place]    Feeling of Stress : Not at all  Social Connections: Moderately Integrated (03/03/2021)   Social Connection and Isolation Panel [NHANES]    Frequency of Communication with Friends and Family: More than three times a week    Frequency of Social Gatherings with Friends and Family: Three times a week    Attends Religious Services: More than 4 times per year    Active Member of Clubs or Organizations: No    Attends Archivist Meetings: Never    Marital Status: Married  Human resources officer Violence: Not At Risk (03/03/2021)   Humiliation, Afraid, Rape, and Kick questionnaire    Fear of Current or Ex-Partner: No    Emotionally Abused: No    Physically Abused: No    Sexually Abused: No    FAMILY HISTORY: Family History  Problem Relation Age of Onset   Lung cancer Mother    Diabetes Father    Parkinson's disease Father    Alzheimer's disease Father    Heart attack Father 61   Alcohol abuse Sister    Heart disease Sister    Lung cancer Sister 36   Down syndrome Sister    Colon cancer Paternal Uncle        d. 52s   Melanoma Maternal Grandmother    Stroke Paternal Grandmother    Lung cancer Paternal Grandmother        d. 53s   Stomach cancer Paternal Grandfather        d. 21s   Liver cancer Son 61   Esophageal cancer Neg Hx    Breast cancer Neg Hx     ALLERGIES:  is allergic to tetanus toxoids and sulfa antibiotics.  MEDICATIONS:  Current Outpatient Medications  Medication Sig Dispense Refill   acetaminophen (TYLENOL) 325 MG tablet Take 650 mg by mouth every 6 (six) hours as needed.     Ascorbic Acid (VITAMIN C) 1000 MG tablet Take 1,000 mg by mouth daily.     calcium carbonate (OS-CAL - DOSED IN MG OF ELEMENTAL CALCIUM) 1250 (500 Ca) MG tablet Take 1 tablet by mouth daily with breakfast.     Cranberry 50 MG CHEW Chew by mouth.      escitalopram (LEXAPRO) 20 MG tablet Take 1 tablet (20 mg total) by mouth daily. 90 tablet 3   famotidine (PEPCID) 10 MG tablet Take 1 tablet (10 mg total) by mouth 2 (two) times daily as needed for heartburn or indigestion.     hydrOXYzine (ATARAX/VISTARIL) 25 MG tablet TAKE 1-2 TABLETS (25-50 MG TOTAL) BY MOUTH AT BEDTIME AS NEEDED FOR  ANXIETY (INSOMNIA). 180 tablet 2   lovastatin (MEVACOR) 20 MG tablet TAKE 1 TABLET BY MOUTH EVERYDAY AT BEDTIME 90 tablet 3   melatonin 3 MG TABS tablet Take 3 mg by mouth at bedtime.     metoprolol succinate (TOPROL-XL) 25 MG 24 hr tablet Take by mouth.     vitamin B-12 (CYANOCOBALAMIN) 1000 MCG tablet Take 1,000 mcg by mouth daily.     LORazepam (ATIVAN) 0.5 MG tablet Take 1 tablet (0.5 mg total) by mouth every 6 (six) hours as needed for anxiety. (Patient not taking: Reported on 01/30/2022) 3 tablet 0   ondansetron (ZOFRAN) 8 MG tablet One pill every 8 hours as needed for nausea/vomitting. (Patient not taking: Reported on 10/20/2021) 40 tablet 1   prochlorperazine (COMPAZINE) 10 MG tablet Take 1 tablet (10 mg total) by mouth every 6 (six) hours as needed for nausea or vomiting. (Patient not taking: Reported on 10/20/2021) 40 tablet 1   No current facility-administered medications for this visit.    PHYSICAL EXAMINATION: ECOG PERFORMANCE STATUS: 1 - Symptomatic but completely ambulatory  Vitals:   01/30/22 0906  BP: 133/68  Pulse: 80  Resp: 18  Temp: 97.7 F (36.5 C)  SpO2: 98%   Filed Weights   01/30/22 0906  Weight: 174 lb (78.9 kg)    Physical Exam Vitals and nursing note reviewed.  HENT:     Head: Normocephalic and atraumatic.     Mouth/Throat:     Pharynx: Oropharynx is clear.  Eyes:     Extraocular Movements: Extraocular movements intact.     Pupils: Pupils are equal, round, and reactive to light.  Cardiovascular:     Rate and Rhythm: Normal rate and regular rhythm.  Pulmonary:     Comments: Decreased breath sounds bilaterally.   Abdominal:     Palpations: Abdomen is soft.  Musculoskeletal:        General: Normal range of motion.     Cervical back: Normal range of motion.  Skin:    General: Skin is warm.  Neurological:     General: No focal deficit present.     Mental Status: She is alert and oriented to person, place, and time.  Psychiatric:        Behavior: Behavior normal.        Judgment: Judgment normal.     LABORATORY DATA:  I have reviewed the data as listed Lab Results  Component Value Date   WBC 8.2 01/30/2022   HGB 11.7 (L) 01/30/2022   HCT 34.9 (L) 01/30/2022   MCV 101.7 (H) 01/30/2022   PLT 180 01/30/2022   Recent Labs    12/15/21 0809 01/09/22 0835 01/30/22 0833  NA 139 137 138  K 4.1 3.9 4.0  CL 105 105 102  CO2 _0 GLUCOSE 146* 146* 143*  BUN _1 CREATININE 0.85 0.75 0.79  CALCIUM 8.9 8.8* 8.8*  GFRNONAA >60 >60 >60  PROT 6.7 6.5 7.0  ALBUMIN 4.1 3.7 3.9  AST _2 ALT _3 ALKPHOS 88 75 88  BILITOT 0.3 0.7 0.5     RADIOGRAPHIC STUDIES: I have personally reviewed the radiological images as listed and agreed with the findings in the report. No results found.   No problem-specific Assessment & Plan notes found for this encounter.  Ovarian cancer on left [facility]) #High-grade serous ovarian cancer-left side-pT1c [stage Ic-spillage during surgery]. Adjuvant chemotherapy with CarboTaxol every 3 weeks x 6 cycles.  Treatment given with  curative intent. Labs reviewed and acceptable for continuation of treatment. Proceed with cycle 6 of carbo-taxol today. This will be her final treatment then we will re-image. CA 125 has been followed and most recently normalized to 10.3 (was 526 at diagnosis). Will refer to survivorship clinic for care plan in 3 months. Reviewed surveillance guidelines briefly. Plan for ct c/a/p in 3 months. Continue to follow ca 125. Follow up with Dr. Rogue Bussing for results.   # Peripheral neuropathy- moderate; persistent. Will dose reduce  paclitaxel by 20%--> 155m/m2.    # Mild hypocalcemia - calcium 8.21 Oct 2021- Vit 25-OH: 42. Continue calcium 600; add 2000 units of vitamin D.    # Post BF- 143 non-diabetic- Monitor. Dietary modifications.    # IV access: PIV.    # Genetics: s/p genetics evaluation [OCT 25681] VUS; HRD testing also reported out negative on 10/31/2021.    # Vaccination: s/p flu shot. No Covid. RSV shot.   # PAIN CONTROL   # CLINICAL TRIALS: follow up with gyn onc   DISPOSITION: 3 mo- ct c/a/p Follow up with Dr BRogue Bussingwith labs (ca 125, cbc, cmp).   Thank you Dr. for allowing me to participate in the care of your pleasant patient. Please do not hesitate to contact me with questions or concerns in the interim.  Above plan of care was discussed with patient/family in detail.  My contact information was given to the patient/family.    LVerlon Au NP 01/30/2022

## 2022-01-30 NOTE — Patient Instructions (Signed)
[facility]  Discharge Instructions: Thank you for choosing [facility] to provide your oncology and hematology care.  If you have a lab appointment with the Cancer Center, please go directly to the Cancer Center and check in at the registration area.  Wear comfortable clothing and clothing appropriate for easy access to any Portacath or PICC line.   We strive to give you quality time with your provider. You may need to reschedule your appointment if you arrive late (15 or more minutes).  Arriving late affects you and other patients whose appointments are after yours.  Also, if you miss three or more appointments without notifying the office, you may be dismissed from the clinic at the provider's discretion.      For prescription refill requests, have your pharmacy contact our office and allow 72 hours for refills to be completed.    Today you received the following chemotherapy and/or immunotherapy agents Taxol, Paraplatin      To help prevent nausea and vomiting after your treatment, we encourage you to take your nausea medication as directed.  BELOW ARE SYMPTOMS THAT SHOULD BE REPORTED IMMEDIATELY: *FEVER GREATER THAN 100.4 F (38 C) OR HIGHER *CHILLS OR SWEATING *NAUSEA AND VOMITING THAT IS NOT CONTROLLED WITH YOUR NAUSEA MEDICATION *UNUSUAL SHORTNESS OF BREATH *UNUSUAL BRUISING OR BLEEDING *URINARY PROBLEMS (pain or burning when urinating, or frequent urination) *BOWEL PROBLEMS (unusual diarrhea, constipation, pain near the anus) TENDERNESS IN MOUTH AND THROAT WITH OR WITHOUT PRESENCE OF ULCERS (sore throat, sores in mouth, or a toothache) UNUSUAL RASH, SWELLING OR PAIN  UNUSUAL VAGINAL DISCHARGE OR ITCHING   Items with * indicate a potential emergency and should be followed up as soon as possible or go to the Emergency Department if any problems should occur.  Please show the CHEMOTHERAPY ALERT CARD or IMMUNOTHERAPY ALERT CARD at  check-in to the Emergency Department and triage nurse.  Should you have questions after your visit or need to cancel or reschedule your appointment, please contact [facility]  [phone] and follow the prompts.  Office hours are 8:00 a.m. to 4:30 p.m. Monday - Friday. Please note that voicemails left after 4:00 p.m. may not be returned until the following business day.  We are closed weekends and major holidays. You have access to a nurse at all times for urgent questions. Please call the main number to the clinic [phone] and follow the prompts.  For any non-urgent questions, you may also contact your provider using MyChart. We now offer e-Visits for anyone 18 and older to request care online for non-urgent symptoms. For details visit [email or web address].   Also download the MyChart app! Go to the app store, search "MyChart", open the app, select [place], and log in with your MyChart username and password.  Masks are optional in the cancer centers. If you would like for your care team to wear a mask while they are taking care of you, please let them know. For doctor visits, patients may have with them one support person who is at least 76 years old. At this time, visitors are not allowed in the infusion area.   

## 2022-01-31 ENCOUNTER — Inpatient Hospital Stay: Payer: PPO

## 2022-01-31 DIAGNOSIS — C562 Malignant neoplasm of left ovary: Secondary | ICD-10-CM

## 2022-01-31 DIAGNOSIS — Z5111 Encounter for antineoplastic chemotherapy: Secondary | ICD-10-CM | POA: Diagnosis not present

## 2022-01-31 LAB — CA 125: Cancer Antigen (CA) 125: 10.6 U/mL (ref 0.0–38.1)

## 2022-01-31 MED ORDER — PEGFILGRASTIM-CBQV 6 MG/0.6ML ~~LOC~~ SOSY
6.0000 mg | PREFILLED_SYRINGE | Freq: Once | SUBCUTANEOUS | Status: AC
  Administered 2022-01-31: 6 mg via SUBCUTANEOUS
  Filled 2022-01-31: qty 0.6

## 2022-02-01 ENCOUNTER — Telehealth: Payer: Self-pay | Admitting: *Deleted

## 2022-02-01 NOTE — Telephone Encounter (Signed)
Patient called asking when she can get the RSV vaccine

## 2022-02-01 NOTE — Telephone Encounter (Signed)
Call returned to patient and informed that she can get the vaccine now She will check with her pharmacy to see if they offer it there

## 2022-03-07 ENCOUNTER — Ambulatory Visit: Payer: PPO | Admitting: Physician Assistant

## 2022-03-31 ENCOUNTER — Ambulatory Visit (INDEPENDENT_AMBULATORY_CARE_PROVIDER_SITE_OTHER): Payer: PPO

## 2022-03-31 VITALS — Ht 64.0 in | Wt 174.0 lb

## 2022-03-31 DIAGNOSIS — Z0000 Encounter for general adult medical examination without abnormal findings: Secondary | ICD-10-CM | POA: Diagnosis not present

## 2022-03-31 NOTE — Progress Notes (Signed)
I connected with  [name] on 03/31/22 by a audio enabled telemedicine application and verified that I am speaking with the correct person using two identifiers.  Patient Location: Home  Provider Location: Office/Clinic  I discussed the limitations of evaluation and management by telemedicine. The patient expressed understanding and agreed to proceed.  Subjective:   [name] is a 77 y.o. female who presents for Medicare Annual (Subsequent) preventive examination.  Review of Systems         Objective:    Today's Vitals   03/31/22 1126  Weight: 174 lb (78.9 kg)  Height: 5' 4"$  (1.626 m)   Body mass index is 29.87 kg/m.     03/31/2022   11:38 AM 01/09/2022    8:41 AM 01/04/2022    2:05 PM 12/15/2021    8:34 AM 11/24/2021    9:17 AM 10/20/2021   10:38 AM 10/13/2021    8:13 AM  Advanced Directives  Does Patient Have a Medical Advance Directive? Yes Yes Yes Yes Yes Yes Yes  Type of Corporate treasurer of [place];Living will Living will;Healthcare Power of Attorney Living will;Healthcare Power of [place];Living will [place];Living will [place];Living will  Does patient want to make changes to medical advance directive?   Yes (ED - Information included in AVS) Yes (ED - Information included in AVS)     Would patient like information on creating a medical advance directive?   Yes (ED - Information included in AVS)        Current Medications (verified) Outpatient Encounter Medications as of 03/31/2022  Medication Sig   acetaminophen (TYLENOL) 325 MG tablet Take 650 mg by mouth every 6 (six) hours as needed.   Ascorbic Acid (VITAMIN C) 1000 MG tablet Take 1,000 mg by mouth daily.   calcium carbonate (OS-CAL - DOSED IN MG OF ELEMENTAL CALCIUM) 1250 (500 Ca) MG tablet Take 1 tablet by mouth daily with breakfast.   Cranberry 50 MG CHEW Chew by mouth.   escitalopram (LEXAPRO) 20 MG tablet  Take 1 tablet (20 mg total) by mouth daily.   famotidine (PEPCID) 10 MG tablet Take 1 tablet (10 mg total) by mouth 2 (two) times daily as needed for heartburn or indigestion.   hydrOXYzine (ATARAX/VISTARIL) 25 MG tablet TAKE 1-2 TABLETS (25-50 MG TOTAL) BY MOUTH AT BEDTIME AS NEEDED FOR ANXIETY (INSOMNIA).   lovastatin (MEVACOR) 20 MG tablet TAKE 1 TABLET BY MOUTH EVERYDAY AT BEDTIME   melatonin 3 MG TABS tablet Take 3 mg by mouth at bedtime.   metoprolol succinate (TOPROL-XL) 25 MG 24 hr tablet Take by mouth.   vitamin B-12 (CYANOCOBALAMIN) 1000 MCG tablet Take 1,000 mcg by mouth daily.   LORazepam (ATIVAN) 0.5 MG tablet Take 1 tablet (0.5 mg total) by mouth every 6 (six) hours as needed for anxiety. (Patient not taking: Reported on 01/30/2022)   ondansetron (ZOFRAN) 8 MG tablet One pill every 8 hours as needed for nausea/vomitting. (Patient not taking: Reported on 10/20/2021)   prochlorperazine (COMPAZINE) 10 MG tablet Take 1 tablet (10 mg total) by mouth every 6 (six) hours as needed for nausea or vomiting. (Patient not taking: Reported on 10/20/2021)   No facility-administered encounter medications on file as of 03/31/2022.    Allergies (verified) Tetanus toxoids and Sulfa antibiotics   History: Past Medical History:  Diagnosis Date   Allergy    Arthritis    Asthma    Atypical chest pain  03/14/2019   Atypical mole 06/12/2018   right prox lat thigh/mod   Atypical mole 05/19/2014   left sup med buttock/excision   Atypical mole 03/26/2014   right sup lat buttock   Atypical mole 03/26/2013   left lat mid back   Atypical mole 11/08/2006   right shoulder superior/excision   Basal cell carcinoma 06/12/2016   right medial sup canthus below eyebrow   Basal cell carcinoma 09/23/2014   left nasal bridge   Basal cell carcinoma 03/26/2013   left nasal sidewall   Basal cell carcinoma 08/27/2012   left nasal dorsum   Cancer (HCC)    shoulder, nose skin CA- basal cell CA   GERD  (gastroesophageal reflux disease)    occasional   Hyperlipidemia    Osteopenia    Polyp of transverse colon    Right arm weakness 03/14/2019   Stress due to illness of family member 05/29/2018   Past Surgical History:  Procedure Laterality Date   BREAST BIOPSY Right    core bx- neg   COLONOSCOPY     COLONOSCOPY WITH PROPOFOL N/A 10/08/2018   Procedure: COLONOSCOPY WITH PROPOFOL;  Surgeon: Lucilla Lame, MD;  Location: [facility] ENDOSCOPY;  Service: Endoscopy;  Laterality: N/A;   MOHS SURGERY     TONSILLECTOMY     VAGINAL HYSTERECTOMY     Family History  Problem Relation Age of Onset   Lung cancer Mother    Diabetes Father    Parkinson's disease Father    Alzheimer's disease Father    Heart attack Father 66   Alcohol abuse Sister    Heart disease Sister    Lung cancer Sister 17   Down syndrome Sister    Colon cancer Paternal Uncle        d. 17s   Melanoma Maternal Grandmother    Stroke Paternal Grandmother    Lung cancer Paternal Grandmother        d. 79s   Stomach cancer Paternal Grandfather        d. 77s   Liver cancer Son 21   Esophageal cancer Neg Hx    Breast cancer Neg Hx    Social History   Socioeconomic History   Marital status: Married    Spouse name: [name]   Number of children: 2   Years of education: Not on file   Highest education level: Bachelor's degree (e.g., BA, AB, BS)  Occupational History   Occupation: Retired  Tobacco Use   Smoking status: Former    Packs/day: 1.50    Years: 20.00    Total pack years: 30.00    Types: Cigarettes    Quit date: 1993    Years since quitting: 31.1    Passive exposure: Past   Smokeless tobacco: Never  Vaping Use   Vaping Use: Never used  Substance and Sexual Activity   Alcohol use: Yes    Alcohol/week: 1.0 standard drink of alcohol    Types: 1 Glasses of wine per week    Comment: one glass of wine once a month   Drug use: Never   Sexual activity: Not Currently  Other Topics Concern   Not on file  Social  History Narrative   Lives with husband in [place]; quit smoking > 30 years; once every 1-2 months; retd. Teacher- elementary school;    Social Determinants of Health   Financial Resource Strain: Low Risk  (03/31/2022)   Overall Financial Resource Strain (CARDIA)    Difficulty of Paying Living Expenses: Not hard  at all  Food Insecurity: No Food Insecurity (03/03/2021)   Hunger Vital Sign    Worried About Running Out of Food in the Last Year: Never true    Ran Out of Food in the Last Year: Never true  Transportation Needs: No Transportation Needs (03/31/2022)   PRAPARE - Hydrologist (Medical): No    Lack of Transportation (Non-Medical): No  Physical Activity: Sufficiently Active (03/31/2022)   Exercise Vital Sign    Days of Exercise per Week: 7 days    Minutes of Exercise per Session: 30 min  Stress: No Stress Concern Present (03/31/2022)   [place]    Feeling of Stress : Not at all  Social Connections: Moderately Integrated (03/31/2022)   Social Connection and Isolation Panel [NHANES]    Frequency of Communication with Friends and Family: More than three times a week    Frequency of Social Gatherings with Friends and Family: Three times a week    Attends Religious Services: More than 4 times per year    Active Member of Clubs or Organizations: No    Attends Archivist Meetings: Never    Marital Status: Married    Tobacco Counseling Counseling given: Not Answered   Clinical Intake:  Pre-visit preparation completed: Yes  Pain : No/denies pain     BMI - recorded: 29.87 Nutritional Status: BMI 25 -29 Overweight Nutritional Risks: None Diabetes: No  How often do you need to have someone help you when you read instructions, pamphlets, or other written materials from your doctor or pharmacy?: 1 - Never  Diabetic?no  Interpreter Needed?: No  Information entered by ::  B.Margueritte Guthridge,LPN   Activities of Daily Living    03/31/2022   11:39 AM 08/24/2021    1:13 PM  In your present state of health, do you have any difficulty performing the following activities:  Hearing? 0 0  Vision? 0 0  Difficulty concentrating or making decisions? 1 0  Comment short term memory problems   Walking or climbing stairs? 0 0  Dressing or bathing? 0 0  Doing errands, shopping? 0 0  Preparing Food and eating ? N   Using the Toilet? N   In the past six months, have you accidently leaked urine? N   Do you have problems with loss of bowel control? Y   Comment loose stools after ovarian ca surgery   Managing your Medications? N   Managing your Finances? N   Housekeeping or managing your Housekeeping? N     Patient Care Team: [name], PA-C as PCP - General (Family Medicine) End, Harrell Gave, MD as PCP - Cardiology (Cardiology) Ralene Bathe, MD (Dermatology) Arelia Sneddon, [place] (Optometry) Vladimir Crofts, MD as Consulting Physician (Neurology) Beverly Gust, MD (Otolaryngology) Clent Jacks, RN as Oncology Nurse Navigator  Indicate any recent Medical Services you may have received from other than [facility] providers in the past year (date may be approximate).     Assessment:   This is a routine wellness examination for [place].  Hearing/Vision screen Hearing Screening - Comments:: Adequate hearing Vision Screening - Comments:: Adequate vision;wears readers. [facility]ul  Dietary issues and exercise activities discussed: Current Exercise Habits: Home exercise routine, Type of exercise: walking   Goals Addressed             This Visit's Progress    DIET - INCREASE WATER INTAKE   On track  Recommend to drink at least 6-8 8oz glasses of water per day.       Depression Screen    03/31/2022   11:33 AM 08/24/2021    1:13 PM 08/19/2021    8:31 AM 06/28/2021   11:01 AM 06/15/2021    1:45 PM 05/16/2021    8:10 AM 03/03/2021   11:27 AM  PHQ  2/9 Scores  PHQ - 2 Score 0 4 2 2 2 3 $ 0  PHQ- 9 Score  9 4 7 7 13     $ Fall Risk    03/31/2022   11:29 AM 08/24/2021    1:12 PM 08/19/2021    8:30 AM 06/28/2021   11:01 AM 06/15/2021    1:45 PM  Fall Risk   Falls in the past year? 0 0 1 1 1  $ Number falls in past yr: 0 0 1 1 1  $ Injury with Fall? 0 0 0 0 0  Risk for fall due to : No Fall Risks No Fall Risks Impaired balance/gait;Impaired mobility No Fall Risks No Fall Risks  Follow up Falls prevention discussed;Follow up appointment Falls prevention discussed;Education provided Falls prevention discussed;Education provided Falls prevention discussed Falls prevention discussed    FALL RISK PREVENTION PERTAINING TO THE HOME:  Any stairs in or around the home? Yes  If so, are there any without handrails? Yes  Home free of loose throw rugs in walkways, pet beds, electrical cords, etc? Yes  Adequate lighting in your home to reduce risk of falls? Yes   ASSISTIVE DEVICES UTILIZED TO PREVENT FALLS:  Life alert? No  Use of a cane, walker or w/c? No  Grab bars in the bathroom? Yes  Shower chair or bench in shower? No  Elevated toilet seat or a handicapped toilet? Yes   Cognitive Function:        03/31/2022   11:41 AM 09/27/2018    8:36 AM 09/21/2017    9:17 AM  6CIT Screen  What Year? 0 points 0 points 0 points  What month? 0 points 0 points 0 points  What time? 0 points 0 points 0 points  Count back from 20 0 points 0 points 0 points  Months in reverse 0 points 0 points 0 points  Repeat phrase 2 points 0 points 0 points  Total Score 2 points 0 points 0 points    Immunizations Immunization History  Administered Date(s) Administered   Fluad Quad(high Dose 65+) 11/18/2019   Influenza, High Dose Seasonal PF 11/03/2016, 10/30/2017, 10/17/2018, 11/10/2020   Influenza-Unspecified 10/14/2013, 11/15/2015, 11/03/2016   Moderna Covid-19 Vaccine Bivalent Booster 66yr & up 11/22/2020   PFIZER(Purple Top)SARS-COV-2 Vaccination 03/28/2019,  04/18/2019, 11/24/2019   Pneumococcal Conjugate-13 12/09/2014, 05/31/2016   Pneumococcal Polysaccharide-23 09/21/2017   Respiratory Syncytial Virus Vaccine,Recomb Aduvanted(Arexvy) 02/07/2022   Zoster Recombinat (Shingrix) 06/15/2020    TDAP status: Up to date  Flu Vaccine status: Declined, Education has been provided regarding the importance of this vaccine but patient still declined. Advised may receive this vaccine at local pharmacy or Health Dept. Aware to provide a copy of the vaccination record if obtained from local pharmacy or Health Dept. Verbalized acceptance and understanding. Pt is allergic  Pneumococcal vaccine status: Up to date  Covid-19 vaccine status: Completed vaccines  Qualifies for Shingles Vaccine? Yes   Zostavax completed Yes   Shingrix Completed?: Yes  Screening Tests Health Maintenance  Topic Date Due   DTaP/Tdap/Td (1 - Tdap) Never done   Zoster Vaccines- Shingrix (2 of 2) 08/10/2020  INFLUENZA VACCINE  09/13/2021   COLONOSCOPY (Pts 45-89yr Insurance coverage will need to be confirmed)  10/07/2021   COVID-19 Vaccine (5 - 2023-24 season) 10/14/2021   MAMMOGRAM  07/20/2022   Medicare Annual Wellness (AWV)  04/01/2023   Pneumonia Vaccine 77 Years old  Completed   DEXA SCAN  Completed   Hepatitis C Screening  Completed   HPV VACCINES  Aged Out    Health Maintenance  Health Maintenance Due  Topic Date Due   DTaP/Tdap/Td (1 - Tdap) Never done   Zoster Vaccines- Shingrix (2 of 2) 08/10/2020   INFLUENZA VACCINE  09/13/2021   COLONOSCOPY (Pts 45-467yrInsurance coverage will need to be confirmed)  10/07/2021   COVID-19 Vaccine (5 - 2023-24 season) 10/14/2021    Colorectal cancer screening: No longer required.   Mammogram status: No longer required due to age.  Bone Density status: Completed yes. Results reflect: Bone density results: OSTEOPENIA. Repeat every 5 years.  Lung Cancer Screening: (Low Dose CT Chest recommended if Age 77-80ears, 30  pack-year currently smoking OR have quit w/in 15years.) does not qualify.   Lung Cancer Screening Referral: no  Additional Screening:  Hepatitis C Screening: does not qualify; Completed yes  Vision Screening: Recommended annual ophthalmology exams for early detection of glaucoma and other disorders of the eye. Is the patient up to date with their annual eye exam?  Yes  Who is the provider or what is the name of the office in which the patient attends annual eye exams? [place] is not established with a provider, would they like to be referred to a provider to establish care? No .   Dental Screening: Recommended annual dental exams for proper oral hygiene  Community Resource Referral / Chronic Care Management: CRR required this visit?  No   CCM required this visit?  No      Plan:     I have personally reviewed and noted the following in the patient's chart:   Medical and social history Use of alcohol, tobacco or illicit drugs  Current medications and supplements including opioid prescriptions. Patient is not currently taking opioid prescriptions. Functional ability and status Nutritional status Physical activity Advanced directives List of other physicians Hospitalizations, surgeries, and ER visits in previous 12 months Vitals Screenings to include cognitive, depression, and falls Referrals and appointments  In addition, I have reviewed and discussed with patient certain preventive protocols, quality metrics, and best practice recommendations. A written personalized care plan for preventive services as well as general preventive health recommendations were provided to patient.     BrRoger ShelterLPN   [id number] Nurse Notes: pt states she is doing well. She only reports having some difficulty with short term memory and bowel issues related to cancer surgery in fall of 2023. She has no concerns or questions at this time.

## 2022-03-31 NOTE — Patient Instructions (Addendum)
[name] , Thank you for taking time to come for your Medicare Wellness Visit. I appreciate your ongoing commitment to your health goals. Please review the following plan we discussed and let me know if I can assist you in the future.   These are the goals we discussed:  Goals       "I just need some information on how how I can best deal with my husband's parkinson's" (pt-stated)      [name] (see longitudinal plan of care for additional care plan information)  Current Barriers:  Lacks knowledge of community resource: related to managing her spouses medical issues  Clinical Social Work Clinical Goal(s):  Over the next 90 days, patient will work with SW to obtain community resources and information to assist her in the care for her spouse with Parkinsons disease-completed  Intervention Information on spouses' condition, how to best manage it as a caregiver as well as caregiver support mailed to patient's home  Patient confirmed that she received the resources mailed having no additional questions or concerns at this time Patient verbalized appreciation for resources received  Patient Self Care Activities:  Performs ADL's independently Performs IADL's independently Calls provider office for new concerns or questions Knowledge deficit of care giving support and strategy resources  Please see past updates related to this goal by clicking on the "Past Updates" button in the selected goal        DIET - INCREASE WATER INTAKE      Recommend to drink at least 6-8 8oz glasses of water per day.        This is a list of the screening recommended for you and due dates:  Health Maintenance  Topic Date Due   DTaP/Tdap/Td vaccine (1 - Tdap) Never done   Zoster (Shingles) Vaccine (2 of 2) 08/10/2020   Flu Shot  09/13/2021   Colon Cancer Screening  10/07/2021   COVID-19 Vaccine (5 - 2023-24 season) 10/14/2021   Mammogram  07/20/2022   Medicare Annual Wellness Visit  04/01/2023    Pneumonia Vaccine  Completed   DEXA scan (bone density measurement)  Completed   Hepatitis C Screening: USPSTF Recommendation to screen - Ages 50-79 yo.  Completed   HPV Vaccine  Aged Out    Advanced directives: yes  Conditions/risks identified: none  Next appointment: Follow up in one year for your annual wellness visit 04/05/2023@11$ :30am telephone   Preventive Care 65 Years and Older, Female Preventive care refers to lifestyle choices and visits with your health care provider that can promote health and wellness. What does preventive care include? A yearly physical exam. This is also called an annual well check. Dental exams once or twice a year. Routine eye exams. Ask your health care provider how often you should have your eyes checked. Personal lifestyle choices, including: Daily care of your teeth and gums. Regular physical activity. Eating a healthy diet. Avoiding tobacco and drug use. Limiting alcohol use. Practicing safe sex. Taking low-dose aspirin every day. Taking vitamin and mineral supplements as recommended by your health care provider. What happens during an annual well check? The services and screenings done by your health care provider during your annual well check will depend on your age, overall health, lifestyle risk factors, and family history of disease. Counseling  Your health care provider may ask you questions about your: Alcohol use. Tobacco use. Drug use. Emotional well-being. Home and relationship well-being. Sexual activity. Eating habits. History of falls. Memory and ability to understand (  cognition). Work and work Statistician. Reproductive health. Screening  You may have the following tests or measurements: Height, weight, and BMI. Blood pressure. Lipid and cholesterol levels. These may be checked every 5 years, or more frequently if you are over 60 years old. Skin check. Lung cancer screening. You may have this screening every year  starting at age 74 if you have a 30-pack-year history of smoking and currently smoke or have quit within the past 15 years. Fecal occult blood test (FOBT) of the stool. You may have this test every year starting at age 8. Flexible sigmoidoscopy or colonoscopy. You may have a sigmoidoscopy every 5 years or a colonoscopy every 10 years starting at age 72. Hepatitis C blood test. Hepatitis B blood test. Sexually transmitted disease (STD) testing. Diabetes screening. This is done by checking your blood sugar (glucose) after you have not eaten for a while (fasting). You may have this done every 1-3 years. Bone density scan. This is done to screen for osteoporosis. You may have this done starting at age 67. Mammogram. This may be done every 1-2 years. Talk to your health care provider about how often you should have regular mammograms. Talk with your health care provider about your test results, treatment options, and if necessary, the need for more tests. Vaccines  Your health care provider may recommend certain vaccines, such as: Influenza vaccine. This is recommended every year. Tetanus, diphtheria, and acellular pertussis (Tdap, Td) vaccine. You may need a Td booster every 10 years. Zoster vaccine. You may need this after age 79. Pneumococcal 13-valent conjugate (PCV13) vaccine. One dose is recommended after age 77. Pneumococcal polysaccharide (PPSV23) vaccine. One dose is recommended after age 26. Talk to your health care provider about which screenings and vaccines you need and how often you need them. This information is not intended to replace advice given to you by your health care provider. Make sure you discuss any questions you have with your health care provider. Document Released: 02/26/2015 Document Revised: 10/20/2015 Document Reviewed: 12/01/2014 Elsevier Interactive Patient Education  2017 [employer] [place] Prevention in the Home Falls can cause injuries. They can happen to  people of all ages. There are many things you can do to make your home safe and to help prevent falls. What can I do on the outside of my home? Regularly fix the edges of walkways and driveways and fix any cracks. Remove anything that might make you trip as you walk through a door, such as a raised step or threshold. Trim any bushes or trees on the path to your home. Use bright outdoor lighting. Clear any walking paths of anything that might make someone trip, such as rocks or tools. Regularly check to see if handrails are loose or broken. Make sure that both sides of any steps have handrails. Any raised decks and porches should have guardrails on the edges. Have any leaves, snow, or ice cleared regularly. Use sand or salt on walking paths during winter. Clean up any spills in your garage right away. This includes oil or grease spills. What can I do in the bathroom? Use night lights. Install grab bars by the toilet and in the tub and shower. Do not use towel bars as grab bars. Use non-skid mats or decals in the tub or shower. If you need to sit down in the shower, use a plastic, non-slip stool. Keep the floor dry. Clean up any water that spills on the floor as soon as it happens.  Remove soap buildup in the tub or shower regularly. Attach bath mats securely with double-sided non-slip rug tape. Do not have throw rugs and other things on the floor that can make you trip. What can I do in the bedroom? Use night lights. Make sure that you have a light by your bed that is easy to reach. Do not use any sheets or blankets that are too big for your bed. They should not hang down onto the floor. Have a firm chair that has side arms. You can use this for support while you get dressed. Do not have throw rugs and other things on the floor that can make you trip. What can I do in the kitchen? Clean up any spills right away. Avoid walking on wet floors. Keep items that you use a lot in easy-to-reach  places. If you need to reach something above you, use a strong step stool that has a grab bar. Keep electrical cords out of the way. Do not use floor polish or wax that makes floors slippery. If you must use wax, use non-skid floor wax. Do not have throw rugs and other things on the floor that can make you trip. What can I do with my stairs? Do not leave any items on the stairs. Make sure that there are handrails on both sides of the stairs and use them. Fix handrails that are broken or loose. Make sure that handrails are as long as the stairways. Check any carpeting to make sure that it is firmly attached to the stairs. Fix any carpet that is loose or worn. Avoid having throw rugs at the top or bottom of the stairs. If you do have throw rugs, attach them to the floor with carpet tape. Make sure that you have a light switch at the top of the stairs and the bottom of the stairs. If you do not have them, ask someone to add them for you. What else can I do to help prevent falls? Wear shoes that: Do not have high heels. Have rubber bottoms. Are comfortable and fit you well. Are closed at the toe. Do not wear sandals. If you use a stepladder: Make sure that it is fully opened. Do not climb a closed stepladder. Make sure that both sides of the stepladder are locked into place. Ask someone to hold it for you, if possible. Clearly mark and make sure that you can see: Any grab bars or handrails. First and last steps. Where the edge of each step is. Use tools that help you move around (mobility aids) if they are needed. These include: Canes. Walkers. Scooters. Crutches. Turn on the lights when you go into a dark area. Replace any light bulbs as soon as they burn out. Set up your furniture so you have a clear path. Avoid moving your furniture around. If any of your floors are uneven, fix them. If there are any pets around you, be aware of where they are. Review your medicines with your doctor.  Some medicines can make you feel dizzy. This can increase your chance of falling. Ask your doctor what other things that you can do to help prevent falls. This information is not intended to replace advice given to you by your health care provider. Make sure you discuss any questions you have with your health care provider. Document Released: 11/26/2008 Document Revised: 07/08/2015 Document Reviewed: 03/06/2014 Elsevier Interactive Patient Education  2017 [employer].

## 2022-04-02 ENCOUNTER — Other Ambulatory Visit: Payer: Self-pay

## 2022-04-12 ENCOUNTER — Inpatient Hospital Stay: Payer: PPO | Attending: Obstetrics and Gynecology | Admitting: Nurse Practitioner

## 2022-04-12 VITALS — BP 135/59 | HR 64 | Temp 96.9°F | Resp 20 | Wt 173.5 lb

## 2022-04-12 DIAGNOSIS — Z87891 Personal history of nicotine dependence: Secondary | ICD-10-CM | POA: Insufficient documentation

## 2022-04-12 DIAGNOSIS — Z801 Family history of malignant neoplasm of trachea, bronchus and lung: Secondary | ICD-10-CM | POA: Insufficient documentation

## 2022-04-12 DIAGNOSIS — Z90710 Acquired absence of both cervix and uterus: Secondary | ICD-10-CM | POA: Diagnosis not present

## 2022-04-12 DIAGNOSIS — C562 Malignant neoplasm of left ovary: Secondary | ICD-10-CM

## 2022-04-12 DIAGNOSIS — Z90722 Acquired absence of ovaries, bilateral: Secondary | ICD-10-CM | POA: Insufficient documentation

## 2022-04-12 DIAGNOSIS — Z9079 Acquired absence of other genital organ(s): Secondary | ICD-10-CM | POA: Diagnosis not present

## 2022-04-12 DIAGNOSIS — Z808 Family history of malignant neoplasm of other organs or systems: Secondary | ICD-10-CM | POA: Insufficient documentation

## 2022-04-12 DIAGNOSIS — Z800 Family history of malignant neoplasm of digestive organs: Secondary | ICD-10-CM | POA: Diagnosis not present

## 2022-04-12 NOTE — Progress Notes (Signed)
Gynecologic Oncology Interval Visit   Referring Provider: Dr. Matilde Sprang  Chief Complaint: Ovarian Mass  Subjective:  [name] is a 77 y.o. female s/p prior hysterectomy (ovaries in situ) who is seen in consultation from Dr. Matilde Sprang for ovarian mass. S/p ex-lap, BSO, biopsies, appendectomy, adhesiolysis with Dr. Theora Gianotti at [facility] on 09/08/21. She is s/p 6 of 6 planned cycles of adjuvant chemotherapy with carboplatin and paclitaxel, completed 01/30/22.   She continues to have some alternating constipation and diarrhea since surgery. Intermittent fatigue which is improving. She has imaging planned for next month. Mild numbness of her fingertips, primarily thumb. Feels well otherwise.   Component Ref Range & Units 2 mo ago (01/30/22) 3 mo ago (01/09/22) 3 mo ago (12/15/21) 5 mo ago (11/03/21) 6 mo ago (09/28/21) 8 mo ago (08/10/21)  Cancer Antigen (CA) 125 0.0 - 38.1 U/mL 10.6 10.3 CM 11.5 CM 14.3 CM 39.7 High  CM 526.0 High      Gynecologic Oncologic History [name] is a pleasant female s/p prior hysterectomy (ovaries in situ) who is seen in consultation from Dr. Matilde Sprang for ovarian mass.   Patient presented to Urology for incontinence. She had elevated residual urine and ultrasound was performed which showed 12.2 x 10.1 x 2.9 cm mixed cystic and solid mass suspicious for ovarian mass.   MRI 08/04/21- Reproductive: Large cystic and solid pelvic mass centered in the pelvis. Ovarian tissue or ovary is not demonstrated on the current study. Mass in total measuring 15 x 11 x 12 cm. Soft tissue component showing restricted diffusion and enhancement centered in the lower portion of the mass shows heterogeneous T2 signal and measures 10.8 x 6.8 x 8.5 cm amidst cystic areas. There is a small amount of associated ascites. The mass abuts the vaginal apex following hysterectomy, potentially involving the LEFT vaginal apex. The "soft tissue component displays very heterogeneous enhancement  and there are numerous enhancing septations throughout. Collateral pathways from LEFT ovarian vein are noted about the LEFT lateral aspect of the mass. Given that the gonadal vein can be traced to the mass suspicion is for mass of LEFT ovarian origin though this may of parasitized flow from ovarian. Vessels on the LEFT   Other: Trace ascites. No discrete peritoneal nodularity about the pelvis.   Musculoskeletal: No suspicious bone lesion   IMPRESSION: 1. Large cystic and solid pelvic mass highly suspicious for neoplasm. Tumor may arise from the LEFT ovary based on proximity to LEFT ovarian vein though no defined normal ovarian tissue can be seen on either the RIGHT or the LEFT on today's exam. Correlate with surgical history. Sarcoma is also considered based on the marked heterogeneity seen within the lesion on the current study. There is strong restricted diffusion within and avid enhancement of the large central soft tissue component. Gyn consultation gyn consultation is suggested. 2. Trace ascites. 3. Tumor in close proximity to sigmoid colon without definitive signs of involvement also in close proximity to the urinary bladder with defined plane between urinary bladder and mass. Mass may involve the LEFT vaginal fornix. 4. Small amount of associated ascites.  She has history of hysterectomy at age 77 for endometriosis.   We recommended surgical management.  CA 125 was 526 at diagnosis  09/08/2021  Diagnostic laparoscopy, exploratory laparotomy, bilateral salpingo-oophorectomy, infracolic omentectomy, peritoneal biopsies, appendectomy with partial cecectomy, adhesiolysis (including enterolysis and ovariolysis), and posterior rectus sheath blocks  with Dr Theora Gianotti at [facility].   She has done well since surgery and has  no significant complaints.  She continues on Lovenox.  A. 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 Negative for surface involvement. Bilateral fallopian tubes: Negative for carcinoma.   See synoptic report.   Comment: The tumor has an unusual microcystic morphology, but immunohistochemical studies are most consistent with a high-grade serous carcinoma.   B.  Right ovary, oophorectomy: Right ovary with no pathologic diagnosis.  Negative for carcinoma.   C. Omentum, omentectomy: Benign fibroadipose tissue. Negative for carcinoma.   D. Left paracolic gutter, biopsy: Benign fibroadipose tissue and skeletal muscle. Negative for carcinoma.   E. Left pelvic, biopsy: Benign fibroadipose tissue and smooth muscle. Negative for carcinoma.   F. Anterior cul de sac, biopsy: Benign fibroadipose tissue and smooth muscle. Negative for carcinoma.   G. Right pelvic, biopsy: Benign fibrous tissue and smooth muscle. Negative for carcinoma.   H. Posterior cul de sac, biopsy: Benign fibrous tissue. Negative for carcinoma.   I. Appendix and portion of cecum, appendectomy and partial cecectomy: Appendix and portion of cecum with no pathologic diagnosis. Negative for carcinoma.  Pelvic washings - negative for malignancy   Genetics Assessment: Negative genetic testing. No pathogenic variants identified on the [facility] CancerNext-Expanded+RNA panel. The report date is 10/31/2021. HRD testing through [facility] was also negative and reported out 10/31/2021.    Problem List: Patient Active Problem List   Diagnosis Date Noted   Genetic testing 11/02/2021   Ovarian cancer on left ([place]) 09/28/2021   Depression, unspecified 09/06/2021   Mild intermittent asthma 09/06/2021   Ovarian mass 08/10/2021   PSVT (paroxysmal supraventricular tachycardia) 08/14/2019   Vitamin D deficiency 06/10/2019   Current mild episode of major depressive disorder without prior episode ([place]) 10/15/2018   GAD (generalized anxiety disorder) 10/15/2018   Personal history of  colonic polyps    Obesity (BMI 30.0-34.9) 05/29/2018   GERD (gastroesophageal reflux disease) 11/27/2017   Osteopenia 11/27/2017   Mixed hyperlipidemia 12/09/2014   Insomnia due to stress 12/09/2014    Past Medical History: Past Medical History:  Diagnosis Date   Allergy    Arthritis    Asthma    Atypical chest pain 03/14/2019   Atypical mole 06/12/2018   right prox lat thigh/mod   Atypical mole 05/19/2014   left sup med buttock/excision   Atypical mole 03/26/2014   right sup lat buttock   Atypical mole 03/26/2013   left lat mid back   Atypical mole 11/08/2006   right shoulder superior/excision   Basal cell carcinoma 06/12/2016   right medial sup canthus below eyebrow   Basal cell carcinoma 09/23/2014   left nasal bridge   Basal cell carcinoma 03/26/2013   left nasal sidewall   Basal cell carcinoma 08/27/2012   left nasal dorsum   Cancer (HCC)    shoulder, nose skin CA- basal cell CA   GERD (gastroesophageal reflux disease)    occasional   Hyperlipidemia    Osteopenia    Polyp of transverse colon    Right arm weakness 03/14/2019   Stress due to illness of family member 05/29/2018    Past Surgical History: Past Surgical History:  Procedure Laterality Date   BREAST BIOPSY Right    core bx- neg   COLONOSCOPY     COLONOSCOPY WITH PROPOFOL N/A 10/08/2018   Procedure: COLONOSCOPY WITH PROPOFOL;  Surgeon: Lucilla Lame, MD;  Location: [facility] ENDOSCOPY;  Service: Endoscopy;  Laterality: N/A;   MOHS  SURGERY     TONSILLECTOMY     VAGINAL HYSTERECTOMY     Past Gynecologic History:  Menarche: age 34 Hysterectomy at age 18.   OB History:  OB History  No obstetric history on file.   Family History: Family History  Problem Relation Age of Onset   Lung cancer Mother    Diabetes Father    Parkinson's disease Father    Alzheimer's disease Father    Heart attack Father 81   Alcohol abuse Sister    Heart disease Sister    Lung cancer Sister 49   Down syndrome Sister     Colon cancer Paternal Uncle        d. 19s   Melanoma Maternal Grandmother    Stroke Paternal Grandmother    Lung cancer Paternal Grandmother        d. 28s   Stomach cancer Paternal Grandfather        d. 19s   Liver cancer Son 34   Esophageal cancer Neg Hx    Breast cancer Neg Hx     Social History: Social History   Socioeconomic History   Marital status: Married    Spouse name: [name]   Number of children: 2   Years of education: Not on file   Highest education level: Bachelor's degree (e.g., BA, AB, BS)  Occupational History   Occupation: Retired  Tobacco Use   Smoking status: Former    Packs/day: 1.50    Years: 20.00    Total pack years: 30.00    Types: Cigarettes    Quit date: 1993    Years since quitting: 31.1    Passive exposure: Past   Smokeless tobacco: Never  Vaping Use   Vaping Use: Never used  Substance and Sexual Activity   Alcohol use: Yes    Alcohol/week: 1.0 standard drink of alcohol    Types: 1 Glasses of wine per week    Comment: one glass of wine once a month   Drug use: Never   Sexual activity: Not Currently  Other Topics Concern   Not on file  Social History Narrative   Lives with husband in [place]; quit smoking > 30 years; once every 1-2 months; retd. Teacher- elementary school;    Social Determinants of Health   Financial Resource Strain: Low Risk  (03/31/2022)   Overall Financial Resource Strain (CARDIA)    Difficulty of Paying Living Expenses: Not hard at all  Food Insecurity: No Food Insecurity (03/03/2021)   Hunger Vital Sign    Worried About Running Out of Food in the Last Year: Never true    Ran Out of Food in the Last Year: Never true  Transportation Needs: No Transportation Needs (03/31/2022)   PRAPARE - Hydrologist (Medical): No    Lack of Transportation (Non-Medical): No  Physical Activity: Sufficiently Active (03/31/2022)   Exercise Vital Sign    Days of Exercise per Week: 7 days    Minutes  of Exercise per Session: 30 min  Stress: No Stress Concern Present (03/31/2022)   [place]    Feeling of Stress : Not at all  Social Connections: Moderately Integrated (03/31/2022)   Social Connection and Isolation Panel [NHANES]    Frequency of Communication with Friends and Family: More than three times a week    Frequency of Social Gatherings with Friends and Family: Three times a week    Attends Religious Services: More than  4 times per year    Active Member of Clubs or Organizations: No    Attends Archivist Meetings: Never    Marital Status: Married  Human resources officer Violence: Not At Risk (03/31/2022)   Humiliation, Afraid, Rape, and Kick questionnaire    Fear of Current or Ex-Partner: No    Emotionally Abused: No    Physically Abused: No    Sexually Abused: No    Allergies: Allergies  Allergen Reactions   Tetanus Toxoids Shortness Of Breath   Sulfa Antibiotics     Severe vomiting    Current Medications: Current Outpatient Medications  Medication Sig Dispense Refill   acetaminophen (TYLENOL) 325 MG tablet Take 650 mg by mouth every 6 (six) hours as needed.     Ascorbic Acid (VITAMIN C) 1000 MG tablet Take 1,000 mg by mouth daily.     calcium carbonate (OS-CAL - DOSED IN MG OF ELEMENTAL CALCIUM) 1250 (500 Ca) MG tablet Take 1 tablet by mouth daily with breakfast.     Cranberry 50 MG CHEW Chew by mouth.     escitalopram (LEXAPRO) 20 MG tablet Take 1 tablet (20 mg total) by mouth daily. 90 tablet 3   famotidine (PEPCID) 10 MG tablet Take 1 tablet (10 mg total) by mouth 2 (two) times daily as needed for heartburn or indigestion.     hydrOXYzine (ATARAX/VISTARIL) 25 MG tablet TAKE 1-2 TABLETS (25-50 MG TOTAL) BY MOUTH AT BEDTIME AS NEEDED FOR ANXIETY (INSOMNIA). 180 tablet 2   lovastatin (MEVACOR) 20 MG tablet TAKE 1 TABLET BY MOUTH EVERYDAY AT BEDTIME 90 tablet 3   melatonin 3 MG TABS tablet Take 3 mg  by mouth at bedtime.     metoprolol succinate (TOPROL-XL) 25 MG 24 hr tablet Take by mouth.     vitamin B-12 (CYANOCOBALAMIN) 1000 MCG tablet Take 1,000 mcg by mouth daily.     LORazepam (ATIVAN) 0.5 MG tablet Take 1 tablet (0.5 mg total) by mouth every 6 (six) hours as needed for anxiety. (Patient not taking: Reported on 01/30/2022) 3 tablet 0   ondansetron (ZOFRAN) 8 MG tablet One pill every 8 hours as needed for nausea/vomitting. (Patient not taking: Reported on 10/20/2021) 40 tablet 1   prochlorperazine (COMPAZINE) 10 MG tablet Take 1 tablet (10 mg total) by mouth every 6 (six) hours as needed for nausea or vomiting. (Patient not taking: Reported on 10/20/2021) 40 tablet 1   No current facility-administered medications for this visit.    Review of Systems General:  fatigue Skin: no complaints Eyes: no complaints HEENT: no complaints Breasts: no complaints Pulmonary: no complaints Cardiac: no complaints Gastrointestinal: no complaints Genitourinary/Sexual: no complaints Ob/Gyn: no complaints Musculoskeletal: no complaints Hematology: no complaints Neurologic/Psych: no complaints   Objective:  Physical Examination:  BP (!) 135/59   Pulse 64   Temp (!) 96.9 F (36.1 C)   Resp 20   Wt 173 lb 8 oz (78.7 kg)   LMP  (LMP Unknown)   SpO2 100%   BMI 29.78 kg/m     ECOG Performance Status: 1 - Symptomatic but completely ambulatory  GENERAL: Patient is a well appearing female in no acute distress HEENT:  Sclerae anicteric NODES:  No cervical, supraclavicular, or axillary lymphadenopathy palpated.  LUNGS:  Clear to auscultation bilaterally.  No wheezes or rhonchi. HEART:  Regular rate and rhythm. No murmur appreciated. ABDOMEN:  Soft, nontender.  Positive, normoactive bowel sounds. Well healed surgical scars MSK:  ambulatory EXTREMITIES:  No peripheral edema.   SKIN:  Clear with no obvious rashes or skin changes. NEURO:  Nonfocal. Well oriented.  Appropriate affect.  Pelvic:  Exam Chaperoned by CMA EGBUS: no lesions Vagina: no lesions, minimal yellow stringy discharge at top of vaginal cuff, no bleeding. Cervix, Uterus- surgically absent. BME: smooth, no palpable masses. RV: deferred.   Lab Review:  No labs on site. Labs reviewed per hpi  Radiologic Imaging: No imaging on site.     Assessment:  [name] is a 77 y.o. female stage Ic high grade serous cancer of the ovary s/p diagnostic laparoscopy, ex lap, BSO, infracolic omentectomy, peritoneal biopsies, appendectomy with partial cecectomy, adhesiolysis, and posterior rectus sheath blocks. She had prior hysterectomy. Pathology consistent with high grade serous. Now s/p 6 cycles of adjuvant carboplatin-paclitaxel. CA 125 has normalized and is low at 10.6. Clinically, NED today.   Medical co-morbidities complicating care: prior vaginal hysterectomy; asthma, and prior skin cancers Plan:   Problem List Items Addressed This Visit       Endocrine   Ovarian cancer on left ([place]) - Primary   CA 125 has improved from 526 to 10.6. She has imaging scheduled for next month. No evidence of disease on exam today and clinically doing well. Given that she is stage Ic, no role for maintenance parp. Recommend surveillance if imaging is negative.   She will see Dr Theora Gianotti back in 3 months for surveillance and pelvic exam. Continue follow up with Dr Rogue Bussing as well. Patient prefers female providers for pelvic exam.   The patient's diagnosis, an outline of the further diagnostic and laboratory studies which will be required, the recommendation for surgery, and alternatives were discussed with her and her accompanying family members.  All questions were answered to their satisfaction.  Beckey Rutter, DNP, AGNP-C [place] at [facility] [phone] (clinic)

## 2022-04-14 DIAGNOSIS — H04123 Dry eye syndrome of bilateral lacrimal glands: Secondary | ICD-10-CM | POA: Diagnosis not present

## 2022-04-14 DIAGNOSIS — H2513 Age-related nuclear cataract, bilateral: Secondary | ICD-10-CM | POA: Diagnosis not present

## 2022-04-14 DIAGNOSIS — H40003 Preglaucoma, unspecified, bilateral: Secondary | ICD-10-CM | POA: Diagnosis not present

## 2022-04-14 DIAGNOSIS — H43813 Vitreous degeneration, bilateral: Secondary | ICD-10-CM | POA: Diagnosis not present

## 2022-04-21 ENCOUNTER — Encounter: Payer: Self-pay | Admitting: Nurse Practitioner

## 2022-04-26 ENCOUNTER — Ambulatory Visit
Admission: RE | Admit: 2022-04-26 | Discharge: 2022-04-26 | Disposition: A | Discharge status: Home / Self Care | Payer: PPO | Source: Ambulatory Visit | Attending: Nurse Practitioner | Admitting: Nurse Practitioner

## 2022-04-26 DIAGNOSIS — C562 Malignant neoplasm of left ovary: Secondary | ICD-10-CM | POA: Diagnosis not present

## 2022-04-26 DIAGNOSIS — N289 Disorder of kidney and ureter, unspecified: Secondary | ICD-10-CM | POA: Diagnosis not present

## 2022-04-26 DIAGNOSIS — C569 Malignant neoplasm of unspecified ovary: Secondary | ICD-10-CM | POA: Diagnosis not present

## 2022-04-26 DIAGNOSIS — J432 Centrilobular emphysema: Secondary | ICD-10-CM | POA: Diagnosis not present

## 2022-04-26 MED ORDER — IOHEXOL 300 MG/ML  SOLN
100.0000 mL | Freq: Once | INTRAMUSCULAR | Status: AC | PRN
  Administered 2022-04-26: 100 mL via INTRAVENOUS

## 2022-05-03 ENCOUNTER — Encounter: Payer: Self-pay | Admitting: Internal Medicine

## 2022-05-03 ENCOUNTER — Inpatient Hospital Stay: Payer: PPO | Attending: Obstetrics and Gynecology

## 2022-05-03 ENCOUNTER — Inpatient Hospital Stay (HOSPITAL_BASED_OUTPATIENT_CLINIC_OR_DEPARTMENT_OTHER): Payer: PPO | Admitting: Internal Medicine

## 2022-05-03 DIAGNOSIS — Z87891 Personal history of nicotine dependence: Secondary | ICD-10-CM | POA: Insufficient documentation

## 2022-05-03 DIAGNOSIS — C562 Malignant neoplasm of left ovary: Secondary | ICD-10-CM | POA: Diagnosis not present

## 2022-05-03 DIAGNOSIS — Z801 Family history of malignant neoplasm of trachea, bronchus and lung: Secondary | ICD-10-CM | POA: Diagnosis not present

## 2022-05-03 DIAGNOSIS — Z800 Family history of malignant neoplasm of digestive organs: Secondary | ICD-10-CM | POA: Insufficient documentation

## 2022-05-03 DIAGNOSIS — G629 Polyneuropathy, unspecified: Secondary | ICD-10-CM | POA: Insufficient documentation

## 2022-05-03 DIAGNOSIS — Z90710 Acquired absence of both cervix and uterus: Secondary | ICD-10-CM | POA: Insufficient documentation

## 2022-05-03 DIAGNOSIS — Z808 Family history of malignant neoplasm of other organs or systems: Secondary | ICD-10-CM | POA: Diagnosis not present

## 2022-05-03 LAB — CBC WITH DIFFERENTIAL/PLATELET
Abs Immature Granulocytes: 0.02 10*3/uL (ref 0.00–0.07)
Basophils Absolute: 0.1 10*3/uL (ref 0.0–0.1)
Basophils Relative: 1 %
Eosinophils Absolute: 0.3 10*3/uL (ref 0.0–0.5)
Eosinophils Relative: 4 %
HCT: 40 % (ref 36.0–46.0)
Hemoglobin: 13.3 g/dL (ref 12.0–15.0)
Immature Granulocytes: 0 %
Lymphocytes Relative: 54 %
Lymphs Abs: 5.2 10*3/uL — ABNORMAL HIGH (ref 0.7–4.0)
MCH: 32.5 pg (ref 26.0–34.0)
MCHC: 33.3 g/dL (ref 30.0–36.0)
MCV: 97.8 fL (ref 80.0–100.0)
Monocytes Absolute: 0.3 10*3/uL (ref 0.1–1.0)
Monocytes Relative: 4 %
Neutro Abs: 3.6 10*3/uL (ref 1.7–7.7)
Neutrophils Relative %: 37 %
Platelets: 171 10*3/uL (ref 150–400)
RBC: 4.09 MIL/uL (ref 3.87–5.11)
RDW: 13.2 % (ref 11.5–15.5)
WBC: 9.6 10*3/uL (ref 4.0–10.5)
nRBC: 0 % (ref 0.0–0.2)

## 2022-05-03 LAB — COMPREHENSIVE METABOLIC PANEL
ALT: 19 U/L (ref 0–44)
AST: 23 U/L (ref 15–41)
Albumin: 3.9 g/dL (ref 3.5–5.0)
Alkaline Phosphatase: 67 U/L (ref 38–126)
Anion gap: 9 (ref 5–15)
BUN: 14 mg/dL (ref 8–23)
CO2: 24 mmol/L (ref 22–32)
Calcium: 8.9 mg/dL (ref 8.9–10.3)
Chloride: 106 mmol/L (ref 98–111)
Creatinine, Ser: 0.93 mg/dL (ref 0.44–1.00)
GFR, Estimated: >60 mL/min (ref >60)
Glucose, Bld: 120 mg/dL — ABNORMAL HIGH (ref 70–99)
Potassium: 3.9 mmol/L (ref 3.5–5.1)
Sodium: 139 mmol/L (ref 135–145)
Total Bilirubin: 0.6 mg/dL (ref 0.3–1.2)
Total Protein: 6.5 g/dL (ref 6.5–8.1)

## 2022-05-03 NOTE — Progress Notes (Signed)
Bowel movements will go from constipation to diarrhea int he same day with no use of medication.  Does take OTC anti-diarrheal med if diarrhea happens 2-3 in same day.    Neuropathy in bilateral fingers that is worse in thumbs not improving.

## 2022-05-03 NOTE — Progress Notes (Signed)
Survivorship Care Plan visit completed.  Treatment summary reviewed and given to patient.  ASCO answers booklet reviewed and given to patient.  CARE program and Cancer Transitions discussed with patient along with other resources cancer center offers to patients and caregivers.  Patient verbalized understanding.    

## 2022-05-03 NOTE — Assessment & Plan Note (Addendum)
#  High-grade serous ovarian cancer-left side-pT1c [stage Ic-spillage during surgery]. S/p adjuvant chemotherapy with CarboTaxol every 3 weeks x 6 cycles.  MARCH 2024- No definitive evidence of metastatic disease; Minimal focal vague haziness in the ventral left lower quadrant, status post omentectomy. No discrete nodules.   # I reviewed the imaging with the patient and her husband today.  Will also have imaging reviewed by Dr. Theora Gianotti.   # Peripheral neuropathy-grade 1.  Discussed regarding acupuncture versus monitoring.  Patient wants to monitor for now.   #Mild hypocalcemia calcium 8.22 Oct 2021- Vit 25-OH: 42.  Continue calcium vitamin D stable.  # Post BF- 120 non-diabetic- monitor for now.  Stable.  # IV access: PIV.   # Patient preference also I think since patient is clinically stable, its reasonable for the patient to follow-up with PCP/gynecology oncology can follow-up with [place] as needed.  I have sent message to Erma.   # DISPOSITION: # follow up as needed- Dr.B

## 2022-05-03 NOTE — Progress Notes (Signed)
[facility] NOTE  Patient Care Team: [name], PA-C as PCP - General (Family Medicine) End, Harrell Gave, MD as PCP - Cardiology (Cardiology) Ralene Bathe, MD (Dermatology) Arelia Sneddon, [place] (Optometry) Vladimir Crofts, MD as Consulting Physician (Neurology) Beverly Gust, MD (Otolaryngology) Clent Jacks, RN as Oncology Nurse Navigator Cammie Sickle, MD as Consulting Physician (Internal Medicine) Gillis Ends, MD as Referring Physician (Obstetrics)  CHIEF COMPLAINTS/PURPOSE OF CONSULTATION: Ovarian cancer.   Oncology History Overview Note  s/p prior hysterectomy (ovaries in situ); Dr. Matilde Sprang for ovarian mass.   incontinence.MRI 08/04/21- Reproductive: Large cystic and solid pelvic mass centered in the pelvis. Ovarian tissue or ovary is not demonstrated on the current study. Mass in total measuring 15 x 11 x 12 cm. Soft tissue component showing restricted diffusion and enhancement centered in the lower portion of the mass shows heterogeneous T2 signal and measures 10.8 x 6.8 x 8.5 cm amidst cystic areas. There is a small amount of associated ascites. The mass abuts the vaginal apex following hysterectomy, potentially involving the LEFT vaginal apex. The "soft tissue component displays very heterogeneous enhancement and there are numerous enhancing septations throughout. Collateral pathways from LEFT ovarian vein are noted about the LEFT lateral aspect of the mass. Given that the gonadal vein can be traced to the mass suspicion is for mass of LEFT ovarian origin though this may of parasitized flow from ovarian. 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 Theora Gianotti; pelvic washings negative]   # AUG 30th, 2023- Carbo-taxol; with GCSF   # PSVT [Dr.Gollan] on metoprolol    Ovarian cancer on left ([place])  09/28/2021 Initial Diagnosis   Ovarian cancer on left ([place])   09/28/2021 Cancer Staging   Staging form: Ovary, Fallopian Tube, and Primary Peritoneal Carcinoma, AJCC 8th Edition - Clinical: Stage IC (cT1c1, cN0, cM0) - Signed by Cammie Sickle, MD on 09/28/2021   10/13/2021 -  Chemotherapy   Patient is on Treatment Plan : OVARIAN Carboplatin (AUC 6) + Paclitaxel (175) q21d X 6 Cycles      Genetic Testing   Negative genetic testing. No pathogenic variants identified on the [facility] CancerNext-Expanded+RNA panel. The report date is 10/31/2021. HRD testing through [facility] was also negative and reported out 10/31/2021.  The Multi-Cancer Panel + RNA offered by [facility] includes sequencing and/or deletion duplication testing of the following 84 genes: AIP, ALK, APC, ATM, AXIN2,BAP1,  BARD1, BLM, BMPR1A, BRCA1, BRCA2, BRIP1, CASR, CDC73, CDH1, CDK4, CDKN1B, CDKN1C, CDKN2A (p14ARF), CDKN2A (p16INK4a), CEBPA, CHEK2, CTNNA1, DICER1, DIS3L2, EGFR (c.2369C>T, p.Thr790Met variant only), EPCAM (Deletion/duplication testing only), FH, FLCN, GATA2, GPC3, GREM1 (Promoter region deletion/duplication testing only), HOXB13 (c.251G>A, p.Gly84Glu), HRAS, KIT, MAX, MEN1, MET, MITF (c.952G>A, p.Glu318Lys variant only), MLH1, MSH2, MSH3, MSH6, MUTYH, NBN, NF1, NF2, NTHL1, PALB2, PDGFRA, PHOX2B, PMS2, POLD1, POLE, POT1, PRKAR1A, PTCH1, PTEN, RAD50, RAD51C, RAD51D, RB1, RECQL4, RET, RUNX1, SDHAF2, SDHA (sequence changes only), SDHB, SDHC, SDHD, SMAD4, SMARCA4, SMARCB1, SMARCE1, STK11, SUFU, TERC, TERT, TMEM127, TP53, TSC1, TSC2, VHL, WRN and WT1.    HISTORY OF PRESENTING ILLNESS: Ambulating independently.  Accompanied by husband.  [name] 77 y.o.  female with high-grade serous ovarian cancer stage Ic currently on adjuvant chemotherapy-CarboTaxol is here for follow-up/review results of the CT scan.   In the interim patient was evaluated by gynecology oncology.  Patient  complains of mild upper extremity tingling and numbness; not in significantly worse.  Fatigue overall improving.  NO Nausea or vomiting.  Denies any constipation.  Review of Systems  Constitutional:  Positive for malaise/fatigue. Negative for chills, diaphoresis, fever and weight loss.  HENT:  Negative for nosebleeds and sore throat.   Eyes:  Negative for double vision.  Respiratory:  Negative for cough, hemoptysis, sputum production, shortness of breath and wheezing.   Cardiovascular:  Negative for chest pain, palpitations, orthopnea and leg swelling.  Gastrointestinal:  Negative for abdominal pain, blood in stool, constipation, diarrhea, heartburn, melena, nausea and vomiting.  Genitourinary:  Negative for dysuria, frequency and urgency.  Musculoskeletal:  Negative for back pain and joint pain.  Skin: Negative.  Negative for itching and rash.  Neurological:  Negative for dizziness, tingling, focal weakness, weakness and headaches.  Endo/Heme/Allergies:  Does not bruise/bleed easily.  Psychiatric/Behavioral:  Negative for depression. The patient is not nervous/anxious and does not have insomnia.     MEDICAL HISTORY:  Past Medical History:  Diagnosis Date   Allergy    Arthritis    Asthma    Atypical chest pain 03/14/2019   Atypical mole 06/12/2018   right prox lat thigh/mod   Atypical mole 05/19/2014   left sup med buttock/excision   Atypical mole 03/26/2014   right sup lat buttock   Atypical mole 03/26/2013   left lat mid back   Atypical mole 11/08/2006   right shoulder superior/excision   Basal cell carcinoma 06/12/2016   right medial sup canthus below eyebrow   Basal cell carcinoma 09/23/2014   left nasal bridge   Basal cell carcinoma 03/26/2013   left nasal sidewall   Basal cell carcinoma 08/27/2012   left nasal dorsum   Cancer (HCC)    shoulder, nose skin CA- basal cell CA   GERD (gastroesophageal reflux disease)    occasional   Hyperlipidemia    Osteopenia     Polyp of transverse colon    Right arm weakness 03/14/2019   Stress due to illness of family member 05/29/2018    SURGICAL HISTORY: Past Surgical History:  Procedure Laterality Date   BREAST BIOPSY Right    core bx- neg   COLONOSCOPY     COLONOSCOPY WITH PROPOFOL N/A 10/08/2018   Procedure: COLONOSCOPY WITH PROPOFOL;  Surgeon: Lucilla Lame, MD;  Location: [facility] ENDOSCOPY;  Service: Endoscopy;  Laterality: N/A;   MOHS SURGERY     TONSILLECTOMY     VAGINAL HYSTERECTOMY      SOCIAL HISTORY: Social History   Socioeconomic History   Marital status: Married    Spouse name: [name]   Number of children: 2   Years of education: Not on file   Highest education level: Bachelor's degree (e.g., BA, AB, BS)  Occupational History   Occupation: Retired  Tobacco Use   Smoking status: Former    Packs/day: 1.50    Years: 20.00    Additional pack years: 0.00    Total pack years: 30.00    Types: Cigarettes    Quit date: 1993    Years since quitting: 31.2    Passive exposure: Past   Smokeless tobacco: Never  Vaping Use   Vaping Use: Never used  Substance and Sexual Activity   Alcohol use: Yes    Alcohol/week: 1.0 standard drink of alcohol    Types: 1 Glasses of wine per week    Comment: one glass of wine once a month   Drug use: Never  Sexual activity: Not Currently  Other Topics Concern   Not on file  Social History Narrative   Lives with husband in [place]; quit smoking > 30 years; once every 1-2 months; retd. Teacher- elementary school;    Social Determinants of Health   Financial Resource Strain: Low Risk  (03/31/2022)   Overall Financial Resource Strain (CARDIA)    Difficulty of Paying Living Expenses: Not hard at all  Food Insecurity: No Food Insecurity (03/03/2021)   Hunger Vital Sign    Worried About Running Out of Food in the Last Year: Never true    Ran Out of Food in the Last Year: Never true  Transportation Needs: No Transportation Needs (03/31/2022)   PRAPARE -  Hydrologist (Medical): No    Lack of Transportation (Non-Medical): No  Physical Activity: Sufficiently Active (03/31/2022)   Exercise Vital Sign    Days of Exercise per Week: 7 days    Minutes of Exercise per Session: 30 min  Stress: No Stress Concern Present (03/31/2022)   [place]    Feeling of Stress : Not at all  Social Connections: Moderately Integrated (03/31/2022)   Social Connection and Isolation Panel [NHANES]    Frequency of Communication with Friends and Family: More than three times a week    Frequency of Social Gatherings with Friends and Family: Three times a week    Attends Religious Services: More than 4 times per year    Active Member of Clubs or Organizations: No    Attends Archivist Meetings: Never    Marital Status: Married  Human resources officer Violence: Not At Risk (03/31/2022)   Humiliation, Afraid, Rape, and Kick questionnaire    Fear of Current or Ex-Partner: No    Emotionally Abused: No    Physically Abused: No    Sexually Abused: No    FAMILY HISTORY: Family History  Problem Relation Age of Onset   Lung cancer Mother    Diabetes Father    Parkinson's disease Father    Alzheimer's disease Father    Heart attack Father 2   Alcohol abuse Sister    Heart disease Sister    Lung cancer Sister 73   Down syndrome Sister    Colon cancer Paternal Uncle        d. 46s   Melanoma Maternal Grandmother    Stroke Paternal Grandmother    Lung cancer Paternal Grandmother        d. 48s   Stomach cancer Paternal Grandfather        d. 24s   Liver cancer Son 69   Esophageal cancer Neg Hx    Breast cancer Neg Hx     ALLERGIES:  is allergic to tetanus toxoids and sulfa antibiotics.  MEDICATIONS:  Current Outpatient Medications  Medication Sig Dispense Refill   acetaminophen (TYLENOL) 325 MG tablet Take 650 mg by mouth every 6 (six) hours as needed.      Ascorbic Acid (VITAMIN C) 1000 MG tablet Take 1,000 mg by mouth daily.     calcium carbonate (OS-CAL - DOSED IN MG OF ELEMENTAL CALCIUM) 1250 (500 Ca) MG tablet Take 1 tablet by mouth daily with breakfast.     escitalopram (LEXAPRO) 20 MG tablet Take 1 tablet (20 mg total) by mouth daily. 90 tablet 3   famotidine (PEPCID) 10 MG tablet Take 1 tablet (10 mg total) by mouth 2 (two) times daily as needed for heartburn or  indigestion.     hydrOXYzine (ATARAX/VISTARIL) 25 MG tablet TAKE 1-2 TABLETS (25-50 MG TOTAL) BY MOUTH AT BEDTIME AS NEEDED FOR ANXIETY (INSOMNIA). 180 tablet 2   lovastatin (MEVACOR) 20 MG tablet TAKE 1 TABLET BY MOUTH EVERYDAY AT BEDTIME 90 tablet 3   melatonin 3 MG TABS tablet Take 3 mg by mouth at bedtime.     metoprolol succinate (TOPROL-XL) 25 MG 24 hr tablet Take by mouth.     vitamin B-12 (CYANOCOBALAMIN) 1000 MCG tablet Take 1,000 mcg by mouth daily.     Cranberry 50 MG CHEW Chew by mouth. (Patient not taking: Reported on 05/03/2022)     LORazepam (ATIVAN) 0.5 MG tablet Take 1 tablet (0.5 mg total) by mouth every 6 (six) hours as needed for anxiety. (Patient not taking: Reported on 01/30/2022) 3 tablet 0   ondansetron (ZOFRAN) 8 MG tablet One pill every 8 hours as needed for nausea/vomitting. (Patient not taking: Reported on 10/20/2021) 40 tablet 1   prochlorperazine (COMPAZINE) 10 MG tablet Take 1 tablet (10 mg total) by mouth every 6 (six) hours as needed for nausea or vomiting. (Patient not taking: Reported on 10/20/2021) 40 tablet 1   No current facility-administered medications for this visit.    PHYSICAL EXAMINATION: ECOG PERFORMANCE STATUS: 1 - Symptomatic but completely ambulatory  Vitals:   05/03/22 1000  BP: 112/62  Resp: 18  Temp: 97.6 F (36.4 C)   Filed Weights   05/03/22 1000  Weight: 174 lb 9.6 oz (79.2 kg)    Physical Exam Vitals and nursing note reviewed.  HENT:     Head: Normocephalic and atraumatic.     Mouth/Throat:     Pharynx: Oropharynx is  clear.  Eyes:     Extraocular Movements: Extraocular movements intact.     Pupils: Pupils are equal, round, and reactive to light.  Cardiovascular:     Rate and Rhythm: Normal rate and regular rhythm.  Pulmonary:     Comments: Decreased breath sounds bilaterally.  Abdominal:     Palpations: Abdomen is soft.  Musculoskeletal:        General: Normal range of motion.     Cervical back: Normal range of motion.  Skin:    General: Skin is warm.  Neurological:     General: No focal deficit present.     Mental Status: She is alert and oriented to person, place, and time.  Psychiatric:        Behavior: Behavior normal.        Judgment: Judgment normal.     LABORATORY DATA:  I have reviewed the data as listed Lab Results  Component Value Date   WBC 9.6 05/03/2022   HGB 13.3 05/03/2022   HCT 40.0 05/03/2022   MCV 97.8 05/03/2022   PLT 171 05/03/2022   Recent Labs    01/09/22 0835 01/30/22 0833 05/03/22 1008  NA 137 138 139  K 3.9 4.0 3.9  CL 105 102 106  CO2 26 26 24   GLUCOSE 146* 143* 120*  BUN 14 14 14   CREATININE 0.75 0.79 0.93  CALCIUM 8.8* 8.8* 8.9  GFRNONAA >60 >60 >60  PROT 6.5 7.0 6.5  ALBUMIN 3.7 3.9 3.9  AST 21 18 23   ALT 16 16 19   ALKPHOS 75 88 67  BILITOT 0.7 0.5 0.6    RADIOGRAPHIC STUDIES: I have personally reviewed the radiological images as listed and agreed with the findings in the report. CT CHEST ABDOMEN PELVIS W CONTRAST  Result Date: 04/28/2022 CLINICAL DATA:  Ovarian cancer, finished chemotherapy in December. * Tracking Code: BO * EXAM: CT CHEST, ABDOMEN, AND PELVIS WITH CONTRAST TECHNIQUE: Multidetector CT imaging of the chest, abdomen and pelvis was performed following the standard protocol during bolus administration of intravenous contrast. RADIATION DOSE REDUCTION: This exam was performed according to the departmental dose-optimization program which includes automated exposure control, adjustment of the mA and/or kV according to patient size  and/or use of iterative reconstruction technique. CONTRAST:  119mL OMNIPAQUE IOHEXOL 300 MG/ML  SOLN COMPARISON:  08/11/2021. FINDINGS: CT CHEST FINDINGS Cardiovascular: Atherosclerotic calcification of the aorta. Heart size normal. No pericardial effusion. Mediastinum/Nodes: Low-attenuation lesions in the thyroid measure up to 11 mm on the right. No follow-up recommended. (Ref: J Am Coll Radiol. 2015 Feb;12(2): 143-50).No pathologically enlarged mediastinal, hilar or axillary lymph nodes. Esophagus is grossly unremarkable. Lungs/Pleura: Mild centrilobular emphysema. 3 mm right upper lobe nodule (3/42), unchanged and likely to be benign but attention on follow-up is recommended. No suspicious pulmonary nodules. No pleural fluid. Airway is unremarkable. Musculoskeletal: Degenerative changes in the spine. No worrisome lytic or sclerotic lesions. CT ABDOMEN PELVIS FINDINGS Hepatobiliary: Liver and gallbladder are unremarkable. No biliary ductal dilatation. Pancreas: Negative. Spleen: Negative. Adrenals/Urinary Tract: Adrenal glands are unremarkable. Low-attenuation lesion in the right kidney. No specific follow-up necessary. Kidneys are otherwise unremarkable. Ureters are decompressed. Bladder is grossly unremarkable. Stomach/Bowel: Stomach, small bowel and colon are unremarkable. No findings suspicious for serosal implants. Appendectomy. Vascular/Lymphatic: Retroaortic left renal vein. Atherosclerotic calcification of the aorta. Reproductive: Hysterectomy.  No adnexal mass. Other: Small bilateral inguinal hernias contain fat. Vague haziness in the ventral left lower quadrant (2/83), status post omentectomy, without discrete nodularity. No free fluid. Musculoskeletal: Degenerative changes in the spine. No worrisome lytic or sclerotic lesions. IMPRESSION: 1. No definitive evidence of metastatic disease. 2. Minimal focal vague haziness in the ventral left lower quadrant, status post omentectomy. No discrete nodules.  Recommend attention on follow-up. 3.  Aortic atherosclerosis (ICD10-I70.0). 4.  Emphysema (ICD10-J43.9). Electronically Signed   By: Lorin Picket M.D.   On: 04/28/2022 14:38     Ovarian cancer on left [facility]) #High-grade serous ovarian cancer-left side-pT1c [stage Ic-spillage during surgery]. S/p adjuvant chemotherapy with CarboTaxol every 3 weeks x 6 cycles.  MARCH 2024- No definitive evidence of metastatic disease; Minimal focal vague haziness in the ventral left lower quadrant, status post omentectomy. No discrete nodules.   # I reviewed the imaging with the patient and her husband today.  Will also have imaging reviewed by Dr. Theora Gianotti.   # Peripheral neuropathy-grade 1.  Discussed regarding acupuncture versus monitoring.  Patient wants to monitor for now.   #Mild hypocalcemia calcium 8.22 Oct 2021- Vit 25-OH: 42.  Continue calcium vitamin D stable.  # Post BF- 120 non-diabetic- monitor for now.  Stable.  # IV access: PIV.   # Patient preference also I think since patient is clinically stable, its reasonable for the patient to follow-up with PCP/gynecology oncology can follow-up with [place] as needed.  I have sent message to [place].   # DISPOSITION: # follow up as needed- Dr.B     # PAIN CONTROL  # GENETICS:  # CLINICAL TRIALS:  # DISPOSITION: # labs today- ordered # Follow up-   Thank you Dr. for allowing me to participate in the care of your pleasant patient. Please do not hesitate to contact me with questions or concerns in the interim.  Above plan of care was discussed with patient/family in detail.  My contact information was given  to the patient/family.     Cammie Sickle, MD 05/03/2022 11:33 AM

## 2022-05-04 LAB — CA 125: Cancer Antigen (CA) 125: 7.8 U/mL (ref 0.0–38.1)

## 2022-05-06 ENCOUNTER — Other Ambulatory Visit: Payer: Self-pay | Admitting: Family Medicine

## 2022-05-06 DIAGNOSIS — F320 Major depressive disorder, single episode, mild: Secondary | ICD-10-CM

## 2022-05-08 NOTE — Telephone Encounter (Signed)
Pt needs appt

## 2022-05-08 NOTE — Telephone Encounter (Signed)
Lvm for pt to call back and schedule an appt.  °

## 2022-05-10 ENCOUNTER — Encounter: Payer: Self-pay | Admitting: Family Medicine

## 2022-05-10 ENCOUNTER — Ambulatory Visit (INDEPENDENT_AMBULATORY_CARE_PROVIDER_SITE_OTHER): Payer: PPO | Admitting: Family Medicine

## 2022-05-10 ENCOUNTER — Encounter: Payer: Self-pay | Admitting: Internal Medicine

## 2022-05-10 VITALS — BP 116/68 | HR 70 | Temp 97.6°F | Resp 16 | Ht 64.0 in | Wt 177.3 lb

## 2022-05-10 DIAGNOSIS — Z6830 Body mass index (BMI) 30.0-30.9, adult: Secondary | ICD-10-CM

## 2022-05-10 DIAGNOSIS — R198 Other specified symptoms and signs involving the digestive system and abdomen: Secondary | ICD-10-CM | POA: Diagnosis not present

## 2022-05-10 DIAGNOSIS — E669 Obesity, unspecified: Secondary | ICD-10-CM

## 2022-05-10 DIAGNOSIS — K219 Gastro-esophageal reflux disease without esophagitis: Secondary | ICD-10-CM | POA: Diagnosis not present

## 2022-05-10 DIAGNOSIS — E782 Mixed hyperlipidemia: Secondary | ICD-10-CM

## 2022-05-10 DIAGNOSIS — R5383 Other fatigue: Secondary | ICD-10-CM

## 2022-05-10 DIAGNOSIS — E538 Deficiency of other specified B group vitamins: Secondary | ICD-10-CM | POA: Diagnosis not present

## 2022-05-10 DIAGNOSIS — E559 Vitamin D deficiency, unspecified: Secondary | ICD-10-CM | POA: Diagnosis not present

## 2022-05-10 DIAGNOSIS — F320 Major depressive disorder, single episode, mild: Secondary | ICD-10-CM

## 2022-05-10 DIAGNOSIS — F411 Generalized anxiety disorder: Secondary | ICD-10-CM

## 2022-05-10 NOTE — Progress Notes (Signed)
Name: [name]   MRN: [id number]    DOB: [birth year]   Date:05/10/2022       Progress Note  Chief Complaint  Patient presents with   Follow-up   Hyperlipidemia   Depression   Anxiety     Subjective:   [name] is a 77 y.o. female, presents to clinic for routine f/up and med refill    Lexapro-  Energy and mood down a little bit- but she does think it is related to finishing chemo and she would like to wait 3 more months to see if sx improve before changing meds    05/10/2022    1:46 PM 03/31/2022   11:33 AM 08/24/2021    1:13 PM  Depression screen PHQ 2/9  Decreased Interest 2 0 2  Down, Depressed, Hopeless 2 0 2  PHQ - 2 Score 4 0 4  Altered sleeping 3  2  Tired, decreased energy 0  2  Change in appetite 0  0  Feeling bad or failure about yourself  1  0  Trouble concentrating 0  1  Moving slowly or fidgety/restless 0  0  Suicidal thoughts 0  0  PHQ-9 Score 8  9  Difficult doing work/chores Somewhat difficult  Not difficult at all    Post chemo fatigue - she is working with oncology team and nurses Not doing therapy  She would like to wait about 6 months post chemo until she adjusts psych meds Currently on lexapro 20   Lovastatin- good compliance, no SE or concerns, due for labs and refills  Lab Results  Component Value Date   CHOL 155 05/16/2021   HDL 46 (L) 05/16/2021   LDLCALC 77 05/16/2021   TRIG 218 (H) 05/16/2021   CHOLHDL 3.4 05/16/2021   GERD  - sx well controlled - uses pepcid prn  Ovarian ca - s/p b/l Oophrectomy and chemo, following with surgeon and oncology - 3 month f/up for 2 years          Current Outpatient Medications:    acetaminophen (TYLENOL) 325 MG tablet, Take 650 mg by mouth every 6 (six) hours as needed., Disp: , Rfl:    Ascorbic Acid (VITAMIN C) 1000 MG tablet, Take 1,000 mg by mouth daily., Disp: , Rfl:    escitalopram (LEXAPRO) 20 MG tablet, TAKE 1 TABLET BY MOUTH EVERY DAY, Disp: 30 tablet, Rfl: 0   famotidine  (PEPCID) 10 MG tablet, Take 1 tablet (10 mg total) by mouth 2 (two) times daily as needed for heartburn or indigestion., Disp: , Rfl:    hydrOXYzine (ATARAX/VISTARIL) 25 MG tablet, TAKE 1-2 TABLETS (25-50 MG TOTAL) BY MOUTH AT BEDTIME AS NEEDED FOR ANXIETY (INSOMNIA)., Disp: 180 tablet, Rfl: 2   lovastatin (MEVACOR) 20 MG tablet, TAKE 1 TABLET BY MOUTH EVERYDAY AT BEDTIME, Disp: 90 tablet, Rfl: 3   melatonin 3 MG TABS tablet, Take 3 mg by mouth at bedtime., Disp: , Rfl:    metoprolol succinate (TOPROL-XL) 25 MG 24 hr tablet, Take by mouth., Disp: , Rfl:    vitamin B-12 (CYANOCOBALAMIN) 1000 MCG tablet, Take 1,000 mcg by mouth daily., Disp: , Rfl:    calcium carbonate (OS-CAL - DOSED IN MG OF ELEMENTAL CALCIUM) 1250 (500 Ca) MG tablet, Take 1 tablet by mouth daily with breakfast. (Patient not taking: Reported on 05/10/2022), Disp: , Rfl:    Cranberry 50 MG CHEW, Chew by mouth. (Patient not taking: Reported on 05/03/2022), Disp: , Rfl:    LORazepam (  ATIVAN) 0.5 MG tablet, Take 1 tablet (0.5 mg total) by mouth every 6 (six) hours as needed for anxiety. (Patient not taking: Reported on 01/30/2022), Disp: 3 tablet, Rfl: 0   ondansetron (ZOFRAN) 8 MG tablet, One pill every 8 hours as needed for nausea/vomitting. (Patient not taking: Reported on 10/20/2021), Disp: 40 tablet, Rfl: 1   prochlorperazine (COMPAZINE) 10 MG tablet, Take 1 tablet (10 mg total) by mouth every 6 (six) hours as needed for nausea or vomiting. (Patient not taking: Reported on 10/20/2021), Disp: 40 tablet, Rfl: 1  Patient Active Problem List   Diagnosis Date Noted   Genetic testing 11/02/2021   Ovarian cancer on left ([place]) 09/28/2021   Depression, unspecified 09/06/2021   Mild intermittent asthma 09/06/2021   Ovarian mass 08/10/2021   PSVT (paroxysmal supraventricular tachycardia) 08/14/2019   Vitamin D deficiency 06/10/2019   Current mild episode of major depressive disorder without prior episode ([place]) 10/15/2018   GAD (generalized  anxiety disorder) 10/15/2018   Personal history of colonic polyps    Obesity (BMI 30.0-34.9) 05/29/2018   GERD (gastroesophageal reflux disease) 11/27/2017   Osteopenia 11/27/2017   Mixed hyperlipidemia 12/09/2014   Insomnia due to stress 12/09/2014    Past Surgical History:  Procedure Laterality Date   BREAST BIOPSY Right    core bx- neg   COLONOSCOPY     COLONOSCOPY WITH PROPOFOL N/A 10/08/2018   Procedure: COLONOSCOPY WITH PROPOFOL;  Surgeon: Lucilla Lame, MD;  Location: [facility] ENDOSCOPY;  Service: Endoscopy;  Laterality: N/A;   MOHS SURGERY     TONSILLECTOMY     VAGINAL HYSTERECTOMY      Family History  Problem Relation Age of Onset   Lung cancer Mother    Diabetes Father    Parkinson's disease Father    Alzheimer's disease Father    Heart attack Father 74   Alcohol abuse Sister    Heart disease Sister    Lung cancer Sister 62   Down syndrome Sister    Colon cancer Paternal Uncle        d. 60s   Melanoma Maternal Grandmother    Stroke Paternal Grandmother    Lung cancer Paternal Grandmother        d. 29s   Stomach cancer Paternal Grandfather        d. 78s   Liver cancer Son 2   Esophageal cancer Neg Hx    Breast cancer Neg Hx     Social History   Tobacco Use   Smoking status: Former    Packs/day: 1.50    Years: 20.00    Additional pack years: 0.00    Total pack years: 30.00    Types: Cigarettes    Quit date: 1993    Years since quitting: 31.2    Passive exposure: Past   Smokeless tobacco: Never  Vaping Use   Vaping Use: Never used  Substance Use Topics   Alcohol use: Yes    Alcohol/week: 1.0 standard drink of alcohol    Types: 1 Glasses of wine per week    Comment: one glass of wine once a month   Drug use: Never     Allergies  Allergen Reactions   Tetanus Toxoids Shortness Of Breath   Sulfa Antibiotics     Severe vomiting    Health Maintenance  Topic Date Due   INFLUENZA VACCINE  05/14/2022 (Originally 09/13/2021)   COVID-19 Vaccine (5 -  2023-24 season) 05/26/2022 (Originally 10/14/2021)   Zoster Vaccines- Shingrix (2 of 2) 08/10/2022 (  Originally 08/10/2020)   COLONOSCOPY (Pts 45-56yrs Insurance coverage will need to be confirmed)  05/10/2023 (Originally 10/07/2021)   MAMMOGRAM  07/20/2022   Medicare Annual Wellness (AWV)  04/01/2023   Pneumonia Vaccine 31+ Years old  Completed   DEXA SCAN  Completed   Hepatitis C Screening  Completed   HPV VACCINES  Aged Out   DTaP/Tdap/Td  Discontinued    Chart Review Today: I personally reviewed active problem list, medication list, allergies, family history, social history, health maintenance, notes from last encounter, lab results, imaging with the patient/caregiver today.   Review of Systems  Constitutional: Negative.   HENT: Negative.    Eyes: Negative.   Respiratory: Negative.    Cardiovascular: Negative.   Gastrointestinal: Negative.   Endocrine: Negative.   Genitourinary: Negative.   Musculoskeletal: Negative.   Skin: Negative.   Allergic/Immunologic: Negative.   Neurological: Negative.   Hematological: Negative.   Psychiatric/Behavioral: Negative.    All other systems reviewed and are negative.    Objective:   Vitals:   05/10/22 1349  BP: 116/68  Pulse: 70  Resp: 16  Temp: 97.6 F (36.4 C)  TempSrc: Oral  SpO2: 97%  Weight: 177 lb 4.8 oz (80.4 kg)  Height: 5\' 4"  (1.626 m)    Body mass index is 30.43 kg/m.  Physical Exam Vitals and nursing note reviewed.  Constitutional:      General: She is not in acute distress.    Appearance: Normal appearance. She is well-developed. She is not ill-appearing, toxic-appearing or diaphoretic.  HENT:     Head: Normocephalic and atraumatic.     Nose: Nose normal.  Eyes:     General: No scleral icterus.       Right eye: No discharge.        Left eye: No discharge.     Conjunctiva/sclera: Conjunctivae normal.  Neck:     Trachea: No tracheal deviation.  Cardiovascular:     Rate and Rhythm: Normal rate and regular  rhythm.     Pulses: Normal pulses.     Heart sounds: Normal heart sounds. No murmur heard.    No friction rub. No gallop.  Pulmonary:     Effort: Pulmonary effort is normal. No respiratory distress.     Breath sounds: Normal breath sounds. No stridor.  Abdominal:     General: Bowel sounds are normal.     Palpations: Abdomen is soft.  Musculoskeletal:        General: Normal range of motion.  Skin:    General: Skin is warm and dry.     Findings: No rash.  Neurological:     Mental Status: She is alert. Mental status is at baseline.     Motor: No abnormal muscle tone.     Coordination: Coordination normal.     Gait: Gait normal.  Psychiatric:        Mood and Affect: Mood normal.        Behavior: Behavior normal.         Assessment & Plan:   Problem List Items Addressed This Visit       Digestive   GERD (gastroesophageal reflux disease)    Sx stable and well controlled with working on diet/lifestyle and using pepcid prn        Other   Mixed hyperlipidemia - Primary    Lovastatin - good compliance and tolerance Due for labs and refills      Relevant Orders   COMPLETE METABOLIC PANEL WITH GFR (Completed)  Lipid panel (Completed)   Current mild episode of major depressive disorder without prior episode    On lexapro - sx a little worse over the past couple months She does not want to change meds right now - still waiting 6 month end of treatment to see if mood/energy is from chemo/tx    05/10/2022    1:46 PM 03/31/2022   11:33 AM 08/24/2021    1:13 PM  Depression screen PHQ 2/9  Decreased Interest 2 0 2  Down, Depressed, Hopeless 2 0 2  PHQ - 2 Score 4 0 4  Altered sleeping 3  2  Tired, decreased energy 0  2  Change in appetite 0  0  Feeling bad or failure about yourself  1  0  Trouble concentrating 0  1  Moving slowly or fidgety/restless 0  0  Suicidal thoughts 0  0  PHQ-9 Score 8  9  Difficult doing work/chores Somewhat difficult  Not difficult at all  We  did discuss med options      GAD (generalized anxiety disorder)    Some increase in sx with recent health changes for herself and husband, she would like to stay on same meds/doses    05/10/2022    1:47 PM 08/24/2021    1:14 PM 08/19/2021    8:31 AM 05/16/2021    8:15 AM  GAD 7 : Generalized Anxiety Score  Nervous, Anxious, on Edge 2 3 2 2   Control/stop worrying 2 3 2 2   Worry too much - different things 2 3 2 1   Trouble relaxing 2 3 2 1   Restless 0 3 0 0  Easily annoyed or irritable 0 0 0 2  Afraid - awful might happen 2 2 3 1   Total GAD 7 Score 10 17 11 9   Anxiety Difficulty Somewhat difficult Not difficult at all Extremely difficult Somewhat difficult          Vitamin D deficiency    Recheck labs      Relevant Orders   COMPLETE METABOLIC PANEL WITH GFR (Completed)   VITAMIN D 25 Hydroxy (Vit-D Deficiency, Fractures) (Completed)   Other Visit Diagnoses     Change in bowel movement       alternating diarrhea and constipation - GI consult recommended by oncology and myself   Fatigue, unspecified type       generalized, screening labs to r/o anemia, hypothyroid or deficiency, mood/MDD/anxiety and recent stressors/chemo/CA all likely somewhat related   Relevant Orders   TSH (Completed)   Hemoglobin A1c (Completed)   Vitamin B12 (Completed)   VITAMIN D 25 Hydroxy (Vit-D Deficiency, Fractures) (Completed)   Vitamin B 12 deficiency       prior low B12 per pt report and chart hx review, recheck labs, and optimize if low   Relevant Orders   Vitamin B12 (Completed)   Class 1 obesity with body mass index (BMI) of 30.0 to 30.9 in adult, unspecified obesity type, unspecified whether serious comorbidity present       Relevant Orders   COMPLETE METABOLIC PANEL WITH GFR (Completed)   Lipid panel (Completed)   Hemoglobin A1c (Completed)   VITAMIN D 25 Hydroxy (Vit-D Deficiency, Fractures) (Completed)        Return in about 6 months (around 11/10/2022) for Routine follow-up.    [name], PA-C 05/10/22 2:04 PM

## 2022-05-11 ENCOUNTER — Encounter: Payer: Self-pay | Admitting: Internal Medicine

## 2022-05-11 LAB — HEMOGLOBIN A1C
Hgb A1c MFr Bld: 5.7 % of total Hgb — ABNORMAL HIGH (ref <5.7)
Mean Plasma Glucose: 117 mg/dL
eAG (mmol/L): 6.5 mmol/L

## 2022-05-11 LAB — VITAMIN B12: Vitamin B-12: 1750 pg/mL — ABNORMAL HIGH (ref 200–1100)

## 2022-05-11 LAB — TSH: TSH: 0.74 mIU/L (ref 0.40–4.50)

## 2022-05-11 LAB — COMPLETE METABOLIC PANEL WITH GFR
AG Ratio: 2 (calc) (ref 1.0–2.5)
ALT: 17 U/L (ref 6–29)
AST: 20 U/L (ref 10–35)
Albumin: 4.3 g/dL (ref 3.6–5.1)
Alkaline phosphatase (APISO): 65 U/L (ref 37–153)
BUN: 14 mg/dL (ref 7–25)
CO2: 28 mmol/L (ref 20–32)
Calcium: 9.5 mg/dL (ref 8.6–10.4)
Chloride: 104 mmol/L (ref 98–110)
Creat: 0.86 mg/dL (ref 0.60–1.00)
Globulin: 2.1 g/dL (calc) (ref 1.9–3.7)
Glucose, Bld: 86 mg/dL (ref 65–99)
Potassium: 4.7 mmol/L (ref 3.5–5.3)
Sodium: 141 mmol/L (ref 135–146)
Total Bilirubin: 0.4 mg/dL (ref 0.2–1.2)
Total Protein: 6.4 g/dL (ref 6.1–8.1)
eGFR: 70 mL/min/{1.73_m2} (ref >=60)

## 2022-05-11 LAB — LIPID PANEL
Cholesterol: 182 mg/dL (ref <200)
HDL: 48 mg/dL — ABNORMAL LOW (ref >=50)
LDL Cholesterol (Calc): 86 mg/dL (calc)
Non-HDL Cholesterol (Calc): 134 mg/dL (calc) — ABNORMAL HIGH (ref <130)
Total CHOL/HDL Ratio: 3.8 (calc) (ref <5.0)
Triglycerides: 358 mg/dL — ABNORMAL HIGH (ref <150)

## 2022-05-11 LAB — VITAMIN D 25 HYDROXY (VIT D DEFICIENCY, FRACTURES): Vit D, 25-Hydroxy: 51 ng/mL (ref 30–100)

## 2022-05-16 NOTE — Assessment & Plan Note (Signed)
Lovastatin - good compliance and tolerance Due for labs and refills

## 2022-05-16 NOTE — Assessment & Plan Note (Signed)
Some increase in sx with recent health changes for herself and husband, she would like to stay on same meds/doses    05/10/2022    1:47 PM 08/24/2021    1:14 PM 08/19/2021    8:31 AM 05/16/2021    8:15 AM  GAD 7 : Generalized Anxiety Score  Nervous, Anxious, on Edge 2 3 2 2   Control/stop worrying 2 3 2 2   Worry too much - different things 2 3 2 1   Trouble relaxing 2 3 2 1   Restless 0 3 0 0  Easily annoyed or irritable 0 0 0 2  Afraid - awful might happen 2 2 3 1   Total GAD 7 Score 10 17 11 9   Anxiety Difficulty Somewhat difficult Not difficult at all Extremely difficult Somewhat difficult

## 2022-05-16 NOTE — Assessment & Plan Note (Signed)
Sx stable and well controlled with working on diet/lifestyle and using pepcid prn

## 2022-05-16 NOTE — Assessment & Plan Note (Signed)
On lexapro - sx a little worse over the past couple months She does not want to change meds right now - still waiting 6 month end of treatment to see if mood/energy is from chemo/tx    05/10/2022    1:46 PM 03/31/2022   11:33 AM 08/24/2021    1:13 PM  Depression screen PHQ 2/9  Decreased Interest 2 0 2  Down, Depressed, Hopeless 2 0 2  PHQ - 2 Score 4 0 4  Altered sleeping 3  2  Tired, decreased energy 0  2  Change in appetite 0  0  Feeling bad or failure about yourself  1  0  Trouble concentrating 0  1  Moving slowly or fidgety/restless 0  0  Suicidal thoughts 0  0  PHQ-9 Score 8  9  Difficult doing work/chores Somewhat difficult  Not difficult at all   We did discuss med options

## 2022-05-16 NOTE — Assessment & Plan Note (Signed)
Recheck labs 

## 2022-05-31 ENCOUNTER — Other Ambulatory Visit: Payer: Self-pay | Admitting: Family Medicine

## 2022-05-31 DIAGNOSIS — F320 Major depressive disorder, single episode, mild: Secondary | ICD-10-CM

## 2022-06-12 ENCOUNTER — Other Ambulatory Visit: Payer: Self-pay | Admitting: Family Medicine

## 2022-06-12 DIAGNOSIS — E782 Mixed hyperlipidemia: Secondary | ICD-10-CM

## 2022-06-19 ENCOUNTER — Other Ambulatory Visit: Payer: Self-pay | Admitting: Family Medicine

## 2022-06-19 DIAGNOSIS — Z1231 Encounter for screening mammogram for malignant neoplasm of breast: Secondary | ICD-10-CM

## 2022-06-30 DIAGNOSIS — T451X5A Adverse effect of antineoplastic and immunosuppressive drugs, initial encounter: Secondary | ICD-10-CM | POA: Diagnosis not present

## 2022-06-30 DIAGNOSIS — R413 Other amnesia: Secondary | ICD-10-CM | POA: Diagnosis not present

## 2022-06-30 DIAGNOSIS — G629 Polyneuropathy, unspecified: Secondary | ICD-10-CM | POA: Diagnosis not present

## 2022-06-30 DIAGNOSIS — G620 Drug-induced polyneuropathy: Secondary | ICD-10-CM | POA: Diagnosis not present

## 2022-07-19 ENCOUNTER — Inpatient Hospital Stay: Payer: PPO

## 2022-07-19 ENCOUNTER — Inpatient Hospital Stay: Payer: PPO | Attending: Obstetrics and Gynecology | Admitting: Obstetrics and Gynecology

## 2022-07-19 VITALS — BP 138/74 | HR 70 | Temp 97.6°F | Resp 20 | Wt 178.0 lb

## 2022-07-19 DIAGNOSIS — J45909 Unspecified asthma, uncomplicated: Secondary | ICD-10-CM | POA: Diagnosis not present

## 2022-07-19 DIAGNOSIS — Z08 Encounter for follow-up examination after completed treatment for malignant neoplasm: Secondary | ICD-10-CM | POA: Insufficient documentation

## 2022-07-19 DIAGNOSIS — Z85828 Personal history of other malignant neoplasm of skin: Secondary | ICD-10-CM | POA: Diagnosis not present

## 2022-07-19 DIAGNOSIS — Z8543 Personal history of malignant neoplasm of ovary: Secondary | ICD-10-CM

## 2022-07-19 DIAGNOSIS — Z90722 Acquired absence of ovaries, bilateral: Secondary | ICD-10-CM | POA: Diagnosis not present

## 2022-07-19 DIAGNOSIS — Z9221 Personal history of antineoplastic chemotherapy: Secondary | ICD-10-CM | POA: Insufficient documentation

## 2022-07-19 DIAGNOSIS — Z8542 Personal history of malignant neoplasm of other parts of uterus: Secondary | ICD-10-CM | POA: Insufficient documentation

## 2022-07-19 NOTE — Progress Notes (Signed)
Gynecologic Oncology Interval Visit   Referring Provider: Dr. Sherron Monday  Chief Complaint: Ovarian Mass  Subjective:  [name] is a 77 y.o. female s/p prior hysterectomy (ovaries in situ) who is seen in consultation from Dr. Sherron Monday for ovarian mass. S/p ex-lap, BSO, biopsies, appendectomy, adhesiolysis with Dr. Sonia Side at [facility] on 09/08/21. She is s/p 6 of 6 planned cycles of adjuvant chemotherapy with carboplatin and paclitaxel, completed 01/30/22.   She saw Dr. Donneta Romberg on 05/03/2022 and was doing well. He released her from clinic to follow-up with GYN oncology.  She has multiple complaints today but most of them seem to be chronic and from chemotherapy.  Says that she saw Dr. Sherryll Burger lately and had several tests obtained workup of fatigue and neuropathy.  CA125 pending.   Component Ref Range & Units 2 mo ago 5 mo ago 6 mo ago 7 mo ago 8 mo ago 9 mo ago 11 mo ago  Cancer Antigen (CA) 125 0.0 - 38.1 U/mL 7.8 10.6 CM 10.3 CM 11.5 CM 14.3 CM      04/26/2022 CT C/A/P IMPRESSION: 1. No definitive evidence of metastatic disease. 2. Minimal focal vague haziness in the ventral left lower quadrant, status post omentectomy. No discrete nodules. Recommend attention on follow-up. 3.  Aortic atherosclerosis (ICD10-I70.0). 4.  Emphysema (ICD10-J43.9).  Gynecologic Oncologic History [name] is a pleasant female s/p prior hysterectomy (ovaries in situ) who is seen in consultation from Dr. Sherron Monday for ovarian mass.   Patient presented to Urology for incontinence. She had elevated residual urine and ultrasound was performed which showed 12.2 x 10.1 x 2.9 cm mixed cystic and solid mass suspicious for ovarian mass.   MRI 08/04/21- Reproductive: Large cystic and solid pelvic mass centered in the pelvis. Ovarian tissue or ovary is not demonstrated on the current study. Mass in total measuring 15 x 11 x 12 cm. Soft tissue component showing restricted diffusion and enhancement centered in the  lower portion of the mass shows heterogeneous T2 signal and measures 10.8 x 6.8 x 8.5 cm amidst cystic areas. There is a small amount of associated ascites. The mass abuts the vaginal apex following hysterectomy, potentially involving the LEFT vaginal apex. The "soft tissue component displays very heterogeneous enhancement and there are numerous enhancing septations throughout. Collateral pathways from LEFT ovarian vein are noted about the LEFT lateral aspect of the mass. Given that the gonadal vein can be traced to the mass suspicion is for mass of LEFT ovarian origin though this may of parasitized flow from ovarian. Vessels on the LEFT   Other: Trace ascites. No discrete peritoneal nodularity about the pelvis.   Musculoskeletal: No suspicious bone lesion   IMPRESSION: 1. Large cystic and solid pelvic mass highly suspicious for neoplasm. Tumor may arise from the LEFT ovary based on proximity to LEFT ovarian vein though no defined normal ovarian tissue can be seen on either the RIGHT or the LEFT on today's exam. Correlate with surgical history. Sarcoma is also considered based on the marked heterogeneity seen within the lesion on the current study. There is strong restricted diffusion within and avid enhancement of the large central soft tissue component. Gyn consultation gyn consultation is suggested. 2. Trace ascites. 3. Tumor in close proximity to sigmoid colon without definitive signs of involvement also in close proximity to the urinary bladder with defined plane between urinary bladder and mass. Mass may involve the LEFT vaginal fornix. 4. Small amount of associated ascites.  She has history of hysterectomy at  age 77 for endometriosis.   We recommended surgical management.  CA 125 was 526 at diagnosis  09/08/2021  Diagnostic laparoscopy, exploratory laparotomy, bilateral salpingo-oophorectomy, infracolic omentectomy, peritoneal biopsies, appendectomy with partial cecectomy, adhesiolysis  (including enterolysis and ovariolysis), and posterior rectus sheath blocks  with Dr Sonia Side at [facility].   She has done well since surgery and has no significant complaints.  She continues on Lovenox.  A. 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 Negative for surface involvement. Bilateral fallopian tubes: Negative for carcinoma.   See synoptic report.   Comment: The tumor has an unusual microcystic morphology, but immunohistochemical studies are most consistent with a high-grade serous carcinoma.   B.  Right ovary, oophorectomy: Right ovary with no pathologic diagnosis.  Negative for carcinoma.   C. Omentum, omentectomy: Benign fibroadipose tissue. Negative for carcinoma.   D. Left paracolic gutter, biopsy: Benign fibroadipose tissue and skeletal muscle. Negative for carcinoma.   E. Left pelvic, biopsy: Benign fibroadipose tissue and smooth muscle. Negative for carcinoma.   F. Anterior cul de sac, biopsy: Benign fibroadipose tissue and smooth muscle. Negative for carcinoma.   G. Right pelvic, biopsy: Benign fibrous tissue and smooth muscle. Negative for carcinoma.   H. Posterior cul de sac, biopsy: Benign fibrous tissue. Negative for carcinoma.   I. Appendix and portion of cecum, appendectomy and partial cecectomy: Appendix and portion of cecum with no pathologic diagnosis. Negative for carcinoma.  Pelvic washings - negative for malignancy  8//31/2023-01/30/2022 :Carboplatin (AUC 6) + Paclitaxel (175) q21d X 6 Cycles     Genetics Assessment: Negative genetic testing. No pathogenic variants identified on the [facility] CancerNext-Expanded+RNA panel. The report date is 10/31/2021. HRD testing through [facility] was also negative and reported out 10/31/2021.    Problem List: Patient Active Problem List   Diagnosis Date Noted   Genetic testing  11/02/2021   Ovarian cancer on left (HCC) 09/28/2021   Depression, unspecified 09/06/2021   Mild intermittent asthma 09/06/2021   Ovarian mass 08/10/2021   PSVT (paroxysmal supraventricular tachycardia) 08/14/2019   Vitamin D deficiency 06/10/2019   Current mild episode of major depressive disorder without prior episode (HCC) 10/15/2018   GAD (generalized anxiety disorder) 10/15/2018   Personal history of colonic polyps    Obesity (BMI 30.0-34.9) 05/29/2018   GERD (gastroesophageal reflux disease) 11/27/2017   Osteopenia 11/27/2017   Mixed hyperlipidemia 12/09/2014   Insomnia due to stress 12/09/2014    Past Medical History: Past Medical History:  Diagnosis Date   Allergy    Arthritis    Asthma    Atypical chest pain 03/14/2019   Atypical mole 06/12/2018   right prox lat thigh/mod   Atypical mole 05/19/2014   left sup med buttock/excision   Atypical mole 03/26/2014   right sup lat buttock   Atypical mole 03/26/2013   left lat mid back   Atypical mole 11/08/2006   right shoulder superior/excision   Basal cell carcinoma 06/12/2016   right medial sup canthus below eyebrow   Basal cell carcinoma 09/23/2014   left nasal bridge   Basal cell carcinoma 03/26/2013   left nasal sidewall   Basal cell carcinoma 08/27/2012   left nasal dorsum   Cancer (HCC)    shoulder, nose skin CA- basal cell CA   GERD (gastroesophageal reflux disease)    occasional   Hyperlipidemia    Osteopenia    Polyp of transverse colon  Right arm weakness 03/14/2019   Stress due to illness of family member 05/29/2018    Past Surgical History: Past Surgical History:  Procedure Laterality Date   BREAST BIOPSY Right    core bx- neg   COLONOSCOPY     COLONOSCOPY WITH PROPOFOL N/A 10/08/2018   Procedure: COLONOSCOPY WITH PROPOFOL;  Surgeon: Midge Minium, MD;  Location: [facility] ENDOSCOPY;  Service: Endoscopy;  Laterality: N/A;   MOHS SURGERY     TONSILLECTOMY     VAGINAL HYSTERECTOMY     Past  Gynecologic History:  Menarche: age 75 Hysterectomy at age 46.   OB History:  OB History  No obstetric history on file.   Family History: Family History  Problem Relation Age of Onset   Lung cancer Mother    Diabetes Father    Parkinson's disease Father    Alzheimer's disease Father    Heart attack Father 84   Alcohol abuse Sister    Heart disease Sister    Lung cancer Sister 57   Down syndrome Sister    Colon cancer Paternal Uncle        d. 68s   Melanoma Maternal Grandmother    Stroke Paternal Grandmother    Lung cancer Paternal Grandmother        d. 62s   Stomach cancer Paternal Grandfather        d. 33s   Liver cancer Son 30   Esophageal cancer Neg Hx    Breast cancer Neg Hx     Social History: Social History   Socioeconomic History   Marital status: Married    Spouse name: [name]   Number of children: 2   Years of education: Not on file   Highest education level: Bachelor's degree (e.g., BA, AB, BS)  Occupational History   Occupation: Retired  Tobacco Use   Smoking status: Former    Packs/day: 1.50    Years: 20.00    Additional pack years: 0.00    Total pack years: 30.00    Types: Cigarettes    Quit date: 1993    Years since quitting: 31.4    Passive exposure: Past   Smokeless tobacco: Never  Vaping Use   Vaping Use: Never used  Substance and Sexual Activity   Alcohol use: Yes    Alcohol/week: 1.0 standard drink of alcohol    Types: 1 Glasses of wine per week    Comment: one glass of wine once a month   Drug use: Never   Sexual activity: Not Currently  Other Topics Concern   Not on file  Social History Narrative   Lives with husband in [place]; quit smoking > 30 years; once every 1-2 months; retd. Teacher- elementary school;    Social Determinants of Health   Financial Resource Strain: Low Risk  (05/09/2022)   Overall Financial Resource Strain (CARDIA)    Difficulty of Paying Living Expenses: Not hard at all  Food Insecurity: No Food  Insecurity (05/09/2022)   Hunger Vital Sign    Worried About Running Out of Food in the Last Year: Never true    Ran Out of Food in the Last Year: Never true  Transportation Needs: No Transportation Needs (05/09/2022)   PRAPARE - Administrator, Civil Service (Medical): No    Lack of Transportation (Non-Medical): No  Physical Activity: Insufficiently Active (05/09/2022)   Exercise Vital Sign    Days of Exercise per Week: 3 days    Minutes of Exercise per Session: 20 min  Stress: Stress Concern Present (05/09/2022)   [employer] of Occupational Health - Occupational Stress Questionnaire    Feeling of Stress : To some extent  Social Connections: Socially Integrated (05/09/2022)   Social Connection and Isolation Panel [NHANES]    Frequency of Communication with Friends and Family: Three times a week    Frequency of Social Gatherings with Friends and Family: Three times a week    Attends Religious Services: More than 4 times per year    Active Member of Clubs or Organizations: Yes    Attends Banker Meetings: More than 4 times per year    Marital Status: Married  Catering manager Violence: Not At Risk (03/31/2022)   Humiliation, Afraid, Rape, and Kick questionnaire    Fear of Current or Ex-Partner: No    Emotionally Abused: No    Physically Abused: No    Sexually Abused: No    Allergies: Allergies  Allergen Reactions   Tetanus Toxoids Shortness Of Breath   Sulfa Antibiotics     Severe vomiting    Current Medications: Current Outpatient Medications  Medication Sig Dispense Refill   acetaminophen (TYLENOL) 325 MG tablet Take 650 mg by mouth every 6 (six) hours as needed.     Ascorbic Acid (VITAMIN C) 1000 MG tablet Take 1,000 mg by mouth daily.     escitalopram (LEXAPRO) 20 MG tablet TAKE 1 TABLET BY MOUTH EVERY DAY 90 tablet 1   famotidine (PEPCID) 10 MG tablet Take 1 tablet (10 mg total) by mouth 2 (two) times daily as needed for heartburn or  indigestion.     hydrOXYzine (ATARAX/VISTARIL) 25 MG tablet TAKE 1-2 TABLETS (25-50 MG TOTAL) BY MOUTH AT BEDTIME AS NEEDED FOR ANXIETY (INSOMNIA). 180 tablet 2   lovastatin (MEVACOR) 20 MG tablet TAKE 1 TABLET BY MOUTH EVERYDAY AT BEDTIME 90 tablet 3   melatonin 3 MG TABS tablet Take 3 mg by mouth at bedtime.     metoprolol succinate (TOPROL-XL) 25 MG 24 hr tablet Take by mouth.     vitamin B-12 (CYANOCOBALAMIN) 1000 MCG tablet Take 1,000 mcg by mouth daily.     No current facility-administered medications for this visit.    Review of Systems General: Fatigue otherwise no complaints  HEENT: no complaints  Lungs: Shortness of breath otherwise no complaints  Cardiac: no complaints  GI: Alternating constipation and diarrhea otherwise no complaints  GU: no complaints  Musculoskeletal: no complaints  Extremities: no complaints  Skin: no complaints  Neuro: Peripheral neuropathy complaints otherwise no complaints  Endocrine: no complaints  Psych: no complaints      All of the symptoms really started after chemotherapy initiated.  Objective:  Physical Examination:  BP 138/74   Pulse 70   Temp 97.6 F (36.4 C)   Resp 20   Wt 178 lb (80.7 kg)   LMP  (LMP Unknown)   SpO2 100%   BMI 30.55 kg/m     ECOG Performance Status: 1 - Symptomatic but completely ambulatory  GENERAL: Patient is a well appearing female in no acute distress HEENT:  Atraumatic and normocephalic.  NODES:  No cervical, supraclavicular, axillary, or inguinal lymphadenopathy palpated.  LUNGS:  Clear to auscultation bilaterally.   HEART:  Regular rate and rhythm.  ABDOMEN:  Soft, nontender. Nondistended. No masses/ascites/hernia/or hepatomegaly.  EXTREMITIES:  No peripheral edema.   NEURO:  Nonfocal. Well oriented.  Appropriate affect.  Pelvic: EGBUS: no lesions Cervix: surgically absent Vagina: no lesions, no discharge or bleeding Uterus: surgically absent BME:  no palpable masses Rectovaginal: deferred    Lab Review:  Lab Results  Component Value Date   WBC 9.6 05/03/2022   HGB 13.3 05/03/2022   HCT 40.0 05/03/2022   MCV 97.8 05/03/2022   PLT 171 05/03/2022   Lab Results  Component Value Date   VITAMINB12 1,750 (H) 05/10/2022   Folate (Folic Acid) > 16.1 Lab Results  Component Value Date   TSH 0.74 05/10/2022      Radiologic Imaging: No imaging on site.     Assessment:  ANYELIN HORWITZ is a 77 y.o. female stage Ic high grade serous cancer of the ovary s/p diagnostic laparoscopy, ex lap, BSO, infracolic omentectomy, peritoneal biopsies, appendectomy with partial cecectomy, adhesiolysis, and posterior rectus sheath blocks. She had prior hysterectomy. Pathology consistent with high grade serous. Now s/p 6 cycles of adjuvant carboplatin-paclitaxel. CA 125 has normalized and is low at 7.8. CT 04/2022 no definitive recurrence. Clinically, NED today.   Medical co-morbidities complicating care: prior vaginal hysterectomy; asthma, and prior skin cancers Plan:   Problem List Items Addressed This Visit   None Visit Diagnoses     Encounter for follow-up surveillance of ovarian cancer    -  Primary   Relevant Orders   CA 125       CA 125 has improved from 526 to 7.8.  She will see Dr Sonia Side back in 3 months for surveillance and pelvic exam.  Patient prefers female providers for pelvic exam.  Plan for CT scan chest abdomen and pelvis if she has concerning symptoms or rising CA125.  I reviewed all her labs with her and recommended she follow-up with her primary care doctor.  We talked about precautionary symptoms to watch for for cancer recurrence.  The patient's diagnosis, an outline of the further diagnostic and laboratory studies which will be required, the recommendation for surgery, and alternatives were discussed with her and her accompanying family members.  All questions were answered to their satisfaction.  Elizabeth Nephew Leta Jungling, MD

## 2022-07-19 NOTE — Patient Instructions (Signed)
It is common for patients who are undergoing treatment and taking certain prescribed medications to experience side-effects with constipation.  If you experience constipation, please take stool softeners such as Senna and/or Miralax every day to avoid constipation.  These medications are available over the counter.  Of course, if you have diarrhea, stop taking stool softeners.  Drinking plenty of fluid, eating fruits and vegetable, and being active also reduces the risk of constipation.   If despite taking stool softeners, and you still have no bowel movement for 2 days or more than your normal bowel habit frequency, please take one of the following over the counter laxatives:  Milk of Magnesia or Mag Citrate everyday and contact me immediately for further instructions.  The goal is to have at least one bowel movement every day or every other day without pain or straining.   It was a pleasure seeing you today and thank you for allowing me to participate in your care. -Consuello Masse, NP

## 2022-07-21 LAB — CA 125: Cancer Antigen (CA) 125: 7.2 U/mL (ref 0.0–38.1)

## 2022-07-26 ENCOUNTER — Ambulatory Visit
Admission: RE | Admit: 2022-07-26 | Discharge: 2022-07-26 | Disposition: A | Discharge status: Home / Self Care | Payer: PPO | Source: Ambulatory Visit | Attending: Family Medicine | Admitting: Family Medicine

## 2022-07-26 DIAGNOSIS — Z1231 Encounter for screening mammogram for malignant neoplasm of breast: Secondary | ICD-10-CM | POA: Diagnosis not present

## 2022-08-10 ENCOUNTER — Ambulatory Visit: Payer: PPO | Admitting: Family Medicine

## 2022-08-14 ENCOUNTER — Ambulatory Visit: Payer: PPO | Admitting: Family Medicine

## 2022-08-16 DIAGNOSIS — R202 Paresthesia of skin: Secondary | ICD-10-CM | POA: Diagnosis not present

## 2022-08-16 DIAGNOSIS — R200 Anesthesia of skin: Secondary | ICD-10-CM | POA: Diagnosis not present

## 2022-08-23 ENCOUNTER — Ambulatory Visit (INDEPENDENT_AMBULATORY_CARE_PROVIDER_SITE_OTHER): Payer: PPO | Admitting: Family Medicine

## 2022-08-23 ENCOUNTER — Encounter: Payer: Self-pay | Admitting: Family Medicine

## 2022-08-23 VITALS — BP 126/74 | HR 69 | Temp 97.4°F | Resp 18 | Ht 64.0 in | Wt 181.3 lb

## 2022-08-23 DIAGNOSIS — G4700 Insomnia, unspecified: Secondary | ICD-10-CM

## 2022-08-23 DIAGNOSIS — R5383 Other fatigue: Secondary | ICD-10-CM

## 2022-08-23 DIAGNOSIS — F439 Reaction to severe stress, unspecified: Secondary | ICD-10-CM

## 2022-08-23 DIAGNOSIS — F411 Generalized anxiety disorder: Secondary | ICD-10-CM

## 2022-08-23 DIAGNOSIS — M545 Low back pain, unspecified: Secondary | ICD-10-CM

## 2022-08-23 MED ORDER — TRAZODONE HCL 50 MG PO TABS: 25.0000 mg | ORAL_TABLET | Freq: Every evening | ORAL | 1 refills | Status: DC | PRN

## 2022-08-23 NOTE — Patient Instructions (Signed)
Sleep Hygiene Tips 1) Get regular. One of the best ways to train your body to sleep well is to go to bed and get up at more or less the same time every day, even on weekends and days off! This regular rhythm will make you feel better and will give your body something to work from. 2) Sleep when sleepy. Only try to sleep when you actually feel tired or sleepy, rather than spending too much time awake in bed. 3) Get up & try again. If you haven't been able to get to sleep after about 20 minutes or more, get up and do something calming or boring until you feel sleepy, then return to bed and try again. Sit quietly on the couch with the lights off (bright light will tell your brain that it is time to wake up), or read something boring like the phone book. Avoid doing anything that is too stimulating or interesting, as this will wake you up even more. 4) Avoid caffeine & nicotine. It is best to avoid consuming any caffeine (in coffee, tea, cola drinks, chocolate, and some medications) or nicotine (cigarettes) for at least 4-6 hours before going to bed. These substances act as stimulants and interfere with the ability to fall asleep 5) Avoid alcohol. It is also best to avoid alcohol for at least 4-6 hours before going to bed. Many people believe that alcohol is relaxing and helps them to get to sleep at first, but it actually interrupts the quality of sleep. 6) Bed is for sleeping. Try not to use your bed for anything other than sleeping and sex, so that your body comes to associate bed with sleep. If you use bed as a place to watch TV, eat, read, work on your laptop, pay bills, and other things, your body will not learn this Connection. 7) No naps. It is best to avoid taking naps during the day, to make sure that you are tired at bedtime. If you can't make it through the day without a nap, make sure it is for less than an hour and before 3pm. 8) Sleep rituals. You can develop your  own rituals of things to remind your body that it is time to sleep - some people find it useful to do relaxing stretches or breathing exercises for 15 minutes before bed each night, or sit calmly with a cup of caffeine-free tea. 9) Bathtime. Having a hot bath 1-2 hours before bedtime can be useful, as it will raise your body temperature, causing you to feel sleepy as your body temperature drops again. Research shows that sleepiness is associated with a drop in body temperature. 10) No clock-watching. Many people who struggle with sleep tend to watch the clock too much. Frequently checking the clock during the night can wake you up (especially if you turn on the light to read the time) and reinforces negative thoughts such as "Oh no, look how late it is, I'll never get to sleep" or "it's so early, I have only slept for 5 hours, this is terrible." 11) Use a sleep diary. This worksheet can be a useful way of making sure you have the right facts about your sleep, rather than making assumptions. Because a diary involves watching the clock (see point 10) it is a good idea to only use it for two weeks to get an idea of what is going and then perhaps two months down the track to see how you are progressing. 12) Exercise. Regular exercise is   a good idea to help with good sleep, but try not to do strenuous exercise in the 4 hours before bedtime. Morning walks are a great way to start the day feeling refreshed! 13) Eat right. A healthy, balanced diet will help you to sleep well, but timing is important. Some people find that a very empty stomach at bedtime is distracting, so it can be useful to have a light snack, but a heavy meal soon before bed can also interrupt sleep. Some people recommend a warm glass of milk, which contains tryptophan, which acts as a natural sleep inducer. 14) The right space. It is very important that your bed and bedroom are quiet and comfortable for sleeping. A  cooler room with enough blankets to stay warm is best, and make sure you have curtains or an eyemask to block out early morning light and earplugs if there is noise outside your room. 15) Keep daytime routine the same. Even if you have a bad night sleep and are tired it is important that you try to keep your daytime activities the same as you had planned. That is, don't avoid activities because you feel tired. This can reinforce the insomnia.  

## 2022-08-23 NOTE — Progress Notes (Signed)
Name: [name]   MRN: [id number]    DOB: 04-Jun-1945   Date:08/23/2022       Progress Note  Chief Complaint  Patient presents with   Follow-up     Subjective:   [name] is a 77 y.o. female, presents to clinic for routine f/up chronic   Sleep - not sleeping takes several hours to get to sleep Still fatigued Phq reviewed again today, slight improvement - on lexapro 20     08/23/2022    1:21 PM 05/10/2022    1:46 PM 03/31/2022   11:33 AM  Depression screen PHQ 2/9  Decreased Interest 1 2 0  Down, Depressed, Hopeless 1 2 0  PHQ - 2 Score 2 4 0  Altered sleeping 1 3   Tired, decreased energy 1 0   Change in appetite 1 0   Feeling bad or failure about yourself  0 1   Trouble concentrating 0 0   Moving slowly or fidgety/restless 0 0   Suicidal thoughts 0 0   PHQ-9 Score 5 8   Difficult doing work/chores Not difficult at all Somewhat difficult       08/23/2022    1:21 PM 05/10/2022    1:47 PM 08/24/2021    1:14 PM 08/19/2021    8:31 AM  GAD 7 : Generalized Anxiety Score  Nervous, Anxious, on Edge 0 2 3 2   Control/stop worrying 0 2 3 2   Worry too much - different things 0 2 3 2   Trouble relaxing 0 2 3 2   Restless 0 0 3 0  Easily annoyed or irritable 0 0 0 0  Afraid - awful might happen 0 2 2 3   Total GAD 7 Score 0 10 17 11   Anxiety Difficulty Not difficult at all Somewhat difficult Not difficult at all Extremely difficult   She mentions at end of visit that her back hurts     Current Outpatient Medications:    acetaminophen (TYLENOL) 325 MG tablet, Take 650 mg by mouth every 6 (six) hours as needed., Disp: , Rfl:    Ascorbic Acid (VITAMIN C) 1000 MG tablet, Take 1,000 mg by mouth daily., Disp: , Rfl:    escitalopram (LEXAPRO) 20 MG tablet, TAKE 1 TABLET BY MOUTH EVERY DAY, Disp: 90 tablet, Rfl: 1   famotidine (PEPCID) 10 MG tablet, Take 1 tablet (10 mg total) by mouth 2 (two) times daily as needed for heartburn or indigestion., Disp: , Rfl:    hydrOXYzine  (ATARAX/VISTARIL) 25 MG tablet, TAKE 1-2 TABLETS (25-50 MG TOTAL) BY MOUTH AT BEDTIME AS NEEDED FOR ANXIETY (INSOMNIA)., Disp: 180 tablet, Rfl: 2   lovastatin (MEVACOR) 20 MG tablet, TAKE 1 TABLET BY MOUTH EVERYDAY AT BEDTIME, Disp: 90 tablet, Rfl: 3   melatonin 3 MG TABS tablet, Take 3 mg by mouth at bedtime., Disp: , Rfl:    metoprolol succinate (TOPROL-XL) 25 MG 24 hr tablet, Take by mouth., Disp: , Rfl:    vitamin B-12 (CYANOCOBALAMIN) 1000 MCG tablet, Take 1,000 mcg by mouth daily., Disp: , Rfl:   Patient Active Problem List   Diagnosis Date Noted   Genetic testing 11/02/2021   Ovarian cancer on left (HCC) 09/28/2021   Depression, unspecified 09/06/2021   Mild intermittent asthma 09/06/2021   Ovarian mass 08/10/2021   PSVT (paroxysmal supraventricular tachycardia) 08/14/2019   Vitamin D deficiency 06/10/2019   Current mild episode of major depressive disorder without prior episode (HCC) 10/15/2018   GAD (generalized anxiety disorder) 10/15/2018  Personal history of colonic polyps    Obesity (BMI 30.0-34.9) 05/29/2018   GERD (gastroesophageal reflux disease) 11/27/2017   Osteopenia 11/27/2017   Mixed hyperlipidemia 12/09/2014   Insomnia due to stress 12/09/2014    Past Surgical History:  Procedure Laterality Date   BREAST BIOPSY Right    core bx- neg   COLONOSCOPY     COLONOSCOPY WITH PROPOFOL N/A 10/08/2018   Procedure: COLONOSCOPY WITH PROPOFOL;  Surgeon: Midge Minium, MD;  Location: [facility] ENDOSCOPY;  Service: Endoscopy;  Laterality: N/A;   MOHS SURGERY     TONSILLECTOMY     VAGINAL HYSTERECTOMY      Family History  Problem Relation Age of Onset   Lung cancer Mother    Diabetes Father    Parkinson's disease Father    Alzheimer's disease Father    Heart attack Father 11   Alcohol abuse Sister    Heart disease Sister    Lung cancer Sister 52   Down syndrome Sister    Colon cancer Paternal Uncle        d. 76s   Melanoma Maternal Grandmother    Stroke Paternal  Grandmother    Lung cancer Paternal Grandmother        d. 69s   Stomach cancer Paternal Grandfather        d. 14s   Liver cancer Son 50   Esophageal cancer Neg Hx    Breast cancer Neg Hx     Social History   Tobacco Use   Smoking status: Former    Packs/day: 1.50    Years: 20.00    Additional pack years: 0.00    Total pack years: 30.00    Types: Cigarettes    Quit date: 1993    Years since quitting: 31.5    Passive exposure: Past   Smokeless tobacco: Never  Vaping Use   Vaping Use: Never used  Substance Use Topics   Alcohol use: Yes    Alcohol/week: 1.0 standard drink of alcohol    Types: 1 Glasses of wine per week    Comment: one glass of wine once a month   Drug use: Never     Allergies  Allergen Reactions   Tetanus Toxoids Shortness Of Breath   Sulfa Antibiotics     Severe vomiting    Health Maintenance  Topic Date Due   COVID-19 Vaccine (5 - 2023-24 season) 09/08/2022 (Originally 10/14/2021)   Zoster Vaccines- Shingrix (2 of 2) 11/23/2022 (Originally 08/10/2020)   Colonoscopy  05/10/2023 (Originally 10/07/2021)   INFLUENZA VACCINE  09/14/2022   Medicare Annual Wellness (AWV)  04/01/2023   MAMMOGRAM  07/26/2023   Pneumonia Vaccine 22+ Years old  Completed   DEXA SCAN  Completed   Hepatitis C Screening  Completed   HPV VACCINES  Aged Out   DTaP/Tdap/Td  Discontinued    Chart Review Today: I personally reviewed active problem list, medication list, allergies, family history, social history, health maintenance, notes from last encounter, lab results, imaging with the patient/caregiver today.   Review of Systems  Constitutional: Negative.   HENT: Negative.    Eyes: Negative.   Respiratory: Negative.    Cardiovascular: Negative.   Gastrointestinal: Negative.   Endocrine: Negative.   Genitourinary: Negative.   Musculoskeletal: Negative.   Skin: Negative.   Allergic/Immunologic: Negative.   Neurological: Negative.   Hematological: Negative.    Psychiatric/Behavioral: Negative.    All other systems reviewed and are negative.    Objective:   Vitals:   08/23/22 1308  BP: 126/74  Pulse: 69  Resp: 18  Temp: (!) 97.4 F (36.3 C)  SpO2: 96%  Weight: 181 lb 4.8 oz (82.2 kg)  Height: 5\' 4"  (1.626 m)    Body mass index is 31.12 kg/m.  Physical Exam Vitals and nursing note reviewed.  Constitutional:      General: She is not in acute distress.    Appearance: Normal appearance. She is well-developed. She is not ill-appearing, toxic-appearing or diaphoretic.  HENT:     Head: Normocephalic and atraumatic.     Nose: Nose normal.  Eyes:     General:        Right eye: No discharge.        Left eye: No discharge.     Conjunctiva/sclera: Conjunctivae normal.  Neck:     Trachea: No tracheal deviation.  Cardiovascular:     Rate and Rhythm: Normal rate and regular rhythm.  Pulmonary:     Effort: Pulmonary effort is normal. No respiratory distress.     Breath sounds: No stridor.  Musculoskeletal:        General: Normal range of motion.  Skin:    General: Skin is warm and dry.     Findings: No rash.  Neurological:     Mental Status: She is alert.     Motor: No abnormal muscle tone.     Coordination: Coordination normal.  Psychiatric:        Behavior: Behavior normal.         Assessment & Plan:     ICD-10-CM   1. GAD (generalized anxiety disorder)  F41.1    GAD 7 score improved significantly, phq still positive, continue lexapro    2. Situational stress  F43.9    Encourage continued Lexapro, trial of hydroxyzine, caregiver support and referrals    3. Bilateral low back pain without sciatica, unspecified chronicity  M54.50 DG Lumbar Spine Complete    Ambulatory referral to Physical Therapy   recommend recheck Lumbar films and eval and tx with PT    4. Fatigue, unspecified type  R53.83    labs checked previously, no anemia, deficiencies or hypothyroid, may be post chemo energy changes, due to poor sleep or  continued stress?    5. Insomnia, unspecified type  G47.00    trial of meds, work on sleep hygeine         Return in about 3 months (around 11/23/2022) for MDD/GAD, fatigue, insomnia.   Danelle Berry, PA-C 08/23/22 1:28 PM

## 2022-08-30 ENCOUNTER — Ambulatory Visit
Admission: RE | Admit: 2022-08-30 | Discharge: 2022-08-30 | Disposition: A | Discharge status: Home / Self Care | Payer: PPO | Source: Ambulatory Visit | Attending: Family Medicine | Admitting: Family Medicine

## 2022-08-30 ENCOUNTER — Ambulatory Visit
Admission: RE | Admit: 2022-08-30 | Discharge: 2022-08-30 | Disposition: A | Discharge status: Home / Self Care | Payer: PPO | Attending: Family Medicine | Admitting: Family Medicine

## 2022-08-30 ENCOUNTER — Other Ambulatory Visit: Payer: Self-pay | Admitting: Family Medicine

## 2022-08-30 DIAGNOSIS — M545 Low back pain, unspecified: Secondary | ICD-10-CM | POA: Insufficient documentation

## 2022-08-30 DIAGNOSIS — M5126 Other intervertebral disc displacement, lumbar region: Secondary | ICD-10-CM | POA: Diagnosis not present

## 2022-08-30 DIAGNOSIS — I700 Atherosclerosis of aorta: Secondary | ICD-10-CM | POA: Diagnosis not present

## 2022-08-30 DIAGNOSIS — M47816 Spondylosis without myelopathy or radiculopathy, lumbar region: Secondary | ICD-10-CM | POA: Diagnosis not present

## 2022-09-08 ENCOUNTER — Encounter: Payer: Self-pay | Admitting: Family Medicine

## 2022-09-14 ENCOUNTER — Other Ambulatory Visit: Payer: Self-pay

## 2022-09-14 DIAGNOSIS — M545 Low back pain, unspecified: Secondary | ICD-10-CM

## 2022-09-15 DIAGNOSIS — M545 Low back pain, unspecified: Secondary | ICD-10-CM | POA: Diagnosis not present

## 2022-10-17 DIAGNOSIS — Z515 Encounter for palliative care: Secondary | ICD-10-CM | POA: Diagnosis not present

## 2022-10-17 DIAGNOSIS — Z6830 Body mass index (BMI) 30.0-30.9, adult: Secondary | ICD-10-CM | POA: Diagnosis not present

## 2022-10-17 DIAGNOSIS — Z8543 Personal history of malignant neoplasm of ovary: Secondary | ICD-10-CM | POA: Diagnosis not present

## 2022-10-17 DIAGNOSIS — T451X5D Adverse effect of antineoplastic and immunosuppressive drugs, subsequent encounter: Secondary | ICD-10-CM | POA: Diagnosis not present

## 2022-10-17 DIAGNOSIS — G620 Drug-induced polyneuropathy: Secondary | ICD-10-CM | POA: Diagnosis not present

## 2022-10-17 DIAGNOSIS — F3341 Major depressive disorder, recurrent, in partial remission: Secondary | ICD-10-CM | POA: Diagnosis not present

## 2022-10-25 ENCOUNTER — Inpatient Hospital Stay: Payer: PPO

## 2022-11-06 DIAGNOSIS — M6530 Trigger finger, unspecified finger: Secondary | ICD-10-CM | POA: Diagnosis not present

## 2022-11-06 DIAGNOSIS — G629 Polyneuropathy, unspecified: Secondary | ICD-10-CM | POA: Diagnosis not present

## 2022-11-06 DIAGNOSIS — R413 Other amnesia: Secondary | ICD-10-CM | POA: Diagnosis not present

## 2022-11-06 DIAGNOSIS — R519 Headache, unspecified: Secondary | ICD-10-CM | POA: Diagnosis not present

## 2022-11-15 ENCOUNTER — Inpatient Hospital Stay: Payer: PPO | Attending: Internal Medicine

## 2022-11-15 ENCOUNTER — Inpatient Hospital Stay (HOSPITAL_BASED_OUTPATIENT_CLINIC_OR_DEPARTMENT_OTHER): Payer: PPO | Admitting: Nurse Practitioner

## 2022-11-15 VITALS — BP 134/64 | HR 72 | Temp 97.3°F | Wt 178.0 lb

## 2022-11-15 DIAGNOSIS — Z08 Encounter for follow-up examination after completed treatment for malignant neoplasm: Secondary | ICD-10-CM | POA: Diagnosis not present

## 2022-11-15 DIAGNOSIS — Z8543 Personal history of malignant neoplasm of ovary: Secondary | ICD-10-CM

## 2022-11-15 DIAGNOSIS — Z90710 Acquired absence of both cervix and uterus: Secondary | ICD-10-CM | POA: Insufficient documentation

## 2022-11-15 DIAGNOSIS — Z9221 Personal history of antineoplastic chemotherapy: Secondary | ICD-10-CM | POA: Insufficient documentation

## 2022-11-15 DIAGNOSIS — Z90722 Acquired absence of ovaries, bilateral: Secondary | ICD-10-CM | POA: Insufficient documentation

## 2022-11-15 DIAGNOSIS — Z85828 Personal history of other malignant neoplasm of skin: Secondary | ICD-10-CM | POA: Diagnosis not present

## 2022-11-15 DIAGNOSIS — C562 Malignant neoplasm of left ovary: Secondary | ICD-10-CM | POA: Diagnosis not present

## 2022-11-15 LAB — WET PREP, GENITAL
Clue Cells Wet Prep HPF POC: NONE SEEN
Sperm: NONE SEEN
Trich, Wet Prep: NONE SEEN
WBC, Wet Prep HPF POC: <10 (ref <10)
Yeast Wet Prep HPF POC: NONE SEEN

## 2022-11-15 NOTE — Progress Notes (Signed)
Gynecologic Oncology Interval Visit   Referring Provider: Dr. 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 female s/p prior hysterectomy (ovaries in situ) who is seen in consultation from Dr. Sherron Monday for ovarian mass. S/p ex-lap, BSO, biopsies, appendectomy, adhesiolysis with Dr. Sonia Side at [facility] on 09/08/21. She is s/p 6 of 6 planned cycles of adjuvant chemotherapy with carboplatin and paclitaxel, completed 01/30/22.   She was seen by Dr. Sonia Side for surveillance on 07/19/22. Was NED at that time.   She has chronic fatigue and neuropathy which is managed by Dr. Sherryll Burger, neurology.   CA125 pending.   Component Ref Range & Units 2 mo ago 5 mo ago 6 mo ago 7 mo ago 8 mo ago 9 mo ago 11 mo ago  Cancer Antigen (CA) 125 0.0 - 38.1 U/mL 7.8 10.6 CM 10.3 CM 11.5 CM 14.3 CM       Gynecologic Oncologic History [name] is a pleasant female s/p prior hysterectomy (ovaries in situ) who is seen in consultation from Dr. Sherron Monday for ovarian mass.   Patient presented to Urology for incontinence. She had elevated residual urine and ultrasound was performed which showed 12.2 x 10.1 x 2.9 cm mixed cystic and solid mass suspicious for ovarian mass.   MRI 08/04/21- Reproductive: Large cystic and solid pelvic mass centered in the pelvis. Ovarian tissue or ovary is not demonstrated on the current study. Mass in total measuring 15 x 11 x 12 cm. Soft tissue component showing restricted diffusion and enhancement centered in the lower portion of the mass shows heterogeneous T2 signal and measures 10.8 x 6.8 x 8.5 cm amidst cystic areas. There is a small amount of associated ascites. The mass abuts the vaginal apex following hysterectomy, potentially involving the LEFT vaginal apex. The "soft tissue component displays very heterogeneous enhancement and there are numerous enhancing septations throughout. Collateral pathways from LEFT ovarian vein  are noted about the LEFT lateral aspect of the mass. Given that the gonadal vein can be traced to the mass suspicion is for mass of LEFT ovarian origin though this may of parasitized flow from ovarian. Vessels on the LEFT   Other: Trace ascites. No discrete peritoneal nodularity about the pelvis.   Musculoskeletal: No suspicious bone lesion   IMPRESSION: 1. Large cystic and solid pelvic mass highly suspicious for neoplasm. Tumor may arise from the LEFT ovary based on proximity to LEFT ovarian vein though no defined normal ovarian tissue can be seen on either the RIGHT or the LEFT on today's exam. Correlate with surgical history. Sarcoma is also considered based on the marked heterogeneity seen within the lesion on the current study. There is strong restricted diffusion within and avid enhancement of the large central soft tissue component. Gyn consultation gyn consultation is suggested. 2. Trace ascites. 3. Tumor in close proximity to sigmoid colon without definitive signs of involvement also in close proximity to the urinary bladder with defined plane between urinary bladder and mass. Mass may involve the LEFT vaginal fornix. 4. Small amount of associated ascites.  She has history of hysterectomy at age 36 for endometriosis.   We recommended surgical management.  CA 125 was 526 at diagnosis  09/08/2021  Diagnostic laparoscopy, exploratory laparotomy, bilateral salpingo-oophorectomy, infracolic omentectomy, peritoneal biopsies, appendectomy with partial cecectomy (due to frozen section suggesting mucinous ovarian tumor and appendix was adherent to cecum) adhesiolysis (including enterolysis and ovariolysis), and posterior rectus sheath blocks  with Dr  Secord at [facility]. Mass was decompressed with controlled drainage due to large size and extensive adhesions.  She has done well since surgery and has no significant complaints.  She continues on Lovenox.  A. 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 Negative for surface involvement. Bilateral fallopian tubes: Negative for carcinoma.   See synoptic report.   Comment: The tumor has an unusual microcystic morphology, but immunohistochemical studies are most consistent with a high-grade serous carcinoma.   B.  Right ovary, oophorectomy: Right ovary with no pathologic diagnosis.  Negative for carcinoma.   C. Omentum, omentectomy: Benign fibroadipose tissue. Negative for carcinoma.   D. Left paracolic gutter, biopsy: Benign fibroadipose tissue and skeletal muscle. Negative for carcinoma.   E. Left pelvic, biopsy: Benign fibroadipose tissue and smooth muscle. Negative for carcinoma.   F. Anterior cul de sac, biopsy: Benign fibroadipose tissue and smooth muscle. Negative for carcinoma.   G. Right pelvic, biopsy: Benign fibrous tissue and smooth muscle. Negative for carcinoma.   H. Posterior cul de sac, biopsy: Benign fibrous tissue. Negative for carcinoma.   I. Appendix and portion of cecum, appendectomy and partial cecectomy: Appendix and portion of cecum with no pathologic diagnosis. Negative for carcinoma.  Pelvic washings - negative for malignancy  8//31/2023-01/30/2022 :Carboplatin (AUC 6) + Paclitaxel (175) q21d X 6 Cycles  04/26/2022 CT C/A/P IMPRESSION: 1. No definitive evidence of metastatic disease. 2. Minimal focal vague haziness in the ventral left lower quadrant, status post omentectomy. No discrete nodules. Recommend attention on follow-up. 3.  Aortic atherosclerosis (ICD10-I70.0). 4.  Emphysema (ICD10-J43.9).  Genetics Assessment: Negative genetic testing. No pathogenic variants identified on the [facility] CancerNext-Expanded+RNA panel. The report date is 10/31/2021. HRD testing through [facility] was also negative and reported out 10/31/2021.   Problem List: Patient Active Problem  List   Diagnosis Date Noted   Genetic testing 11/02/2021   Ovarian cancer on left (HCC) 09/28/2021   Depression, unspecified 09/06/2021   Mild intermittent asthma 09/06/2021   Ovarian mass 08/10/2021   PSVT (paroxysmal supraventricular tachycardia) (HCC) 08/14/2019   Vitamin D deficiency 06/10/2019   Current mild episode of major depressive disorder without prior episode (HCC) 10/15/2018   GAD (generalized anxiety disorder) 10/15/2018   History of colonic polyps    Obesity (BMI 30.0-34.9) 05/29/2018   GERD (gastroesophageal reflux disease) 11/27/2017   Osteopenia 11/27/2017   Mixed hyperlipidemia 12/09/2014   Insomnia due to stress 12/09/2014    Past Medical History: Past Medical History:  Diagnosis Date   Allergy    Arthritis    Asthma    Atypical chest pain 03/14/2019   Atypical mole 06/12/2018   right prox lat thigh/mod   Atypical mole 05/19/2014   left sup med buttock/excision   Atypical mole 03/26/2014   right sup lat buttock   Atypical mole 03/26/2013   left lat mid back   Atypical mole 11/08/2006   right shoulder superior/excision   Basal cell carcinoma 06/12/2016   right medial sup canthus below eyebrow   Basal cell carcinoma 09/23/2014   left nasal bridge   Basal cell carcinoma 03/26/2013   left nasal sidewall   Basal cell carcinoma 08/27/2012   left nasal dorsum   Cancer (HCC)    shoulder, nose skin CA- basal cell CA   GERD (gastroesophageal reflux disease)    occasional   Hyperlipidemia    Osteopenia    Polyp of transverse colon  Right arm weakness 03/14/2019   Stress due to illness of family member 05/29/2018    Past Surgical History: Past Surgical History:  Procedure Laterality Date   BREAST BIOPSY Right    core bx- neg   COLONOSCOPY     COLONOSCOPY WITH PROPOFOL N/A 10/08/2018   Procedure: COLONOSCOPY WITH PROPOFOL;  Surgeon: Midge Minium, MD;  Location: [facility] ENDOSCOPY;  Service: Endoscopy;  Laterality: N/A;   MOHS SURGERY     TONSILLECTOMY      VAGINAL HYSTERECTOMY     Past Gynecologic History:  Menarche: age 82 Hysterectomy at age 6.   OB History:  OB History  No obstetric history on file.   Family History: Family History  Problem Relation Age of Onset   Lung cancer Mother    Diabetes Father    Parkinson's disease Father    Alzheimer's disease Father    Heart attack Father 80   Alcohol abuse Sister    Heart disease Sister    Lung cancer Sister 32   Down syndrome Sister    Colon cancer Paternal Uncle        d. 51s   Melanoma Maternal Grandmother    Stroke Paternal Grandmother    Lung cancer Paternal Grandmother        d. 81s   Stomach cancer Paternal Grandfather        d. 58s   Liver cancer Son 42   Esophageal cancer Neg Hx    Breast cancer Neg Hx     Social History: Social History   Socioeconomic History   Marital status: Married    Spouse name: [name]   Number of children: 2   Years of education: Not on file   Highest education level: Bachelor's degree (e.g., BA, AB, BS)  Occupational History   Occupation: Retired  Tobacco Use   Smoking status: Former    Current packs/day: 0.00    Average packs/day: 1.5 packs/day for 20.0 years (30.0 ttl pk-yrs)    Types: Cigarettes    Start date: 48    Quit date: 1993    Years since quitting: 31.7    Passive exposure: Past   Smokeless tobacco: Never  Vaping Use   Vaping status: Never Used  Substance and Sexual Activity   Alcohol use: Yes    Alcohol/week: 1.0 standard drink of alcohol    Types: 1 Glasses of wine per week    Comment: one glass of wine once a month   Drug use: Never   Sexual activity: Not Currently  Other Topics Concern   Not on file  Social History Narrative   Lives with husband in [place]; quit smoking > 30 years; once every 1-2 months; retd. Teacher- elementary school;    Social Determinants of Health   Financial Resource Strain: Low Risk  (05/09/2022)   Overall Financial Resource Strain (CARDIA)    Difficulty of Paying  Living Expenses: Not hard at all  Food Insecurity: No Food Insecurity (05/09/2022)   Hunger Vital Sign    Worried About Running Out of Food in the Last Year: Never true    Ran Out of Food in the Last Year: Never true  Transportation Needs: No Transportation Needs (05/09/2022)   PRAPARE - Administrator, Civil Service (Medical): No    Lack of Transportation (Non-Medical): No  Physical Activity: Insufficiently Active (05/09/2022)   Exercise Vital Sign    Days of Exercise per Week: 3 days    Minutes of Exercise per Session: 20  min  Stress: Stress Concern Present (05/09/2022)   [employer] of Occupational Health - Occupational Stress Questionnaire    Feeling of Stress : To some extent  Social Connections: Socially Integrated (05/09/2022)   Social Connection and Isolation Panel [NHANES]    Frequency of Communication with Friends and Family: Three times a week    Frequency of Social Gatherings with Friends and Family: Three times a week    Attends Religious Services: More than 4 times per year    Active Member of Clubs or Organizations: Yes    Attends Banker Meetings: More than 4 times per year    Marital Status: Married  Catering manager Violence: Not At Risk (03/31/2022)   Humiliation, Afraid, Rape, and Kick questionnaire    Fear of Current or Ex-Partner: No    Emotionally Abused: No    Physically Abused: No    Sexually Abused: No    Allergies: Allergies  Allergen Reactions   Tetanus Toxoids Shortness Of Breath   Sulfa Antibiotics     Severe vomiting    Current Medications: Current Outpatient Medications  Medication Sig Dispense Refill   acetaminophen (TYLENOL) 325 MG tablet Take 650 mg by mouth every 6 (six) hours as needed.     Ascorbic Acid (VITAMIN C) 1000 MG tablet Take 1,000 mg by mouth daily.     escitalopram (LEXAPRO) 20 MG tablet TAKE 1 TABLET BY MOUTH EVERY DAY 90 tablet 1   famotidine (PEPCID) 10 MG tablet Take 1 tablet (10 mg total) by  mouth 2 (two) times daily as needed for heartburn or indigestion.     hydrOXYzine (ATARAX/VISTARIL) 25 MG tablet TAKE 1-2 TABLETS (25-50 MG TOTAL) BY MOUTH AT BEDTIME AS NEEDED FOR ANXIETY (INSOMNIA). 180 tablet 2   lovastatin (MEVACOR) 20 MG tablet TAKE 1 TABLET BY MOUTH EVERYDAY AT BEDTIME 90 tablet 3   melatonin 3 MG TABS tablet Take 3 mg by mouth at bedtime.     metoprolol succinate (TOPROL-XL) 25 MG 24 hr tablet Take by mouth.     traZODone (DESYREL) 50 MG tablet TAKE 1/2 TABLET TO 2 TABLET BY MOUTH AT BEDTIME AS NEEDED FOR SLEEP 180 tablet 1   vitamin B-12 (CYANOCOBALAMIN) 1000 MCG tablet Take 1,000 mcg by mouth daily.     No current facility-administered medications for this visit.    Review of Systems General: Fatigue otherwise no complaints  HEENT: no complaints  Lungs: no complaints  Cardiac: no complaints  GI: Alternating constipation and diarrhea (chronic) otherwise no complaints  GU: no complaints  Musculoskeletal: no complaints  Extremities: no complaints  Skin: no complaints  Neuro: Peripheral neuropathy otherwise no complaints  Endocrine: no complaints  Psych: no complaints      Objective:  Physical Examination:  BP 134/64 (BP Location: Left Arm, Patient Position: Sitting)   Pulse 72   Temp (!) 97.3 F (36.3 C) (Tympanic)   Wt 178 lb (80.7 kg)   LMP  (LMP Unknown)   SpO2 97%   BMI 30.55 kg/m     ECOG Performance Status: 1 - Symptomatic but completely ambulatory  GENERAL: Patient is a well appearing female in no acute distress HEENT:  Sclera clear. Anicteric NODES:  Negative axillary, supraclavicular, inguinal lymph node survery LUNGS:  Clear to auscultation bilaterally.   HEART:  Regular rate and rhythm.  ABDOMEN:  Soft, nontender.  No hernias, incisions well healed. No masses or ascites EXTREMITIES:  No peripheral edema. Atraumatic. No cyanosis SKIN:  Clear with no obvious rashes  or skin changes.  NEURO:  Nonfocal. Well oriented.  Appropriate  affect.  Pelvic: Exam chaperoned by CMA EGBUS: no lesions.  Vagina: thin green/brown discharge present. Wet prep collected. Appears to be contaminant and not originating from vagina. No bleeding or lesions.  Cervix: surgically absent Uterus: surgically absent BME: no palpable masses Rectovaginal: deferred   Lab Review:  CA 125 per HPI. Today's result pending.    Radiologic Imaging: No imaging on site.     Assessment:  SHARNI NEGRON is a 77 y.o. female with complex 15 cm pelvic mass found to have stage Ic high grade serous cancer of the ovary.  In 7/23 had diagnostic laparoscopy, ex lap, BSO, infracolic omentectomy, peritoneal biopsies, LOA, appendectomy with partial cecectomy (due to frozen section suggesting mucinous ovarian tumor and appendix was adherent to cecum). She had prior hysterectomy. Pathology consistent with stage IC high grade serous ovarian cancer of left ovary based on controlled drainage of the mass.  She received 6 cycles of adjuvant carboplatin-paclitaxel. CA 125 was 526 at diagnosis. CT 04/2022 no definitive recurrence. CA 125 was down to 7.16 July 2022. Clinically, NED today.   Germline panel testing and tumor HRD testing negative.   Medical co-morbidities complicating care: prior vaginal hysterectomy; asthma, and prior skin cancers Plan:   Problem List Items Addressed This Visit       Endocrine   Ovarian cancer on left (HCC)   Relevant Orders   Wet prep, genital (Completed)   Other Visit Diagnoses     Encounter for follow-up surveillance of ovarian cancer    -  Primary      RTC in 3 months for surveillance and pelvic exam with Dr. Sonia Side. She prefers female providers for pelvic exam. Again reviewed symptoms that would be concerning for recurrence. If she has concerning symptoms or if rising CA125, would recommend reevaluation and imaging. We reviewed survivorship topics today and recommendation for surveillance intervals.   Wet Prep was negative.   She  will follow up with her primary care doctor for ongoing wellness, management of other medical comorbidities, and cancer screenings.   The patient's diagnosis, an outline of the further diagnostic and laboratory studies which will be required, the recommendation for surgery, and alternatives were discussed with her and her accompanying family members.  All questions were answered to their satisfaction.  Alinda Dooms, NP

## 2022-11-16 LAB — CA 125: Cancer Antigen (CA) 125: 7 U/mL (ref 0.0–38.1)

## 2022-11-21 ENCOUNTER — Other Ambulatory Visit: Payer: Self-pay | Admitting: Nurse Practitioner

## 2022-11-21 DIAGNOSIS — F320 Major depressive disorder, single episode, mild: Secondary | ICD-10-CM

## 2022-11-21 NOTE — Telephone Encounter (Signed)
Requested Prescriptions  Pending Prescriptions Disp Refills   escitalopram (LEXAPRO) 20 MG tablet [Pharmacy Med Name: ESCITALOPRAM 20 MG TABLET] 90 tablet 0    Sig: TAKE 1 TABLET BY MOUTH EVERY DAY     Psychiatry:  Antidepressants - SSRI Passed - 11/21/2022  1:36 AM      Passed - Completed PHQ-2 or PHQ-9 in the last 360 days      Passed - Valid encounter within last 6 months    Recent Outpatient Visits           3 months ago GAD (generalized anxiety disorder)   [facility] Danelle Berry, PA-C   6 months ago Mixed hyperlipidemia   [facility] Danelle Berry, PA-C   1 year ago Pelvic mass   [facility] Danelle Berry, PA-C   1 year ago Current mild episode of major depressive disorder without prior episode [facility])   [place] Danelle Berry, PA-C   1 year ago Dysuria   [facility] Danelle Berry, PA-C       Future Appointments             In 6 days Danelle Berry, PA-C [facility]   In 2 weeks Deirdre Evener, MD [facility]

## 2022-11-27 ENCOUNTER — Ambulatory Visit: Payer: PPO | Admitting: Family Medicine

## 2022-11-28 ENCOUNTER — Encounter: Payer: Self-pay | Admitting: Physician Assistant

## 2022-11-28 ENCOUNTER — Telehealth: Payer: Self-pay | Admitting: Family Medicine

## 2022-11-28 ENCOUNTER — Ambulatory Visit (INDEPENDENT_AMBULATORY_CARE_PROVIDER_SITE_OTHER): Payer: PPO | Admitting: Physician Assistant

## 2022-11-28 VITALS — BP 118/66 | HR 70 | Temp 97.9°F | Resp 16 | Ht 64.0 in | Wt 180.2 lb

## 2022-11-28 DIAGNOSIS — G4700 Insomnia, unspecified: Secondary | ICD-10-CM | POA: Diagnosis not present

## 2022-11-28 DIAGNOSIS — R5383 Other fatigue: Secondary | ICD-10-CM

## 2022-11-28 DIAGNOSIS — E559 Vitamin D deficiency, unspecified: Secondary | ICD-10-CM

## 2022-11-28 DIAGNOSIS — F411 Generalized anxiety disorder: Secondary | ICD-10-CM

## 2022-11-28 DIAGNOSIS — F320 Major depressive disorder, single episode, mild: Secondary | ICD-10-CM | POA: Diagnosis not present

## 2022-11-28 MED ORDER — TRAZODONE HCL 50 MG PO TABS: 50.0000 mg | ORAL_TABLET | Freq: Every day | ORAL | 1 refills | Status: DC

## 2022-11-28 MED ORDER — HYDROXYZINE HCL 25 MG PO TABS: 25.0000 mg | ORAL_TABLET | Freq: Three times a day (TID) | ORAL | 1 refills | Status: DC | PRN

## 2022-11-28 NOTE — Assessment & Plan Note (Signed)
Recheck labs today Most recently in normal range in march but she has persistent fatigue Results to dictate further management

## 2022-11-28 NOTE — Telephone Encounter (Signed)
Joe, Pharmacist with [facility] in [place] called in regards to a fax request they received for vaccination record for shingles of patient. Per Gabriel Rung, their fax machine is down for a week and per Gabriel Rung, the pharmacist states that patient never received shingles vaccination at their location.   [facility] # [phone]

## 2022-11-28 NOTE — Progress Notes (Unsigned)
Established Patient Office Visit  Name: [name]   MRN: [id number]    DOB: [birth year]   Date:11/29/2022  Today's Provider: Jacquelin Hawking, MHS, PA-C Introduced myself to the patient as a PA-C and provided education on APPs in clinical practice.         Subjective  Chief Complaint  Chief Complaint  Patient presents with   Depression    Pt states still about the same since last OV   Anxiety   Insomnia   Fatigue    HPI   She reports she has been doing okay She reports persistent fatigue but thinks this is secondary to chemo She has a son with stage 4 cancer and her husband has parkinsons'- had leg fracture requiring ORIF  She reports her anxiety is still ongoing due to caregiver stress  Depression/ anxiety  She reports anxiety is up and down in severity  She is still taking Lexapro 20 mg PO She is also taking Trazodone at night  She reports her sleep is okay once she gets to sleep - she has sleep initiation issues. Getting about 6 hours per night  She is taking whole trazodone per night      11/28/2022    1:43 PM 08/23/2022    1:21 PM 05/10/2022    1:46 PM 03/31/2022   11:33 AM 08/24/2021    1:13 PM  Depression screen PHQ 2/9  Decreased Interest 1 1 2  0 2  Down, Depressed, Hopeless 1 1 2  0 2  PHQ - 2 Score 2 2 4  0 4  Altered sleeping 1 1 3  2   Tired, decreased energy 1 1 0  2  Change in appetite 0 1 0  0  Feeling bad or failure about yourself  0 0 1  0  Trouble concentrating 0 0 0  1  Moving slowly or fidgety/restless 0 0 0  0  Suicidal thoughts 0 0 0  0  PHQ-9 Score 4 5 8  9   Difficult doing work/chores Somewhat difficult Not difficult at all Somewhat difficult  Not difficult at all      11/28/2022    1:49 PM 08/23/2022    1:21 PM 05/10/2022    1:47 PM 08/24/2021    1:14 PM  GAD 7 : Generalized Anxiety Score  Nervous, Anxious, on Edge 1 0 2 3  Control/stop worrying 1 0 2 3  Worry too much - different things 1 0 2 3  Trouble relaxing 1 0 2 3   Restless 1 0 0 3  Easily annoyed or irritable 1 0 0 0  Afraid - awful might happen 0 0 2 2  Total GAD 7 Score 6 0 10 17  Anxiety Difficulty Somewhat difficult Not difficult at all Somewhat difficult Not difficult at all     INSOMNIA Duration: years Satisfied with sleep quality: yes Difficulty falling asleep: yes Difficulty staying asleep: no Waking a few hours after sleep onset: no Early morning awakenings: no Daytime hypersomnolence: no Wakes feeling refreshed:  sometimes Good sleep hygiene: yes Apnea: no Snoring: no Depressed/anxious mood: yes Recent stress: yes Restless legs/nocturnal leg cramps: no Chronic pain/arthritis: yes History of sleep study: no Treatments attempted: melatonin   FATIGUE Duration:  chronic Severity: 5/10  Onset: gradual Context when symptoms started:  unknown Symptoms improve with rest: yes  Depressive symptoms: yes Stress/anxiety: yes Insomnia: yes hard to fall asleep Snoring: no Observed apnea by bed partner: no Daytime hypersomnolence:no  Wakes feeling refreshed:  sometimes yes but others does not feel refreshed  History of sleep study: no Dysnea on exertion:  yes- with walking uphill or more moderate exertion  Orthopnea/PND: no Chest pain: no Chronic cough: no Lower extremity edema: no Arthralgias:yes- more severe in hands after chemo  Myalgias: no Weakness: no Rash: no     Patient Active Problem List   Diagnosis Date Noted   Fatigue 11/28/2022   Genetic testing 11/02/2021   Ovarian cancer on left (HCC) 09/28/2021   Depression, unspecified 09/06/2021   Mild intermittent asthma 09/06/2021   Ovarian mass 08/10/2021   PSVT (paroxysmal supraventricular tachycardia) (HCC) 08/14/2019   Vitamin D deficiency 06/10/2019   Current mild episode of major depressive disorder without prior episode (HCC) 10/15/2018   GAD (generalized anxiety disorder) 10/15/2018   History of colonic polyps    Obesity (BMI 30.0-34.9) 05/29/2018    GERD (gastroesophageal reflux disease) 11/27/2017   Osteopenia 11/27/2017   Mixed hyperlipidemia 12/09/2014   Insomnia 12/09/2014    Past Surgical History:  Procedure Laterality Date   BREAST BIOPSY Right    core bx- neg   COLONOSCOPY     COLONOSCOPY WITH PROPOFOL N/A 10/08/2018   Procedure: COLONOSCOPY WITH PROPOFOL;  Surgeon: Midge Minium, MD;  Location: [facility] END[facility]COPY;  Service: Endoscopy;  Laterality: N/A;   MOHS SURGERY     TONSILLECTOMY     VAGINAL HYSTERECTOMY      Family History  Problem Relation Age of Onset   Lung cancer Mother    Diabetes Father    Parkinson's disease Father    Alzheimer's disease Father    Heart attack Father 33   Alcohol abuse Sister    Heart disease Sister    Lung cancer Sister 8   Down syndrome Sister    Colon cancer Paternal Uncle        d. 81s   Melanoma Maternal Grandmother    Stroke Paternal Grandmother    Lung cancer Paternal Grandmother        d. 15s   Stomach cancer Paternal Grandfather        d. 61s   Liver cancer Son 49   Esophageal cancer Neg Hx    Breast cancer Neg Hx     Social History   Tobacco Use   Smoking status: Former    Current packs/day: 0.00    Average packs/day: 1.5 packs/day for 20.0 years (30.0 ttl pk-yrs)    Types: Cigarettes    Start date: 52    Quit date: 25    Years since quitting: 31.8    Passive exposure: Past   Smokeless tobacco: Never  Substance Use Topics   Alcohol use: Yes    Alcohol/week: 1.0 standard drink of alcohol    Types: 1 Glasses of wine per week    Comment: one glass of wine once a month     Current Outpatient Medications:    acetaminophen (TYLENOL) 325 MG tablet, Take 650 mg by mouth every 6 (six) hours as needed., Disp: , Rfl:    Ascorbic Acid (VITAMIN C) 1000 MG tablet, Take 1,000 mg by mouth daily., Disp: , Rfl:    escitalopram (LEXAPRO) 20 MG tablet, TAKE 1 TABLET BY MOUTH EVERY DAY, Disp: 90 tablet, Rfl: 0   famotidine (PEPCID) 10 MG tablet, Take 1 tablet (10 mg  total) by mouth 2 (two) times daily as needed for heartburn or indigestion., Disp: , Rfl:    lovastatin (MEVACOR) 20 MG tablet, TAKE 1 TABLET BY  MOUTH EVERYDAY AT BEDTIME, Disp: 90 tablet, Rfl: 3   melatonin 3 MG TABS tablet, Take 3 mg by mouth at bedtime., Disp: , Rfl:    metoprolol succinate (TOPROL-XL) 25 MG 24 hr tablet, Take by mouth., Disp: , Rfl:    vitamin B-12 (CYANOCOBALAMIN) 1000 MCG tablet, Take 1,000 mcg by mouth daily., Disp: , Rfl:    hydrOXYzine (ATARAX) 25 MG tablet, Take 1 tablet (25 mg total) by mouth every 8 (eight) hours as needed for anxiety (insomnia)., Disp: 90 tablet, Rfl: 1   traZODone (DESYREL) 50 MG tablet, Take 1 tablet (50 mg total) by mouth at bedtime., Disp: 90 tablet, Rfl: 1  Allergies  Allergen Reactions   Tetanus Toxoids Shortness Of Breath   Sulfa Antibiotics     Severe vomiting    I personally reviewed active problem list, medication list, allergies, health maintenance, notes from last encounter, lab results with the patient/caregiver today.   ROS  See HPI for relevant ROS   Objective  Vitals:   11/28/22 1344  BP: 118/66  Pulse: 70  Resp: 16  Temp: 97.9 F (36.6 C)  TempSrc: Oral  SpO2: 96%  Weight: 180 lb 3.2 oz (81.7 kg)  Height: 5\' 4"  (1.626 m)    Body mass index is 30.93 kg/m.  Physical Exam Vitals reviewed.  Constitutional:      Appearance: Normal appearance.  HENT:     Head: Normocephalic and atraumatic.  Cardiovascular:     Rate and Rhythm: Normal rate and regular rhythm.     Pulses: Normal pulses.     Heart sounds: Normal heart sounds. No murmur heard.    No friction rub. No gallop.  Pulmonary:     Effort: Pulmonary effort is normal.     Breath sounds: Normal breath sounds.  Skin:    General: Skin is warm and dry.  Neurological:     General: No focal deficit present.     Mental Status: She is alert and oriented to person, place, and time. Mental status is at baseline.  Psychiatric:        Mood and Affect: Mood  normal.        Behavior: Behavior normal.        Thought Content: Thought content normal.        Judgment: Judgment normal.      Recent Results (from the past 2160 hour(s))  CA 125     Status: None   Collection Time: 11/15/22  9:14 AM  Result Value Ref Range   Cancer Antigen (CA) 125 7.0 0.0 - 38.1 U/mL    Comment: (NOTE) Roche [employer] Electrochemiluminescence Immunoassay (ECLIA) Values obtained with different assay methods or kits cannot be used interchangeably.  Results cannot be interpreted as absolute evidence of the presence or absence of malignant disease. Performed At: [facility] [address] [place], [place] [id number] Jolene Schimke MD NW:[id number]   Wet prep, genital     Status: None   Collection Time: 11/15/22 10:10 AM  Result Value Ref Range   Yeast Wet Prep HPF POC NONE SEEN NONE SEEN   Trich, Wet Prep NONE SEEN NONE SEEN   Clue Cells Wet Prep HPF POC NONE SEEN NONE SEEN   WBC, Wet Prep HPF POC <10 <10   Sperm NONE SEEN     Comment: Performed at [facility], [address] [place], [place] [address]  COMPLETE METABOLIC PANEL WITH GFR     Status: Abnormal   Collection Time: 11/28/22  2:23  PM  Result Value Ref Range   Glucose, Bld 115 (H) 65 - 99 mg/dL    Comment: .            Fasting reference interval . For someone without known diabetes, a glucose value between 100 and 125 mg/dL is consistent with prediabetes and should be confirmed with a follow-up test. .    BUN 14 7 - 25 mg/dL   Creat 6.38 7.56 - 4.33 mg/dL   eGFR 59 (L) > OR = 60 mL/min/1.61m2   BUN/Creatinine Ratio SEE NOTE: 6 - 22 (calc)    Comment:    Not Reported: BUN and Creatinine are within    reference range. .    Sodium 142 135 - 146 mmol/L   Potassium 4.0 3.5 - 5.3 mmol/L   Chloride 104 98 - 110 mmol/L   CO2 29 20 - 32 mmol/L   Calcium 9.4 8.6 - 10.4 mg/dL   Total Protein 6.0 (L) 6.1 - 8.1 g/dL   Albumin 4.0 3.6 - 5.1 g/dL   Globulin 2.0 1.9 - 3.7 g/dL  (calc)   AG Ratio 2.0 1.0 - 2.5 (calc)   Total Bilirubin 0.4 0.2 - 1.2 mg/dL   Alkaline phosphatase (APISO) 65 37 - 153 U/L   AST 17 10 - 35 U/L   ALT 14 6 - 29 U/L  CBC w/Diff/Platelet     Status: Abnormal   Collection Time: 11/28/22  2:23 PM  Result Value Ref Range   WBC 12.8 (H) 3.8 - 10.8 Thousand/uL   RBC 4.22 3.80 - 5.10 Million/uL   Hemoglobin 13.3 11.7 - 15.5 g/dL   HCT 29.5 18.8 - 41.6 %   MCV 97.4 80.0 - 100.0 fL   MCH 31.5 27.0 - 33.0 pg   MCHC 32.4 32.0 - 36.0 g/dL    Comment: For adults, a slight decrease in the calculated MCHC value (in the range of 30 to 32 g/dL) is most likely not clinically significant; however, it should be interpreted with caution in correlation with other red cell parameters and the patient's clinical condition.    RDW 13.7 11.0 - 15.0 %   Platelets 168 140 - 400 Thousand/uL   MPV 10.8 7.5 - 12.5 fL   Neutro Abs 5,466 1,500 - 7,800 cells/uL   Absolute Lymphocytes 6,643 (H) 850 - 3,900 cells/uL   Absolute Monocytes 384 200 - 950 cells/uL   Eosinophils Absolute 243 15 - 500 cells/uL   Basophils Absolute 64 0 - 200 cells/uL   Neutrophils Relative % 42.7 %   Total Lymphocyte 51.9 %   Monocytes Relative 3.0 %   Eosinophils Relative 1.9 %   Basophils Relative 0.5 %  Magnesium     Status: None   Collection Time: 11/28/22  2:23 PM  Result Value Ref Range   Magnesium 1.8 1.5 - 2.5 mg/dL  S06     Status: None   Collection Time: 11/28/22  2:23 PM  Result Value Ref Range   Vitamin B-12 1,000 200 - 1,100 pg/mL  Vitamin D (25 hydroxy)     Status: None   Collection Time: 11/28/22  2:23 PM  Result Value Ref Range   Vit D, 25-Hydroxy 41 30 - 100 ng/mL    Comment: Vitamin D Status         25-OH Vitamin D: . Deficiency:                    <20 ng/mL Insufficiency:  20 - 29 ng/mL Optimal:                 > or = 30 ng/mL . For 25-OH Vitamin D testing on patients on  D2-supplementation and patients for whom quantitation  of D2 and D3  fractions is required, the [employer]ureD(TM) 25-OH VIT D, (D2,D3), LC/MS/MS is recommended: order  code 69629 (patients >74yrs). . See Note 1 . Note 1 . For additional information, please refer to  http://education.Quest[employer].com/faq/FAQ199  (This link is being provided for informational/ educational purposes only.)      PHQ2/9:    11/28/2022    1:43 PM 08/23/2022    1:21 PM 05/10/2022    1:46 PM 03/31/2022   11:33 AM 08/24/2021    1:13 PM  Depression screen PHQ 2/9  Decreased Interest 1 1 2  0 2  Down, Depressed, Hopeless 1 1 2  0 2  PHQ - 2 Score 2 2 4  0 4  Altered sleeping 1 1 3  2   Tired, decreased energy 1 1 0  2  Change in appetite 0 1 0  0  Feeling bad or failure about yourself  0 0 1  0  Trouble concentrating 0 0 0  1  Moving slowly or fidgety/restless 0 0 0  0  Suicidal thoughts 0 0 0  0  PHQ-9 Score 4 5 8  9   Difficult doing work/chores Somewhat difficult Not difficult at all Somewhat difficult  Not difficult at all      Fall Risk:    11/28/2022    1:43 PM 08/23/2022    1:21 PM 05/10/2022    1:46 PM 03/31/2022   11:29 AM 08/24/2021    1:12 PM  Fall Risk   Falls in the past year? 1 1 0 0 0  Number falls in past yr: 1 1 0 0 0  Injury with Fall? 0 0 0 0 0  Risk for fall due to : Impaired balance/gait  No Fall Risks No Fall Risks No Fall Risks  Follow up Falls prevention discussed;Education provided;Falls evaluation completed  Falls prevention discussed;Education provided;Falls evaluation completed Falls prevention discussed;Follow up appointment Falls prevention discussed;Education provided      Functional Status Survey: Is the patient deaf or have difficulty hearing?: No Does the patient have difficulty seeing, even when wearing glasses/contacts?: No Does the patient have difficulty concentrating, remembering, or making decisions?: No Does the patient have difficulty walking or climbing stairs?: No Does the patient have difficulty dressing or bathing?:  No Does the patient have difficulty doing errands alone such as visiting a doctor's office or shopping?: No    Assessment & Plan  Problem List Items Addressed This Visit       Other   Insomnia    Chronic, ongoing condition Patient is currently taking trazodone 50 mg p.o. nightly and appears to be tolerating well She does report persistent sleep initiation issues but denies sleep maintenance problems Recommend that she continues with trazodone for now.  Will try adding hydroxyzine as needed for anxiety which may also benefit sleep We reviewed that hydroxyzine can be sedating and if taken alongside trazodone will likely cause increased drowsiness Reviewed safety measures regarding concurrent use Recommend follow-up in about 6 weeks or sooner if concerns arise      Current mild episode of major depressive disorder without prior episode (HCC) - Primary    Chronic, historic condition PHQ-9 appears mild today She is currently taking Lexapro 20 mg p.o. daily and appear to be  tolerating well Continue current regimen Follow-up in 6 weeks or sooner if concerns arise as we are initiating hydroxyzine for anxiety management      Relevant Medications   traZODone (DESYREL) 50 MG tablet   hydrOXYzine (ATARAX) 25 MG tablet   GAD (generalized anxiety disorder)    Chronic, ongoing Reviewed PHQ-9 as well as GAD-7 with her today She is currently taking Lexapro 20 mg p.o. daily and appears to be tolerating well She does report that anxiety can be fluctuating in severity Will restart hydroxyzine p.o. 3 times daily as needed to assist with anxiety flares as well as sleep Recommend follow-up in 6 weeks or sooner if concerns arise      Relevant Medications   traZODone (DESYREL) 50 MG tablet   hydrOXYzine (ATARAX) 25 MG tablet   Vitamin D deficiency    Recheck labs today Most recently in normal range in march but she has persistent fatigue Results to dictate further management        Relevant  Orders   Vitamin D (25 hydroxy) (Completed)   Fatigue    Appears to be chronic, ongoing Unsure if this is related to her chronic insomnia versus leftover symptoms from chemotherapy Recheck labs today Results to dictate further management Follow-up in 6 weeks or sooner if concerns arise      Relevant Orders   COMPLETE METABOLIC PANEL WITH GFR (Completed)   CBC w/Diff/Platelet (Completed)   Magnesium (Completed)   B12 (Completed)   Vitamin D (25 hydroxy) (Completed)     Return in about 6 weeks (around 01/09/2023) for anxiety, Depression, insomnia.   I, Angelito Hopping E Trevontae Lindahl, PA-C, have reviewed all documentation for this visit. The documentation on 11/29/22 for the exam, diagnosis, procedures, and orders are all accurate and complete.   Jacquelin Hawking, MHS, PA-C [facility]

## 2022-11-29 ENCOUNTER — Encounter: Payer: Self-pay | Admitting: Internal Medicine

## 2022-11-29 DIAGNOSIS — M65341 Trigger finger, right ring finger: Secondary | ICD-10-CM | POA: Diagnosis not present

## 2022-11-29 DIAGNOSIS — M65342 Trigger finger, left ring finger: Secondary | ICD-10-CM | POA: Diagnosis not present

## 2022-11-29 LAB — MAGNESIUM: Magnesium: 1.8 mg/dL (ref 1.5–2.5)

## 2022-11-29 LAB — CBC WITH DIFFERENTIAL/PLATELET
Absolute Lymphocytes: 6643 {cells}/uL — ABNORMAL HIGH (ref 850–3900)
Absolute Monocytes: 384 {cells}/uL (ref 200–950)
Basophils Absolute: 64 {cells}/uL (ref 0–200)
Basophils Relative: 0.5 %
Eosinophils Absolute: 243 {cells}/uL (ref 15–500)
Eosinophils Relative: 1.9 %
HCT: 41.1 % (ref 35.0–45.0)
Hemoglobin: 13.3 g/dL (ref 11.7–15.5)
MCH: 31.5 pg (ref 27.0–33.0)
MCHC: 32.4 g/dL (ref 32.0–36.0)
MCV: 97.4 fL (ref 80.0–100.0)
MPV: 10.8 fL (ref 7.5–12.5)
Monocytes Relative: 3 %
Neutro Abs: 5466 {cells}/uL (ref 1500–7800)
Neutrophils Relative %: 42.7 %
Platelets: 168 10*3/uL (ref 140–400)
RBC: 4.22 10*6/uL (ref 3.80–5.10)
RDW: 13.7 % (ref 11.0–15.0)
Total Lymphocyte: 51.9 %
WBC: 12.8 10*3/uL — ABNORMAL HIGH (ref 3.8–10.8)

## 2022-11-29 LAB — COMPLETE METABOLIC PANEL WITH GFR
AG Ratio: 2 (calc) (ref 1.0–2.5)
ALT: 14 U/L (ref 6–29)
AST: 17 U/L (ref 10–35)
Albumin: 4 g/dL (ref 3.6–5.1)
Alkaline phosphatase (APISO): 65 U/L (ref 37–153)
BUN: 14 mg/dL (ref 7–25)
CO2: 29 mmol/L (ref 20–32)
Calcium: 9.4 mg/dL (ref 8.6–10.4)
Chloride: 104 mmol/L (ref 98–110)
Creat: 0.98 mg/dL (ref 0.60–1.00)
Globulin: 2 g/dL (ref 1.9–3.7)
Glucose, Bld: 115 mg/dL — ABNORMAL HIGH (ref 65–99)
Potassium: 4 mmol/L (ref 3.5–5.3)
Sodium: 142 mmol/L (ref 135–146)
Total Bilirubin: 0.4 mg/dL (ref 0.2–1.2)
Total Protein: 6 g/dL — ABNORMAL LOW (ref 6.1–8.1)
eGFR: 59 mL/min/{1.73_m2} — ABNORMAL LOW (ref >=60)

## 2022-11-29 LAB — VITAMIN B12: Vitamin B-12: 1000 pg/mL (ref 200–1100)

## 2022-11-29 LAB — VITAMIN D 25 HYDROXY (VIT D DEFICIENCY, FRACTURES): Vit D, 25-Hydroxy: 41 ng/mL (ref 30–100)

## 2022-11-29 NOTE — Progress Notes (Signed)
Your labs are back Your electrolytes are overall in normal ranges.  Your kidney function appears to be a bit decreased compared to previous testing Please make sure that you are staying well-hydrated to prevent further kidney dysfunction Your CBC was overall normal with the exception of your white blood cells.  These were elevated which is sometimes a sign of infection Please let us know if you have been having any symptoms of a cold, UTI, fever, chills so we can evaluate further Your magnesium, B12, vitamin D were all in normal range Please let us know if you have further questions or concerns

## 2022-11-29 NOTE — Assessment & Plan Note (Signed)
Chronic, historic condition PHQ-9 appears mild today She is currently taking Lexapro 20 mg p.o. daily and appear to be tolerating well Continue current regimen Follow-up in 6 weeks or sooner if concerns arise as we are initiating hydroxyzine for anxiety management

## 2022-11-29 NOTE — Assessment & Plan Note (Signed)
Chronic, ongoing Reviewed PHQ-9 as well as GAD-7 with her today She is currently taking Lexapro 20 mg p.o. daily and appears to be tolerating well She does report that anxiety can be fluctuating in severity Will restart hydroxyzine p.o. 3 times daily as needed to assist with anxiety flares as well as sleep Recommend follow-up in 6 weeks or sooner if concerns arise

## 2022-11-29 NOTE — Assessment & Plan Note (Signed)
Chronic, ongoing condition Patient is currently taking trazodone 50 mg p.o. nightly and appears to be tolerating well She does report persistent sleep initiation issues but denies sleep maintenance problems Recommend that she continues with trazodone for now.  Will try adding hydroxyzine as needed for anxiety which may also benefit sleep We reviewed that hydroxyzine can be sedating and if taken alongside trazodone will likely cause increased drowsiness Reviewed safety measures regarding concurrent use Recommend follow-up in about 6 weeks or sooner if concerns arise

## 2022-11-29 NOTE — Assessment & Plan Note (Addendum)
Appears to be chronic, ongoing Unsure if this is related to her chronic insomnia versus leftover symptoms from chemotherapy Recheck labs today Results to dictate further management Follow-up in 6 weeks or sooner if concerns arise

## 2022-12-06 ENCOUNTER — Encounter: Payer: Self-pay | Admitting: Dermatology

## 2022-12-06 ENCOUNTER — Ambulatory Visit: Payer: PPO | Admitting: Dermatology

## 2022-12-06 DIAGNOSIS — Z86018 Personal history of other benign neoplasm: Secondary | ICD-10-CM

## 2022-12-06 DIAGNOSIS — W908XXA Exposure to other nonionizing radiation, initial encounter: Secondary | ICD-10-CM

## 2022-12-06 DIAGNOSIS — D1801 Hemangioma of skin and subcutaneous tissue: Secondary | ICD-10-CM | POA: Diagnosis not present

## 2022-12-06 DIAGNOSIS — Z1283 Encounter for screening for malignant neoplasm of skin: Secondary | ICD-10-CM

## 2022-12-06 DIAGNOSIS — D229 Melanocytic nevi, unspecified: Secondary | ICD-10-CM

## 2022-12-06 DIAGNOSIS — L814 Other melanin hyperpigmentation: Secondary | ICD-10-CM | POA: Diagnosis not present

## 2022-12-06 DIAGNOSIS — L578 Other skin changes due to chronic exposure to nonionizing radiation: Secondary | ICD-10-CM | POA: Diagnosis not present

## 2022-12-06 DIAGNOSIS — L821 Other seborrheic keratosis: Secondary | ICD-10-CM

## 2022-12-06 DIAGNOSIS — Z85828 Personal history of other malignant neoplasm of skin: Secondary | ICD-10-CM

## 2022-12-06 DIAGNOSIS — L820 Inflamed seborrheic keratosis: Secondary | ICD-10-CM | POA: Diagnosis not present

## 2022-12-06 NOTE — Patient Instructions (Addendum)
Cryotherapy Aftercare  Wash gently with soap and water everyday.   Apply Vaseline Jelly daily until healed.    Recommend daily broad spectrum sunscreen SPF 30+ to sun-exposed areas, reapply every 2 hours as needed. Call for new or changing lesions.  Staying in the shade or wearing long sleeves, sun glasses (UVA+UVB protection) and wide brim hats (4-inch brim around the entire circumference of the hat) are also recommended for sun protection.     Seborrheic Keratosis  What causes seborrheic keratoses? Seborrheic keratoses are harmless, common skin growths that first appear during adult life.  As time goes by, more growths appear.  Some people may develop a large number of them.  Seborrheic keratoses appear on both covered and uncovered body parts.  They are not caused by sunlight.  The tendency to develop seborrheic keratoses can be inherited.  They vary in color from skin-colored to gray, brown, or even black.  They can be either smooth or have a rough, warty surface.   Seborrheic keratoses are superficial and look as if they were stuck on the skin.  Under the microscope this type of keratosis looks like layers upon layers of skin.  That is why at times the top layer may seem to fall off, but the rest of the growth remains and re-grows.    Treatment Seborrheic keratoses do not need to be treated, but can easily be removed in the office.  Seborrheic keratoses often cause symptoms when they rub on clothing or jewelry.  Lesions can be in the way of shaving.  If they become inflamed, they can cause itching, soreness, or burning.  Removal of a seborrheic keratosis can be accomplished by freezing, burning, or surgery. If any spot bleeds, scabs, or grows rapidly, please return to have it checked, as these can be an indication of a skin cancer.     Melanoma ABCDEs  Melanoma is the most dangerous type of skin cancer, and is the leading cause of death from skin disease.  You are more likely to  develop melanoma if you: Have light-colored skin, light-colored eyes, or red or blond hair Spend a lot of time in the sun Tan regularly, either outdoors or in a tanning bed Have had blistering sunburns, especially during childhood Have a close family member who has had a melanoma Have atypical moles or large birthmarks  Early detection of melanoma is key since treatment is typically straightforward and cure rates are extremely high if we catch it early.   The first sign of melanoma is often a change in a mole or a new dark spot.  The ABCDE system is a way of remembering the signs of melanoma.  A for asymmetry:  The two halves do not match. B for border:  The edges of the growth are irregular. C for color:  A mixture of colors are present instead of an even brown color. D for diameter:  Melanomas are usually (but not always) greater than 6mm - the size of a pencil eraser. E for evolution:  The spot keeps changing in size, shape, and color.  Please check your skin once per month between visits. You can use a small mirror in front and a large mirror behind you to keep an eye on the back side or your body.   If you see any new or changing lesions before your next follow-up, please call to schedule a visit.  Please continue daily skin protection including broad spectrum sunscreen SPF 30+ to sun-exposed areas, reapplying  every 2 hours as needed when you're outdoors.   Staying in the shade or wearing long sleeves, sun glasses (UVA+UVB protection) and wide brim hats (4-inch brim around the entire circumference of the hat) are also recommended for sun protection.     Due to recent changes in healthcare laws, you may see results of your pathology and/or laboratory studies on MyChart before the doctors have had a chance to review them. We understand that in some cases there may be results that are confusing or concerning to you. Please understand that not all results are received at the same time and  often the doctors may need to interpret multiple results in order to provide you with the best plan of care or course of treatment. Therefore, we ask that you please give [place] 2 business days to thoroughly review all your results before contacting the office for clarification. Should we see a critical lab result, you will be contacted sooner.   If You Need Anything After Your Visit  If you have any questions or concerns for your doctor, please call our main line at [phone] and press option 4 to reach your doctor's medical assistant. If no one answers, please leave a voicemail as directed and we will return your call as soon as possible. Messages left after 4 pm will be answered the following business day.   You may also send [place] a message via MyChart. We typically respond to MyChart messages within 1-2 business days.  For prescription refills, please ask your pharmacy to contact our office. Our fax number is [phone].  If you have an urgent issue when the clinic is closed that cannot wait until the next business day, you can page your doctor at the number below.    Please note that while we do our best to be available for urgent issues outside of office hours, we are not available 24/7.   If you have an urgent issue and are unable to reach [place], you may choose to seek medical care at your doctor's office, retail clinic, urgent care center, or emergency room.  If you have a medical emergency, please immediately call 911 or go to the emergency department.  Pager Numbers  - Dr. Gwen Pounds: [phone]  - Dr. Roseanne Reno: [phone]  - Dr. Katrinka Blazing: [phone]   In the event of inclement weather, please call our main line at [phone] for an update on the status of any delays or closures.  Dermatology Medication Tips: Please keep the boxes that topical medications come in in order to help keep track of the instructions about where and how to use these. Pharmacies typically print the  medication instructions only on the boxes and not directly on the medication tubes.   If your medication is too expensive, please contact our office at [phone] option 4 or send [place] a message through MyChart.   We are unable to tell what your co-pay for medications will be in advance as this is different depending on your insurance coverage. However, we may be able to find a substitute medication at lower cost or fill out paperwork to get insurance to cover a needed medication.   If a prior authorization is required to get your medication covered by your insurance company, please allow [place] 1-2 business days to complete this process.  Drug prices often vary depending on where the prescription is filled and some pharmacies may offer cheaper prices.  The website www.goodrx.com contains coupons for medications through different pharmacies. The prices here  do not account for what the cost may be with help from insurance (it may be cheaper with your insurance), but the website can give you the price if you did not use any insurance.  - You can print the associated coupon and take it with your prescription to the pharmacy.  - You may also stop by our office during regular business hours and pick up a GoodRx coupon card.  - If you need your prescription sent electronically to a different pharmacy, notify our office through [facility] or by phone at [phone] option 4.     Si Usted Necesita Algo Despus de Su Visita  Tambin puede enviarnos un mensaje a travs de Clinical cytogeneticist. Por lo general respondemos a los mensajes de MyChart en el transcurso de 1 a 2 das hbiles.  Para renovar recetas, por favor pida a su farmacia que se ponga en contacto con nuestra oficina. Annie Sable de fax es [place] [phone].  Si tiene un asunto urgente cuando la clnica est cerrada y que no puede esperar hasta el siguiente da hbil, puede llamar/localizar a su doctor(a) al nmero que aparece a continuacin.    Por favor, tenga en cuenta que aunque hacemos todo lo posible para estar disponibles para asuntos urgentes fuera del horario de [place], no estamos disponibles las 24 horas del da, los 7 [address]  [address] de la [place].   Si tiene un problema urgente y no puede comunicarse con nosotros, puede optar por buscar atencin mdica  en el consultorio de su doctor(a), en una clnica privada, en un centro de atencin urgente o en una sala de emergencias.  Si tiene Engineer, drilling, por favor llame inmediatamente al 911 o vaya a la sala de emergencias.  Nmeros de bper  - Dr. Gwen Pounds: [phone]  - Dra. Roseanne Reno: [phone]  - Dr. Katrinka Blazing: [phone]   En caso de inclemencias del tiempo, por favor llame a Lacy Duverney principal al [phone] para una actualizacin sobre el [place] de cualquier retraso o cierre.  Consejos para la medicacin en dermatologa: Por favor, guarde las cajas en las que vienen los medicamentos de uso tpico para ayudarle a seguir las instrucciones sobre dnde y cmo usarlos. Las farmacias generalmente imprimen las instrucciones del medicamento slo en las cajas y no directamente en los tubos del [place].   Si su medicamento es muy caro, por favor, pngase en contacto con Rolm Gala llamando al [phone] y presione la opcin 4 o envenos un mensaje a travs de Clinical cytogeneticist.   No podemos decirle cul ser su copago por los medicamentos por adelantado ya que esto es diferente dependiendo de la cobertura de su seguro. Sin embargo, es posible que podamos encontrar un medicamento sustituto a Audiological scientist un formulario para que el seguro cubra el medicamento que se considera necesario.   Si se requiere una autorizacin previa para que su compaa de seguros [place] su medicamento, por favor permtanos de 1 a 2 das hbiles para completar [address]  Los precios de los medicamentos varan con frecuencia dependiendo del Environmental consultant de dnde se surte la receta y alguna  farmacias pueden ofrecer precios ms baratos.  El sitio web www.goodrx.com tiene cupones para medicamentos de Health and safety inspector. Los precios aqu no tienen en cuenta lo que podra costar con la ayuda del seguro (puede ser ms barato con su seguro), pero el sitio web puede darle el precio si no utiliz Tourist information centre manager.  - Puede imprimir el cupn correspondiente y llevarlo con su receta a la  farmacia.  - Tambin puede pasar por nuestra oficina durante el horario de atencin regular y Education officer, museum una tarjeta de cupones de GoodRx.  - Si necesita que su receta se enve electrnicamente a una farmacia diferente, informe a nuestra oficina a travs de MyChart de [place] o por telfono llamando al [phone] y presione la opcin 4.

## 2022-12-06 NOTE — Progress Notes (Signed)
Follow-Up Visit   Subjective  [name] is a 77 y.o. female who presents for the following: Skin Cancer Screening and Full Body Skin Exam. Hx of BCCs. Hx of dysplastic nevi.  Spot on right arm. Itches at times. Patient picks at. Dur: 6 months.   The patient presents for Total-Body Skin Exam (TBSE) for skin cancer screening and mole check. The patient has spots, moles and lesions to be evaluated, some may be new or changing and the patient may have concern these could be cancer.    The following portions of the chart were reviewed this encounter and updated as appropriate: medications, allergies, medical history  Review of Systems:  No other skin or systemic complaints except as noted in HPI or Assessment and Plan.  Objective  Well appearing patient in no apparent distress; mood and affect are within normal limits.  A full examination was performed including scalp, head, eyes, ears, nose, lips, neck, chest, axillae, abdomen, back, buttocks, bilateral upper extremities, bilateral lower extremities, hands, feet, fingers, toes, fingernails, and toenails. All findings within normal limits unless otherwise noted below.   Relevant physical exam findings are noted in the Assessment and Plan.  Right forearm x2 (2) Erythematous keratotic or waxy stuck-on papule or plaque.    Assessment & Plan    HISTORY OF BASAL CELL CARCINOMA OF THE SKIN - No evidence of recurrence today - Recommend regular full body skin exams - Recommend daily broad spectrum sunscreen SPF 30+ to sun-exposed areas, reapply every 2 hours as needed.  - Call if any new or changing lesions are noted between office visits   HISTORY OF DYSPLASTIC NEVI No evidence of recurrence today Recommend regular full body skin exams Recommend daily broad spectrum sunscreen SPF 30+ to sun-exposed areas, reapply every 2 hours as needed.  Call if any new or changing lesions are noted between office visits   SKIN CANCER  SCREENING PERFORMED TODAY.  ACTINIC DAMAGE - Chronic condition, secondary to cumulative UV/sun exposure - diffuse scaly erythematous macules with underlying dyspigmentation - Recommend daily broad spectrum sunscreen SPF 30+ to sun-exposed areas, reapply every 2 hours as needed.  - Staying in the shade or wearing long sleeves, sun glasses (UVA+UVB protection) and wide brim hats (4-inch brim around the entire circumference of the hat) are also recommended for sun protection.  - Call for new or changing lesions.  LENTIGINES, SEBORRHEIC KERATOSES, HEMANGIOMAS - Benign normal skin lesions - Benign-appearing - Call for any changes  MELANOCYTIC NEVI - Tan-brown and/or pink-flesh-colored symmetric macules and papules - Benign appearing on exam today - Observation - Call clinic for new or changing moles - Recommend daily use of broad spectrum spf 30+ sunscreen to sun-exposed areas.       Inflamed seborrheic keratosis (2) Right forearm x2  Symptomatic, irritating, patient would like treated.  Destruction of lesion - Right forearm x2 (2) Complexity: simple   Destruction method: cryotherapy   Informed consent: discussed and consent obtained   Timeout:  patient name, date of birth, surgical site, and procedure verified Lesion destroyed using liquid nitrogen: Yes   Region frozen until ice ball extended beyond lesion: Yes   Outcome: patient tolerated procedure well with no complications   Post-procedure details: wound care instructions given   Additional details:  Prior to procedure, discussed risks of blister formation, small wound, skin dyspigmentation, or rare scar following cryotherapy. Recommend Vaseline ointment to treated areas while healing.    Return in about 1 year (around 12/06/2023) for  TBSE, HxBCCs, HxDN.  ***  Documentation: I have reviewed the above documentation for accuracy and completeness, and I agree with the above.  Armida Sans, MD

## 2022-12-07 ENCOUNTER — Other Ambulatory Visit: Payer: Self-pay

## 2022-12-21 ENCOUNTER — Other Ambulatory Visit: Payer: Self-pay | Admitting: Physician Assistant

## 2022-12-21 DIAGNOSIS — F411 Generalized anxiety disorder: Secondary | ICD-10-CM

## 2022-12-22 NOTE — Telephone Encounter (Signed)
Requested Prescriptions  Refused Prescriptions Disp Refills   hydrOXYzine (ATARAX) 25 MG tablet [Pharmacy Med Name: HYDROXYZINE HCL 25 MG TABLET] 270 tablet 1    Sig: TAKE 1 TABLET (25 MG TOTAL) BY MOUTH EVERY 8 (EIGHT) HOURS AS NEEDED FOR ANXIETY (INSOMNIA).     Ear, Nose, and Throat:  Antihistamines 2 Passed - 12/21/2022  2:32 PM      Passed - Cr in normal range and within 360 days    Creat  Date Value Ref Range Status  11/28/2022 0.98 0.60 - 1.00 mg/dL Final         Passed - Valid encounter within last 12 months    Recent Outpatient Visits           3 weeks ago Current mild episode of major depressive disorder without prior episode [facility])   [facility] Mecum, Erin E, PA-C   4 months ago GAD (generalized anxiety disorder)   [facility] Danelle Berry, PA-C   7 months ago Mixed hyperlipidemia   [facility] Danelle Berry, PA-C   1 year ago Pelvic mass   [facility] Danelle Berry, PA-C   1 year ago Current mild episode of major depressive disorder without prior episode [facility])   [facility] Danelle Berry, PA-C       Future Appointments             In 2 weeks Mecum, Oswaldo Conroy, PA-C [facility]   In 11 months Deirdre Evener, MD [facility]

## 2023-01-09 ENCOUNTER — Ambulatory Visit (INDEPENDENT_AMBULATORY_CARE_PROVIDER_SITE_OTHER): Payer: PPO | Admitting: Physician Assistant

## 2023-01-09 ENCOUNTER — Encounter: Payer: Self-pay | Admitting: Physician Assistant

## 2023-01-09 VITALS — BP 114/68 | HR 62 | Resp 16 | Ht 64.0 in | Wt 179.8 lb

## 2023-01-09 DIAGNOSIS — F320 Major depressive disorder, single episode, mild: Secondary | ICD-10-CM | POA: Diagnosis not present

## 2023-01-09 DIAGNOSIS — F5105 Insomnia due to other mental disorder: Secondary | ICD-10-CM

## 2023-01-09 DIAGNOSIS — F330 Major depressive disorder, recurrent, mild: Secondary | ICD-10-CM | POA: Diagnosis not present

## 2023-01-09 DIAGNOSIS — F411 Generalized anxiety disorder: Secondary | ICD-10-CM | POA: Diagnosis not present

## 2023-01-09 DIAGNOSIS — F99 Mental disorder, not otherwise specified: Secondary | ICD-10-CM

## 2023-01-09 MED ORDER — BUSPIRONE HCL 5 MG PO TABS
ORAL_TABLET | ORAL | 1 refills | Status: DC
Start: 2023-01-09 — End: 2023-03-07

## 2023-01-09 NOTE — Patient Instructions (Addendum)
Please remember that you can take the Hydroxyzine as needed up to every 6 hours for mood Start taking the Buspar 5 mg once in the AM with your Lexapro. You can take a second dose for more severe symptoms later in the day or use your Hydroxyzine for these symptoms per your preference.

## 2023-01-09 NOTE — Assessment & Plan Note (Signed)
Chronic, ongoing Reviewed PHQ-9 results with her today which is a 6 compared to 4 last month Recommend she continues to take Lexapro 20 mg p.o. daily and will add BuSpar 5 mg.  Recommend that she takes at least once per day and can take second dose as needed for exacerbated symptoms or she can use hydroxyzine 25 mg up to 3 times a day as needed Reviewed that we should continue to work on sleep. Given that her symptoms are likely reactionary to family stressors she may benefit from therapy.  Will try to discuss this at next appointment Follow-up in about 6 weeks or sooner if concerns arise

## 2023-01-09 NOTE — Assessment & Plan Note (Signed)
Chronic, historic condition Ongoing and appears exacerbated according to GAD-7 results.  GAD-7 is 12 today compared to score of 6 about a month ago Continue Lexapro 20 mg p.o. daily.  Will try adding BuSpar 5 mg p.o. twice daily as needed.  Reviewed that this medication would likely need to have semistable state to prevent side effects.  Recommend that she takes at least once a day in the morning and can take second dose as needed throughout the day or she can proceed with taking hydroxyzine as needed Patient voiced agreement and understanding with current plan Follow-up in about 6 weeks to assess response

## 2023-01-09 NOTE — Assessment & Plan Note (Signed)
Chronic, ongoing Patient is currently taking trazodone 50 mg p.o. nightly.  She reports that this does help her with relaxing and feeling more calm which assist with sleep initiation.  She reports that she does continue to have some sleep initiation issues but when she does fall asleep she is able to stay asleep Continue with trazodone at current dosing for now.  Will try adding BuSpar to assist with anxiety which will likely also benefit racing thoughts at night Follow-up in about 6 weeks or sooner if concerns arise

## 2023-01-09 NOTE — Progress Notes (Signed)
Established Patient Office Visit  Name: [name]   MRN: [id number]    DOB: 10/16/1945   Date:01/09/2023  Today's Provider: Jacquelin Hawking, MHS, PA-C Introduced myself to the patient as a PA-C and provided education on APPs in clinical practice.         Subjective  Chief Complaint  Chief Complaint  Patient presents with   Depression    About the same   Anxiety   Insomnia    Comes/goes hydroxyzine helps    HPI  Depression/ Anxiety   She reports she is still having caregiver stress regarding her family   She reports her younger sister is in [place] and has down syndrome and dementia - dementia seems to be getting worse  Her son has stage 4 liver cancer - has scan coming up   She reports her mood has been up and down a lot lately due to these developments She is not taking Hydroxyzine daily- uses it once in the AM to help if she notes more severe mood issues but is not using more than this   Sleep: some nights she sleeps very well, some nights there is a struggle to fall asleep She reports once she falls asleep she stays asleep  She is still using Trazodone- reports it helps her feel calm       01/09/2023   10:02 AM 11/28/2022    1:43 PM 08/23/2022    1:21 PM 05/10/2022    1:46 PM 03/31/2022   11:33 AM  Depression screen PHQ 2/9  Decreased Interest 1 1 1 2  0  Down, Depressed, Hopeless 1 1 1 2  0  PHQ - 2 Score 2 2 2 4  0  Altered sleeping 1 1 1 3    Tired, decreased energy 1 1 1  0   Change in appetite 1 0 1 0   Feeling bad or failure about yourself  0 0 0 1   Trouble concentrating 1 0 0 0   Moving slowly or fidgety/restless 0 0 0 0   Suicidal thoughts 0 0 0 0   PHQ-9 Score 6 4 5 8    Difficult doing work/chores Somewhat difficult Somewhat difficult Not difficult at all Somewhat difficult        01/09/2023   10:02 AM 11/28/2022    1:49 PM 08/23/2022    1:21 PM 05/10/2022    1:47 PM  GAD 7 : Generalized Anxiety Score  Nervous, Anxious, on Edge 2 1  0 2  Control/stop worrying 2 1 0 2  Worry too much - different things 2 1 0 2  Trouble relaxing 2 1 0 2  Restless 2 1 0 0  Easily annoyed or irritable 0 1 0 0  Afraid - awful might happen 2 0 0 2  Total GAD 7 Score 12 6 0 10  Anxiety Difficulty Somewhat difficult Somewhat difficult Not difficult at all Somewhat difficult        Patient Active Problem List   Diagnosis Date Noted   Fatigue 11/28/2022   Genetic testing 11/02/2021   Ovarian cancer on left (HCC) 09/28/2021   Depression, unspecified 09/06/2021   Mild intermittent asthma 09/06/2021   Ovarian mass 08/10/2021   PSVT (paroxysmal supraventricular tachycardia) (HCC) 08/14/2019   Vitamin D deficiency 06/10/2019   Current mild episode of major depressive disorder without prior episode (HCC) 10/15/2018   GAD (generalized anxiety disorder) 10/15/2018   History of colonic polyps    Obesity (BMI  30.0-34.9) 05/29/2018   GERD (gastroesophageal reflux disease) 11/27/2017   Osteopenia 11/27/2017   Mixed hyperlipidemia 12/09/2014   Insomnia 12/09/2014    Past Surgical History:  Procedure Laterality Date   BREAST BIOPSY Right    core bx- neg   COLONOSCOPY     COLONOSCOPY WITH PROPOFOL N/A 10/08/2018   Procedure: COLONOSCOPY WITH PROPOFOL;  Surgeon: Midge Minium, MD;  [facility] END[facility]COPY;  Service: Endoscopy;  Laterality: N/A;   MOHS SURGERY     TONSILLECTOMY     VAGINAL HYSTERECTOMY      Family History  Problem Relation Age of Onset   Lung cancer Mother    Diabetes Father    Parkinson's disease Father    Alzheimer's disease Father    Heart attack Father 20   Alcohol abuse Sister    Heart disease Sister    Lung cancer Sister 37   Down syndrome Sister    Colon cancer Paternal Uncle        d. 64s   Melanoma Maternal Grandmother    Stroke Paternal Grandmother    Lung cancer Paternal Grandmother        d. 34s   Stomach cancer Paternal Grandfather        d. 13s   Liver cancer Son 68   Esophageal cancer Neg Hx     Breast cancer Neg Hx     Social History   Tobacco Use   Smoking status: Former    Current packs/day: 0.00    Average packs/day: 1.5 packs/day for 20.0 years (30.0 ttl pk-yrs)    Types: Cigarettes    Start date: 81    Quit date: 65    Years since quitting: 31.9    Passive exposure: Past   Smokeless tobacco: Never  Substance Use Topics   Alcohol use: Yes    Alcohol/week: 1.0 standard drink of alcohol    Types: 1 Glasses of wine per week    Comment: one glass of wine once a month     Current Outpatient Medications:    acetaminophen (TYLENOL) 325 MG tablet, Take 650 mg by mouth every 6 (six) hours as needed., Disp: , Rfl:    Ascorbic Acid (VITAMIN C) 1000 MG tablet, Take 1,000 mg by mouth daily., Disp: , Rfl:    busPIRone (BUSPAR) 5 MG tablet, Take one tablet in the AM and then you can take a second as needed during the day, Disp: 60 tablet, Rfl: 1   escitalopram (LEXAPRO) 20 MG tablet, TAKE 1 TABLET BY MOUTH EVERY DAY, Disp: 90 tablet, Rfl: 0   famotidine (PEPCID) 10 MG tablet, Take 1 tablet (10 mg total) by mouth 2 (two) times daily as needed for heartburn or indigestion., Disp: , Rfl:    hydrOXYzine (ATARAX) 25 MG tablet, Take 1 tablet (25 mg total) by mouth every 8 (eight) hours as needed for anxiety (insomnia)., Disp: 90 tablet, Rfl: 1   lovastatin (MEVACOR) 20 MG tablet, TAKE 1 TABLET BY MOUTH EVERYDAY AT BEDTIME, Disp: 90 tablet, Rfl: 3   melatonin 3 MG TABS tablet, Take 3 mg by mouth at bedtime., Disp: , Rfl:    metoprolol succinate (TOPROL-XL) 25 MG 24 hr tablet, Take by mouth., Disp: , Rfl:    traZODone (DESYREL) 50 MG tablet, Take 1 tablet (50 mg total) by mouth at bedtime., Disp: 90 tablet, Rfl: 1   vitamin B-12 (CYANOCOBALAMIN) 1000 MCG tablet, Take 1,000 mcg by mouth daily., Disp: , Rfl:   Allergies  Allergen Reactions  Tetanus Toxoids Shortness Of Breath   Sulfa Antibiotics     Severe vomiting    I personally reviewed active problem list, medication  list, allergies, notes from last encounter, lab results with the patient/caregiver today.   Review of Systems  Psychiatric/Behavioral:  Positive for depression. Negative for substance abuse and suicidal ideas. The patient is nervous/anxious and has insomnia.       Objective  Vitals:   01/09/23 0952  BP: 114/68  Pulse: 62  Resp: 16  SpO2: 96%  Weight: 179 lb 12.8 oz (81.6 kg)  Height: 5\' 4"  (1.626 m)    Body mass index is 30.86 kg/m.  Physical Exam Vitals reviewed.  Constitutional:      General: She is awake.     Appearance: Normal appearance. She is well-developed and well-groomed.  HENT:     Head: Normocephalic and atraumatic.  Eyes:     General: Lids are normal. Gaze aligned appropriately.     Extraocular Movements: Extraocular movements intact.     Conjunctiva/sclera: Conjunctivae normal.  Pulmonary:     Effort: Pulmonary effort is normal.  Skin:    General: Skin is warm and dry.  Neurological:     General: No focal deficit present.     Mental Status: She is alert and oriented to person, place, and time. Mental status is at baseline.     GCS: GCS eye subscore is 4. GCS verbal subscore is 5. GCS motor subscore is 6.     Cranial Nerves: No cranial nerve deficit, dysarthria or facial asymmetry.  Psychiatric:        Attention and Perception: Attention and perception normal.        Mood and Affect: Mood and affect normal.        Speech: Speech normal.        Behavior: Behavior normal. Behavior is cooperative.        Thought Content: Thought content normal.        Cognition and Memory: Cognition normal.        Judgment: Judgment normal.      Recent Results (from the past 2160 hour(s))  CA 125     Status: None   Collection Time: 11/15/22  9:14 AM  Result Value Ref Range   Cancer Antigen (CA) 125 7.0 0.0 - 38.1 U/mL    Comment: (NOTE) Roche [employer]ostics Electrochemiluminescence Immunoassay (ECLIA) Values obtained with different assay methods or kits cannot  be used interchangeably.  Results cannot be interpreted as absolute evidence of the presence or absence of malignant disease. Performed At: [facility] [address] [place], [place] [id number] Jolene Schimke MD QM:[id number]   Wet prep, genital     Status: None   Collection Time: 11/15/22 10:10 AM  Result Value Ref Range   Yeast Wet Prep HPF POC NONE SEEN NONE SEEN   Trich, Wet Prep NONE SEEN NONE SEEN   Clue Cells Wet Prep HPF POC NONE SEEN NONE SEEN   WBC, Wet Prep HPF POC <10 <10   Sperm NONE SEEN     Comment: Performed at [facility], [address] [place], [place] [address]  COMPLETE METABOLIC PANEL WITH GFR     Status: Abnormal   Collection Time: 11/28/22  2:23 PM  Result Value Ref Range   Glucose, Bld 115 (H) 65 - 99 mg/dL    Comment: .            Fasting reference interval . For someone without known diabetes, a glucose  value between 100 and 125 mg/dL is consistent with prediabetes and should be confirmed with a follow-up test. .    BUN 14 7 - 25 mg/dL   Creat 5.18 8.41 - 6.60 mg/dL   eGFR 59 (L) > OR = 60 mL/min/1.61m2   BUN/Creatinine Ratio SEE NOTE: 6 - 22 (calc)    Comment:    Not Reported: BUN and Creatinine are within    reference range. .    Sodium 142 135 - 146 mmol/L   Potassium 4.0 3.5 - 5.3 mmol/L   Chloride 104 98 - 110 mmol/L   CO2 29 20 - 32 mmol/L   Calcium 9.4 8.6 - 10.4 mg/dL   Total Protein 6.0 (L) 6.1 - 8.1 g/dL   Albumin 4.0 3.6 - 5.1 g/dL   Globulin 2.0 1.9 - 3.7 g/dL (calc)   AG Ratio 2.0 1.0 - 2.5 (calc)   Total Bilirubin 0.4 0.2 - 1.2 mg/dL   Alkaline phosphatase (APISO) 65 37 - 153 U/L   AST 17 10 - 35 U/L   ALT 14 6 - 29 U/L  CBC w/Diff/Platelet     Status: Abnormal   Collection Time: 11/28/22  2:23 PM  Result Value Ref Range   WBC 12.8 (H) 3.8 - 10.8 Thousand/uL   RBC 4.22 3.80 - 5.10 Million/uL   Hemoglobin 13.3 11.7 - 15.5 g/dL   HCT 63.0 16.0 - 10.9 %   MCV 97.4 80.0 - 100.0 fL   MCH 31.5 27.0 -  33.0 pg   MCHC 32.4 32.0 - 36.0 g/dL    Comment: For adults, a slight decrease in the calculated MCHC value (in the range of 30 to 32 g/dL) is most likely not clinically significant; however, it should be interpreted with caution in correlation with other red cell parameters and the patient's clinical condition.    RDW 13.7 11.0 - 15.0 %   Platelets 168 140 - 400 Thousand/uL   MPV 10.8 7.5 - 12.5 fL   Neutro Abs 5,466 1,500 - 7,800 cells/uL   Absolute Lymphocytes 6,643 (H) 850 - 3,900 cells/uL   Absolute Monocytes 384 200 - 950 cells/uL   Eosinophils Absolute 243 15 - 500 cells/uL   Basophils Absolute 64 0 - 200 cells/uL   Neutrophils Relative % 42.7 %   Total Lymphocyte 51.9 %   Monocytes Relative 3.0 %   Eosinophils Relative 1.9 %   Basophils Relative 0.5 %  Magnesium     Status: None   Collection Time: 11/28/22  2:23 PM  Result Value Ref Range   Magnesium 1.8 1.5 - 2.5 mg/dL  N23     Status: None   Collection Time: 11/28/22  2:23 PM  Result Value Ref Range   Vitamin B-12 1,000 200 - 1,100 pg/mL  Vitamin D (25 hydroxy)     Status: None   Collection Time: 11/28/22  2:23 PM  Result Value Ref Range   Vit D, 25-Hydroxy 41 30 - 100 ng/mL    Comment: Vitamin D Status         25-OH Vitamin D: . Deficiency:                    <20 ng/mL Insufficiency:             20 - 29 ng/mL Optimal:                 > or = 30 ng/mL . For 25-OH Vitamin D testing on patients on  D2-supplementation and patients for whom quantitation  of D2 and D3 fractions is required, the QuestAssureD(TM) 25-OH VIT D, (D2,D3), LC/MS/MS is recommended: order  code 16109 (patients >20yrs). . See Note 1 . Note 1 . For additional information, please refer to  http://education.QuestDiagnostics.com/faq/FAQ199  (This link is being provided for informational/ educational purposes only.)      PHQ2/9:    01/09/2023   10:02 AM 11/28/2022    1:43 PM 08/23/2022    1:21 PM 05/10/2022    1:46 PM 03/31/2022    11:33 AM  Depression screen PHQ 2/9  Decreased Interest 1 1 1 2  0  Down, Depressed, Hopeless 1 1 1 2  0  PHQ - 2 Score 2 2 2 4  0  Altered sleeping 1 1 1 3    Tired, decreased energy 1 1 1  0   Change in appetite 1 0 1 0   Feeling bad or failure about yourself  0 0 0 1   Trouble concentrating 1 0 0 0   Moving slowly or fidgety/restless 0 0 0 0   Suicidal thoughts 0 0 0 0   PHQ-9 Score 6 4 5 8    Difficult doing work/chores Somewhat difficult Somewhat difficult Not difficult at all Somewhat difficult       Fall Risk:    01/09/2023    9:52 AM 11/28/2022    1:43 PM 08/23/2022    1:21 PM 05/10/2022    1:46 PM 03/31/2022   11:29 AM  Fall Risk   Falls in the past year? 1 1 1  0 0  Number falls in past yr: 1 1 1  0 0  Injury with Fall? 1 0 0 0 0  Risk for fall due to : No Fall Risks Impaired balance/gait  No Fall Risks No Fall Risks  Follow up Falls prevention discussed;Education provided;Falls evaluation completed Falls prevention discussed;Education provided;Falls evaluation completed  Falls prevention discussed;Education provided;Falls evaluation completed Falls prevention discussed;Follow up appointment      Functional Status Survey: Is the patient deaf or have difficulty hearing?: No Does the patient have difficulty seeing, even when wearing glasses/contacts?: No Does the patient have difficulty concentrating, remembering, or making decisions?: No Does the patient have difficulty walking or climbing stairs?: No Does the patient have difficulty dressing or bathing?: No Does the patient have difficulty doing errands alone such as visiting a doctor's office or shopping?: No    Assessment & Plan  Problem List Items Addressed This Visit       Other   Insomnia - Primary    Chronic, ongoing Patient is currently taking trazodone 50 mg p.o. nightly.  She reports that this does help her with relaxing and feeling more calm which assist with sleep initiation.  She reports that she does  continue to have some sleep initiation issues but when she does fall asleep she is able to stay asleep Continue with trazodone at current dosing for now.  Will try adding BuSpar to assist with anxiety which will likely also benefit racing thoughts at night Follow-up in about 6 weeks or sooner if concerns arise      Current mild episode of major depressive disorder without prior episode (HCC)    Chronic, ongoing Reviewed PHQ-9 results with her today which is a 6 compared to 4 last month Recommend she continues to take Lexapro 20 mg p.o. daily and will add BuSpar 5 mg.  Recommend that she takes at least once per day and can take second dose as needed for exacerbated  symptoms or she can use hydroxyzine 25 mg up to 3 times a day as needed Reviewed that we should continue to work on sleep. Given that her symptoms are likely reactionary to family stressors she may benefit from therapy.  Will try to discuss this at next appointment Follow-up in about 6 weeks or sooner if concerns arise      Relevant Medications   busPIRone (BUSPAR) 5 MG tablet   GAD (generalized anxiety disorder)    Chronic, historic condition Ongoing and appears exacerbated according to GAD-7 results.  GAD-7 is 12 today compared to score of 6 about a month ago Continue Lexapro 20 mg p.o. daily.  Will try adding BuSpar 5 mg p.o. twice daily as needed.  Reviewed that this medication would likely need to have semistable state to prevent side effects.  Recommend that she takes at least once a day in the morning and can take second dose as needed throughout the day or she can proceed with taking hydroxyzine as needed Patient voiced agreement and understanding with current plan Follow-up in about 6 weeks to assess response      Relevant Medications   busPIRone (BUSPAR) 5 MG tablet     Return in about 6 weeks (around 02/20/2023) for Depression, anxiety.   I, Dereona Kolodny E Mateja Dier, PA-C, have reviewed all documentation for this visit. The  documentation on 01/09/23 for the exam, diagnosis, procedures, and orders are all accurate and complete.   Jacquelin Hawking, MHS, PA-C [facility]

## 2023-01-10 DIAGNOSIS — M79641 Pain in right hand: Secondary | ICD-10-CM | POA: Diagnosis not present

## 2023-01-16 ENCOUNTER — Encounter: Payer: Self-pay | Admitting: Cardiology

## 2023-01-16 ENCOUNTER — Ambulatory Visit: Payer: PPO | Attending: Cardiology | Admitting: Cardiology

## 2023-01-16 VITALS — BP 132/56 | HR 63 | Ht 62.0 in | Wt 181.6 lb

## 2023-01-16 DIAGNOSIS — E782 Mixed hyperlipidemia: Secondary | ICD-10-CM | POA: Diagnosis not present

## 2023-01-16 DIAGNOSIS — C562 Malignant neoplasm of left ovary: Secondary | ICD-10-CM

## 2023-01-16 DIAGNOSIS — R0602 Shortness of breath: Secondary | ICD-10-CM | POA: Diagnosis not present

## 2023-01-16 DIAGNOSIS — I471 Supraventricular tachycardia, unspecified: Secondary | ICD-10-CM | POA: Diagnosis not present

## 2023-01-16 DIAGNOSIS — R0609 Other forms of dyspnea: Secondary | ICD-10-CM | POA: Diagnosis not present

## 2023-01-16 DIAGNOSIS — Z9221 Personal history of antineoplastic chemotherapy: Secondary | ICD-10-CM

## 2023-01-16 MED ORDER — METOPROLOL SUCCINATE ER 25 MG PO TB24: 25.0000 mg | ORAL_TABLET | ORAL | 6 refills | Status: DC | PRN

## 2023-01-16 NOTE — Progress Notes (Signed)
Cardiology Office Note:  .   Date:  01/16/2023  ID:  [name], DOB 03/16/1945, MRN [id number] PCP: Danelle Berry, PA-C  [facility] Providers Cardiologist:  Yvonne Kendall, MD    History of Present Illness: .   [name] is a 77 y.o. female with a past medical history of SVT, hyperlipidemia, asthma, depression, generalized anxiety disorder, colon polyps, who presents for follow-up for SVT.   She was evaluated by Dr. Okey Dupre as a new patient on 03/14/2019 for palpitations in the setting of increased stress.  There was a remote isolated episode of syncope while donating blood many years prior.  She reported a stress test approximately 20 years prior as part of a CPE that she believed was normal.  Zio patch in 02/2019, showed a predominant rhythm of sinus with an average heart rate of 64 bpm (range 44 to 132 bpm in sinus), rare PACs and PVCs.  There were 20 atrial runs lasting up to 12.1 seconds with a maximal rate of 184 bpm.  There were no sustained arrhythmias or prolonged pauses.  Patient triggered events corresponded to sinus rhythm.  Echo 04/23/2019 showed an EF of 55 to 60%, no RWMA, normal diastolic function, normal RV systolic function and cavity size, and trivial mitral regurgitation.  She was seen in follow-up in 04/2019, continuing to note episodic chest discomfort that was described as a "pinch" and was randomly occurring lasting for several seconds in duration with spontaneous resolution.  In this setting, she underwent Lexiscan MPI in 05/2019 which showed no significant ischemia with CT attenuation corrected images showing mild aortic atherosclerosis with no significant coronary artery calcification noted.  Overall, this was a low risk study.  She was last seen in the office in 07/2019 and was doing relatively well.  She noted only 2 episodes of palpitations since her last visit.  1 occurred in the setting of significant stress.  She had not needed any as needed metoprolol.  Her  chronic exertional dyspnea was unchanged.  No changes were made at that time.    She was last seen in clinic 09/01/2021 doing fairly well from a cardiac perspective.  She been suffering from recurrent UTIs noted evaluation from urology and had an abdominal ultrasound that revealed a mixed cystic and solid mass suspicious for an ovarian mass.  MRI revealed a large cystic and solid pelvic mass centered in the pelvis.  She was referred to gynecologic oncology at [facility] for surgery.  She was seen and scheduled for nuclear stress testing for surgical clearance for upcoming surgery.  She returns to clinic today stating that overall she has been doing OK. She continues to have shortness of breath and dyspnea on exertion.  She stated this has been since she finished her last chemotherapy January 30, 2022 after being diagnosed with ovarian cancer.  She also denies any continuing fatigue.  She stated that when reading the pamphlets about her chemotherapy she was advised that one of the side effects was shortness of breath so she thought that her shortness of breath was related to her chemo but she was unsure.  She states that she has not had to take an abundant amount of her Toprol-XL for palpitations on as-needed basis another prescription is approximately 77 years old.  She denies any recent hospitalizations or visits to the emergency department states that she has been compliant with her current medication regimen.  ROS: 10 point review of systems has been reviewed and considered negative with  exception was been listed in the HPI  Studies Reviewed: Marland Kitchen   EKG Interpretation Date/Time:  Tuesday January 16 2023 14:00:39 EST Ventricular Rate:  63 PR Interval:  154 QRS Duration:  70 QT Interval:  334 QTC Calculation: 341 R Axis:   16  Text Interpretation: Normal sinus rhythm with sinus arrhythmia Nonspecific T wave abnormality When compared with ECG of 21-Mar-2000 18:57, Confirmed by Charlsie Quest ([id number]) on  01/16/2023 2:11:39 PM    Lexiscan Myoview 08/23/2021   Normal pharmacologic myocardial perfusion stress test without evidence of significant ischemia or scar.   Left ventricular systolic function is normal (LVEF > 65%).   There is no significant coronary artery calcification.  Aortic atherosclerosis is noted on the attenuation correction CT.   No significant change from prior study on 05/15/2019.   This is a low risk study.   Echo 04/2019  1. Left ventricular ejection fraction, by estimation, is 55 to 60%. The  left ventricle has normal function. The left ventricle has no regional  wall motion abnormalities. Left ventricular diastolic parameters were  normal.   2. Right ventricular systolic function is normal. The right ventricular  size is normal.   3. The mitral valve is normal in structure. Trivial mitral valve  regurgitation. No evidence of mitral stenosis.   4. The aortic valve is normal in structure. Aortic valve regurgitation is  not visualized. No aortic stenosis is present.   5. The inferior vena cava is normal in size with greater than 50%  respiratory variability, suggesting right atrial pressure of 3 mmHg.    Myoview Lexiscan 05/2019 Narrative & Impression  Pharmacological myocardial perfusion imaging study with no significant  ischemia Normal wall motion, EF estimated at 81% No EKG changes concerning for ischemia at peak stress or in recovery. CT attenuation correction images with mild aortic atherosclerosis, no significant coronary calcification noted Low risk scan     Signed, Dossie Arbour, MD, Ph.D [facility]    Heart monitor 03/2019   Patient was monitored for 13 days, 21 hours. The predominant rhythm was sinus with an average rate of 64 bpm (range 44 to 132 bpm in sinus). There were rare PACs and PVCs. 20 atrial runs lasting up to 12.1 seconds with a maximal rate of 184 bpm occurred. No sustained arrhythmia or prolonged pause was identified. Patient triggered  events correspond to sinus rhythm.   Predominantly sinus rhythm with rare PACs and PVCs as well as PSVT.  Patient triggered events correspond to sinus rhythm. Risk Assessment/Calculations:             Physical Exam:   VS:  BP (!) 132/56 (BP Location: Left Arm, Patient Position: Sitting, Cuff Size: Normal)   Pulse 63   Ht 5\' 2"  (1.575 m)   Wt 181 lb 9.6 oz (82.4 kg)   LMP  (LMP Unknown)   SpO2 96%   BMI 33.22 kg/m    Wt Readings from Last 3 Encounters:  01/16/23 181 lb 9.6 oz (82.4 kg)  01/09/23 179 lb 12.8 oz (81.6 kg)  11/28/22 180 lb 3.2 oz (81.7 kg)    GEN: Well nourished, well developed in no acute distress NECK: No JVD; No carotid bruits CARDIAC: RRR, no murmurs, rubs, gallops RESPIRATORY:  Clear to auscultation without rales, wheezing or rhonchi  ABDOMEN: Soft, non-tender, non-distended EXTREMITIES:  No edema; No deformity   ASSESSMENT AND PLAN: .   Shortness of breath and dyspnea on exertion that has been gradually worsening.  Patient recently finished  chemotherapy 8 is related 2023 after being diagnosed with ovarian cancer.  She is open her shortness of breath was strictly related to side effects from chemo.  Last echocardiogram was completed in 2021.  She has not been scheduled for an updated echocardiogram to evaluate heart function as a causative factor for shortness of breath and dyspnea on exertion.  She is euvolemic on exam without changes in weight.  Recent labs completed by PCP in 11/28/2022 revealed stable hemoglobin and no electrolyte abnormalities.  PSVT she continues to do well with minimal episodes of palpitations.  Resting heart rate today of 63.  EKG reveals sinus rhythm with sinus arrhythmia nonspecific T wave abnormality.  No significant change from prior studies.  She is continued on Toprol-XL 25 mg as needed for palpitations with a refill sent in on a prescription to his pharmacy of choice.  Hyperlipidemia with an LDL of 86.  She continues to be on  lovastatin 20 mg daily.  This continues to be managed by her PCP.  History of ovarian cancer which she underwent surgery and chemotherapy with her last round of chemo January 30, 2022.  She went through 6 rounds of chemo and continues to follow-up for her surveillance studies.       Dispo: Patient to return to clinic to see MD/APP in 11-12 months or sooner if needed.  Has been explained to patient that if echocardiogram comes back abnormal and changes in current regimen needed to be made or worsening of symptoms appointment will be moved up to an earlier timeframe.  Signed, Mizuki Hoel, NP

## 2023-01-16 NOTE — Patient Instructions (Signed)
Medication Instructions:  Your physician has recommended you make the following change in your medication:  metoprolol succinate (TOPROL-XL) 25 MG 24 hr tablet - Take 1 tablet (25 mg total) by mouth as needed (for palpitations)  *If you need a refill on your cardiac medications before your next appointment, please call your pharmacy*  Lab Work: - None ordered  Testing/Procedures: Your physician has requested that you have an echocardiogram. Echocardiography is a painless test that uses sound waves to create images of your heart. It provides your doctor with information about the size and shape of your heart and how well your heart's chambers and valves are working. This procedure takes approximately one hour. There are no restrictions for this procedure. Please do NOT wear cologne, perfume, aftershave, or lotions (deodorant is allowed). Please arrive 15 minutes prior to your appointment time.  Please note: We ask at that you not bring children with you during ultrasound (echo/ vascular) testing. Due to room size and safety concerns, children are not allowed in the ultrasound rooms during exams. Our front office staff cannot provide observation of children in our lobby area while testing is being conducted. An adult accompanying a patient to their appointment will only be allowed in the ultrasound room at the discretion of the ultrasound technician under special circumstances. We apologize for any inconvenience.   Follow-Up: At [facility], you and your health needs are our priority.  As part of our continuing mission to provide you with exceptional heart care, we have created designated Provider Care Teams.  These Care Teams include your primary Cardiologist (physician) and Advanced Practice Providers (APPs -  Physician Assistants and Nurse Practitioners) who all work together to provide you with the care you need, when you need it.  Your next appointment:   11 - 12  month(s)  Provider:   Charlsie Quest, NP

## 2023-01-23 ENCOUNTER — Encounter: Payer: Self-pay | Admitting: Cardiology

## 2023-02-01 ENCOUNTER — Other Ambulatory Visit: Payer: Self-pay | Admitting: Physician Assistant

## 2023-02-01 NOTE — Telephone Encounter (Signed)
Requested medication (s) are due for refill today - no  Requested medication (s) are on the active medication list -yes  Future visit scheduled -yes  Last refill: 01/09/23 #60 1RF  Notes to clinic: New start medication- initial Rx does not cover that amount- sent for review of request:   Pharmacy comment: REQUEST FOR 90 DAYS PRESCRIPTION.    Requested Prescriptions  Pending Prescriptions Disp Refills   busPIRone (BUSPAR) 5 MG tablet [Pharmacy Med Name: BUSPIRONE HCL 5 MG TABLET] 180 tablet 1    Sig: Take one tablet in the AM and then you can take a second as needed during the day     Psychiatry: Anxiolytics/Hypnotics - Non-controlled Passed - 02/01/2023 11:28 AM      Passed - Valid encounter within last 12 months    Recent Outpatient Visits           3 weeks ago Insomnia due to other mental disorder   [place] [facility] Mecum, Erin E, PA-C   2 months ago Current mild episode of major depressive disorder without prior episode [facility])   [facility] Mecum, Erin E, PA-C   5 months ago GAD (generalized anxiety disorder)   [place] [facility] Danelle Berry, PA-C   8 months ago Mixed hyperlipidemia   [facility] Danelle Berry, PA-C   1 year ago Pelvic mass   [facility] Danelle Berry, [place]       Future Appointments             In 2 weeks Mecum, Oswaldo Conroy, PA-C [facility]   In 10 months Deirdre Evener, MD [place] [facility]               Requested Prescriptions  Pending Prescriptions Disp Refills   busPIRone (BUSPAR) 5 MG tablet [Pharmacy Med Name: BUSPIRONE HCL 5 MG TABLET] 180 tablet 1    Sig: Take one tablet in the AM and then you can take a second as needed during the day     Psychiatry: Anxiolytics/Hypnotics - Non-controlled Passed - 02/01/2023 11:28 AM      Passed - Valid encounter within last 12  months    Recent Outpatient Visits           3 weeks ago Insomnia due to other mental disorder   [place] [facility] Mecum, Erin E, PA-C   2 months ago Current mild episode of major depressive disorder without prior episode [facility])   [place] [facility] Mecum, Erin E, PA-C   5 months ago GAD (generalized anxiety disorder)   [place] [facility] Danelle Berry, PA-C   8 months ago Mixed hyperlipidemia   [facility] Danelle Berry, PA-C   1 year ago Pelvic mass   [facility] Danelle Berry, [place]       Future Appointments             In 2 weeks Mecum, Oswaldo Conroy, PA-C [facility]   In 10 months Deirdre Evener, MD [facility]

## 2023-02-06 ENCOUNTER — Ambulatory Visit: Payer: PPO | Attending: Cardiology

## 2023-02-06 DIAGNOSIS — Z9221 Personal history of antineoplastic chemotherapy: Secondary | ICD-10-CM

## 2023-02-06 DIAGNOSIS — R0602 Shortness of breath: Secondary | ICD-10-CM

## 2023-02-06 DIAGNOSIS — I471 Supraventricular tachycardia, unspecified: Secondary | ICD-10-CM | POA: Diagnosis not present

## 2023-02-06 DIAGNOSIS — R0609 Other forms of dyspnea: Secondary | ICD-10-CM

## 2023-02-06 LAB — ECHOCARDIOGRAM COMPLETE
Area-P 1/2: 3.42 cm2
S' Lateral: 2.4 cm

## 2023-02-08 NOTE — Progress Notes (Signed)
Heart squeezes 60 to 65%, no wall motion abnormalities noted, there is mild stiffness noted in the muscle related to age or hypertension.  There is mild leakage in the mitral valve. No changes noted to cause symptoms.

## 2023-02-20 ENCOUNTER — Encounter: Payer: Self-pay | Admitting: Physician Assistant

## 2023-02-20 ENCOUNTER — Other Ambulatory Visit: Payer: Self-pay | Admitting: Family Medicine

## 2023-02-20 ENCOUNTER — Ambulatory Visit (INDEPENDENT_AMBULATORY_CARE_PROVIDER_SITE_OTHER): Payer: PPO | Admitting: Physician Assistant

## 2023-02-20 VITALS — BP 110/68 | HR 72 | Resp 16 | Ht 62.0 in

## 2023-02-20 DIAGNOSIS — F411 Generalized anxiety disorder: Secondary | ICD-10-CM | POA: Diagnosis not present

## 2023-02-20 DIAGNOSIS — F320 Major depressive disorder, single episode, mild: Secondary | ICD-10-CM | POA: Diagnosis not present

## 2023-02-20 DIAGNOSIS — F99 Mental disorder, not otherwise specified: Secondary | ICD-10-CM

## 2023-02-20 DIAGNOSIS — M65341 Trigger finger, right ring finger: Secondary | ICD-10-CM | POA: Diagnosis not present

## 2023-02-20 DIAGNOSIS — F5105 Insomnia due to other mental disorder: Secondary | ICD-10-CM | POA: Diagnosis not present

## 2023-02-20 NOTE — Progress Notes (Signed)
 Established Patient Office Visit  Name: [name]   MRN: [id number]    DOB: [birth year]   Date:02/20/2023  Today's Provider: [place], MHS, PA-C Introduced myself to the patient as a PA-C and provided education on APPs in clinical practice.         Subjective  Chief Complaint  Chief Complaint  Patient presents with   Medical Management of Chronic Issues    Depression, Anxiety    HPI   Discussed the use of AI scribe software for clinical note transcription with the patient, who gave verbal consent to proceed.  History of Present Illness   The patient, with a history of cancer and recent completion of chemotherapy, reports an overall improvement in mood since the last visit. She attributes this improvement to the new medication regimen, which includes Lexapro , hydroxyzine , and Buspar . The patient uses hydroxyzine  intermittently, sometimes once or twice a day, depending on her morning mood. Buspar  is used approximately twice a week. She reports no side effects from these medications.  The patient also takes trazodone  for sleep, which she reports is effective. She experiences some difficulty falling asleep due to racing thoughts but manages to sleep well once asleep. She denies any grogginess or foggy feeling upon waking.  The patient reports feeling fatigued, which she attributes to the recovery from chemotherapy. She is due for a three-month checkup with her oncologist, including routine lab work. She denies any other symptoms such as palpitations, nausea, vomiting, dizziness, headaches, or falls. She also denies any thoughts of self-harm or harm to others.       Reports after she goes to sleep she is fine - sometimes has some sleep initiation issues due to anxiety       02/20/2023    9:16 AM 01/09/2023   10:02 AM 11/28/2022    1:43 PM 08/23/2022    1:21 PM 05/10/2022    1:46 PM  Depression screen PHQ 2/9  Decreased Interest 0 1 1 1 2   Down, Depressed, Hopeless 0  1 1 1 2   PHQ - 2 Score 0 2 2 2 4   Altered sleeping 1 1 1 1 3   Tired, decreased energy 1 1 1 1  0  Change in appetite 0 1 0 1 0  Feeling bad or failure about yourself  0 0 0 0 1  Trouble concentrating 0 1 0 0 0  Moving slowly or fidgety/restless 0 0 0 0 0  Suicidal thoughts 0 0 0 0 0  PHQ-9 Score 2 6 4 5 8   Difficult doing work/chores  Somewhat difficult Somewhat difficult Not difficult at all Somewhat difficult        02/20/2023    9:16 AM 01/09/2023   10:02 AM 11/28/2022    1:49 PM 08/23/2022    1:21 PM  GAD 7 : Generalized Anxiety Score  Nervous, Anxious, on Edge 1 2 1  0  Control/stop worrying 1 2 1  0  Worry too much - different things 0 2 1 0  Trouble relaxing 0 2 1 0  Restless 0 2 1 0  Easily annoyed or irritable 1 0 1 0  Afraid - awful might happen 0 2 0 0  Total GAD 7 Score 3 12 6  0  Anxiety Difficulty  Somewhat difficult Somewhat difficult Not difficult at all      Patient Active Problem List   Diagnosis Date Noted   Fatigue 11/28/2022   Genetic testing 11/02/2021   Ovarian  cancer on left (HCC) 09/28/2021   Depression, unspecified 09/06/2021   Mild intermittent asthma 09/06/2021   Ovarian mass 08/10/2021   PSVT (paroxysmal supraventricular tachycardia) (HCC) 08/14/2019   Vitamin D  deficiency 06/10/2019   Current mild episode of major depressive disorder without prior episode (HCC) 10/15/2018   GAD (generalized anxiety disorder) 10/15/2018   History of colonic polyps    Obesity (BMI 30.0-34.9) 05/29/2018   GERD (gastroesophageal reflux disease) 11/27/2017   Osteopenia 11/27/2017   Mixed hyperlipidemia 12/09/2014   Insomnia 12/09/2014    Past Surgical History:  Procedure Laterality Date   BILATERAL SALPINGO-OOPHORECTOMY AND OMENTECTOMY; WITH RADICAL DISSECTION FOR DEBULKING  09/08/2021   BREAST BIOPSY Right    core bx- neg   COLONOSCOPY     COLONOSCOPY WITH PROPOFOL  N/A 10/08/2018   Procedure: COLONOSCOPY WITH PROPOFOL ;  Surgeon: Jinny Carmine, MD;   Location: [facility] ENDOSCOPY;  Service: Endoscopy;  Laterality: N/A;   MOHS SURGERY     TONSILLECTOMY     VAGINAL HYSTERECTOMY      Family History  Problem Relation Age of Onset   Lung cancer Mother    Diabetes Father    Parkinson's disease Father    Alzheimer's disease Father    Heart attack Father 69   Alcohol abuse Sister    Heart disease Sister    Lung cancer Sister 78   Down syndrome Sister    Colon cancer Paternal Uncle        d. 36s   Melanoma Maternal Grandmother    Stroke Paternal Grandmother    Lung cancer Paternal Grandmother        d. 35s   Stomach cancer Paternal Grandfather        d. 103s   Liver cancer Son 7   Esophageal cancer Neg Hx    Breast cancer Neg Hx     Social History   Tobacco Use   Smoking status: Former    Current packs/day: 0.00    Average packs/day: 1.5 packs/day for 20.0 years (30.0 ttl pk-yrs)    Types: Cigarettes    Start date: 51    Quit date: 35    Years since quitting: 32.0    Passive exposure: Past   Smokeless tobacco: Never  Substance Use Topics   Alcohol use: Yes    Alcohol/week: 1.0 standard drink of alcohol    Types: 1 Glasses of wine per week    Comment: one glass of wine once a month     Current Outpatient Medications:    acetaminophen  (TYLENOL ) 325 MG tablet, Take 650 mg by mouth every 6 (six) hours as needed., Disp: , Rfl:    busPIRone  (BUSPAR ) 5 MG tablet, Take one tablet in the AM and then you can take a second as needed during the day, Disp: 60 tablet, Rfl: 1   escitalopram  (LEXAPRO ) 20 MG tablet, TAKE 1 TABLET BY MOUTH EVERY DAY, Disp: 90 tablet, Rfl: 0   famotidine  (PEPCID ) 10 MG tablet, Take 1 tablet (10 mg total) by mouth 2 (two) times daily as needed for heartburn or indigestion., Disp: , Rfl:    hydrOXYzine  (ATARAX ) 25 MG tablet, Take 1 tablet (25 mg total) by mouth every 8 (eight) hours as needed for anxiety (insomnia)., Disp: 90 tablet, Rfl: 1   lovastatin  (MEVACOR ) 20 MG tablet, TAKE 1 TABLET BY MOUTH  EVERYDAY AT BEDTIME, Disp: 90 tablet, Rfl: 3   melatonin 3 MG TABS tablet, Take 3 mg by mouth at bedtime., Disp: , Rfl:  meloxicam (MOBIC) 7.5 MG tablet, Take 7.5 mg by mouth as needed., Disp: , Rfl:    metoprolol  succinate (TOPROL -XL) 25 MG 24 hr tablet, Take 1 tablet (25 mg total) by mouth as needed (for palpitations)., Disp: 30 tablet, Rfl: 6   traZODone  (DESYREL ) 50 MG tablet, Take 1 tablet (50 mg total) by mouth at bedtime., Disp: 90 tablet, Rfl: 1   VITAMIN D , CHOLECALCIFEROL, PO, Take 500 mg by mouth daily., Disp: , Rfl:    Ascorbic Acid (VITAMIN C) 1000 MG tablet, Take 1,000 mg by mouth daily. (Patient not taking: Reported on 02/20/2023), Disp: , Rfl:    vitamin B-12 (CYANOCOBALAMIN ) 1000 MCG tablet, Take 1,000 mcg by mouth daily. (Patient not taking: Reported on 02/20/2023), Disp: , Rfl:   Allergies  Allergen Reactions   Tetanus Toxoids Shortness Of Breath   Sulfa Antibiotics     Severe vomiting    I personally reviewed active problem list, medication list, notes from last encounter with the patient/caregiver today.   Review of Systems  Constitutional:  Positive for malaise/fatigue.  Cardiovascular:  Negative for chest pain and palpitations.  Gastrointestinal:  Negative for diarrhea, nausea and vomiting.  Musculoskeletal:  Negative for falls.  Neurological:  Negative for dizziness, loss of consciousness and headaches.  Psychiatric/Behavioral:  Positive for depression. Negative for memory loss, substance abuse and suicidal ideas. The patient is nervous/anxious and has insomnia.       Objective  Vitals:   02/20/23 0917  BP: 110/68  Pulse: 72  Resp: 16  SpO2: 95%  Height: 5' 2 (1.575 m)    Body mass index is 33.22 kg/m.  Physical Exam Vitals reviewed.  Constitutional:      General: She is awake.     Appearance: Normal appearance. She is well-developed and well-groomed.  HENT:     Head: Normocephalic and atraumatic.  Eyes:     General: Lids are normal. Gaze  aligned appropriately.     Extraocular Movements: Extraocular movements intact.     Conjunctiva/sclera: Conjunctivae normal.  Pulmonary:     Effort: Pulmonary effort is normal.  Neurological:     General: No focal deficit present.     Mental Status: She is alert and oriented to person, place, and time.     GCS: GCS eye subscore is 4. GCS verbal subscore is 5. GCS motor subscore is 6.     Cranial Nerves: No cranial nerve deficit, dysarthria or facial asymmetry.  Psychiatric:        Attention and Perception: Attention and perception normal.        Mood and Affect: Mood and affect normal.        Speech: Speech normal.        Behavior: Behavior normal. Behavior is cooperative.      Recent Results (from the past 2160 hours)  COMPLETE METABOLIC PANEL WITH GFR     Status: Abnormal   Collection Time: 11/28/22  2:23 PM  Result Value Ref Range   Glucose, Bld 115 (H) 65 - 99 mg/dL    Comment: .            Fasting reference interval . For someone without known diabetes, a glucose value between 100 and 125 mg/dL is consistent with prediabetes and should be confirmed with a follow-up test. .    BUN 14 7 - 25 mg/dL   Creat 9.01 9.39 - 8.99 mg/dL   eGFR 59 (L) > OR = 60 mL/min/1.8m2   BUN/Creatinine Ratio SEE NOTE: 6 -  22 (calc)    Comment:    Not Reported: BUN and Creatinine are within    reference range. .    Sodium 142 135 - 146 mmol/L   Potassium 4.0 3.5 - 5.3 mmol/L   Chloride 104 98 - 110 mmol/L   CO2 29 20 - 32 mmol/L   Calcium  9.4 8.6 - 10.4 mg/dL   Total Protein 6.0 (L) 6.1 - 8.1 g/dL   Albumin 4.0 3.6 - 5.1 g/dL   Globulin 2.0 1.9 - 3.7 g/dL (calc)   AG Ratio 2.0 1.0 - 2.5 (calc)   Total Bilirubin 0.4 0.2 - 1.2 mg/dL   Alkaline phosphatase (APISO) 65 37 - 153 U/L   AST 17 10 - 35 U/L   ALT 14 6 - 29 U/L  CBC w/Diff/Platelet     Status: Abnormal   Collection Time: 11/28/22  2:23 PM  Result Value Ref Range   WBC 12.8 (H) 3.8 - 10.8 Thousand/uL   RBC 4.22 3.80 - 5.10  Million/uL   Hemoglobin 13.3 11.7 - 15.5 g/dL   HCT 58.8 64.9 - 54.9 %   MCV 97.4 80.0 - 100.0 fL   MCH 31.5 27.0 - 33.0 pg   MCHC 32.4 32.0 - 36.0 g/dL    Comment: For adults, a slight decrease in the calculated MCHC value (in the range of 30 to 32 g/dL) is most likely not clinically significant; however, it should be interpreted with caution in correlation with other red cell parameters and the patient's clinical condition.    RDW 13.7 11.0 - 15.0 %   Platelets 168 140 - 400 Thousand/uL   MPV 10.8 7.5 - 12.5 fL   Neutro Abs 5,466 1,500 - 7,800 cells/uL   Absolute Lymphocytes 6,643 (H) 850 - 3,900 cells/uL   Absolute Monocytes 384 200 - 950 cells/uL   Eosinophils Absolute 243 15 - 500 cells/uL   Basophils Absolute 64 0 - 200 cells/uL   Neutrophils Relative % 42.7 %   Total Lymphocyte 51.9 %   Monocytes Relative 3.0 %   Eosinophils Relative 1.9 %   Basophils Relative 0.5 %  Magnesium     Status: None   Collection Time: 11/28/22  2:23 PM  Result Value Ref Range   Magnesium 1.8 1.5 - 2.5 mg/dL  A87     Status: None   Collection Time: 11/28/22  2:23 PM  Result Value Ref Range   Vitamin B-12 1,000 200 - 1,100 pg/mL  Vitamin D  (25 hydroxy)     Status: None   Collection Time: 11/28/22  2:23 PM  Result Value Ref Range   Vit D, 25-Hydroxy 41 30 - 100 ng/mL    Comment: Vitamin D  Status         25-OH Vitamin D : . Deficiency:                    <20 ng/mL Insufficiency:             20 - 29 ng/mL Optimal:                 > or = 30 ng/mL . For 25-OH Vitamin D  testing on patients on  D2-supplementation and patients for whom quantitation  of D2 and D3 fractions is required, the [facility](TM) 25-OH VIT D, (D2,D3), LC/MS/MS is recommended: order  code 07111 (patients >10yrs). . See Note 1 . Note 1 . For additional information, please refer to  http://education.[facility].com/faq/FAQ199  (This link is being provided for informational/ educational  purposes only.)    ECHOCARDIOGRAM COMPLETE     Status: None   Collection Time: 02/06/23  9:51 AM  Result Value Ref Range   S' Lateral 2.40 cm   Area-P 1/2 3.42 cm2   Est EF 60 - 65%      PHQ2/9:    02/20/2023    9:16 AM 01/09/2023   10:02 AM 11/28/2022    1:43 PM 08/23/2022    1:21 PM 05/10/2022    1:46 PM  Depression screen PHQ 2/9  Decreased Interest 0 1 1 1 2   Down, Depressed, Hopeless 0 1 1 1 2   PHQ - 2 Score 0 2 2 2 4   Altered sleeping 1 1 1 1 3   Tired, decreased energy 1 1 1 1  0  Change in appetite 0 1 0 1 0  Feeling bad or failure about yourself  0 0 0 0 1  Trouble concentrating 0 1 0 0 0  Moving slowly or fidgety/restless 0 0 0 0 0  Suicidal thoughts 0 0 0 0 0  PHQ-9 Score 2 6 4 5 8   Difficult doing work/chores  Somewhat difficult Somewhat difficult Not difficult at all Somewhat difficult      Fall Risk:    01/09/2023    9:52 AM 11/28/2022    1:43 PM 08/23/2022    1:21 PM 05/10/2022    1:46 PM 03/31/2022   11:29 AM  Fall Risk   Falls in the past year? 1 1 1  0 0  Number falls in past yr: 1 1 1  0 0  Injury with Fall? 1 0 0 0 0  Risk for fall due to : No Fall Risks Impaired balance/gait  No Fall Risks No Fall Risks  Follow up Falls prevention discussed;Education provided;Falls evaluation completed Falls prevention discussed;Education provided;Falls evaluation completed  Falls prevention discussed;Education provided;Falls evaluation completed Falls prevention discussed;Follow up appointment      Functional Status Survey:      Assessment & Plan  Problem List Items Addressed This Visit       Other   Insomnia - Primary   Current mild episode of major depressive disorder without prior episode (HCC)   GAD (generalized anxiety disorder)   Assessment and Plan    Generalized Anxiety Disorder and major depressive disorder and insomnia  Reports improvement in anxiety symptoms with current medication regimen (Lexapro  daily, hydroxyzine  as needed, Buspar  twice a week). No  significant side effects. Mood improved, better stress management. Discussed Buspar  usage up to three to four times a day if necessary. - Continue Lexapro  once daily - Use hydroxyzine  as needed - Use Buspar  as needed, up to three times a day if necessary - Continue trazodone  for sleep as currently prescribed  Post-Chemotherapy Fatigue Ongoing fatigue attributed to recent chemotherapy recovery. No new symptoms (dizziness, headaches, palpitations). Routine labs to be done at upcoming oncology follow-up. Discussed potential sodium level drop from Lexapro  contributing to fatigue. Will monitor lab results. - Monitor fatigue symptoms - Review lab results from oncology follow-up - Encourage rest and gradual increase in activity as tolerated  General Health Maintenance Scheduled wellness visit in February but will be out of town. - Reschedule wellness visit from February to a later date  Follow-up - Follow up with oncology as scheduled - Schedule next visit in six months.        Return in about 6 months (around 08/20/2023) for anxiety, Depression.   I, Xayvier Vallez E Calley Drenning, PA-C, have reviewed all documentation for this visit. The  documentation on 02/20/23 for the exam, diagnosis, procedures, and orders are all accurate and complete.   [place], MHS, PA-C [facility]

## 2023-02-21 ENCOUNTER — Inpatient Hospital Stay (HOSPITAL_BASED_OUTPATIENT_CLINIC_OR_DEPARTMENT_OTHER): Payer: PPO | Admitting: Nurse Practitioner

## 2023-02-21 ENCOUNTER — Inpatient Hospital Stay: Payer: PPO | Attending: Internal Medicine

## 2023-02-21 VITALS — BP 130/61 | HR 61 | Temp 97.6°F | Resp 19 | Wt 183.3 lb

## 2023-02-21 DIAGNOSIS — Z8543 Personal history of malignant neoplasm of ovary: Secondary | ICD-10-CM | POA: Insufficient documentation

## 2023-02-21 DIAGNOSIS — Z08 Encounter for follow-up examination after completed treatment for malignant neoplasm: Secondary | ICD-10-CM | POA: Diagnosis not present

## 2023-02-21 NOTE — Progress Notes (Signed)
 Gynecologic Oncology Interval Visit   Referring Provider: Dr. 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 female s/p prior hysterectomy (ovaries in situ) who is seen in consultation from Dr. MacDiarmid for ovarian mass. S/p ex-lap, BSO, biopsies, appendectomy, adhesiolysis with Dr. Elby at [facility] on 09/08/21. She is s/p 6 of 6 planned cycles of adjuvant chemotherapy with carboplatin  and paclitaxel , completed 01/30/22.   She feels well overall. Has chronic back pain and shortness of breath when climbing stairs which is not worsening. Denies pelvic pain, bleeding, or pain. Bowel movements are normal. She has chronic fatigue and neuropathy which is managed by Dr. Maree, neurology.   CA125 pending.   Component Ref Range & Units (hover) 3 mo ago (11/15/22) 7 mo ago (07/19/22) 9 mo ago (05/03/22) 1 yr ago (01/30/22) 1 yr ago (01/09/22) 1 yr ago (12/15/21) 1 yr ago (11/03/21)  Cancer Antigen (CA) 125 7.0 7.2 CM 7.8 CM 10.6 CM 10.3 CM 11.5 CM 14.3 CM     Gynecologic Oncologic History [name] is a pleasant female s/p prior hysterectomy (ovaries in situ) who is seen in consultation from Dr. MacDiarmid for ovarian mass.   Patient presented to Urology for incontinence. She had elevated residual urine and ultrasound was performed which showed 12.2 x 10.1 x 2.9 cm mixed cystic and solid mass suspicious for ovarian mass.   MRI 08/04/21- Reproductive: Large cystic and solid pelvic mass centered in the pelvis. Ovarian tissue or ovary is not demonstrated on the current study. Mass in total measuring 15 x 11 x 12 cm. Soft tissue component showing restricted diffusion and enhancement centered in the lower portion of the mass shows heterogeneous T2 signal and measures 10.8 x 6.8 x 8.5 cm amidst cystic areas. There is a small amount of associated ascites. The mass abuts the vaginal apex following hysterectomy, potentially involving the LEFT  vaginal apex. The soft tissue component displays very heterogeneous enhancement and there are numerous enhancing septations throughout. Collateral pathways from LEFT ovarian vein are noted about the LEFT lateral aspect of the mass. Given that the gonadal vein can be traced to the mass suspicion is for mass of LEFT ovarian origin though this may of parasitized flow from ovarian. Vessels on the LEFT   Other: Trace ascites. No discrete peritoneal nodularity about the pelvis.   Musculoskeletal: No suspicious bone lesion   IMPRESSION: 1. Large cystic and solid pelvic mass highly suspicious for neoplasm. Tumor may arise from the LEFT ovary based on proximity to LEFT ovarian vein though no defined normal ovarian tissue can be seen on either the RIGHT or the LEFT on today's exam. Correlate with surgical history. Sarcoma is also considered based on the marked heterogeneity seen within the lesion on the current study. There is strong restricted diffusion within and avid enhancement of the large central soft tissue component. Gyn consultation gyn consultation is suggested. 2. Trace ascites. 3. Tumor in close proximity to sigmoid colon without definitive signs of involvement also in close proximity to the urinary bladder with defined plane between urinary bladder and mass. Mass may involve the LEFT vaginal fornix. 4. Small amount of associated ascites.  She has history of hysterectomy at age 10 for endometriosis.   We recommended surgical management.  CA 125 was 526 at diagnosis  09/08/2021  Diagnostic laparoscopy, exploratory laparotomy, bilateral salpingo-oophorectomy, infracolic omentectomy, peritoneal biopsies, appendectomy with partial cecectomy (due to frozen section suggesting mucinous ovarian tumor and  appendix was adherent to cecum) adhesiolysis (including enterolysis and ovariolysis), and posterior rectus sheath blocks  with Dr Elby at [facility]. Mass was decompressed with controlled drainage due to  large size and extensive adhesions.  She has done well since surgery and has no significant complaints.  She continues on Lovenox.  A. 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 Negative for surface involvement. Bilateral fallopian tubes: Negative for carcinoma.   See synoptic report.   Comment: The tumor has an unusual microcystic morphology, but immunohistochemical studies are most consistent with a high-grade serous carcinoma.   B.  Right ovary, oophorectomy: Right ovary with no pathologic diagnosis.  Negative for carcinoma.   C. Omentum, omentectomy: Benign fibroadipose tissue. Negative for carcinoma.   D. Left paracolic gutter, biopsy: Benign fibroadipose tissue and skeletal muscle. Negative for carcinoma.   E. Left pelvic, biopsy: Benign fibroadipose tissue and smooth muscle. Negative for carcinoma.   F. Anterior cul de sac, biopsy: Benign fibroadipose tissue and smooth muscle. Negative for carcinoma.   G. Right pelvic, biopsy: Benign fibrous tissue and smooth muscle. Negative for carcinoma.   H. Posterior cul de sac, biopsy: Benign fibrous tissue. Negative for carcinoma.   I. Appendix and portion of cecum, appendectomy and partial cecectomy: Appendix and portion of cecum with no pathologic diagnosis. Negative for carcinoma.  Pelvic washings - negative for malignancy  8//31/2023-01/30/2022 :Carboplatin  (AUC 6) + Paclitaxel  (175) q21d X 6 Cycles  04/26/2022 CT C/A/P IMPRESSION: 1. No definitive evidence of metastatic disease. 2. Minimal focal vague haziness in the ventral left lower quadrant, status post omentectomy. No discrete nodules. Recommend attention on follow-up. 3.  Aortic atherosclerosis (ICD10-I70.0). 4.  Emphysema (ICD10-J43.9).  Genetics Assessment: Negative genetic testing. No pathogenic variants identified on the [facility]  CancerNext-Expanded+RNA panel. The report date is 10/31/2021. HRD testing through [facility] was also negative and reported out 10/31/2021.   Problem List: Patient Active Problem List   Diagnosis Date Noted   Fatigue 11/28/2022   Genetic testing 11/02/2021   Ovarian cancer on left (HCC) 09/28/2021   Depression, unspecified 09/06/2021   Mild intermittent asthma 09/06/2021   Ovarian mass 08/10/2021   PSVT (paroxysmal supraventricular tachycardia) (HCC) 08/14/2019   Vitamin D  deficiency 06/10/2019   Current mild episode of major depressive disorder without prior episode (HCC) 10/15/2018   GAD (generalized anxiety disorder) 10/15/2018   History of colonic polyps    Obesity (BMI 30.0-34.9) 05/29/2018   GERD (gastroesophageal reflux disease) 11/27/2017   Osteopenia 11/27/2017   Mixed hyperlipidemia 12/09/2014   Insomnia 12/09/2014    Past Medical History: Past Medical History:  Diagnosis Date   Allergy    Arthritis    Asthma    Atypical chest pain 03/14/2019   Atypical mole 06/12/2018   right prox lat thigh/mod   Atypical mole 05/19/2014   left sup med buttock/excision   Atypical mole 03/26/2014   right sup lat buttock   Atypical mole 03/26/2013   left lat mid back   Atypical mole 11/08/2006   right shoulder superior/excision   Basal cell carcinoma 06/12/2016   right medial sup canthus below eyebrow   Basal cell carcinoma 09/23/2014   left nasal bridge   Basal cell carcinoma 03/26/2013   left nasal sidewall   Basal cell carcinoma 08/27/2012   left nasal dorsum   Cancer (HCC)    shoulder, nose skin CA- basal cell CA   GERD (gastroesophageal reflux  disease)    occasional   Hyperlipidemia    Osteopenia    Polyp of transverse colon    Right arm weakness 03/14/2019   Stress due to illness of family member 05/29/2018    Past Surgical History: Past Surgical History:  Procedure Laterality Date   BILATERAL SALPINGO-OOPHORECTOMY AND OMENTECTOMY; WITH RADICAL DISSECTION  FOR DEBULKING  09/08/2021   BREAST BIOPSY Right    core bx- neg   COLONOSCOPY     COLONOSCOPY WITH PROPOFOL  N/A 10/08/2018   Procedure: COLONOSCOPY WITH PROPOFOL ;  Surgeon: Jinny Carmine, MD;  Location: [facility] ENDOSCOPY;  Service: Endoscopy;  Laterality: N/A;   MOHS SURGERY     TONSILLECTOMY     VAGINAL HYSTERECTOMY     Past Gynecologic History:  Menarche: age 82 Hysterectomy at age 89.   OB History:  OB History  No obstetric history on file.   Family History: Family History  Problem Relation Age of Onset   Lung cancer Mother    Diabetes Father    Parkinson's disease Father    Alzheimer's disease Father    Heart attack Father 48   Alcohol abuse Sister    Heart disease Sister    Lung cancer Sister 24   Down syndrome Sister    Colon cancer Paternal Uncle        d. 32s   Melanoma Maternal Grandmother    Stroke Paternal Grandmother    Lung cancer Paternal Grandmother        d. 28s   Stomach cancer Paternal Grandfather        d. 24s   Liver cancer Son 47   Esophageal cancer Neg Hx    Breast cancer Neg Hx     Social History: Social History   Socioeconomic History   Marital status: Married    Spouse name: [name]   Number of children: 2   Years of education: Not on file   Highest education level: Bachelor's degree (e.g., BA, AB, BS)  Occupational History   Occupation: Retired  Tobacco Use   Smoking status: Former    Current packs/day: 0.00    Average packs/day: 1.5 packs/day for 20.0 years (30.0 ttl pk-yrs)    Types: Cigarettes    Start date: 76    Quit date: 1993    Years since quitting: 32.0    Passive exposure: Past   Smokeless tobacco: Never  Vaping Use   Vaping status: Never Used  Substance and Sexual Activity   Alcohol use: Yes    Alcohol/week: 1.0 standard drink of alcohol    Types: 1 Glasses of wine per week    Comment: one glass of wine once a month   Drug use: Never   Sexual activity: Not Currently  Other Topics Concern   Not on file  Social  History Narrative   Lives with husband in [place]; quit smoking > 30 years; once every 1-2 months; retd. Teacher- elementary school;    Social Drivers of Health   Financial Resource Strain: Low Risk  (05/09/2022)   Overall Financial Resource Strain (CARDIA)    Difficulty of Paying Living Expenses: Not hard at all  Food Insecurity: No Food Insecurity (05/09/2022)   Hunger Vital Sign    Worried About Running Out of Food in the Last Year: Never true    Ran Out of Food in the Last Year: Never true  Transportation Needs: No Transportation Needs (05/09/2022)   PRAPARE - Administrator, Civil Service (Medical): No  Lack of Transportation (Non-Medical): No  Physical Activity: Insufficiently Active (05/09/2022)   Exercise Vital Sign    Days of Exercise per Week: 3 days    Minutes of Exercise per Session: 20 min  Stress: Stress Concern Present (05/09/2022)   [employer] of Occupational Health - Occupational Stress Questionnaire    Feeling of Stress : To some extent  Social Connections: Socially Integrated (05/09/2022)   Social Connection and Isolation Panel [NHANES]    Frequency of Communication with Friends and Family: Three times a week    Frequency of Social Gatherings with Friends and Family: Three times a week    Attends Religious Services: More than 4 times per year    Active Member of Clubs or Organizations: Yes    Attends Banker Meetings: More than 4 times per year    Marital Status: Married  Catering Manager Violence: Not At Risk (03/31/2022)   Humiliation, Afraid, Rape, and Kick questionnaire    Fear of Current or Ex-Partner: No    Emotionally Abused: No    Physically Abused: No    Sexually Abused: No    Allergies: Allergies  Allergen Reactions   Tetanus Toxoids Shortness Of Breath   Sulfa Antibiotics     Severe vomiting    Current Medications: Current Outpatient Medications  Medication Sig Dispense Refill   acetaminophen  (TYLENOL ) 325 MG  tablet Take 650 mg by mouth every 6 (six) hours as needed.     busPIRone  (BUSPAR ) 5 MG tablet Take one tablet in the AM and then you can take a second as needed during the day 60 tablet 1   escitalopram  (LEXAPRO ) 20 MG tablet TAKE 1 TABLET BY MOUTH EVERY DAY 90 tablet 0   famotidine  (PEPCID ) 10 MG tablet Take 1 tablet (10 mg total) by mouth 2 (two) times daily as needed for heartburn or indigestion.     hydrOXYzine  (ATARAX ) 25 MG tablet Take 1 tablet (25 mg total) by mouth every 8 (eight) hours as needed for anxiety (insomnia). 90 tablet 1   lovastatin  (MEVACOR ) 20 MG tablet TAKE 1 TABLET BY MOUTH EVERYDAY AT BEDTIME 90 tablet 3   melatonin 3 MG TABS tablet Take 3 mg by mouth at bedtime.     meloxicam (MOBIC) 7.5 MG tablet Take 7.5 mg by mouth as needed.     metoprolol  succinate (TOPROL -XL) 25 MG 24 hr tablet Take 1 tablet (25 mg total) by mouth as needed (for palpitations). 30 tablet 6   traZODone  (DESYREL ) 50 MG tablet Take 1 tablet (50 mg total) by mouth at bedtime. 90 tablet 1   vitamin B-12 (CYANOCOBALAMIN ) 1000 MCG tablet Take 1,000 mcg by mouth daily.     VITAMIN D , CHOLECALCIFEROL, PO Take 500 mg by mouth daily.     Ascorbic Acid (VITAMIN C) 1000 MG tablet Take 1,000 mg by mouth daily. (Patient not taking: Reported on 01/16/2023)     No current facility-administered medications for this visit.    Review of Systems General:  fatigue Skin: no complaints Eyes: no complaints HEENT: no complaints Breasts: no complaints Pulmonary: shortness of breath Cardiac: no complaints Gastrointestinal: no complaints Genitourinary/Sexual: no complaints Ob/Gyn: no complaints Musculoskeletal: back pain Hematology: no complaints Neurologic/Psych: no complaints    Objective:  Physical Examination:  BP 130/61   Pulse 61   Temp 97.6 F (36.4 C)   Resp 19   Wt 183 lb 4.8 oz (83.1 kg)   LMP  (LMP Unknown)   SpO2 97%   BMI 33.53  kg/m     ECOG Performance Status: 0 - Asymptomatic  GENERAL:  Patient is a well appearing female in no acute distress HEENT:  Sclera clear. Anicteric NODES:  Negative axillary, supraclavicular, inguinal lymph node survery LUNGS:  Clear to auscultation bilaterally.   HEART:  Regular rate and rhythm.  ABDOMEN:  Soft, nontender.  No hernias, mid-line incision well healed. No masses or ascites EXTREMITIES:  No peripheral edema. Atraumatic. No cyanosis SKIN:  Clear with no obvious rashes or skin changes.  NEURO:  Nonfocal. Well oriented.  Appropriate affect.  Pelvic: Exam chaperoned by CMA EGBUS: no lesions.  Vagina: no bleeding, discharge, or lesions.   Cervix: surgically absent Uterus: surgically absent BME: no palpable masses Rectovaginal: deferred   Lab Review:  CA 125 per HPI. Today's result pending.    Radiologic Imaging: No imaging on site.     Assessment:  [name] is a 78 y.o. female with complex 15 cm pelvic mass found to have stage Ic high grade serous cancer of the ovary.  In 7/23 had diagnostic laparoscopy, ex lap, BSO, infracolic omentectomy, peritoneal biopsies, LOA, appendectomy with partial cecectomy (due to frozen section suggesting mucinous ovarian tumor and appendix was adherent to cecum). She had prior hysterectomy. Pathology consistent with stage IC high grade serous ovarian cancer of left ovary based on controlled drainage of the mass.  She received 6 cycles of adjuvant carboplatin -paclitaxel . CA 125 was 526 at diagnosis. CT 04/2022 no definitive recurrence. CA 125 was down to 7 in October 2024. Asymptomatic. Clinically, NED today.   Germline panel testing and tumor HRD testing negative.   Medical co-morbidities complicating care: prior vaginal hysterectomy; asthma, and prior skin cancers Plan:   Problem List Items Addressed This Visit   None  RTC in 3 months for surveillance and pelvic exam with Dr. Elby. She prefers female providers for pelvic exam. Again reviewed symptoms that would be concerning for recurrence.  If she has concerning symptoms or if rising CA125, would recommend reevaluation and imaging. We reviewed survivorship topics today and recommendation for surveillance intervals.   She will follow up with her primary care doctor for ongoing wellness, management of other medical comorbidities, and cancer screenings.   The patient's diagnosis, an outline of the further diagnostic and laboratory studies which will be required, the recommendation for surgery, and alternatives were discussed with her and her accompanying family members.  All questions were answered to their satisfaction.  Tinnie KANDICE Dawn, NP

## 2023-02-22 LAB — CA 125: Cancer Antigen (CA) 125: 6.6 U/mL (ref 0.0–38.1)

## 2023-03-07 ENCOUNTER — Other Ambulatory Visit: Payer: Self-pay | Admitting: Physician Assistant

## 2023-03-07 DIAGNOSIS — M65341 Trigger finger, right ring finger: Secondary | ICD-10-CM | POA: Diagnosis not present

## 2023-03-07 NOTE — Telephone Encounter (Signed)
Requested Prescriptions  Pending Prescriptions Disp Refills   busPIRone (BUSPAR) 5 MG tablet [Pharmacy Med Name: BUSPIRONE HCL 5 MG TABLET] 180 tablet 1    Sig: TAKE ONE TABLET IN THE AM AND THEN YOU CAN TAKE A SECOND AS NEEDED DURING THE DAY     Psychiatry: Anxiolytics/Hypnotics - Non-controlled Passed - 03/07/2023 10:19 AM      Passed - Valid encounter within last 12 months    Recent Outpatient Visits           2 weeks ago Insomnia due to other mental disorder   [facility] M[facility]um, Erin E, PA-C   1 month ago Insomnia due to other mental disorder   [place] [facility] Mecum, Oswaldo Conroy, PA-C   3 months ago Current mild episode of major depressive disorder without prior episode [facility])   [place] [facility] Mecum, Erin E, PA-C   6 months ago GAD (generalized anxiety disorder)   [facility] Danelle Berry, PA-C   10 months ago Mixed hyperlipidemia   [place] [facility] Danelle Berry, PA-C       Future Appointments             In 5 months Danelle Berry, PA-C [facility]   In 9 months Deirdre Evener, MD [facility]

## 2023-03-22 ENCOUNTER — Encounter: Payer: Self-pay | Admitting: Internal Medicine

## 2023-05-03 ENCOUNTER — Encounter: Payer: Self-pay | Admitting: Internal Medicine

## 2023-05-09 DIAGNOSIS — G629 Polyneuropathy, unspecified: Secondary | ICD-10-CM | POA: Diagnosis not present

## 2023-05-09 DIAGNOSIS — M6530 Trigger finger, unspecified finger: Secondary | ICD-10-CM | POA: Diagnosis not present

## 2023-05-18 ENCOUNTER — Other Ambulatory Visit: Payer: Self-pay | Admitting: Physician Assistant

## 2023-05-18 DIAGNOSIS — F320 Major depressive disorder, single episode, mild: Secondary | ICD-10-CM

## 2023-05-18 NOTE — Telephone Encounter (Signed)
 Requested Prescriptions  Pending Prescriptions Disp Refills   escitalopram (LEXAPRO) 20 MG tablet [Pharmacy Med Name: ESCITALOPRAM 20 MG TABLET] 90 tablet 0    Sig: TAKE 1 TABLET BY MOUTH EVERY DAY     Psychiatry:  Antidepressants - SSRI Failed - 05/18/2023  2:51 PM      Failed - Valid encounter within last 6 months    Recent Outpatient Visits   None     Future Appointments             In 3 months Danelle Berry, PA-C [facility]   In 6 months Deirdre Evener, MD [facility]            Passed - Completed PHQ-2 or PHQ-9 in the last 360 days

## 2023-05-23 ENCOUNTER — Other Ambulatory Visit: Payer: Self-pay | Admitting: Family Medicine

## 2023-05-23 NOTE — Telephone Encounter (Signed)
 Requested medication (s) are due for refill today: yes   Requested medication (s) are on the active medication list: yes   Last refill:  11/28/22 #90 1 refills  Future visit scheduled: yes in 2 months   Notes to clinic:  last ordered by E. Mecum, PA 11/28/22. Do you want to refill Rx?     Requested Prescriptions  Pending Prescriptions Disp Refills   traZODone (DESYREL) 50 MG tablet [Pharmacy Med Name: TRAZODONE 50 MG TABLET] 180 tablet 1    Sig: TAKE 1/2 TABLET TO 2 TABLET BY MOUTH AT BEDTIME AS NEEDED FOR SLEEP     Psychiatry: Antidepressants - Serotonin Modulator Failed - 05/23/2023  3:31 PM      Failed - Valid encounter within last 6 months    Recent Outpatient Visits   None     Future Appointments             In 2 months Danelle Berry, PA-C [facility]   In 6 months Deirdre Evener, MD [facility]            Passed - Completed PHQ-2 or PHQ-9 in the last 360 days

## 2023-05-30 ENCOUNTER — Encounter: Payer: Self-pay | Admitting: Internal Medicine

## 2023-05-30 ENCOUNTER — Inpatient Hospital Stay: Payer: PPO | Attending: Internal Medicine

## 2023-05-30 ENCOUNTER — Encounter: Payer: Self-pay | Admitting: Obstetrics and Gynecology

## 2023-05-30 ENCOUNTER — Inpatient Hospital Stay: Payer: PPO | Admitting: Obstetrics and Gynecology

## 2023-05-30 VITALS — BP 141/70 | HR 69 | Temp 98.6°F | Resp 19

## 2023-05-30 DIAGNOSIS — Z8543 Personal history of malignant neoplasm of ovary: Secondary | ICD-10-CM | POA: Diagnosis not present

## 2023-05-30 DIAGNOSIS — Z90722 Acquired absence of ovaries, bilateral: Secondary | ICD-10-CM | POA: Diagnosis not present

## 2023-05-30 DIAGNOSIS — Z08 Encounter for follow-up examination after completed treatment for malignant neoplasm: Secondary | ICD-10-CM | POA: Diagnosis not present

## 2023-05-30 DIAGNOSIS — G620 Drug-induced polyneuropathy: Secondary | ICD-10-CM | POA: Diagnosis not present

## 2023-05-30 DIAGNOSIS — Z9079 Acquired absence of other genital organ(s): Secondary | ICD-10-CM | POA: Insufficient documentation

## 2023-05-30 DIAGNOSIS — Z90710 Acquired absence of both cervix and uterus: Secondary | ICD-10-CM | POA: Insufficient documentation

## 2023-05-30 NOTE — Progress Notes (Addendum)
 Gynecologic Oncology Interval Visit   Referring Provider: Dr. 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 female s/p prior hysterectomy (ovaries in situ) who is seen in consultation from Dr. Sherron Monday for ovarian mass. S/p ex-lap, BSO, biopsies, appendectomy, adhesiolysis with Dr. Sonia Side at [facility] on 09/08/21. She is s/p 6 of 6 planned cycles of adjuvant chemotherapy with carboplatin and paclitaxel, completed 01/30/22. NED since. She returns to clinic for continued surveillance.   She reports increase stress and fatigue with health issues of her husband. Her sister has passed away recently as well. Physically she feels well however and denies pelvic pain, bleeding and/or pain. She has stopped nexium and bowel movements have normalized. Her neuropathy is managed by Dr Sherryll Burger. No interval imaging or hospitalizations.   CA125 pending today.   Component Ref Range & Units (hover) 3 mo ago (02/21/23) 6 mo ago (11/15/22) 10 mo ago (07/19/22) 1 yr ago (05/03/22) 1 yr ago (01/30/22) 1 yr ago (01/09/22) 1 yr ago (12/15/21)  Cancer Antigen (CA) 125 6.6 7.0 CM 7.2 CM 7.8 CM 10.6 CM 10.3 CM 11.5 CM      Gynecologic Oncologic History [name] is a pleasant female s/p prior hysterectomy (ovaries in situ) who is seen in consultation from Dr. Sherron Monday for ovarian mass.   Patient presented to Urology for incontinence. She had elevated residual urine and ultrasound was performed which showed 12.2 x 10.1 x 2.9 cm mixed cystic and solid mass suspicious for ovarian mass.   MRI 08/04/21- Reproductive: Large cystic and solid pelvic mass centered in the pelvis. Ovarian tissue or ovary is not demonstrated on the current study. Mass in total measuring 15 x 11 x 12 cm. Soft tissue component showing restricted diffusion and enhancement centered in the lower portion of the mass shows heterogeneous T2 signal and measures 10.8 x 6.8 x 8.5 cm  amidst cystic areas. There is a small amount of associated ascites. The mass abuts the vaginal apex following hysterectomy, potentially involving the LEFT vaginal apex. The "soft tissue component displays very heterogeneous enhancement and there are numerous enhancing septations throughout. Collateral pathways from LEFT ovarian vein are noted about the LEFT lateral aspect of the mass. Given that the gonadal vein can be traced to the mass suspicion is for mass of LEFT ovarian origin though this may of parasitized flow from ovarian. Vessels on the LEFT   Other: Trace ascites. No discrete peritoneal nodularity about the pelvis.   Musculoskeletal: No suspicious bone lesion   IMPRESSION: 1. Large cystic and solid pelvic mass highly suspicious for neoplasm. Tumor may arise from the LEFT ovary based on proximity to LEFT ovarian vein though no defined normal ovarian tissue can be seen on either the RIGHT or the LEFT on today's exam. Correlate with surgical history. Sarcoma is also considered based on the marked heterogeneity seen within the lesion on the current study. There is strong restricted diffusion within and avid enhancement of the large central soft tissue component. Gyn consultation gyn consultation is suggested. 2. Trace ascites. 3. Tumor in close proximity to sigmoid colon without definitive signs of involvement also in close proximity to the urinary bladder with defined plane between urinary bladder and mass. Mass may involve the LEFT vaginal fornix. 4. Small amount of associated ascites.  She has history of hysterectomy at age 65 for endometriosis.   We recommended surgical management.  CA 125 was 526 at diagnosis  09/08/2021  Diagnostic laparoscopy, exploratory laparotomy, bilateral salpingo-oophorectomy, infracolic omentectomy, peritoneal biopsies, appendectomy with partial cecectomy (due to frozen section suggesting mucinous ovarian tumor and appendix was adherent to cecum) adhesiolysis  (including enterolysis and ovariolysis), and posterior rectus sheath blocks  with Dr Randalyn Bushman at [facility]. Mass was decompressed with controlled drainage due to large size and extensive adhesions.  She did well post operatively.   Pathology:  A. 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 Negative for surface involvement. Bilateral fallopian tubes: Negative for carcinoma.   See synoptic report.   Comment: The tumor has an unusual microcystic morphology, but immunohistochemical studies are most consistent with a high-grade serous carcinoma.   B.  Right ovary, oophorectomy: Right ovary with no pathologic diagnosis.  Negative for carcinoma.   C. Omentum, omentectomy: Benign fibroadipose tissue. Negative for carcinoma.   D. Left paracolic gutter, biopsy: Benign fibroadipose tissue and skeletal muscle. Negative for carcinoma.   E. Left pelvic, biopsy: Benign fibroadipose tissue and smooth muscle. Negative for carcinoma.   F. Anterior cul de sac, biopsy: Benign fibroadipose tissue and smooth muscle. Negative for carcinoma.   G. Right pelvic, biopsy: Benign fibrous tissue and smooth muscle. Negative for carcinoma.   H. Posterior cul de sac, biopsy: Benign fibrous tissue. Negative for carcinoma.   I. Appendix and portion of cecum, appendectomy and partial cecectomy: Appendix and portion of cecum with no pathologic diagnosis. Negative for carcinoma.  Pelvic washings - negative for malignancy  8//31/2023-01/30/2022 : Carboplatin (AUC 6) + Paclitaxel (175) q21d X 6 Cycles  04/26/2022 CT C/A/P IMPRESSION: 1. No definitive evidence of metastatic disease. 2. Minimal focal vague haziness in the ventral left lower quadrant, status post omentectomy. No discrete nodules. Recommend attention on follow-up. 3.  Aortic atherosclerosis (ICD10-I70.0). 4.  Emphysema  (ICD10-J43.9).  Genetics Assessment: Negative genetic testing. No pathogenic variants identified on the [facility] CancerNext-Expanded+RNA panel. The report date is 10/31/2021. HRD testing through [facility] was also negative and reported out 10/31/2021.   Problem List: Patient Active Problem List   Diagnosis Date Noted   Fatigue 11/28/2022   Genetic testing 11/02/2021   Ovarian cancer on left (HCC) 09/28/2021   Depression, unspecified 09/06/2021   Mild intermittent asthma 09/06/2021   Ovarian mass 08/10/2021   PSVT (paroxysmal supraventricular tachycardia) (HCC) 08/14/2019   Vitamin D deficiency 06/10/2019   Current mild episode of major depressive disorder without prior episode (HCC) 10/15/2018   GAD (generalized anxiety disorder) 10/15/2018   History of colonic polyps    Obesity (BMI 30.0-34.9) 05/29/2018   GERD (gastroesophageal reflux disease) 11/27/2017   Osteopenia 11/27/2017   Mixed hyperlipidemia 12/09/2014   Insomnia 12/09/2014   Past Medical History: Past Medical History:  Diagnosis Date   Allergy    Arthritis    Asthma    Atypical chest pain 03/14/2019   Atypical mole 06/12/2018   right prox lat thigh/mod   Atypical mole 05/19/2014   left sup med buttock/excision   Atypical mole 03/26/2014   right sup lat buttock   Atypical mole 03/26/2013   left lat mid back   Atypical mole 11/08/2006   right shoulder superior/excision   Basal cell carcinoma 06/12/2016   right medial sup canthus below eyebrow   Basal cell carcinoma 09/23/2014   left nasal bridge   Basal cell carcinoma 03/26/2013   left nasal sidewall   Basal cell carcinoma 08/27/2012   left nasal dorsum   Cancer (HCC)  shoulder, nose skin CA- basal cell CA   GERD (gastroesophageal reflux disease)    occasional   Hyperlipidemia    Osteopenia    Polyp of transverse colon    Right arm weakness 03/14/2019   Stress due to illness of family member 05/29/2018   Past Surgical History: Past Surgical  History:  Procedure Laterality Date   BILATERAL SALPINGO-OOPHORECTOMY AND OMENTECTOMY; WITH RADICAL DISSECTION FOR DEBULKING  09/08/2021   BREAST BIOPSY Right    core bx- neg   COLONOSCOPY     COLONOSCOPY WITH PROPOFOL N/A 10/08/2018   Procedure: COLONOSCOPY WITH PROPOFOL;  Surgeon: Midge Minium, MD;  Location: [facility] ENDOSCOPY;  Service: Endoscopy;  Laterality: N/A;   MOHS SURGERY     TONSILLECTOMY     VAGINAL HYSTERECTOMY     Past Gynecologic History:  Menarche: age 51 Hysterectomy at age 25.   OB History:  OB History  No obstetric history on file.   Family History: Family History  Problem Relation Age of Onset   Lung cancer Mother    Diabetes Father    Parkinson's disease Father    Alzheimer's disease Father    Heart attack Father 26   Alcohol abuse Sister    Heart disease Sister    Lung cancer Sister 35   Down syndrome Sister    Colon cancer Paternal Uncle        d. 102s   Melanoma Maternal Grandmother    Stroke Paternal Grandmother    Lung cancer Paternal Grandmother        d. 59s   Stomach cancer Paternal Grandfather        d. 51s   Liver cancer Son 27   Esophageal cancer Neg Hx    Breast cancer Neg Hx    Social History: Social History   Socioeconomic History   Marital status: Married    Spouse name: [name]   Number of children: 2   Years of education: Not on file   Highest education level: Bachelor's degree (e.g., BA, AB, BS)  Occupational History   Occupation: Retired  Tobacco Use   Smoking status: Former    Current packs/day: 0.00    Average packs/day: 1.5 packs/day for 20.0 years (30.0 ttl pk-yrs)    Types: Cigarettes    Start date: 55    Quit date: 1993    Years since quitting: 32.3    Passive exposure: Past   Smokeless tobacco: Never  Vaping Use   Vaping status: Never Used  Substance and Sexual Activity   Alcohol use: Yes    Alcohol/week: 1.0 standard drink of alcohol    Types: 1 Glasses of wine per week    Comment: one glass of wine  once a month   Drug use: Never   Sexual activity: Not Currently  Other Topics Concern   Not on file  Social History Narrative   Lives with husband in ; quit smoking > 30 years; once every 1-2 months; retd. Teacher- elementary school;    Social Drivers of Health   Financial Resource Strain: Low Risk  (05/09/2022)   Overall Financial Resource Strain (CARDIA)    Difficulty of Paying Living Expenses: Not hard at all  Food Insecurity: No Food Insecurity (05/09/2022)   Hunger Vital Sign    Worried About Running Out of Food in the Last Year: Never true    Ran Out of Food in the Last Year: Never true  Transportation Needs: No Transportation Needs (05/09/2022)   PRAPARE - Transportation  Lack of Transportation (Medical): No    Lack of Transportation (Non-Medical): No  Physical Activity: Insufficiently Active (05/09/2022)   Exercise Vital Sign    Days of Exercise per Week: 3 days    Minutes of Exercise per Session: 20 min  Stress: Stress Concern Present (05/09/2022)   [employer] of Occupational Health - Occupational Stress Questionnaire    Feeling of Stress : To some extent  Social Connections: Socially Integrated (05/09/2022)   Social Connection and Isolation Panel [NHANES]    Frequency of Communication with Friends and Family: Three times a week    Frequency of Social Gatherings with Friends and Family: Three times a week    Attends Religious Services: More than 4 times per year    Active Member of Clubs or Organizations: Yes    Attends Banker Meetings: More than 4 times per year    Marital Status: Married  Catering manager Violence: Not At Risk (03/31/2022)   Humiliation, Afraid, Rape, and Kick questionnaire    Fear of Current or Ex-Partner: No    Emotionally Abused: No    Physically Abused: No    Sexually Abused: No   Allergies: Allergies  Allergen Reactions   Tetanus Toxoids Shortness Of Breath   Sulfa Antibiotics     Severe vomiting   Current  Medications: Current Outpatient Medications  Medication Sig Dispense Refill   acetaminophen (TYLENOL) 325 MG tablet Take 650 mg by mouth every 6 (six) hours as needed.     Ascorbic Acid (VITAMIN C) 1000 MG tablet Take 1,000 mg by mouth daily. (Patient not taking: Reported on 01/16/2023)     busPIRone (BUSPAR) 5 MG tablet TAKE ONE TABLET IN THE AM AND THEN YOU CAN TAKE A SECOND AS NEEDED DURING THE DAY 180 tablet 1   escitalopram (LEXAPRO) 20 MG tablet TAKE 1 TABLET BY MOUTH EVERY DAY 90 tablet 0   famotidine (PEPCID) 10 MG tablet Take 1 tablet (10 mg total) by mouth 2 (two) times daily as needed for heartburn or indigestion.     hydrOXYzine (ATARAX) 25 MG tablet Take 1 tablet (25 mg total) by mouth every 8 (eight) hours as needed for anxiety (insomnia). 90 tablet 1   lovastatin (MEVACOR) 20 MG tablet TAKE 1 TABLET BY MOUTH EVERYDAY AT BEDTIME 90 tablet 3   melatonin 3 MG TABS tablet Take 3 mg by mouth at bedtime.     meloxicam (MOBIC) 7.5 MG tablet Take 7.5 mg by mouth as needed.     metoprolol succinate (TOPROL-XL) 25 MG 24 hr tablet Take 1 tablet (25 mg total) by mouth as needed (for palpitations). 30 tablet 6   traZODone (DESYREL) 50 MG tablet TAKE 1/2 TABLET TO 2 TABLET BY MOUTH AT BEDTIME AS NEEDED FOR SLEEP 180 tablet 1   vitamin B-12 (CYANOCOBALAMIN) 1000 MCG tablet Take 1,000 mcg by mouth daily.     VITAMIN D, CHOLECALCIFEROL, PO Take 500 mg by mouth daily.     No current facility-administered medications for this visit.    Review of Systems General:  fatigue & stress Skin: no complaints Eyes: no complaints HEENT: no complaints Breasts: no complaints Pulmonary: shortness of breath- stable Cardiac: no complaints Gastrointestinal: no complaints Genitourinary/Sexual: no complaints Ob/Gyn: no complaints Musculoskeletal: no complaints Hematology: no complaints Neurologic/Psych: no complaints  Objective:  Physical Examination:  BP (!) 141/70   Pulse 69   Temp 98.6 F (37 C)    Resp 19   LMP  (LMP Unknown)   SpO2 97%  ECOG Performance Status: 0 - Asymptomatic  GENERAL: Patient is a well appearing female in no acute distress HEENT:  Sclera clear. Anicteric NODES:  Negative axillary, supraclavicular, inguinal lymph node survery LUNGS:  Clear to auscultation bilaterally.   HEART:  Regular rate and rhythm.  ABDOMEN:  Soft, nontender.  No hernias, incisions well healed. No masses or ascites EXTREMITIES:  No peripheral edema. Atraumatic. No cyanosis SKIN:  Clear with no obvious rashes or skin changes.  NEURO:  Nonfocal. Well oriented.  Appropriate affect.  Pelvic: Exam chaperoned by CMA EGBUS: no lesions.  Vagina: no bleeding, discharge, or lesions.   Cervix: surgically absent Uterus: surgically absent BME: no palpable masses Rectovaginal: deferred   Lab Review:  CA 125 per HPI. Today's result pending.   Radiologic Imaging: No imaging on site.    Assessment:  [name] is a 78 y.o. female with complex 15 cm pelvic mass found to have stage Ic high grade serous cancer of the ovary.  In 7/23 had diagnostic laparoscopy, ex lap, BSO, infracolic omentectomy, peritoneal biopsies, LOA, appendectomy with partial cecectomy (due to frozen section suggesting mucinous ovarian tumor and appendix was adherent to cecum). She had prior hysterectomy. Pathology consistent with stage IC high grade serous ovarian cancer of left ovary based on controlled drainage of the mass.  She received 6 cycles of adjuvant carboplatin-paclitaxel. CA 125 was 526 at diagnosis. CT 04/2022 no definitive recurrence. CA 125 was down to 7 in October 2024. Asymptomatic. CA 125 pending. Clinically, NED today.   Germline panel testing and tumor HRD testing negative.   Medical co-morbidities complicating care: prior vaginal hysterectomy; asthma, and prior skin cancers Plan:   Problem List Items Addressed This Visit   None Visit Diagnoses       Encounter for follow-up surveillance of ovarian  cancer    -  Primary      Return to clinic in 3 months for continued surveillance with pelvic exam and labs to monitor CA125. We again reviewed NCCN surveillance guidelines including exams every 3 months for first two to three years then every 6 months through year 5 then annually thereafter. We again reviewed symptoms that would be concerning for recurrent disease.  She will follow up with her primary care doctor for ongoing wellness, management of other medical comorbidities, and cancer screenings.   The patient's diagnosis, an outline of the further diagnostic and laboratory studies which will be required, the recommendation for surgery, and alternatives were discussed with her and her accompanying family members.  All questions were answered to their satisfaction.  Consuello Masse, DNP, AGNP-C, AOCNP [facility] [phone] (clinic)  I personally had a face to face interaction and evaluated the patient jointly with the NP, Ms. Consuello Masse.  I have reviewed her history and available records and have performed the key portions of the history, exam, and plan with my findings confirming those documented above by the APP.  I have discussed the case with the APP and the patient.  I agree with the above documentation, assessment and plan which was fully formulated by me.   I personally saw the patient and performed a substantive portion of this encounter in conjunction with the listed APP as documented above.  Raiyah Speakman Leta Jungling, MD

## 2023-05-30 NOTE — Addendum Note (Signed)
 Addended by: Zakary Kimura G on: 05/30/2023 10:22 AM   Modules accepted: Orders

## 2023-05-31 ENCOUNTER — Other Ambulatory Visit: Payer: Self-pay | Admitting: Family Medicine

## 2023-05-31 ENCOUNTER — Encounter: Payer: Self-pay | Admitting: Nurse Practitioner

## 2023-05-31 DIAGNOSIS — E782 Mixed hyperlipidemia: Secondary | ICD-10-CM

## 2023-05-31 LAB — CA 125: Cancer Antigen (CA) 125: 7.1 U/mL (ref 0.0–38.1)

## 2023-05-31 NOTE — Telephone Encounter (Signed)
 Requested medications are due for refill today.  yes  Requested medications are on the active medications list.  yes  Last refill. 06/12/2022 #90 3 rf  Future visit scheduled.   yes  Notes to clinic.  Labs are expired.    Requested Prescriptions  Pending Prescriptions Disp Refills   lovastatin (MEVACOR) 20 MG tablet [Pharmacy Med Name: LOVASTATIN 20 MG TABLET] 90 tablet 3    Sig: TAKE 1 TABLET BY MOUTH EVERYDAY AT BEDTIME     Cardiovascular:  Antilipid - Statins 2 Failed - 05/31/2023  5:38 PM      Failed - Valid encounter within last 12 months    Recent Outpatient Visits   None     Future Appointments             In 2 months Adeline Hone, PA-C [facility]   In 6 months Elta Halter, MD [facility]            Failed - Lipid Panel in normal range within the last 12 months    Cholesterol, Total  Date Value Ref Range Status  07/07/2015 158 100 - 199 mg/dL Final   Cholesterol  Date Value Ref Range Status  05/10/2022 182 <200 mg/dL Final   LDL Cholesterol (Calc)  Date Value Ref Range Status  05/10/2022 86 mg/dL (calc) Final    Comment:    Reference range: <100 . Desirable range <100 mg/dL for primary prevention;   <70 mg/dL for patients with CHD or diabetic patients  with > or = 2 CHD risk factors. Aaron Aas LDL-C is now calculated using the Martin-Hopkins  calculation, which is a validated novel method providing  better accuracy than the Friedewald equation in the  estimation of LDL-C.  Melinda Sprawls et al. Erroll Heard. 1610;960(45): 2061-2068  (http://education.[facility].com/faq/FAQ164)    HDL  Date Value Ref Range Status  05/10/2022 48 (L) > OR = 50 mg/dL Final  40/98/1191 53 >47 mg/dL Final   Triglycerides  Date Value Ref Range Status  05/10/2022 358 (H) <150 mg/dL Final    Comment:    . If a non-fasting specimen was collected, consider repeat triglyceride testing on a fasting specimen if clinically  indicated.  Imagene Mam et al. J. of Clin. Lipidol. 2015;9:129-169. Aaron Aas          Passed - Cr in normal range and within 360 days    Creat  Date Value Ref Range Status  11/28/2022 0.98 0.60 - 1.00 mg/dL Final         Passed - Patient is not pregnant

## 2023-06-01 ENCOUNTER — Other Ambulatory Visit: Payer: Self-pay | Admitting: Cardiology

## 2023-06-29 DIAGNOSIS — M65341 Trigger finger, right ring finger: Secondary | ICD-10-CM | POA: Diagnosis not present

## 2023-07-02 ENCOUNTER — Other Ambulatory Visit: Payer: Self-pay | Admitting: Family Medicine

## 2023-07-02 DIAGNOSIS — Z1231 Encounter for screening mammogram for malignant neoplasm of breast: Secondary | ICD-10-CM

## 2023-07-06 IMAGING — MR MR HEAD W/O CM
11 of 16 series · 18 of 48 positions shown · non-contrast
Comparison: Prior brain MRI examinations 01/12/2010 and earlier.

CLINICAL DATA: Memory loss.

EXAM:
MRI HEAD WITHOUT CONTRAST
TECHNIQUE: Multiplanar, multiecho pulse sequences of the brain and surrounding
structures were obtained without intravenous contrast.
Additionally, using NeuroQuant software a 3D volumetric analysis of
the brain was performed and is compared to a normative database
adjusted for age, gender and intracranial volume.

[Series 3: DWI · axial · 3.0mm · 0.94mm/px · z∈[-79,+71]mm · 2 of 102 slices shown (1 of 2)]
[im 1/102]
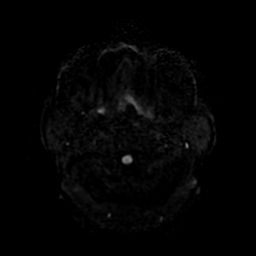
[im 102/102]
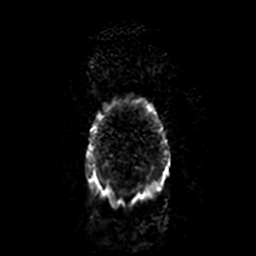

[Series 4: DWI · coronal · 4.0mm · 0.94mm/px · 2 of 72 slices shown (2 of 2)]
[im 1/72]
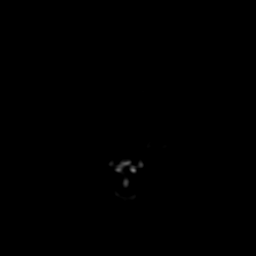
[im 72/72]
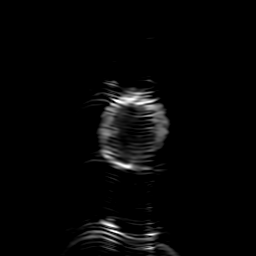

[Series 5: FLAIR · sagittal · 5.0mm · 0.23mm/px · 1 of 25 slices shown (1 of 2)]
[im 1/25]
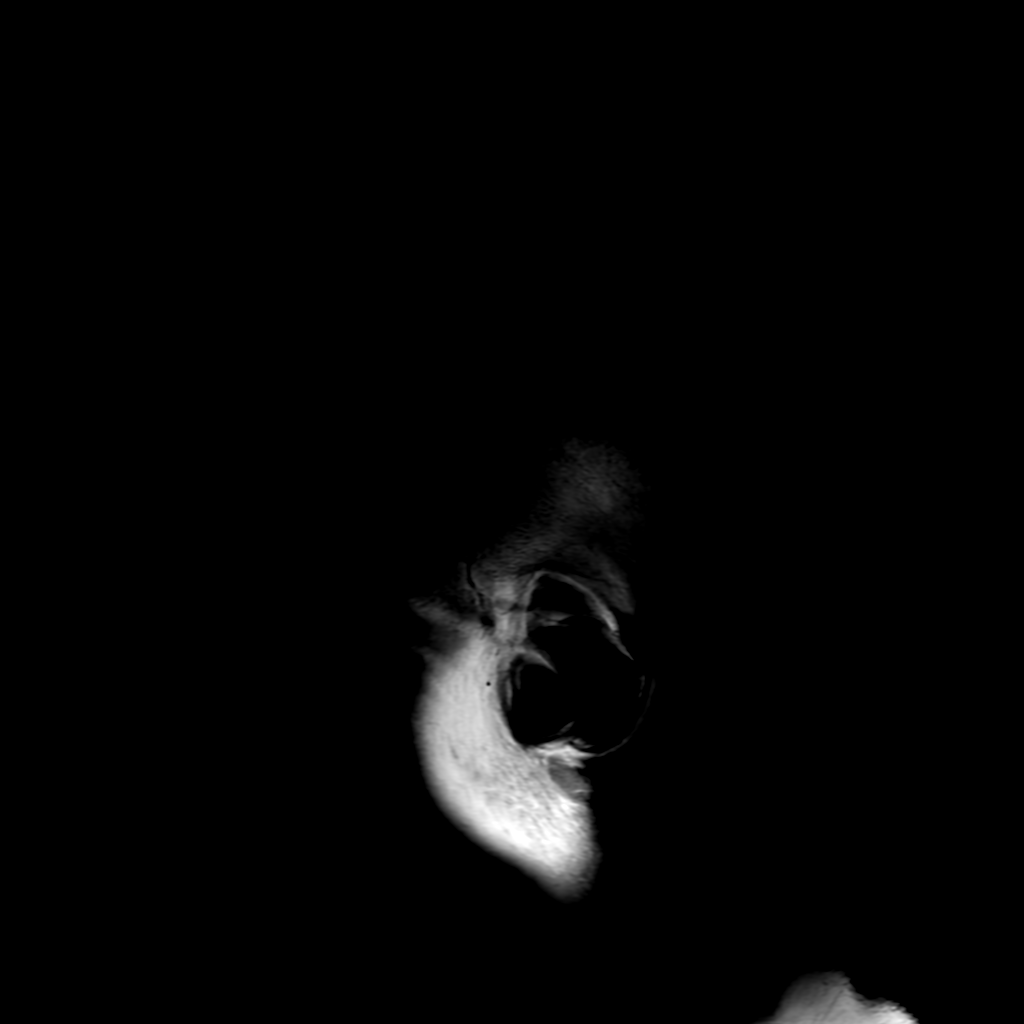

[Series 6: T2 · axial · 5.0mm · 0.23mm/px · 1 of 26 slices shown (1 of 2)]
[im 1/26]
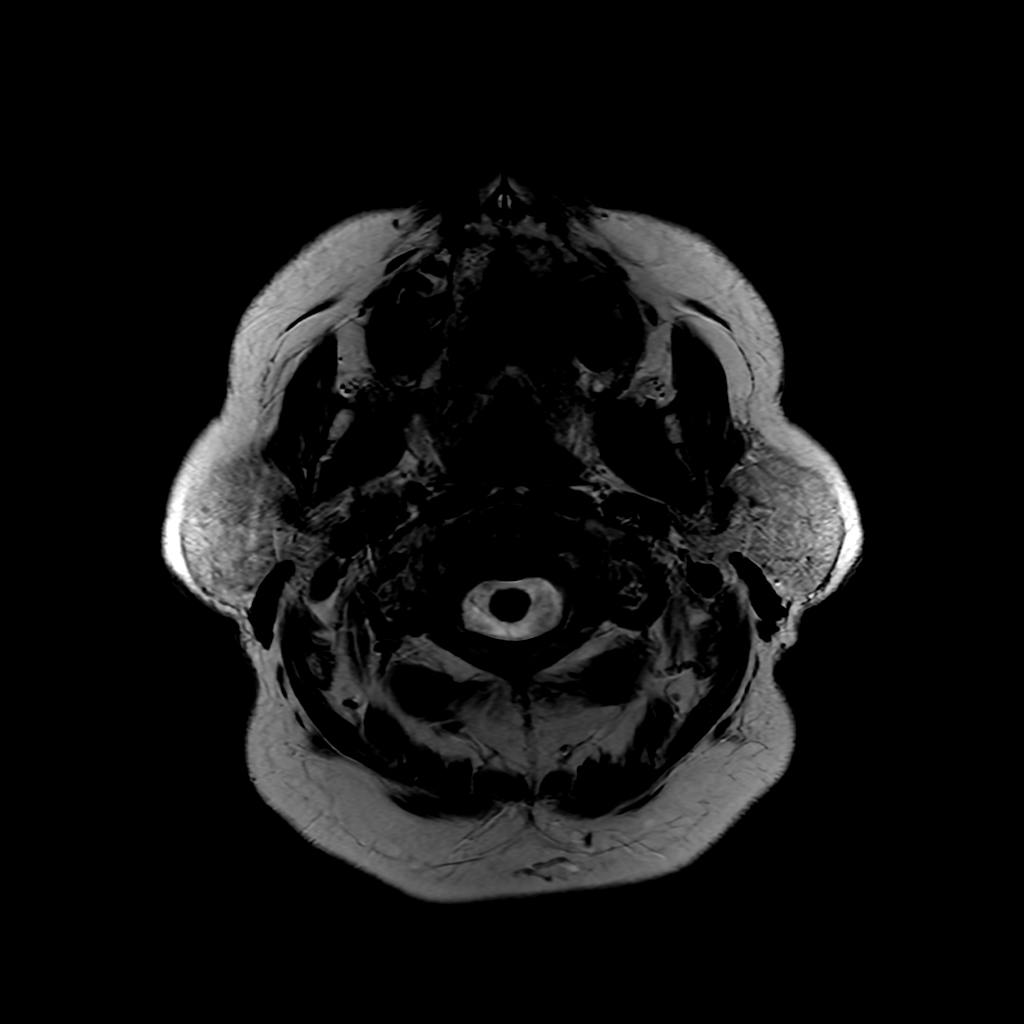

[Series 7: FLAIR · axial · 4.0mm · 0.47mm/px · 1 of 35 slices shown (2 of 2)]
[im 1/35]
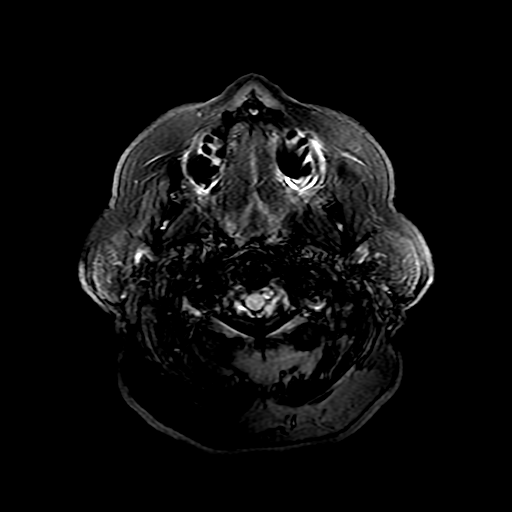

[Series 8: (person_name) · axial · 3.0mm · 0.47mm/px · z∈[-79,+76]mm · 3 of 104 slices shown]
[im 1/104]
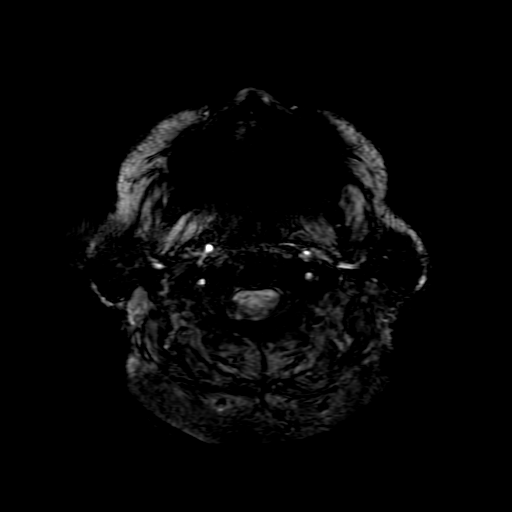
[im 52/104]
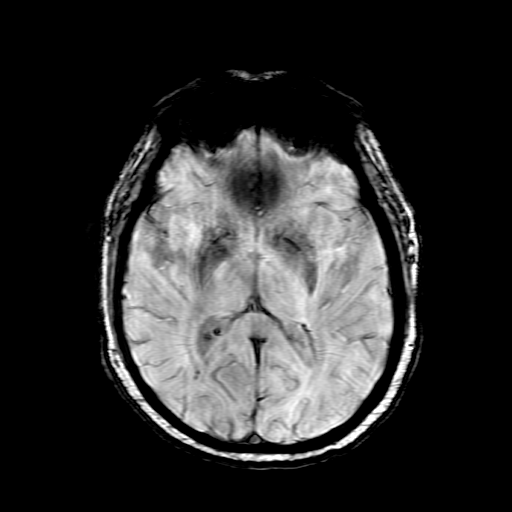
[im 104/104]
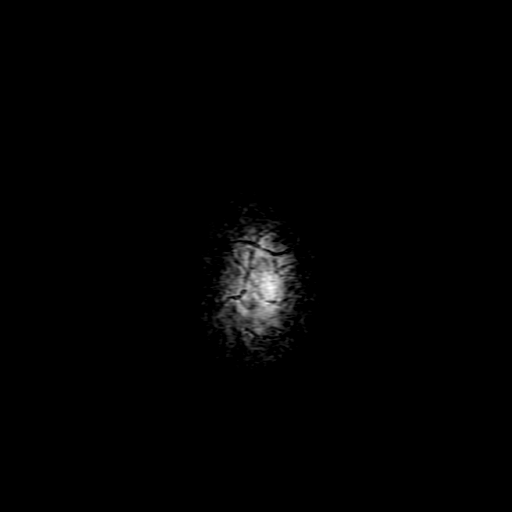

[Series 9: ax 3(person_name) pre · axial · non-contrast · 3.0mm · 0.94mm/px · 1 of 52 slices shown]
[im 1/52]
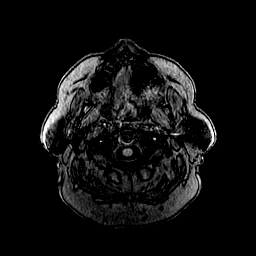

[Series 10: T2 · coronal · 5.0mm · 0.20mm/px · 1 of 29 slices shown (2 of 2)]
[im 1/29]
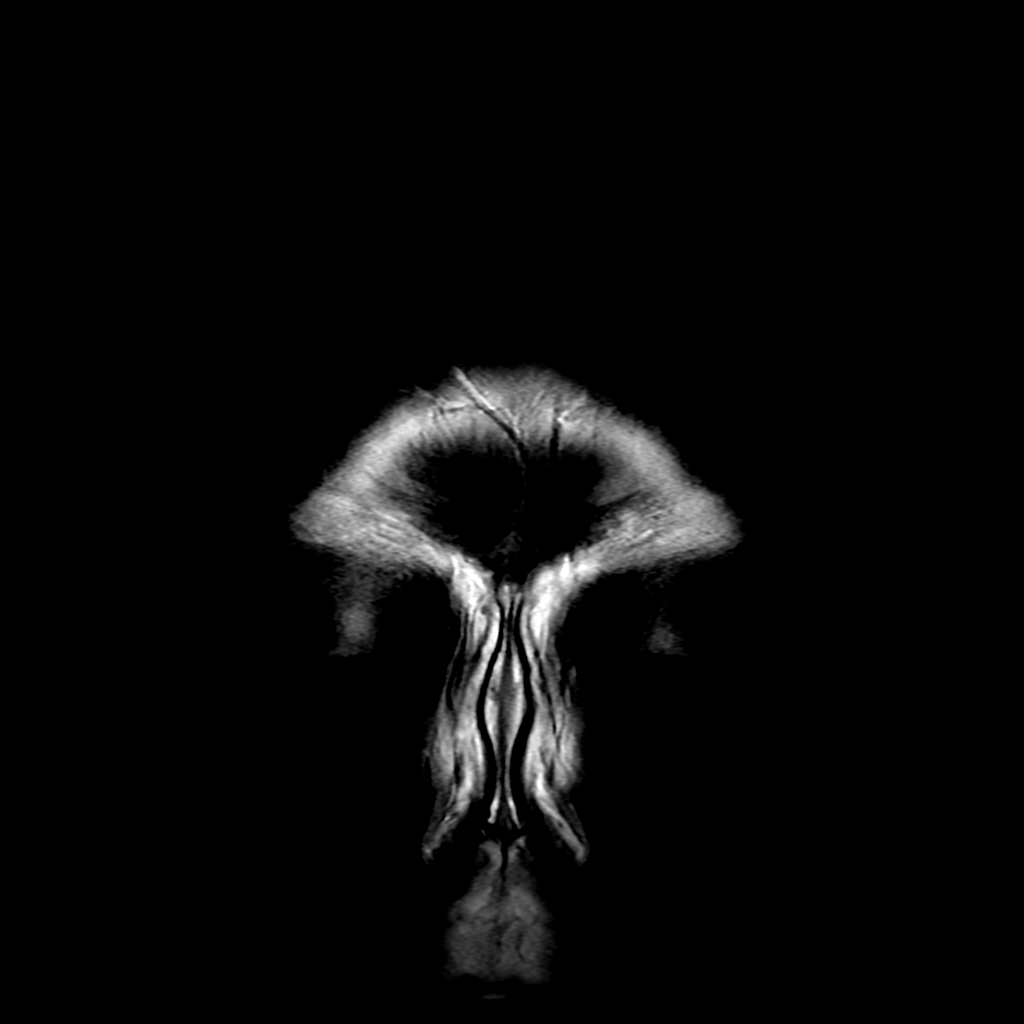

[Series 52: nqsegcorsc · 1.00mm/px · 4 of 230 slices shown]
[im 1/230]
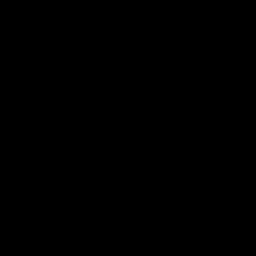
[im 46/230]
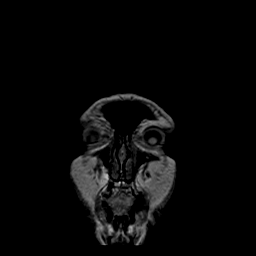
[im 92/230]
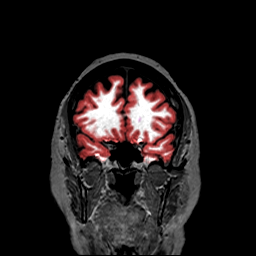
[im 138/230]
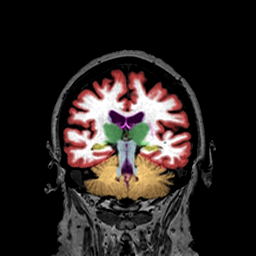

[Series 350: ADC · axial · 3.0mm · 0.94mm/px · 1 of 51 slices shown (1 of 2)]
[im 1/51]
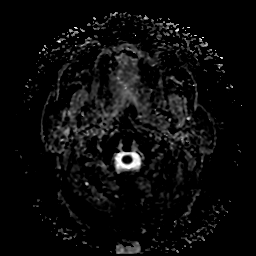

[Series 450: ADC · coronal · 4.0mm · 0.94mm/px · 1 of 36 slices shown (2 of 2)]
[im 1/36]
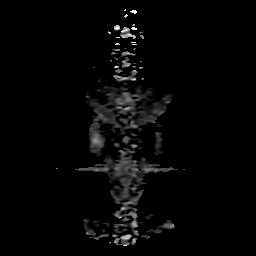

[18 of 48 positions shown; findings below may reference images not displayed]

FINDINGS: Brain:

Subjectively, no more than mild generalized cerebral atrophy is
appreciated.

Minimal multifocal T2 FLAIR hyperintense signal abnormality within
the cerebral white matter, nonspecific but compatible with chronic
small vessel ischemic disease.

There are a few clustered chronic parenchymal microhemorrhages
within the right parietal lobe, nonspecific. Additional punctate
chronic microhemorrhage within the medial right cerebellar
hemisphere. A punctate focus of SWI signal loss near the left
foramen of Luschka and likely reflects choroid plexus calcification.

There is no acute infarct.

No evidence of an intracranial mass.

No extra-axial fluid collection.

No midline shift.

Vascular: Maintained flow voids within the proximal large arterial
vessels.

Skull and upper cervical spine: No focal suspicious marrow lesion.
Redemonstrated probable benign hemangioma within the left frontal
calvarium (series 9, image 34).

Sinuses/Orbits: Visualized orbits show no acute finding. Minimal
mucosal thickening within the bilateral ethmoid sinuses. Mild
mucosal thickening within the left maxillary sinus.

Other: Trace fluid within the left mastoid air cells.

NeuroQuant Findings:

Volumetric analysis of the brain was performed, with a fully
detailed report in [HOSPITAL] PACS. Briefly, the comparison with age and
gender matched reference reveals a whole brain volume normative
percentile of 30.
IMPRESSION: No evidence of acute intracranial abnormality.

Minimal chronic small-vessel ischemic changes within the cerebral
white matter, slightly progressed from the brain MRI of 01/13/2010.

NeuroQuant volumetric analysis of the brain, see details on [HOSPITAL]
PACS. Briefly, the comparison with age and gender matched reference
reveals a whole brain volume normative percentile of 30. However,
subjectively, no more than mild generalized cerebral atrophy is
appreciated.

Mild paranasal sinus disease, as described.

Trace fluid within the left mastoid air cells.

## 2023-07-31 ENCOUNTER — Ambulatory Visit
Admission: RE | Admit: 2023-07-31 | Discharge: 2023-07-31 | Disposition: A | Discharge status: Home / Self Care | Source: Ambulatory Visit | Attending: Family Medicine | Admitting: Family Medicine

## 2023-07-31 ENCOUNTER — Inpatient Hospital Stay: Admission: RE | Admit: 2023-07-31 | Source: Ambulatory Visit

## 2023-07-31 DIAGNOSIS — M180 Bilateral primary osteoarthritis of first carpometacarpal joints: Secondary | ICD-10-CM | POA: Diagnosis not present

## 2023-07-31 DIAGNOSIS — Z1231 Encounter for screening mammogram for malignant neoplasm of breast: Secondary | ICD-10-CM | POA: Insufficient documentation

## 2023-07-31 DIAGNOSIS — M65341 Trigger finger, right ring finger: Secondary | ICD-10-CM | POA: Diagnosis not present

## 2023-07-31 DIAGNOSIS — M720 Palmar fascial fibromatosis [Dupuytren]: Secondary | ICD-10-CM | POA: Diagnosis not present

## 2023-07-31 DIAGNOSIS — M65331 Trigger finger, right middle finger: Secondary | ICD-10-CM | POA: Diagnosis not present

## 2023-08-11 ENCOUNTER — Other Ambulatory Visit: Payer: Self-pay | Admitting: Family Medicine

## 2023-08-11 DIAGNOSIS — F320 Major depressive disorder, single episode, mild: Secondary | ICD-10-CM

## 2023-08-13 NOTE — Telephone Encounter (Signed)
 Requested Prescriptions  Pending Prescriptions Disp Refills   escitalopram  (LEXAPRO ) 20 MG tablet [Pharmacy Med Name: ESCITALOPRAM  20 MG TABLET] 90 tablet 0    Sig: TAKE 1 TABLET BY MOUTH EVERY DAY     Psychiatry:  Antidepressants - SSRI Failed - 08/13/2023  3:12 PM      Failed - Valid encounter within last 6 months    Recent Outpatient Visits   None     Future Appointments             In 1 week Tapia, Leisa, PA-C [facility]   In 3 months Hester Alm BROCKS, MD [facility]            Passed - Completed PHQ-2 or PHQ-9 in the last 360 days

## 2023-08-17 ENCOUNTER — Other Ambulatory Visit: Payer: Self-pay

## 2023-08-20 ENCOUNTER — Ambulatory Visit: Payer: Self-pay | Admitting: Family Medicine

## 2023-08-20 ENCOUNTER — Encounter: Payer: Self-pay | Admitting: Family Medicine

## 2023-08-20 VITALS — BP 118/68 | HR 75 | Resp 16 | Ht 62.0 in | Wt 183.0 lb

## 2023-08-20 DIAGNOSIS — F5105 Insomnia due to other mental disorder: Secondary | ICD-10-CM | POA: Diagnosis not present

## 2023-08-20 DIAGNOSIS — F40240 Claustrophobia: Secondary | ICD-10-CM

## 2023-08-20 DIAGNOSIS — F320 Major depressive disorder, single episode, mild: Secondary | ICD-10-CM | POA: Diagnosis not present

## 2023-08-20 DIAGNOSIS — Z1211 Encounter for screening for malignant neoplasm of colon: Secondary | ICD-10-CM

## 2023-08-20 DIAGNOSIS — F99 Mental disorder, not otherwise specified: Secondary | ICD-10-CM

## 2023-08-20 DIAGNOSIS — F411 Generalized anxiety disorder: Secondary | ICD-10-CM | POA: Diagnosis not present

## 2023-08-20 MED ORDER — BUSPIRONE HCL 5 MG PO TABS: ORAL_TABLET | ORAL | 1 refills | Status: DC

## 2023-08-20 MED ORDER — ESCITALOPRAM OXALATE 20 MG PO TABS: 20.0000 mg | ORAL_TABLET | Freq: Every day | ORAL | 1 refills | Status: DC

## 2023-08-20 MED ORDER — LORAZEPAM 1 MG PO TABS: 0.5000 mg | ORAL_TABLET | Freq: Every day | ORAL | 0 refills | Status: DC | PRN

## 2023-08-20 MED ORDER — HYDROXYZINE HCL 25 MG PO TABS: 25.0000 mg | ORAL_TABLET | Freq: Three times a day (TID) | ORAL | 1 refills | Status: AC | PRN

## 2023-08-20 NOTE — Progress Notes (Signed)
 Name: [name]   MRN: [id number]    DOB: [birth year]   Date:08/20/2023       Progress Note  Chief Complaint  Patient presents with   Medical Management of Chronic Issues   Depression   Anxiety     Subjective:   [name] is a 78 y.o. female, presents to clinic for routine follow up on chronic conditions  Refill on anxiety/insomnia/depression meds and she is concerned about fear with flying and an upcoming international trip with husband and another couple Last flights she got super anxious, clausterphobic had palpitations and vomiting      08/20/2023    9:02 AM 02/20/2023    9:16 AM 01/09/2023   10:02 AM  Depression screen PHQ 2/9  Decreased Interest 0 0 1  Down, Depressed, Hopeless 0 0 1  PHQ - 2 Score 0 0 2  Altered sleeping 1 1 1   Tired, decreased energy 1 1 1   Change in appetite 0 0 1  Feeling bad or failure about yourself  0 0 0  Trouble concentrating 0 0 1  Moving slowly or fidgety/restless 0 0 0  Suicidal thoughts 0 0 0  PHQ-9 Score 2 2 6   Difficult doing work/chores   Somewhat difficult      08/20/2023    9:03 AM 02/20/2023    9:16 AM 01/09/2023   10:02 AM 11/28/2022    1:49 PM  GAD 7 : Generalized Anxiety Score  Nervous, Anxious, on Edge 1 1 2 1   Control/stop worrying 1 1 2 1   Worry too much - different things 0 0 2 1  Trouble relaxing 0 0 2 1  Restless 0 0 2 1  Easily annoyed or irritable 1 1 0 1  Afraid - awful might happen 0 0 2 0  Total GAD 7 Score 3 3 12 6   Anxiety Difficulty   Somewhat difficult Somewhat difficult        Current Outpatient Medications:    acetaminophen (TYLENOL) 325 MG tablet, Take 650 mg by mouth every 6 (six) hours as needed., Disp: , Rfl:    busPIRone  (BUSPAR ) 5 MG tablet, TAKE ONE TABLET IN THE AM AND THEN YOU CAN TAKE A SECOND AS NEEDED DURING THE DAY, Disp: 180 tablet, Rfl: 1   escitalopram  (LEXAPRO ) 20 MG tablet, TAKE 1 TABLET BY MOUTH EVERY DAY, Disp: 90 tablet, Rfl: 0   famotidine  (PEPCID ) 10 MG tablet, Take 1  tablet (10 mg total) by mouth 2 (two) times daily as needed for heartburn or indigestion., Disp: , Rfl:    hydrOXYzine  (ATARAX ) 25 MG tablet, Take 1 tablet (25 mg total) by mouth every 8 (eight) hours as needed for anxiety (insomnia)., Disp: 90 tablet, Rfl: 1   lovastatin  (MEVACOR ) 20 MG tablet, TAKE 1 TABLET BY MOUTH EVERYDAY AT BEDTIME, Disp: 90 tablet, Rfl: 1   melatonin 3 MG TABS tablet, Take 3 mg by mouth at bedtime., Disp: , Rfl:    meloxicam (MOBIC) 7.5 MG tablet, Take 7.5 mg by mouth as needed., Disp: , Rfl:    metoprolol  succinate (TOPROL -XL) 25 MG 24 hr tablet, TAKE 1 TABLET (25 MG TOTAL) BY MOUTH AS NEEDED (FOR PALPITATIONS)., Disp: 90 tablet, Rfl: 3   traZODone  (DESYREL ) 50 MG tablet, TAKE 1/2 TABLET TO 2 TABLET BY MOUTH AT BEDTIME AS NEEDED FOR SLEEP, Disp: 180 tablet, Rfl: 1   vitamin B-12 (CYANOCOBALAMIN ) 1000 MCG tablet, Take 1,000 mcg by mouth daily., Disp: , Rfl:    VITAMIN  D, CHOLECALCIFEROL, PO, Take 500 mg by mouth daily., Disp: , Rfl:    Ascorbic Acid (VITAMIN C) 1000 MG tablet, Take 1,000 mg by mouth daily. (Patient not taking: Reported on 08/20/2023), Disp: , Rfl:   Patient Active Problem List   Diagnosis Date Noted   Fatigue 11/28/2022   Genetic testing 11/02/2021   Ovarian cancer on left (HCC) 09/28/2021   Depression, unspecified 09/06/2021   Mild intermittent asthma 09/06/2021   Ovarian mass 08/10/2021   PSVT (paroxysmal supraventricular tachycardia) (HCC) 08/14/2019   Vitamin D  deficiency 06/10/2019   Current mild episode of major depressive disorder without prior episode (HCC) 10/15/2018   GAD (generalized anxiety disorder) 10/15/2018   History of colonic polyps    Obesity (BMI 30.0-34.9) 05/29/2018   GERD (gastroesophageal reflux disease) 11/27/2017   Osteopenia 11/27/2017   Mixed hyperlipidemia 12/09/2014   Insomnia 12/09/2014    Past Surgical History:  Procedure Laterality Date   BILATERAL SALPINGO-OOPHORECTOMY AND OMENTECTOMY; WITH RADICAL DISSECTION  FOR DEBULKING  09/08/2021   BREAST BIOPSY Right    core bx- neg   COLONOSCOPY     COLONOSCOPY WITH PROPOFOL  N/A 10/08/2018   Procedure: COLONOSCOPY WITH PROPOFOL ;  Surgeon: Jinny Carmine, MD;  Location: [facility];  Service: Endoscopy;  Laterality: N/A;   MOHS SURGERY     TONSILLECTOMY     VAGINAL HYSTERECTOMY      Family History  Problem Relation Age of Onset   Lung cancer Mother    Diabetes Father    Parkinson's disease Father    Alzheimer's disease Father    Heart attack Father 41   Alcohol abuse Sister    Heart disease Sister    Lung cancer Sister 58   Down syndrome Sister    Colon cancer Paternal Uncle        d. 48s   Melanoma Maternal Grandmother    Stroke Paternal Grandmother    Lung cancer Paternal Grandmother        d. 61s   Stomach cancer Paternal Grandfather        d. 82s   Liver cancer Son 30   Esophageal cancer Neg Hx    Breast cancer Neg Hx     Social History   Tobacco Use   Smoking status: Former    Current packs/day: 0.00    Average packs/day: 1.5 packs/day for 20.0 years (30.0 ttl pk-yrs)    Types: Cigarettes    Start date: 8    Quit date: 43    Years since quitting: 32.5    Passive exposure: Past   Smokeless tobacco: Never  Vaping Use   Vaping status: Never Used  Substance Use Topics   Alcohol use: Yes    Alcohol/week: 1.0 standard drink of alcohol    Types: 1 Glasses of wine per week    Comment: one glass of wine once a month   Drug use: Never     Allergies  Allergen Reactions   Tetanus Toxoids Shortness Of Breath   Sulfa Antibiotics     Severe vomiting    Health Maintenance  Topic Date Due   Colonoscopy  10/07/2021   Medicare Annual Wellness (AWV)  04/01/2023   COVID-19 Vaccine (6 - Pfizer risk 2024-25 season) 09/01/2023 (Originally 04/26/2023)   INFLUENZA VACCINE  09/14/2023   MAMMOGRAM  07/30/2024   Pneumococcal Vaccine: 50+ Years  Completed   DEXA SCAN  Completed   Hepatitis C Screening  Completed   Zoster  Vaccines- Shingrix  Completed   Hepatitis B  Vaccines  Aged Out   HPV VACCINES  Aged Out   Meningococcal B Vaccine  Aged Out   DTaP/Tdap/Td  Discontinued    Chart Review Today: I personally reviewed active problem list, medication list, allergies, family history, social history, health maintenance, notes from last encounter, lab results, imaging with the patient/caregiver today.   Review of Systems  Constitutional: Negative.   HENT: Negative.    Eyes: Negative.   Respiratory: Negative.    Cardiovascular: Negative.   Gastrointestinal: Negative.   Endocrine: Negative.   Genitourinary: Negative.   Musculoskeletal: Negative.   Skin: Negative.   Allergic/Immunologic: Negative.   Neurological: Negative.   Hematological: Negative.   Psychiatric/Behavioral: Negative.    All other systems reviewed and are negative.    Objective:   Vitals:   08/20/23 0855  BP: 118/68  Pulse: 75  Resp: 16  SpO2: 94%  Weight: 183 lb (83 kg)  Height: 5' 2 (1.575 m)    Body mass index is 33.47 kg/m.  Physical Exam Vitals and nursing note reviewed.  Constitutional:      General: She is not in acute distress.    Appearance: Normal appearance. She is well-developed. She is obese. She is not ill-appearing, toxic-appearing or diaphoretic.  HENT:     Head: Normocephalic and atraumatic.     Right Ear: External ear normal.     Left Ear: External ear normal.     Nose: Nose normal.  Eyes:     General: No scleral icterus.       Right eye: No discharge.        Left eye: No discharge.     Conjunctiva/sclera: Conjunctivae normal.  Neck:     Trachea: No tracheal deviation.  Cardiovascular:     Rate and Rhythm: Normal rate.  Pulmonary:     Effort: Pulmonary effort is normal. No respiratory distress.     Breath sounds: No stridor.  Skin:    General: Skin is warm and dry.     Findings: No rash.  Neurological:     Mental Status: She is alert.     Motor: No abnormal muscle tone.      Coordination: Coordination normal.     Gait: Gait normal.  Psychiatric:        Mood and Affect: Mood normal.        Behavior: Behavior normal.      Functional Status Survey:   Results for orders placed or performed in visit on 05/30/23  CA 125   Collection Time: 05/30/23  8:51 AM  Result Value Ref Range   Cancer Antigen (CA) 125 7.1 0.0 - 38.1 U/mL      Assessment & Plan:   Problem List Items Addressed This Visit     Insomnia   Sx managed with trazodone , still working well      Current mild episode of major depressive disorder without prior episode (HCC) - Primary   Doing well with current meds and doses    08/20/2023    9:02 AM 02/20/2023    9:16 AM 01/09/2023   10:02 AM  Depression screen PHQ 2/9  Decreased Interest 0 0 1  Down, Depressed, Hopeless 0 0 1  PHQ - 2 Score 0 0 2  Altered sleeping 1 1 1   Tired, decreased energy 1 1 1   Change in appetite 0 0 1  Feeling bad or failure about yourself  0 0 0  Trouble concentrating 0 0 1  Moving slowly or fidgety/restless 0 0  0  Suicidal thoughts 0 0 0  PHQ-9 Score 2 2 6   Difficult doing work/chores   Somewhat difficult         Relevant Medications      escitalopram  (LEXAPRO ) 20 MG tablet         GAD (generalized anxiety disorder)   Anxiety sx well controlled with hydroxyzine , lexapro     08/20/2023    9:03 AM 02/20/2023    9:16 AM 01/09/2023   10:02 AM 11/28/2022    1:49 PM  GAD 7 : Generalized Anxiety Score  Nervous, Anxious, on Edge 1 1 2 1   Control/stop worrying 1 1 2 1   Worry too much - different things 0 0 2 1  Trouble relaxing 0 0 2 1  Restless 0 0 2 1  Easily annoyed or irritable 1 1 0 1  Afraid - awful might happen 0 0 2 0  Total GAD 7 Score 3 3 12 6   Anxiety Difficulty   Somewhat difficult Somewhat difficult          Relevant Medications   busPIRone  (BUSPAR ) 5 MG tablet   escitalopram  (LEXAPRO ) 20 MG tablet   hydrOXYzine  (ATARAX ) 25 MG tablet      Other Visit Diagnoses        Claustrophobia       severe phobia with enclosed spaces - flights, MRI, past ativan  use for MRI helped, req for upcoming flight and travel Reviewed PDMP and discussed med use and SE With past flights she became panicked and ill and vomited the whole flight, do feel few doses of ativan , which she has taken and tolerated previously for MRI testing, is safe and appropriate to help her get through a flight, she will have other adults and friends with her, she understands it can cause sedation, confusion   Relevant Medications   LORazepam  (ATIVAN ) 1 MG tablet         Return in about 6 months (around 02/20/2024) for Routine follow-up, Annual Physical.   Michelene Cower, PA-C 08/20/23 9:46 AM

## 2023-08-21 ENCOUNTER — Other Ambulatory Visit: Payer: Self-pay

## 2023-08-29 ENCOUNTER — Other Ambulatory Visit

## 2023-08-29 ENCOUNTER — Ambulatory Visit

## 2023-08-31 ENCOUNTER — Encounter: Payer: Self-pay | Admitting: Family Medicine

## 2023-08-31 NOTE — Assessment & Plan Note (Signed)
 Anxiety sx well controlled with hydroxyzine , lexapro     08/20/2023    9:03 AM 02/20/2023    9:16 AM 01/09/2023   10:02 AM 11/28/2022    1:49 PM  GAD 7 : Generalized Anxiety Score  Nervous, Anxious, on Edge 1 1 2 1   Control/stop worrying 1 1 2 1   Worry too much - different things 0 0 2 1  Trouble relaxing 0 0 2 1  Restless 0 0 2 1  Easily annoyed or irritable 1 1 0 1  Afraid - awful might happen 0 0 2 0  Total GAD 7 Score 3 3 12 6   Anxiety Difficulty   Somewhat difficult Somewhat difficult

## 2023-08-31 NOTE — Assessment & Plan Note (Signed)
 Doing well with current meds and doses    08/20/2023    9:02 AM 02/20/2023    9:16 AM 01/09/2023   10:02 AM  Depression screen PHQ 2/9  Decreased Interest 0 0 1  Down, Depressed, Hopeless 0 0 1  PHQ - 2 Score 0 0 2  Altered sleeping 1 1 1   Tired, decreased energy 1 1 1   Change in appetite 0 0 1  Feeling bad or failure about yourself  0 0 0  Trouble concentrating 0 0 1  Moving slowly or fidgety/restless 0 0 0  Suicidal thoughts 0 0 0  PHQ-9 Score 2 2 6   Difficult doing work/chores   Somewhat difficult

## 2023-08-31 NOTE — Assessment & Plan Note (Signed)
 Sx managed with trazodone , still working well

## 2023-09-12 ENCOUNTER — Other Ambulatory Visit: Payer: Self-pay | Admitting: Family Medicine

## 2023-09-12 DIAGNOSIS — F411 Generalized anxiety disorder: Secondary | ICD-10-CM

## 2023-09-13 NOTE — Telephone Encounter (Signed)
 Duplicate request, L[facility] 08/20/23.  Requested Prescriptions  Pending Prescriptions Disp Refills   hydrOXYzine  (ATARAX ) 25 MG tablet [Pharmacy Med Name: HYDROXYZINE  HCL 25 MG TABLET] 270 tablet 1    Sig: TAKE 1 TABLET (25 MG TOTAL) BY MOUTH EVERY 8 (EIGHT) HOURS AS NEEDED FOR ANXIETY (INSOMNIA).     Ear, Nose, and Throat:  Antihistamines 2 Passed - 09/13/2023 12:24 PM      Passed - Cr in normal range and within 360 days    Creat  Date Value Ref Range Status  11/28/2022 0.98 0.60 - 1.00 mg/dL Final         Passed - Valid encounter within last 12 months    Recent Outpatient Visits           3 weeks ago Current mild episode of major depressive disorder without prior episode [facility])   [facility] Leavy Mole, PA-C       Future Appointments             In 2 months Hester Alm BROCKS, MD [facility]   In 5 months Leavy Mole, PA-C [facility], [facility]

## 2023-09-13 NOTE — Telephone Encounter (Incomplete)
 Too soon for refill.  Requested Prescriptions  Pending Prescriptions Disp Refills   hydrOXYzine  (ATARAX ) 25 MG tablet [Pharmacy Med Name: HYDROXYZINE  HCL 25 MG TABLET] 270 tablet 1    Sig: TAKE 1 TABLET (25 MG TOTAL) BY MOUTH EVERY 8 (EIGHT) HOURS AS NEEDED FOR ANXIETY (INSOMNIA).     Ear, Nose, and Throat:  Antihistamines 2 Passed - 09/13/2023 10:56 AM      Passed - Cr in normal range and within 360 days    Creat  Date Value Ref Range Status  11/28/2022 0.98 0.60 - 1.00 mg/dL Final         Passed - Valid encounter within last 12 months    Recent Outpatient Visits           3 weeks ago Current mild episode of major depressive disorder without prior episode [facility])   [facility] Leavy Mole, PA-C       Future Appointments             In 2 months Hester Alm BROCKS, MD [facility]   In 5 months Leavy Mole, PA-C [facility], [facility]

## 2023-09-26 ENCOUNTER — Other Ambulatory Visit: Payer: Self-pay

## 2023-09-26 ENCOUNTER — Inpatient Hospital Stay: Admitting: Obstetrics and Gynecology

## 2023-09-26 ENCOUNTER — Inpatient Hospital Stay: Attending: Obstetrics and Gynecology

## 2023-09-26 VITALS — BP 132/70 | HR 65 | Temp 97.3°F | Resp 17

## 2023-09-26 DIAGNOSIS — Z8543 Personal history of malignant neoplasm of ovary: Secondary | ICD-10-CM

## 2023-09-26 DIAGNOSIS — Z08 Encounter for follow-up examination after completed treatment for malignant neoplasm: Secondary | ICD-10-CM

## 2023-09-26 DIAGNOSIS — Z90710 Acquired absence of both cervix and uterus: Secondary | ICD-10-CM | POA: Insufficient documentation

## 2023-09-26 DIAGNOSIS — Z90722 Acquired absence of ovaries, bilateral: Secondary | ICD-10-CM | POA: Insufficient documentation

## 2023-09-26 DIAGNOSIS — Z9221 Personal history of antineoplastic chemotherapy: Secondary | ICD-10-CM | POA: Diagnosis not present

## 2023-09-26 DIAGNOSIS — C562 Malignant neoplasm of left ovary: Secondary | ICD-10-CM

## 2023-09-26 DIAGNOSIS — Z9079 Acquired absence of other genital organ(s): Secondary | ICD-10-CM | POA: Insufficient documentation

## 2023-09-26 NOTE — Progress Notes (Signed)
 Gynecologic Oncology Interval Visit   Referring Provider: Dr. 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 female s/p prior hysterectomy (ovaries in situ) who is seen in consultation from Dr. MacDiarmid for ovarian mass. S/p ex-lap, BSO, biopsies, appendectomy, adhesiolysis with Dr. Elby at [facility] on 09/08/21. She is s/p 6 of 6 planned cycles of adjuvant chemotherapy with carboplatin  and paclitaxel , completed 01/30/22. NED since. She returns to clinic for continued surveillance.   Physically she feels well and denies pelvic pain, bleeding and/or pain. She has stopped nexium and bowel movements have normalized. Her neuropathy is managed by Dr. Maree. No interval imaging or hospitalizations.   CA125 05/30/23 7.1  Component Ref Range & Units (hover) 3 mo ago (02/21/23) 6 mo ago (11/15/22) 10 mo ago (07/19/22) 1 yr ago (05/03/22) 1 yr ago (01/30/22) 1 yr ago (01/09/22) 1 yr ago (12/15/21)  Cancer Antigen (CA) 125 6.6 7.0 CM 7.2 CM 7.8 CM 10.6 CM 10.3 CM 11.5 CM   Gynecologic Oncologic History [name] is a pleasant female s/p prior hysterectomy (ovaries in situ) who is seen in consultation from Dr. MacDiarmid for ovarian mass.   Patient presented to Urology for incontinence. She had elevated residual urine and ultrasound was performed which showed 12.2 x 10.1 x 2.9 cm mixed cystic and solid mass suspicious for ovarian mass.   MRI 08/04/21- Reproductive: Large cystic and solid pelvic mass centered in the pelvis. Ovarian tissue or ovary is not demonstrated on the current study. Mass in total measuring 15 x 11 x 12 cm. Soft tissue component showing restricted diffusion and enhancement centered in the lower portion of the mass shows heterogeneous T2 signal and measures 10.8 x 6.8 x 8.5 cm amidst cystic areas. There is a small amount of associated ascites. The mass abuts the vaginal apex following hysterectomy, potentially  involving the LEFT vaginal apex. The soft tissue component displays very heterogeneous enhancement and there are numerous enhancing septations throughout. Collateral pathways from LEFT ovarian vein are noted about the LEFT lateral aspect of the mass. Given that the gonadal vein can be traced to the mass suspicion is for mass of LEFT ovarian origin though this may of parasitized flow from ovarian. Vessels on the LEFT   Other: Trace ascites. No discrete peritoneal nodularity about the pelvis.   Musculoskeletal: No suspicious bone lesion   IMPRESSION: 1. Large cystic and solid pelvic mass highly suspicious for neoplasm. Tumor may arise from the LEFT ovary based on proximity to LEFT ovarian vein though no defined normal ovarian tissue can be seen on either the RIGHT or the LEFT on today's exam. Correlate with surgical history. Sarcoma is also considered based on the marked heterogeneity seen within the lesion on the current study. There is strong restricted diffusion within and avid enhancement of the large central soft tissue component. Gyn consultation gyn consultation is suggested. 2. Trace ascites. 3. Tumor in close proximity to sigmoid colon without definitive signs of involvement also in close proximity to the urinary bladder with defined plane between urinary bladder and mass. Mass may involve the LEFT vaginal fornix. 4. Small amount of associated ascites.  She has history of hysterectomy at age 59 for endometriosis.   We recommended surgical management.  CA 125 was 526 at diagnosis  09/08/2021  Diagnostic laparoscopy, exploratory laparotomy, bilateral salpingo-oophorectomy, infracolic omentectomy, peritoneal biopsies, appendectomy with partial cecectomy (due to frozen section suggesting mucinous ovarian tumor and appendix was  adherent to cecum) adhesiolysis (including enterolysis and ovariolysis), and posterior rectus sheath blocks  with Dr Elby at [facility]. Mass was decompressed with controlled  drainage due to large size and extensive adhesions.  She did well post operatively.   Pathology:  A. 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 Negative for surface involvement. Bilateral fallopian tubes: Negative for carcinoma.   See synoptic report.   Comment: The tumor has an unusual microcystic morphology, but immunohistochemical studies are most consistent with a high-grade serous carcinoma.   B.  Right ovary, oophorectomy: Right ovary with no pathologic diagnosis.  Negative for carcinoma.   C. Omentum, omentectomy: Benign fibroadipose tissue. Negative for carcinoma.   D. Left paracolic gutter, biopsy: Benign fibroadipose tissue and skeletal muscle. Negative for carcinoma.   E. Left pelvic, biopsy: Benign fibroadipose tissue and smooth muscle. Negative for carcinoma.   F. Anterior cul de sac, biopsy: Benign fibroadipose tissue and smooth muscle. Negative for carcinoma.   G. Right pelvic, biopsy: Benign fibrous tissue and smooth muscle. Negative for carcinoma.   H. Posterior cul de sac, biopsy: Benign fibrous tissue. Negative for carcinoma.   I. Appendix and portion of cecum, appendectomy and partial cecectomy: Appendix and portion of cecum with no pathologic diagnosis. Negative for carcinoma.  Pelvic washings - negative for malignancy  8//31/2023-01/30/2022 : Carboplatin  (AUC 6) + Paclitaxel  (175) q21d X 6 Cycles  04/26/2022 CT C/A/P IMPRESSION: 1. No definitive evidence of metastatic disease. 2. Minimal focal vague haziness in the ventral left lower quadrant, status post omentectomy. No discrete nodules. Recommend attention on follow-up. 3.  Aortic atherosclerosis (ICD10-I70.0). 4.  Emphysema (ICD10-J43.9).  Genetics Assessment: Negative genetic testing. No pathogenic variants identified on the [facility] CancerNext-Expanded+RNA panel. The  report date is 10/31/2021. HRD testing through [facility] was also negative and reported out 10/31/2021.   Problem List: Patient Active Problem List   Diagnosis Date Noted   Fatigue 11/28/2022   Genetic testing 11/02/2021   Ovarian cancer on left (HCC) 09/28/2021   Depression, unspecified 09/06/2021   Mild intermittent asthma 09/06/2021   Ovarian mass 08/10/2021   PSVT (paroxysmal supraventricular tachycardia) (HCC) 08/14/2019   Vitamin D  deficiency 06/10/2019   Current mild episode of major depressive disorder without prior episode (HCC) 10/15/2018   GAD (generalized anxiety disorder) 10/15/2018   History of colonic polyps    Obesity (BMI 30.0-34.9) 05/29/2018   GERD (gastroesophageal reflux disease) 11/27/2017   Osteopenia 11/27/2017   Mixed hyperlipidemia 12/09/2014   Insomnia 12/09/2014   Past Medical History: Past Medical History:  Diagnosis Date   Allergy    Arthritis    Asthma    Atypical chest pain 03/14/2019   Atypical mole 06/12/2018   right prox lat thigh/mod   Atypical mole 05/19/2014   left sup med buttock/excision   Atypical mole 03/26/2014   right sup lat buttock   Atypical mole 03/26/2013   left lat mid back   Atypical mole 11/08/2006   right shoulder superior/excision   Basal cell carcinoma 06/12/2016   right medial sup canthus below eyebrow   Basal cell carcinoma 09/23/2014   left nasal bridge   Basal cell carcinoma 03/26/2013   left nasal sidewall   Basal cell carcinoma 08/27/2012   left nasal dorsum   Cancer (HCC)    shoulder, nose skin CA- basal cell CA   GERD (gastroesophageal reflux disease)    occasional   Hyperlipidemia  Osteopenia    Polyp of transverse colon    Right arm weakness 03/14/2019   Stress due to illness of family member 05/29/2018   Past Surgical History: Past Surgical History:  Procedure Laterality Date   BILATERAL SALPINGO-OOPHORECTOMY AND OMENTECTOMY; WITH RADICAL DISSECTION FOR DEBULKING  09/08/2021   BREAST  BIOPSY Right    core bx- neg   COLONOSCOPY     COLONOSCOPY WITH PROPOFOL  N/A 10/08/2018   Procedure: COLONOSCOPY WITH PROPOFOL ;  Surgeon: Jinny Carmine, MD;  Location: [facility] ENDOSCOPY;  Service: Endoscopy;  Laterality: N/A;   MOHS SURGERY     TONSILLECTOMY     VAGINAL HYSTERECTOMY     Past Gynecologic History:  Menarche: age 34 Hysterectomy at age 8.   OB History:  OB History  No obstetric history on file.   Family History: Family History  Problem Relation Age of Onset   Lung cancer Mother    Diabetes Father    Parkinson's disease Father    Alzheimer's disease Father    Heart attack Father 87   Alcohol abuse Sister    Heart disease Sister    Lung cancer Sister 27   Down syndrome Sister    Colon cancer Paternal Uncle        d. 48s   Melanoma Maternal Grandmother    Stroke Paternal Grandmother    Lung cancer Paternal Grandmother        d. 77s   Stomach cancer Paternal Grandfather        d. 75s   Liver cancer Son 37   Esophageal cancer Neg Hx    Breast cancer Neg Hx    Social History: Social History   Socioeconomic History   Marital status: Married    Spouse name: [name]   Number of children: 2   Years of education: Not on file   Highest education level: Bachelor's degree (e.g., BA, AB, BS)  Occupational History   Occupation: Retired  Tobacco Use   Smoking status: Former    Current packs/day: 0.00    Average packs/day: 1.5 packs/day for 20.0 years (30.0 ttl pk-yrs)    Types: Cigarettes    Start date: 71    Quit date: 1993    Years since quitting: 32.6    Passive exposure: Past   Smokeless tobacco: Never  Vaping Use   Vaping status: Never Used  Substance and Sexual Activity   Alcohol use: Yes    Alcohol/week: 1.0 standard drink of alcohol    Types: 1 Glasses of wine per week    Comment: one glass of wine once a month   Drug use: Never   Sexual activity: Not Currently  Other Topics Concern   Not on file  Social History Narrative   Lives with husband  in [place]; quit smoking > 30 years; once every 1-2 months; retd. Teacher- elementary school;    Social Drivers of Health   Financial Resource Strain: Low Risk  (05/09/2022)   Overall Financial Resource Strain (CARDIA)    Difficulty of Paying Living Expenses: Not hard at all  Food Insecurity: No Food Insecurity (05/09/2022)   Hunger Vital Sign    Worried About Running Out of Food in the Last Year: Never true    Ran Out of Food in the Last Year: Never true  Transportation Needs: No Transportation Needs (05/09/2022)   PRAPARE - Administrator, Civil Service (Medical): No    Lack of Transportation (Non-Medical): No  Physical Activity: Insufficiently Active (05/09/2022)  Exercise Vital Sign    Days of Exercise per Week: 3 days    Minutes of Exercise per Session: 20 min  Stress: Stress Concern Present (05/09/2022)   [employer] of Occupational Health - Occupational Stress Questionnaire    Feeling of Stress : To some extent  Social Connections: Socially Integrated (05/09/2022)   Social Connection and Isolation Panel    Frequency of Communication with Friends and Family: Three times a week    Frequency of Social Gatherings with Friends and Family: Three times a week    Attends Religious Services: More than 4 times per year    Active Member of Clubs or Organizations: Yes    Attends Banker Meetings: More than 4 times per year    Marital Status: Married  Catering manager Violence: Not At Risk (03/31/2022)   Humiliation, Afraid, Rape, and Kick questionnaire    Fear of Current or Ex-Partner: No    Emotionally Abused: No    Physically Abused: No    Sexually Abused: No   Allergies: Allergies  Allergen Reactions   Tetanus Toxoids Shortness Of Breath   Sulfa Antibiotics     Severe vomiting   Current Medications: Current Outpatient Medications  Medication Sig Dispense Refill   acetaminophen (TYLENOL) 325 MG tablet Take 650 mg by mouth every 6 (six) hours as  needed.     Alpha Lipoic Acid 200 MG CAPS Take by mouth.     busPIRone  (BUSPAR ) 5 MG tablet Take one tablet in the AM and then you can take a second as needed during the day 180 tablet 1   escitalopram  (LEXAPRO ) 20 MG tablet Take 1 tablet (20 mg total) by mouth daily. 90 tablet 1   famotidine  (PEPCID ) 10 MG tablet Take 1 tablet (10 mg total) by mouth 2 (two) times daily as needed for heartburn or indigestion.     hydrOXYzine  (ATARAX ) 25 MG tablet Take 1 tablet (25 mg total) by mouth every 8 (eight) hours as needed for anxiety (insomnia). 90 tablet 1   LORazepam  (ATIVAN ) 1 MG tablet Take 0.5-1 tablets (0.5-1 mg total) by mouth daily as needed for anxiety (for severe anxiety/clausterphobia events and/or flying). 5 tablet 0   lovastatin  (MEVACOR ) 20 MG tablet TAKE 1 TABLET BY MOUTH EVERYDAY AT BEDTIME 90 tablet 1   melatonin 3 MG TABS tablet Take 3 mg by mouth at bedtime.     metoprolol  succinate (TOPROL -XL) 25 MG 24 hr tablet TAKE 1 TABLET (25 MG TOTAL) BY MOUTH AS NEEDED (FOR PALPITATIONS). 90 tablet 3   vitamin B-12 (CYANOCOBALAMIN ) 1000 MCG tablet Take 1,000 mcg by mouth daily.     VITAMIN D , CHOLECALCIFEROL, PO Take 500 mg by mouth daily.     Ascorbic Acid (VITAMIN C) 1000 MG tablet Take 1,000 mg by mouth daily. (Patient not taking: Reported on 09/26/2023)     meloxicam (MOBIC) 7.5 MG tablet Take 7.5 mg by mouth as needed. (Patient not taking: Reported on 09/26/2023)     traZODone  (DESYREL ) 50 MG tablet TAKE 1/2 TABLET TO 2 TABLET BY MOUTH AT BEDTIME AS NEEDED FOR SLEEP (Patient not taking: Reported on 09/26/2023) 180 tablet 1   No current facility-administered medications for this visit.    Review of Systems General:  fatigue & stress Skin: no complaints Eyes: no complaints HEENT: no complaints Breasts: no complaints Pulmonary: shortness of breath- stable Cardiac: no complaints Gastrointestinal: no complaints Genitourinary/Sexual: no complaints Ob/Gyn: no complaints Musculoskeletal: no  complaints Hematology: no complaints Neurologic/Psych: no complaints  Objective:  Physical Examination:  BP 132/70   Pulse 65   Temp (!) 97.3 F (36.3 C)   Resp 17   LMP  (LMP Unknown)   SpO2 96%     ECOG Performance Status: 0 - Asymptomatic  GENERAL: Patient is a well appearing female in no acute distress HEENT:  Sclera clear. Anicteric NODES:  Negative axillary, supraclavicular, inguinal lymph node survery LUNGS:  Clear to auscultation bilaterally.   HEART:  Regular rate and rhythm.  ABDOMEN:  Soft, nontender.  No hernias, incisions well healed. No masses or ascites EXTREMITIES:  No peripheral edema. Atraumatic. No cyanosis SKIN:  Clear with no obvious rashes or skin changes.  NEURO:  Nonfocal. Well oriented.  Appropriate affect.  Pelvic: Exam chaperoned by CMA EGBUS: no lesions.  Vagina: no bleeding, discharge, or lesions.   Cervix: surgically absent Uterus: surgically absent BME: no palpable masses Rectovaginal: deferred   Lab Review:  CA 125 per HPI. Today's result pending.   Radiologic Imaging: No imaging on site.    Assessment:  [name] is a 78 y.o. female with complex 15 cm pelvic mass found to have stage Ic high grade serous cancer of the ovary.  In 7/23 had diagnostic laparoscopy, ex lap, BSO, infracolic omentectomy, peritoneal biopsies, LOA, appendectomy with partial cecectomy (due to frozen section suggesting mucinous ovarian tumor and appendix was adherent to cecum). She had prior hysterectomy. Pathology consistent with stage IC high grade serous ovarian cancer of left ovary based on controlled drainage of the mass.  She received 6 cycles of adjuvant carboplatin -paclitaxel . CA 125 was 526 at diagnosis. CT 04/2022 no definitive recurrence. CA 125 was down to 7 in October 2024. Asymptomatic. CA 125 pending. Clinically, NED today.   Germline panel testing and tumor HRD testing negative.   Medical co-morbidities complicating care: prior vaginal  hysterectomy; asthma, and prior skin cancers Plan:   Problem List Items Addressed This Visit       Endocrine   Ovarian cancer on left [facility]) - Primary   Return to clinic in 3 months for continued surveillance with pelvic exam and labs to monitor CA125. We again reviewed NCCN surveillance guidelines including exams every 3 months for first two to three years then every 6 months through year 5 then annually thereafter. We again reviewed symptoms that would be concerning for recurrent disease.  She will follow up with her primary care doctor for ongoing wellness, management of other medical comorbidities, and cancer screenings.   The patient's diagnosis, an outline of the further diagnostic and laboratory studies which will be required, the recommendation for surgery, and alternatives were discussed with her and her accompanying family members.  All questions were answered to their satisfaction.  Prentice Agent, MD

## 2023-09-27 ENCOUNTER — Ambulatory Visit: Payer: Self-pay | Admitting: Nurse Practitioner

## 2023-09-27 LAB — CA 125: Cancer Antigen (CA) 125: 8.2 U/mL (ref 0.0–38.1)

## 2023-10-09 DIAGNOSIS — Z85828 Personal history of other malignant neoplasm of skin: Secondary | ICD-10-CM | POA: Insufficient documentation

## 2023-10-09 DIAGNOSIS — L814 Other melanin hyperpigmentation: Secondary | ICD-10-CM | POA: Diagnosis not present

## 2023-10-09 DIAGNOSIS — L821 Other seborrheic keratosis: Secondary | ICD-10-CM | POA: Diagnosis not present

## 2023-10-09 DIAGNOSIS — L570 Actinic keratosis: Secondary | ICD-10-CM | POA: Diagnosis not present

## 2023-10-09 DIAGNOSIS — D229 Melanocytic nevi, unspecified: Secondary | ICD-10-CM | POA: Diagnosis not present

## 2023-10-11 ENCOUNTER — Encounter: Payer: Self-pay | Admitting: Family Medicine

## 2023-10-18 ENCOUNTER — Ambulatory Visit: Admitting: Nurse Practitioner

## 2023-11-05 ENCOUNTER — Encounter: Payer: Self-pay | Admitting: Family Medicine

## 2023-11-05 ENCOUNTER — Other Ambulatory Visit: Payer: Self-pay | Admitting: Physician Assistant

## 2023-11-05 ENCOUNTER — Ambulatory Visit (INDEPENDENT_AMBULATORY_CARE_PROVIDER_SITE_OTHER): Admitting: Family Medicine

## 2023-11-05 ENCOUNTER — Ambulatory Visit: Attending: Family Medicine

## 2023-11-05 ENCOUNTER — Other Ambulatory Visit: Payer: Self-pay | Admitting: Family Medicine

## 2023-11-05 VITALS — BP 118/68 | HR 67 | Resp 18 | Ht 62.0 in

## 2023-11-05 DIAGNOSIS — R5383 Other fatigue: Secondary | ICD-10-CM

## 2023-11-05 DIAGNOSIS — I471 Supraventricular tachycardia, unspecified: Secondary | ICD-10-CM

## 2023-11-05 DIAGNOSIS — R0789 Other chest pain: Secondary | ICD-10-CM

## 2023-11-05 DIAGNOSIS — F411 Generalized anxiety disorder: Secondary | ICD-10-CM

## 2023-11-05 DIAGNOSIS — Z923 Personal history of irradiation: Secondary | ICD-10-CM | POA: Diagnosis not present

## 2023-11-05 DIAGNOSIS — R002 Palpitations: Secondary | ICD-10-CM | POA: Diagnosis not present

## 2023-11-05 DIAGNOSIS — Z8709 Personal history of other diseases of the respiratory system: Secondary | ICD-10-CM

## 2023-11-05 DIAGNOSIS — E782 Mixed hyperlipidemia: Secondary | ICD-10-CM

## 2023-11-05 DIAGNOSIS — Z9221 Personal history of antineoplastic chemotherapy: Secondary | ICD-10-CM | POA: Diagnosis not present

## 2023-11-05 DIAGNOSIS — Z23 Encounter for immunization: Secondary | ICD-10-CM

## 2023-11-05 DIAGNOSIS — R0602 Shortness of breath: Secondary | ICD-10-CM | POA: Diagnosis not present

## 2023-11-05 DIAGNOSIS — R0609 Other forms of dyspnea: Secondary | ICD-10-CM | POA: Diagnosis not present

## 2023-11-05 MED ORDER — COVID-19 MRNA VAC-TRIS(PFIZER) 30 MCG/0.3ML IM SUSY: 0.3000 mL | PREFILLED_SYRINGE | Freq: Once | INTRAMUSCULAR | 0 refills | Status: AC

## 2023-11-05 MED ORDER — ALBUTEROL SULFATE HFA 108 (90 BASE) MCG/ACT IN AERS: 2.0000 | INHALATION_SPRAY | Freq: Four times a day (QID) | RESPIRATORY_TRACT | 0 refills | Status: AC | PRN

## 2023-11-05 NOTE — Progress Notes (Unsigned)
 Patient ID: [name], female    DOB: Sep 25, 1945, 78 y.o.   MRN: [id number]  PCP: Leavy Mole, PA-C  Chief Complaint  Patient presents with   Shortness of Breath    W/exertion but fine when sitting x6 months. Does have Cardiologist who says EKG's are normal.    Subjective:   [name] is a 78 y.o. female, presents to clinic with CC of the following:  Shortness of Breath Pertinent negatives include no chest pain, leg swelling or wheezing.    SOB, pounding heart, DOE all gradual worsening x 6 months  Hx of ovarian ca, oophrectomy and chemo: 8//31/2023-01/30/2022 : Carboplatin  (AUC 6) + Paclitaxel  (175) q21d X 6 Cycles  Asthma she has used OTC epi inhaler   She says she's gained 5 lbs despite trying to eat less and stay active, per weights no sig weight difference in 6 months  Wt Readings from Last 5 Encounters:  08/20/23 183 lb (83 kg)  02/21/23 183 lb 4.8 oz (83.1 kg)  01/16/23 181 lb 9.6 oz (82.4 kg)  01/09/23 179 lb 12.8 oz (81.6 kg)  11/28/22 180 lb 3.2 oz (81.7 kg)   BMI Readings from Last 5 Encounters:  11/05/23 33.47 kg/m  08/20/23 33.47 kg/m  02/21/23 33.53 kg/m  02/20/23 33.22 kg/m  01/16/23 33.22 kg/m   Asthma/EIB - she says she uses OTC inahler with epinephrine for asthma/EIB? No recent inhalers prescribed, she used to need breathing tx when she was young, no recent lung testing/spirometry, exacerbations requiring steroids/ER/inpt and no pneumnia or other infections  Discussed the use of AI scribe software for clinical note transcription with the patient, who gave verbal consent to proceed.  History of Present Illness [name] is a 78 year old female who presents with shortness of breath and palpitations.  Dyspnea and palpitations - Shortness of breath and palpitations with exertion, such as walking or carrying items - Palpitations described as heart going 'thumpity, thumpity, thump' and a sensation of being in a vacuum - Symptoms  resolve after resting for 10-15 minutes - No chest pain radiating to back, neck, or arms - No swelling in legs, orthopnea, or paroxysmal nocturnal dyspnea - No unusual beats or sensations in chest with position changes  Asthma and respiratory symptoms - History of asthma - Carries and occasionally uses an over-the-counter inhaler for exercise-induced symptoms or allergen exposure (e.g., dry leaves) - No recent use of prescription inhalers such as albuterol   Weight changes and activity level - Weight gain of approximately five pounds since last visit, current weight approximately 178 pounds - Remains active, walking 1-2 miles daily and playing with her dog - Difficulty losing weight despite reducing intake of sweets  Oncologic and cardiac history - History of chemotherapy with six cycles of carboplatin  and paclitaxel  - Chemical stress test in 2023 showed no significant changes from previous test in 2021  Reviewed her cardiac testing and hx extensively today Last ECHO 01/2023, EKG 01/2023, stress tests   Patient Active Problem List   Diagnosis Date Noted   History of nonmelanoma skin cancer 10/09/2023   Fatigue 11/28/2022   Genetic testing 11/02/2021   Ovarian cancer on left (HCC) 09/28/2021   Depression, unspecified 09/06/2021   Mild intermittent asthma 09/06/2021   Ovarian mass 08/10/2021   PSVT (paroxysmal supraventricular tachycardia) (HCC) 08/14/2019   Vitamin D  deficiency 06/10/2019   Current mild episode of major depressive disorder without prior episode (HCC) 10/15/2018   GAD (generalized anxiety disorder) 10/15/2018  History of colonic polyps    Obesity (BMI 30.0-34.9) 05/29/2018   GERD (gastroesophageal reflux disease) 11/27/2017   Osteopenia 11/27/2017   Mixed hyperlipidemia 12/09/2014   Insomnia 12/09/2014      Current Outpatient Medications:    acetaminophen (TYLENOL) 325 MG tablet, Take 650 mg by mouth every 6 (six) hours as needed., Disp: , Rfl:    Alpha  Lipoic Acid 200 MG CAPS, Take by mouth., Disp: , Rfl:    busPIRone  (BUSPAR ) 5 MG tablet, Take one tablet in the AM and then you can take a second as needed during the day, Disp: 180 tablet, Rfl: 1   escitalopram  (LEXAPRO ) 20 MG tablet, Take 1 tablet (20 mg total) by mouth daily., Disp: 90 tablet, Rfl: 1   famotidine  (PEPCID ) 10 MG tablet, Take 1 tablet (10 mg total) by mouth 2 (two) times daily as needed for heartburn or indigestion., Disp: , Rfl:    hydrOXYzine  (ATARAX ) 25 MG tablet, Take 1 tablet (25 mg total) by mouth every 8 (eight) hours as needed for anxiety (insomnia)., Disp: 90 tablet, Rfl: 1   LORazepam  (ATIVAN ) 1 MG tablet, Take 0.5-1 tablets (0.5-1 mg total) by mouth daily as needed for anxiety (for severe anxiety/clausterphobia events and/or flying)., Disp: 5 tablet, Rfl: 0   lovastatin  (MEVACOR ) 20 MG tablet, TAKE 1 TABLET BY MOUTH EVERYDAY AT BEDTIME, Disp: 90 tablet, Rfl: 1   melatonin 3 MG TABS tablet, Take 3 mg by mouth at bedtime., Disp: , Rfl:    metoprolol  succinate (TOPROL -XL) 25 MG 24 hr tablet, TAKE 1 TABLET (25 MG TOTAL) BY MOUTH AS NEEDED (FOR PALPITATIONS)., Disp: 90 tablet, Rfl: 3   vitamin B-12 (CYANOCOBALAMIN ) 1000 MCG tablet, Take 1,000 mcg by mouth daily., Disp: , Rfl:    VITAMIN D , CHOLECALCIFEROL, PO, Take 500 mg by mouth daily., Disp: , Rfl:    Ascorbic Acid (VITAMIN C) 1000 MG tablet, Take 1,000 mg by mouth daily. (Patient not taking: Reported on 11/05/2023), Disp: , Rfl:    meloxicam (MOBIC) 7.5 MG tablet, Take 7.5 mg by mouth as needed. (Patient not taking: Reported on 11/05/2023), Disp: , Rfl:    traZODone  (DESYREL ) 50 MG tablet, TAKE 1/2 TABLET TO 2 TABLET BY MOUTH AT BEDTIME AS NEEDED FOR SLEEP (Patient not taking: Reported on 11/05/2023), Disp: 180 tablet, Rfl: 1   Allergies  Allergen Reactions   Tetanus Toxoid-Containing Vaccines Shortness Of Breath   Sulfa Antibiotics     Severe vomiting     Social History   Tobacco Use   Smoking status: Former     Current packs/day: 0.00    Average packs/day: 1.5 packs/day for 20.0 years (30.0 ttl pk-yrs)    Types: Cigarettes    Start date: 39    Quit date: 16    Years since quitting: 32.7    Passive exposure: Past   Smokeless tobacco: Never  Vaping Use   Vaping status: Never Used  Substance Use Topics   Alcohol use: Yes    Alcohol/week: 1.0 standard drink of alcohol    Types: 1 Glasses of wine per week    Comment: one glass of wine once a month   Drug use: Never      Chart Review Today: I personally reviewed active problem list, medication list, allergies, family history, social history, health maintenance, notes from last encounter, lab results, imaging with the patient/caregiver today.   Review of Systems  Constitutional: Negative.   HENT: Negative.    Eyes: Negative.   Respiratory:  Positive for chest tightness and shortness of breath. Negative for apnea, cough, choking, wheezing and stridor.   Cardiovascular:  Positive for palpitations. Negative for chest pain and leg swelling.  Gastrointestinal: Negative.   Endocrine: Negative.   Genitourinary: Negative.   Musculoskeletal: Negative.   Skin: Negative.   Allergic/Immunologic: Negative.   Neurological: Negative.   Hematological: Negative.   Psychiatric/Behavioral: Negative.  Negative for sleep disturbance.   All other systems reviewed and are negative.      Objective:   Vitals:   11/05/23 0934  BP: 118/68  Pulse: 67  Resp: 18  SpO2: 95%  Height: 5' 2 (1.575 m)    Body mass index is 33.47 kg/m.  Physical Exam Vitals and nursing note reviewed.  Constitutional:      General: She is not in acute distress.    Appearance: Normal appearance. She is well-developed and well-groomed. She is obese. She is not ill-appearing, toxic-appearing or diaphoretic.  HENT:     Head: Normocephalic and atraumatic.     Right Ear: External ear normal.     Left Ear: External ear normal.     Nose: Nose normal.  Eyes:     General:  No scleral icterus.       Right eye: No discharge.        Left eye: No discharge.     Conjunctiva/sclera: Conjunctivae normal.  Neck:     Trachea: No tracheal deviation.  Cardiovascular:     Rate and Rhythm: Normal rate and regular rhythm.     Pulses: Normal pulses.     Heart sounds: Normal heart sounds. No murmur heard.    No friction rub. No gallop.  Pulmonary:     Effort: Pulmonary effort is normal. No respiratory distress.     Breath sounds: Normal breath sounds. No stridor. No wheezing, rhonchi or rales.  Musculoskeletal:     Right lower leg: No edema.     Left lower leg: No edema.  Skin:    General: Skin is warm and dry.     Findings: No rash.  Neurological:     Mental Status: She is alert.     Motor: No abnormal muscle tone.     Coordination: Coordination normal.     Gait: Gait normal.  Psychiatric:        Mood and Affect: Mood normal.        Behavior: Behavior normal. Behavior is cooperative.     ECG interpretation   Date: 11/05/23  Rate: 63  Rhythm: junctional rhythm  QRS Axis: normal  Intervals: PR?, QRS normal, Qtc normal  ST/T Wave abnormalities: TWI in II, III, aVF, V3-V6  Conduction Disutrbances: none  Old EKG Reviewed: 01/16/2023 - new junctional (though p waves very hard to discern on 01/2023 ECG, new TWI in all leads on todays ECG with is different from prior, otherwise they appear very similar wave forms, R qwave progression, low amplitude of P and T waves)  Narrative Interpretation:  junctional rhythm, nonspecific T wave abnormality, abnormal ECG     Results for orders placed or performed in visit on 09/26/23  CA 125   Collection Time: 09/26/23  9:07 AM  Result Value Ref Range   Cancer Antigen (CA) 125 8.2 0.0 - 38.1 U/mL       Assessment & Plan:    Assessment & Plan  1. SOB (shortness of breath) on exertion (Primary) Shortness of breath Intermittent shortness of breath with a sensation of chest pressure and palpitations, resolving with  rest in 5-10 minutes. Differential includes stable angina, pulmonary issues, or asthma exacerbation. Previous cardiac evaluations, including EKG and stress tests, show no acute ischemia or heart failure. Consideration of chemotherapy-related cardiac effects. - consider echocardiogram to assess heart function and rule out structural issues such as aortic stenosis. - Order chest x-ray to evaluate lungs and heart - she can complete today, lungs CTA A&P  - Order Holter monitor to assess for intermittent arrhythmias. - Order basic labs including electrolytes and thyroid  function tests. - Send note to cardiologist Dr. Mady for further evaluation and testing. - Advise to call an ambulance if symptoms do not resolve with rest in 5-10 minutes with CP, SOB or feeling near syncope, diaphoresis, severe n/v - EKG 12-Lead - CBC with Differential/Platelet - Comprehensive metabolic panel with GFR - TSH - Brain natriuretic peptide - Magnesium - albuterol  (VENTOLIN  HFA) 108 (90 Base) MCG/ACT inhaler; Inhale 2 puffs into the lungs every 6 (six) hours as needed for wheezing or shortness of breath.  Dispense: 8 g; Refill: 0 - DG Chest 2 View - LONG TERM MONITOR (3-14 DAYS)  2. Fatigue, unspecified type She's had some fatigue for the past few years, its starting to slow her down when she wants to be busy, again recheck and r/o anemia, hypothyroid, electrolyte abnormality etc - CBC with Differential/Platelet - Comprehensive metabolic panel with GFR - TSH - Brain natriuretic peptide - Magnesium - DG Chest 2 View  3. Palpitation Forceful beating/palpitations vague discomfort with exertion, used to be able to walk miles, now shorter distance and she develops sx - CBC with Differential/Platelet - Comprehensive metabolic panel with GFR - TSH - Brain natriuretic peptide - Magnesium - DG Chest 2 View - LONG TERM MONITOR (3-14 DAYS)  4. DOE (dyspnea on exertion) Less exercise tolerance with being just as active  and no sig weight changes - CBC with Differential/Platelet - Comprehensive metabolic panel with GFR - TSH - Brain natriuretic peptide - Magnesium - DG Chest 2 View - LONG TERM MONITOR (3-14 DAYS)  5. Immunization due To do at phrmacy - COVID-19 mRNA vaccine, Pfizer, (COMIRNATY) syringe; Inject 0.3 mLs into the muscle once for 1 dose.  Dispense: 0.3 mL; Refill: 0  6. Status post chemoradiation Status post chemotherapy for cancer History of chemotherapy for ovarian cancer with consideration of potential long-term cardiac effects. Previous cardiac monitoring post-chemotherapy showed no significant changes. - Review previous oncology notes for chemotherapy details. - Discuss potential long-term effects of chemotherapy on cardiac function with oncologist if needed. - CBC with Differential/Platelet - Comprehensive metabolic panel with GFR - TSH - Brain natriuretic peptide - Magnesium  7. Chest tightness Trial inhaler, get CXR, lungs CTA on exam - CBC with Differential/Platelet - Comprehensive metabolic panel with GFR - TSH - Brain natriuretic peptide - Magnesium - albuterol  (VENTOLIN  HFA) 108 (90 Base) MCG/ACT inhaler; Inhale 2 puffs into the lungs every 6 (six) hours as needed for wheezing or shortness of breath.  Dispense: 8 g; Refill: 0 - DG Chest 2 View - LONG TERM MONITOR (3-14 DAYS)  8. History of asthma Asthma with possible exercise-induced bronchospasm. Current symptoms may relate to asthma? Previous use of non-standard over-the-counter inhaler with epinephrine. Discussed albuterol  inhaler use and potential side effects such as increased heart rate. - Prescribe albuterol  inhaler for use as needed before exercise or if symptoms occur. - Educate on proper use of albuterol  inhaler and potential side effects such as increased heart rate. - unfortunately no spirometry in office - albuterol  (  VENTOLIN  HFA) 108 (90 Base) MCG/ACT inhaler; Inhale 2 puffs into the lungs every 6 (six)  hours as needed for wheezing or shortness of breath.  Dispense: 8 g; Refill: 0  9. PSVT (paroxysmal supraventricular tachycardia) Has been controlled for years - she notes pounding forceful and uncomfortable sx, can do holter monitor or f/up w cardiology - LONG TERM MONITOR (3-14 DAYS)       General Health Maintenance Discussion of vaccinations including flu, COVID-19, and RSV. Recent flu and RSV vaccinations completed. COVID-19 vaccination recommended due to high-risk status and current strain severity. - Send prescription for COVID-19 vaccine to pharmacy. - Advise to receive COVID-19 vaccine at local pharmacy or community [facility] pharmacy.  Follow-Up Follow-up with cardiologist Dr. Mady recommended for further evaluation of cardiac symptoms. - Advise to schedule appointment with Dr. Mady as soon as possible. - Follow up on lab results and imaging findings.  Recording duration: 43 minutes       Michelene Cower, PA-C 11/05/23 10:12 AM

## 2023-11-05 NOTE — Patient Instructions (Signed)
 Follow up with Dr. Mady for further eval of your symptoms

## 2023-11-06 ENCOUNTER — Ambulatory Visit: Payer: Self-pay | Admitting: Family Medicine

## 2023-11-06 ENCOUNTER — Telehealth: Payer: Self-pay

## 2023-11-06 ENCOUNTER — Encounter: Payer: Self-pay | Admitting: Family Medicine

## 2023-11-06 ENCOUNTER — Encounter: Payer: Self-pay | Admitting: Internal Medicine

## 2023-11-06 DIAGNOSIS — E782 Mixed hyperlipidemia: Secondary | ICD-10-CM

## 2023-11-06 LAB — CBC WITH DIFFERENTIAL/PLATELET
Absolute Lymphocytes: 9810 {cells}/uL — ABNORMAL HIGH (ref 850–3900)
Absolute Monocytes: 462 {cells}/uL (ref 200–950)
Basophils Absolute: 77 {cells}/uL (ref 0–200)
Basophils Relative: 0.5 %
Eosinophils Absolute: 370 {cells}/uL (ref 15–500)
Eosinophils Relative: 2.4 %
HCT: 42.3 % (ref 35.0–45.0)
Hemoglobin: 13.8 g/dL (ref 11.7–15.5)
MCH: 31.9 pg (ref 27.0–33.0)
MCHC: 32.6 g/dL (ref 32.0–36.0)
MCV: 97.7 fL (ref 80.0–100.0)
MPV: 10.8 fL (ref 7.5–12.5)
Monocytes Relative: 3 %
Neutro Abs: 4682 {cells}/uL (ref 1500–7800)
Neutrophils Relative %: 30.4 %
Platelets: 167 Thousand/uL (ref 140–400)
RBC: 4.33 Million/uL (ref 3.80–5.10)
RDW: 13.1 % (ref 11.0–15.0)
Total Lymphocyte: 63.7 %
WBC: 15.4 Thousand/uL — ABNORMAL HIGH (ref 3.8–10.8)

## 2023-11-06 LAB — COMPREHENSIVE METABOLIC PANEL WITH GFR
AG Ratio: 2.2 (calc) (ref 1.0–2.5)
ALT: 13 U/L (ref 6–29)
AST: 18 U/L (ref 10–35)
Albumin: 4.2 g/dL (ref 3.6–5.1)
Alkaline phosphatase (APISO): 60 U/L (ref 37–153)
BUN: 15 mg/dL (ref 7–25)
CO2: 31 mmol/L (ref 20–32)
Calcium: 8.8 mg/dL (ref 8.6–10.4)
Chloride: 105 mmol/L (ref 98–110)
Creat: 0.99 mg/dL (ref 0.60–1.00)
Globulin: 1.9 g/dL (ref 1.9–3.7)
Glucose, Bld: 84 mg/dL (ref 65–99)
Potassium: 4.4 mmol/L (ref 3.5–5.3)
Sodium: 141 mmol/L (ref 135–146)
Total Bilirubin: 0.3 mg/dL (ref 0.2–1.2)
Total Protein: 6.1 g/dL (ref 6.1–8.1)
eGFR: 58 mL/min/1.73m2 — ABNORMAL LOW (ref >=60)

## 2023-11-06 LAB — MAGNESIUM: Magnesium: 2.1 mg/dL (ref 1.5–2.5)

## 2023-11-06 LAB — BRAIN NATRIURETIC PEPTIDE: Brain Natriuretic Peptide: 47 pg/mL (ref <100)

## 2023-11-06 LAB — TSH: TSH: 0.54 m[IU]/L (ref 0.40–4.50)

## 2023-11-06 MED ORDER — LOVASTATIN 20 MG PO TABS: ORAL_TABLET | ORAL | 1 refills | Status: DC

## 2023-11-06 NOTE — Telephone Encounter (Signed)
 Requested medications are due for refill today.  yes  Requested medications are on the active medications list.  yes  Last refill. 06/01/2023 #90 1 rf  Future visit scheduled.   yes  Notes to clinic.  Labs are expired.    Requested Prescriptions  Pending Prescriptions Disp Refills   lovastatin  (MEVACOR ) 20 MG tablet [Pharmacy Med Name: LOVASTATIN  20 MG TABLET] 90 tablet 1    Sig: TAKE 1 TABLET BY MOUTH EVERYDAY AT BEDTIME     Cardiovascular:  Antilipid - Statins 2 Failed - 11/06/2023  3:14 PM      Failed - Lipid Panel in normal range within the last 12 months    Cholesterol, Total  Date Value Ref Range Status  07/07/2015 158 100 - 199 mg/dL Final   Cholesterol  Date Value Ref Range Status  05/10/2022 182 <200 mg/dL Final   LDL Cholesterol (Calc)  Date Value Ref Range Status  05/10/2022 86 mg/dL (calc) Final    Comment:    Reference range: <100 . Desirable range <100 mg/dL for primary prevention;   <70 mg/dL for patients with CHD or diabetic patients  with > or = 2 CHD risk factors. [facility] LDL-C is now calculated using the Martin-Hopkins  calculation, which is a validated novel method providing  better accuracy than the Friedewald equation in the  estimation of LDL-C.  Gladis APPLETHWAITE et al. SANDREA. 7986;689(80): 2061-2068  (http://education.[facility].com/faq/FAQ164)    HDL  Date Value Ref Range Status  05/10/2022 48 (L) > OR = 50 mg/dL Final  94/75/7982 53 >60 mg/dL Final   Triglycerides  Date Value Ref Range Status  05/10/2022 358 (H) <150 mg/dL Final    Comment:    . If a non-fasting specimen was collected, consider repeat triglyceride testing on a fasting specimen if clinically indicated.  Veatrice et al. J. of Clin. Lipidol. 2015;9:129-169. [facility]          Passed - Cr in normal range and within 360 days    Creat  Date Value Ref Range Status  11/05/2023 0.99 0.60 - 1.00 mg/dL Final         Passed - Patient is not pregnant      Passed - Valid encounter  within last 12 months    Recent Outpatient Visits           Yesterday SOB (shortness of breath) on exertion   [facility] Leavy Mole, PA-C   2 months ago Current mild episode of major depressive disorder without prior episode   [facility] Leavy Mole, PA-C       Future Appointments             In 1 month Hester Alm BROCKS, MD [facility]   In 3 months Leavy Mole, PA-C [facility] [place]

## 2023-11-06 NOTE — Telephone Encounter (Signed)
 refill

## 2023-11-07 ENCOUNTER — Ambulatory Visit
Admission: RE | Admit: 2023-11-07 | Discharge: 2023-11-07 | Disposition: A | Discharge status: Home / Self Care | Source: Ambulatory Visit | Attending: Family Medicine | Admitting: Family Medicine

## 2023-11-07 ENCOUNTER — Encounter: Payer: Self-pay | Admitting: Internal Medicine

## 2023-11-07 ENCOUNTER — Ambulatory Visit
Admission: RE | Admit: 2023-11-07 | Discharge: 2023-11-07 | Disposition: A | Discharge status: Home / Self Care | Attending: Family Medicine | Admitting: Family Medicine

## 2023-11-07 DIAGNOSIS — R0789 Other chest pain: Secondary | ICD-10-CM | POA: Insufficient documentation

## 2023-11-07 DIAGNOSIS — I700 Atherosclerosis of aorta: Secondary | ICD-10-CM | POA: Diagnosis not present

## 2023-11-07 DIAGNOSIS — R0609 Other forms of dyspnea: Secondary | ICD-10-CM | POA: Diagnosis not present

## 2023-11-07 DIAGNOSIS — R0602 Shortness of breath: Secondary | ICD-10-CM | POA: Diagnosis not present

## 2023-11-07 DIAGNOSIS — R5383 Other fatigue: Secondary | ICD-10-CM | POA: Diagnosis not present

## 2023-11-07 DIAGNOSIS — R002 Palpitations: Secondary | ICD-10-CM | POA: Insufficient documentation

## 2023-11-07 DIAGNOSIS — R918 Other nonspecific abnormal finding of lung field: Secondary | ICD-10-CM | POA: Diagnosis not present

## 2023-11-08 NOTE — Addendum Note (Signed)
 Addended by: Anetria Harwick on: 11/08/2023 03:23 AM   Modules accepted: Level of Service

## 2023-11-12 NOTE — H&P (View-Only) (Signed)
 Cardiology Office Note    Date:  11/13/2023   ID:  [name], Elizabeth [birth year], MRN [id number]  PCP:  Leavy Mole, PA-C  Cardiologist:  Lonni Hanson, MD  Electrophysiologist:  None   Chief Complaint: Chest pain  History of Present Illness:   [name] is a 78 y.o. female with history of SVT, hyperlipidemia, asthma, depression, GAD, colon polyps, and ovarian cancer s/p resection and chemotherapy who presents for evaluation of chest pain, DOE, and palpitations.    Patient was initially evaluated by Dr. Hanson as a new patient 02/2019 for palpitations in the setting of increased stress.  There is a remote isolated episode of syncope while donating blood many years prior to that.  She reported a stress test next approximately 20 years ago as part of a CPE that she believed to be normal.  Zio patch 02/2019 showed predominant rhythm of sinus with average heart rate 64 bpm, rare PACs and PVCs.  There were 20 atrial runs lasting up to 12 seconds with a maximum heart rate of 184 bpm.  There was no sustained arrhythmias or prolonged pauses.  Patient triggered events corresponded to sinus rhythm.  Echo 04/2019 showed EF of 55 to 60%, no RWMA, and trivial MR.  She was seen in follow-up 04/2019 continuing to note episodic chest discomfort that was described as a pinch and was randomly occurring lasting for several seconds in duration with spontaneous resolution.  In this setting, patient underwent Lexiscan  MPI in 05/2019 which showed no significant ischemia with CT attenuation corrected images showing mild aortic atherosclerosis and no significant coronary artery calcification.  Overall, this was a low risk study.  Her tachypalpitations have been managed with as needed Lopressor  12.5 mg twice daily as needed.   Patient was most recently seen in our office 01/2023 overall doing okay from a cardiac perspective.  She reported shortness of breath and dyspnea on exertion which she attributed to a side effect  of her chemotherapy for ovarian cancer.  She was scheduled for updated echocardiogram which was completed 01/2023 and showed EF 60 to 65%, G1 DD, mild MR, and aortic sclerosis without evidence of stenosis.  Patient was recently seen by her PCP 11/05/2023 reporting 6 months of worsening shortness of breath, dyspnea on exertion, and palpitations.  Labs were ordered and overall reassuring including normal TSH and BNP.  Potassium and magnesium were normal as well.  Chest x-ray was ordered and negative.  Patient was placed on long-term heart monitor.  Patient presents today with concern for 6 months of progressive symptoms of exertional chest pain associated with palpitations and shortness of breath.  Patient reports she has been experiencing chest pressure/heaviness with exertion for the past 6 months.  Initially, this would come on with heavier exertion such as walking up hill or long distances.  Now, she experiences chest pressure with very little activity.  For example, she was baking cookies recently started experiencing chest pressure. This has significantly limited her activity. The episodes are associated with shortness of breath and palpitations. They typically resolve after about 10 to 15 minutes of resting.  She occasionally has symptoms at rest. Of note, there are new T wave inversions on EKG today. She denies lower extremity swelling and orthopnea. No bleeding or hematochezia.   Labs independently reviewed: 10/2023-Hgb 13.8, HCT 42.3, platelets 167, BUN 15, creatinine 0.99, sodium 141, potassium 4.4, normal LFTs, TSH wnl, BNP 47, magnesium 2.1 04/2022- TC 182, HDL 48, LDL 86, TG 134  Objective   Past Medical History:  Diagnosis Date   Allergy    Arthritis    Asthma    Atypical chest pain 03/14/2019   Atypical mole 06/12/2018   right prox lat thigh/mod   Atypical mole 05/19/2014   left sup med buttock/excision   Atypical mole 03/26/2014   right sup lat buttock   Atypical mole 03/26/2013    left lat mid back   Atypical mole 11/08/2006   right shoulder superior/excision   Basal cell carcinoma 06/12/2016   right medial sup canthus below eyebrow   Basal cell carcinoma 09/23/2014   left nasal bridge   Basal cell carcinoma 03/26/2013   left nasal sidewall   Basal cell carcinoma 08/27/2012   left nasal dorsum   Cancer (HCC)    shoulder, nose skin CA- basal cell CA   GERD (gastroesophageal reflux disease)    occasional   Hyperlipidemia    Osteopenia    Polyp of transverse colon    Right arm weakness 03/14/2019   Stress due to illness of family member 05/29/2018    Current Medications: Current Meds  Medication Sig   acetaminophen (TYLENOL) 325 MG tablet Take 650 mg by mouth every 6 (six) hours as needed.   albuterol  (VENTOLIN  HFA) 108 (90 Base) MCG/ACT inhaler Inhale 2 puffs into the lungs every 6 (six) hours as needed for wheezing or shortness of breath.   Alpha Lipoic Acid 200 MG CAPS Take by mouth.   aspirin EC 81 MG tablet Take 1 tablet (81 mg total) by mouth daily. Swallow whole.   busPIRone  (BUSPAR ) 5 MG tablet Take one tablet in the AM and then you can take a second as needed during the day   escitalopram  (LEXAPRO ) 20 MG tablet Take 1 tablet (20 mg total) by mouth daily.   hydrOXYzine  (ATARAX ) 25 MG tablet Take 1 tablet (25 mg total) by mouth every 8 (eight) hours as needed for anxiety (insomnia).   lovastatin  (MEVACOR ) 20 MG tablet TAKE 1 TABLET BY MOUTH EVERYDAY AT BEDTIME   melatonin 3 MG TABS tablet Take 3 mg by mouth at bedtime.   metoprolol  succinate (TOPROL -XL) 25 MG 24 hr tablet TAKE 1 TABLET (25 MG TOTAL) BY MOUTH AS NEEDED (FOR PALPITATIONS).   vitamin B-12 (CYANOCOBALAMIN ) 1000 MCG tablet Take 1,000 mcg by mouth daily.   VITAMIN D , CHOLECALCIFEROL, PO Take 500 mg by mouth daily.    Allergies:   Tetanus toxoid-containing vaccines and Sulfa antibiotics   Social History   Socioeconomic History   Marital status: Married    Spouse name: [name]   Number of  children: 2   Years of education: Not on file   Highest education level: Bachelor's degree (e.g., BA, AB, BS)  Occupational History   Occupation: Retired  Tobacco Use   Smoking status: Former    Current packs/day: 0.00    Average packs/day: 1.5 packs/day for 20.0 years (30.0 ttl pk-yrs)    Types: Cigarettes    Start date: 56    Quit date: 1993    Years since quitting: 32.7    Passive exposure: Past   Smokeless tobacco: Never  Vaping Use   Vaping status: Never Used  Substance and Sexual Activity   Alcohol use: Yes    Alcohol/week: 1.0 standard drink of alcohol    Types: 1 Glasses of wine per week    Comment: one glass of wine once a month   Drug use: Never   Sexual activity: Not Currently  Other Topics  Concern   Not on file  Social History Narrative   Lives with husband in [place]; quit smoking > 30 years; once every 1-2 months; retd. Teacher- elementary school;    Social Drivers of Health   Financial Resource Strain: Low Risk  (05/09/2022)   Overall Financial Resource Strain (CARDIA)    Difficulty of Paying Living Expenses: Not hard at all  Food Insecurity: No Food Insecurity (05/09/2022)   Hunger Vital Sign    Worried About Running Out of Food in the Last Year: Never true    Ran Out of Food in the Last Year: Never true  Transportation Needs: No Transportation Needs (05/09/2022)   PRAPARE - Administrator, Civil Service (Medical): No    Lack of Transportation (Non-Medical): No  Physical Activity: Insufficiently Active (05/09/2022)   Exercise Vital Sign    Days of Exercise per Week: 3 days    Minutes of Exercise per Session: 20 min  Stress: Stress Concern Present (05/09/2022)   [employer] of Occupational Health - Occupational Stress Questionnaire    Feeling of Stress : To some extent  Social Connections: Socially Integrated (05/09/2022)   Social Connection and Isolation Panel    Frequency of Communication with Friends and Family: Three times a week     Frequency of Social Gatherings with Friends and Family: Three times a week    Attends Religious Services: More than 4 times per year    Active Member of Clubs or Organizations: Yes    Attends Engineer, structural: More than 4 times per year    Marital Status: Married     Family History:  The patient's family history includes Alcohol abuse in her sister; Alzheimer's disease in her father; Colon cancer in her paternal uncle; Diabetes in her father; Down syndrome in her sister; Heart attack (age of onset: 79) in her father; Heart disease in her sister; Liver cancer (age of onset: 72) in her son; Lung cancer in her mother and paternal grandmother; Lung cancer (age of onset: 69) in her sister; Melanoma in her maternal grandmother; Parkinson's disease in her father; Stomach cancer in her paternal grandfather; Stroke in her paternal grandmother. There is no history of Esophageal cancer or Breast cancer.  ROS:   12-point review of systems is negative unless otherwise noted in the HPI.  EKGs/Other Studies Reviewed:    Studies reviewed were summarized above. The additional studies were reviewed today:  01/2023 Echo complete 1. Left ventricular ejection fraction, by estimation, is 60 to 65%. The  left ventricle has normal function. The left ventricle has no regional  wall motion abnormalities. Left ventricular diastolic parameters are  consistent with Grade I diastolic  dysfunction (impaired relaxation). The average left ventricular global  longitudinal strain is -22.5 %.   2. Right ventricular systolic function is normal. The right ventricular  size is normal. Tricuspid regurgitation signal is inadequate for assessing  PA pressure.   3. The mitral valve is normal in structure. Mild mitral valve  regurgitation. No evidence of mitral stenosis.   4. The aortic valve is normal in structure. There is mild calcification  of the aortic valve. Aortic valve regurgitation is not visualized.  Aortic  valve sclerosis is present, with no evidence of aortic valve stenosis.   5. The inferior vena cava is normal in size with greater than 50%  respiratory variability, suggesting right atrial pressure of 3 mmHg.   08/2021 Lexiscan  myoview    Normal pharmacologic myocardial perfusion stress test without  evidence of significant ischemia or scar.   Left ventricular systolic function is normal (LVEF > 65%).   There is no significant coronary artery calcification.  Aortic atherosclerosis is noted on the attenuation correction CT.   No significant change from prior study on 05/15/2019.   This is a low risk study.  EKG:  EKG personally reviewed by me today EKG Interpretation Date/Time:  Tuesday November 13 2023 08:12:14 EDT Ventricular Rate:  59 PR Interval:  164 QRS Duration:  72 QT Interval:  434 QTC Calculation: 429 R Axis:   6  Text Interpretation: Sinus bradycardia T wave abnormality, consider lateral ischemia When compared with ECG of 16-Jan-2023 14:00, Inverted T waves have replaced nonspecific T wave abnormality in QT has lengthened Confirmed by Lorene Sinclair ([id number]) on 11/13/2023 8:14:31 AM  PHYSICAL EXAM:    VS:  BP 120/60 (BP Location: Left Arm, Patient Position: Sitting, Cuff Size: Normal)   Pulse (!) 59   Ht 5' 4 (1.626 m)   Wt 175 lb (79.4 kg)   LMP  (LMP Unknown)   SpO2 96%   BMI 30.04 kg/m   BMI: Body mass index is 30.04 kg/m.  GEN: Well nourished, well developed in no acute distress NECK: No JVD; No carotid bruits CARDIAC: RRR, no murmurs, rubs, gallops RESPIRATORY:  Clear to auscultation without rales, wheezing or rhonchi  ABDOMEN: Soft, non-tender, non-distended EXTREMITIES: No edema; No deformity  Wt Readings from Last 3 Encounters:  11/13/23 175 lb (79.4 kg)  08/20/23 183 lb (83 kg)  02/21/23 183 lb 4.8 oz (83.1 kg)                  ASSESSMENT & PLAN:   Unstable angina Dyspnea on exertion - Recently evaluated by PCP with overall reassuring  labs and CXR. Today, patient reports 6 months of progressive angina with associated dyspnea and palpitations. Chest pain now occurring at rest and consistent with unstable angina. EKG today shows new T wave abnormalities in inferolateral leads. Most recent echo 01/2023 with normal LV systolic function. Discussed options for ischemic evaluation including stress testing and coronary CTA. Given concerning symptoms and new EKG changes, shared decision was made to proceed with left heart catheterization. Start ASA 81 mg daily. She is continued on statin therapy with low threshold to transition to high intensity statin pending cath results.   PSVT - Intermittent episodes of palpitations. PCP ordered Zio monitor which she is wearing now. EKG shows sinus rhythm. She is continued on metoprolol  succinate 25 mg daily as needed for palpitations.   Hyperlipidemia - Most recent lipid panel 04/2022 with LDL 86. She is continued on lovastatin  20 mg daily with low threshold to transition to high intensity statin pending results of cath. Anticipate repeat lipid panel at follow up.    Informed Consent   Shared Decision Making/Informed Consent The risks [stroke (1 in 1000), death (1 in 1000), kidney failure [usually temporary] (1 in 500), bleeding (1 in 200), allergic reaction [possibly serious] (1 in 200)], benefits (diagnostic support and management of coronary artery disease) and alternatives of a cardiac catheterization were discussed in detail with Ms. Snoke and she is willing to proceed.      Disposition: F/u with Dr. Mady or an APP in 2-4 weeks.   Medication Adjustments/Labs and Tests Ordered: Current medicines are reviewed at length with the patient today.  Concerns regarding medicines are outlined above. Medication changes, Labs and Tests ordered today are summarized above and listed in the Patient Instructions accessible  in Encounters.   Bonney Lesley Maffucci, PA-C 11/13/2023 9:23 AM     [facility] [place] [address] [place], [place] [address] [phone]

## 2023-11-12 NOTE — Progress Notes (Unsigned)
 Cardiology Office Note    Date:  11/13/2023   ID:  Maeryn, Mcgath 1945-12-19, MRN [id number]  PCP:  Leavy Mole, PA-C  Cardiologist:  Lonni Hanson, MD  Electrophysiologist:  None   Chief Complaint: Chest pain  History of Present Illness:   [name] is a 78 y.o. female with history of SVT, hyperlipidemia, asthma, depression, GAD, colon polyps, and ovarian cancer s/p resection and chemotherapy who presents for evaluation of chest pain, DOE, and palpitations.    Patient was initially evaluated by Dr. Hanson as a new patient 02/2019 for palpitations in the setting of increased stress.  There is a remote isolated episode of syncope while donating blood many years prior to that.  She reported a stress test next approximately 20 years ago as part of a CPE that she believed to be normal.  Zio patch 02/2019 showed predominant rhythm of sinus with average heart rate 64 bpm, rare PACs and PVCs.  There were 20 atrial runs lasting up to 12 seconds with a maximum heart rate of 184 bpm.  There was no sustained arrhythmias or prolonged pauses.  Patient triggered events corresponded to sinus rhythm.  Echo 04/2019 showed EF of 55 to 60%, no RWMA, and trivial MR.  She was seen in follow-up 04/2019 continuing to note episodic chest discomfort that was described as a pinch and was randomly occurring lasting for several seconds in duration with spontaneous resolution.  In this setting, patient underwent Lexiscan  MPI in 05/2019 which showed no significant ischemia with CT attenuation corrected images showing mild aortic atherosclerosis and no significant coronary artery calcification.  Overall, this was a low risk study.  Her tachypalpitations have been managed with as needed Lopressor  12.5 mg twice daily as needed.   Patient was most recently seen in our office 01/2023 overall doing okay from a cardiac perspective.  She reported shortness of breath and dyspnea on exertion which she attributed to a side effect  of her chemotherapy for ovarian cancer.  She was scheduled for updated echocardiogram which was completed 01/2023 and showed EF 60 to 65%, G1 DD, mild MR, and aortic sclerosis without evidence of stenosis.  Patient was recently seen by her PCP 11/05/2023 reporting 6 months of worsening shortness of breath, dyspnea on exertion, and palpitations.  Labs were ordered and overall reassuring including normal TSH and BNP.  Potassium and magnesium were normal as well.  Chest x-ray was ordered and negative.  Patient was placed on long-term heart monitor.  Patient presents today with concern for 6 months of progressive symptoms of exertional chest pain associated with palpitations and shortness of breath.  Patient reports she has been experiencing chest pressure/heaviness with exertion for the past 6 months.  Initially, this would come on with heavier exertion such as walking up hill or long distances.  Now, she experiences chest pressure with very little activity.  For example, she was baking cookies recently started experiencing chest pressure. This has significantly limited her activity. The episodes are associated with shortness of breath and palpitations. They typically resolve after about 10 to 15 minutes of resting.  She occasionally has symptoms at rest. Of note, there are new T wave inversions on EKG today. She denies lower extremity swelling and orthopnea. No bleeding or hematochezia.   Labs independently reviewed: 10/2023-Hgb 13.8, HCT 42.3, platelets 167, BUN 15, creatinine 0.99, sodium 141, potassium 4.4, normal LFTs, TSH wnl, BNP 47, magnesium 2.1 04/2022- TC 182, HDL 48, LDL 86, TG 134  Objective   Past Medical History:  Diagnosis Date   Allergy    Arthritis    Asthma    Atypical chest pain 03/14/2019   Atypical mole 06/12/2018   right prox lat thigh/mod   Atypical mole 05/19/2014   left sup med buttock/excision   Atypical mole 03/26/2014   right sup lat buttock   Atypical mole 03/26/2013    left lat mid back   Atypical mole 11/08/2006   right shoulder superior/excision   Basal cell carcinoma 06/12/2016   right medial sup canthus below eyebrow   Basal cell carcinoma 09/23/2014   left nasal bridge   Basal cell carcinoma 03/26/2013   left nasal sidewall   Basal cell carcinoma 08/27/2012   left nasal dorsum   Cancer (HCC)    shoulder, nose skin CA- basal cell CA   GERD (gastroesophageal reflux disease)    occasional   Hyperlipidemia    Osteopenia    Polyp of transverse colon    Right arm weakness 03/14/2019   Stress due to illness of family member 05/29/2018    Current Medications: Current Meds  Medication Sig   acetaminophen (TYLENOL) 325 MG tablet Take 650 mg by mouth every 6 (six) hours as needed.   albuterol  (VENTOLIN  HFA) 108 (90 Base) MCG/ACT inhaler Inhale 2 puffs into the lungs every 6 (six) hours as needed for wheezing or shortness of breath.   Alpha Lipoic Acid 200 MG CAPS Take by mouth.   aspirin EC 81 MG tablet Take 1 tablet (81 mg total) by mouth daily. Swallow whole.   busPIRone  (BUSPAR ) 5 MG tablet Take one tablet in the AM and then you can take a second as needed during the day   escitalopram  (LEXAPRO ) 20 MG tablet Take 1 tablet (20 mg total) by mouth daily.   hydrOXYzine  (ATARAX ) 25 MG tablet Take 1 tablet (25 mg total) by mouth every 8 (eight) hours as needed for anxiety (insomnia).   lovastatin  (MEVACOR ) 20 MG tablet TAKE 1 TABLET BY MOUTH EVERYDAY AT BEDTIME   melatonin 3 MG TABS tablet Take 3 mg by mouth at bedtime.   metoprolol  succinate (TOPROL -XL) 25 MG 24 hr tablet TAKE 1 TABLET (25 MG TOTAL) BY MOUTH AS NEEDED (FOR PALPITATIONS).   vitamin B-12 (CYANOCOBALAMIN ) 1000 MCG tablet Take 1,000 mcg by mouth daily.   VITAMIN D , CHOLECALCIFEROL, PO Take 500 mg by mouth daily.    Allergies:   Tetanus toxoid-containing vaccines and Sulfa antibiotics   Social History   Socioeconomic History   Marital status: Married    Spouse name: [name]   Number of  children: 2   Years of education: Not on file   Highest education level: Bachelor's degree (e.g., BA, AB, BS)  Occupational History   Occupation: Retired  Tobacco Use   Smoking status: Former    Current packs/day: 0.00    Average packs/day: 1.5 packs/day for 20.0 years (30.0 ttl pk-yrs)    Types: Cigarettes    Start date: 16    Quit date: 1993    Years since quitting: 32.7    Passive exposure: Past   Smokeless tobacco: Never  Vaping Use   Vaping status: Never Used  Substance and Sexual Activity   Alcohol use: Yes    Alcohol/week: 1.0 standard drink of alcohol    Types: 1 Glasses of wine per week    Comment: one glass of wine once a month   Drug use: Never   Sexual activity: Not Currently  Other Topics  Concern   Not on file  Social History Narrative   Lives with husband in [place]; quit smoking > 30 years; once every 1-2 months; retd. Teacher- elementary school;    Social Drivers of Health   Financial Resource Strain: Low Risk  (05/09/2022)   Overall Financial Resource Strain (CARDIA)    Difficulty of Paying Living Expenses: Not hard at all  Food Insecurity: No Food Insecurity (05/09/2022)   Hunger Vital Sign    Worried About Running Out of Food in the Last Year: Never true    Ran Out of Food in the Last Year: Never true  Transportation Needs: No Transportation Needs (05/09/2022)   PRAPARE - Administrator, Civil Service (Medical): No    Lack of Transportation (Non-Medical): No  Physical Activity: Insufficiently Active (05/09/2022)   Exercise Vital Sign    Days of Exercise per Week: 3 days    Minutes of Exercise per Session: 20 min  Stress: Stress Concern Present (05/09/2022)   [employer] of Occupational Health - Occupational Stress Questionnaire    Feeling of Stress : To some extent  Social Connections: Socially Integrated (05/09/2022)   Social Connection and Isolation Panel    Frequency of Communication with Friends and Family: Three times a week     Frequency of Social Gatherings with Friends and Family: Three times a week    Attends Religious Services: More than 4 times per year    Active Member of Clubs or Organizations: Yes    Attends Engineer, structural: More than 4 times per year    Marital Status: Married     Family History:  The patient's family history includes Alcohol abuse in her sister; Alzheimer's disease in her father; Colon cancer in her paternal uncle; Diabetes in her father; Down syndrome in her sister; Heart attack (age of onset: 26) in her father; Heart disease in her sister; Liver cancer (age of onset: 49) in her son; Lung cancer in her mother and paternal grandmother; Lung cancer (age of onset: 51) in her sister; Melanoma in her maternal grandmother; Parkinson's disease in her father; Stomach cancer in her paternal grandfather; Stroke in her paternal grandmother. There is no history of Esophageal cancer or Breast cancer.  ROS:   12-point review of systems is negative unless otherwise noted in the HPI.  EKGs/Other Studies Reviewed:    Studies reviewed were summarized above. The additional studies were reviewed today:  01/2023 Echo complete 1. Left ventricular ejection fraction, by estimation, is 60 to 65%. The  left ventricle has normal function. The left ventricle has no regional  wall motion abnormalities. Left ventricular diastolic parameters are  consistent with Grade I diastolic  dysfunction (impaired relaxation). The average left ventricular global  longitudinal strain is -22.5 %.   2. Right ventricular systolic function is normal. The right ventricular  size is normal. Tricuspid regurgitation signal is inadequate for assessing  PA pressure.   3. The mitral valve is normal in structure. Mild mitral valve  regurgitation. No evidence of mitral stenosis.   4. The aortic valve is normal in structure. There is mild calcification  of the aortic valve. Aortic valve regurgitation is not visualized.  Aortic  valve sclerosis is present, with no evidence of aortic valve stenosis.   5. The inferior vena cava is normal in size with greater than 50%  respiratory variability, suggesting right atrial pressure of 3 mmHg.   08/2021 Lexiscan  myoview    Normal pharmacologic myocardial perfusion stress test without  evidence of significant ischemia or scar.   Left ventricular systolic function is normal (LVEF > 65%).   There is no significant coronary artery calcification.  Aortic atherosclerosis is noted on the attenuation correction CT.   No significant change from prior study on 05/15/2019.   This is a low risk study.  EKG:  EKG personally reviewed by me today EKG Interpretation Date/Time:  Tuesday November 13 2023 08:12:14 EDT Ventricular Rate:  59 PR Interval:  164 QRS Duration:  72 QT Interval:  434 QTC Calculation: 429 R Axis:   6  Text Interpretation: Sinus bradycardia T wave abnormality, consider lateral ischemia When compared with ECG of 16-Jan-2023 14:00, Inverted T waves have replaced nonspecific T wave abnormality in QT has lengthened Confirmed by Lorene Sinclair ([id number]) on 11/13/2023 8:14:31 AM  PHYSICAL EXAM:    VS:  BP 120/60 (BP Location: Left Arm, Patient Position: Sitting, Cuff Size: Normal)   Pulse (!) 59   Ht 5' 4 (1.626 m)   Wt 175 lb (79.4 kg)   LMP  (LMP Unknown)   SpO2 96%   BMI 30.04 kg/m   BMI: Body mass index is 30.04 kg/m.  GEN: Well nourished, well developed in no acute distress NECK: No JVD; No carotid bruits CARDIAC: RRR, no murmurs, rubs, gallops RESPIRATORY:  Clear to auscultation without rales, wheezing or rhonchi  ABDOMEN: Soft, non-tender, non-distended EXTREMITIES: No edema; No deformity  Wt Readings from Last 3 Encounters:  11/13/23 175 lb (79.4 kg)  08/20/23 183 lb (83 kg)  02/21/23 183 lb 4.8 oz (83.1 kg)                  ASSESSMENT & PLAN:   Unstable angina Dyspnea on exertion - Recently evaluated by PCP with overall reassuring  labs and CXR. Today, patient reports 6 months of progressive angina with associated dyspnea and palpitations. Chest pain now occurring at rest and consistent with unstable angina. EKG today shows new T wave abnormalities in inferolateral leads. Most recent echo 01/2023 with normal LV systolic function. Discussed options for ischemic evaluation including stress testing and coronary CTA. Given concerning symptoms and new EKG changes, shared decision was made to proceed with left heart catheterization. Start ASA 81 mg daily. She is continued on statin therapy with low threshold to transition to high intensity statin pending cath results.   PSVT - Intermittent episodes of palpitations. PCP ordered Zio monitor which she is wearing now. EKG shows sinus rhythm. She is continued on metoprolol  succinate 25 mg daily as needed for palpitations.   Hyperlipidemia - Most recent lipid panel 04/2022 with LDL 86. She is continued on lovastatin  20 mg daily with low threshold to transition to high intensity statin pending results of cath. Anticipate repeat lipid panel at follow up.    Informed Consent   Shared Decision Making/Informed Consent The risks [stroke (1 in 1000), death (1 in 1000), kidney failure [usually temporary] (1 in 500), bleeding (1 in 200), allergic reaction [possibly serious] (1 in 200)], benefits (diagnostic support and management of coronary artery disease) and alternatives of a cardiac catheterization were discussed in detail with Ms. Ruggles and she is willing to proceed.      Disposition: F/u with Dr. Mady or an APP in 2-4 weeks.   Medication Adjustments/Labs and Tests Ordered: Current medicines are reviewed at length with the patient today.  Concerns regarding medicines are outlined above. Medication changes, Labs and Tests ordered today are summarized above and listed in the Patient Instructions accessible  in Encounters.   Bonney Lesley Maffucci, PA-C 11/13/2023 9:23 AM       [facility] [address] [place], [place] [address] [phone]

## 2023-11-13 ENCOUNTER — Encounter: Payer: Self-pay | Admitting: Cardiology

## 2023-11-13 ENCOUNTER — Ambulatory Visit: Attending: Cardiology | Admitting: Physician Assistant

## 2023-11-13 VITALS — BP 120/60 | HR 59 | Ht 64.0 in | Wt 175.0 lb

## 2023-11-13 DIAGNOSIS — R0602 Shortness of breath: Secondary | ICD-10-CM | POA: Diagnosis not present

## 2023-11-13 DIAGNOSIS — I200 Unstable angina: Secondary | ICD-10-CM

## 2023-11-13 DIAGNOSIS — R0609 Other forms of dyspnea: Secondary | ICD-10-CM

## 2023-11-13 DIAGNOSIS — E782 Mixed hyperlipidemia: Secondary | ICD-10-CM | POA: Diagnosis not present

## 2023-11-13 DIAGNOSIS — I471 Supraventricular tachycardia, unspecified: Secondary | ICD-10-CM

## 2023-11-13 MED ORDER — ASPIRIN 81 MG PO TBEC
81.0000 mg | DELAYED_RELEASE_TABLET | Freq: Every day | ORAL | Status: DC
Start: 2023-11-13 — End: 2023-11-20

## 2023-11-13 NOTE — Patient Instructions (Signed)
 Medication Instructions:  Your physician recommends the following medication changes.  START TAKING: ASPIRIN  81 mg daily  *If you need a refill on your cardiac medications before your next appointment, please call your pharmacy*  Lab Work: No labs ordered today  If you have labs (blood work) drawn today and your tests are completely normal, you will receive your results only by: MyChart Message (if you have MyChart) OR A paper copy in the mail If you have any lab test that is abnormal or we need to change your treatment, we will call you to review the results.  Testing/Procedures:  [facility] [place] [facility] [place] [place] [address] [place] [address] Dept: [phone] Loc: [phone]  [name]  11/13/2023  You are scheduled for a Cardiac Catheterization on Tuesday, October 7 with Dr. Lonni End.  1. Please arrive at the [facility] Entrance of [facility], [address] [place], [place] [address] at 9:00 AM (This is 1 hour(s) prior to your procedure time).  Proceed to the Check-In Desk directly inside the entrance.  Procedure Parking: Use the entrance off of the [facility] [address] side of the hospital. Turn right upon entering and follow the driveway to parking that is directly in front of the [facility] Center. There is no valet parking available at this entrance, however there is an awning directly in front of the [facility] Center for drop off/ pick up for patients.  Special note: Every effort is made to have your procedure done on time. Please understand that emergencies sometimes delay scheduled procedures.  2. Diet: Nothing to eat after midnight.   3. Hydration: You need to be well hydrated before your procedure. On October 7, you may drink approved liquids (see below) until 2 hours before the procedure, with 16 oz of water as your last intake.   List of  approved liquids water, clear juice, clear tea, black coffee, fruit juices, non-citric and without pulp, carbonated beverages, Gatorade, Kool -Aid, plain Jello-O and plain ice popsicles.  4. Labs: You will need to have blood drawn on Tuesday, September 2 at [facility], Go to 1st desk on your right to register.  Address: [address] [place], [place] [address]  Open: 8am - 5pm  Phone: [phone]. You do not need to be fasting.  5. Medication instructions in preparation for your procedure:   Contrast Allergy: No  On the morning of your procedure, take your Aspirin 81 mg and any morning medicines NOT listed above.  You may use sips of water.  6. Plan to go home the same day, you will only stay overnight if medically necessary. 7. Bring a current list of your medications and current insurance cards. 8. You MUST have a responsible person to drive you home. 9. Someone MUST be with you the first 24 hours after you arrive home or your discharge will be delayed. 10. Please wear clothes that are easy to get on and off and wear slip-on shoes.  Thank you for allowing us  to care for you!   -- [facility]   Follow-Up: At [facility], you and your health needs are our priority.  As part of our continuing mission to provide you with exceptional heart care, our providers are all part of one team.  This team includes your primary Cardiologist (physician) and Advanced Practice Providers or APPs (  Physician Assistants and Nurse Practitioners) who all work together to provide you with the care you need, when you need it.  Your next appointment:   3 - 4 week(s)  Provider:   You may see Lonni Hanson, MD or one of the following Advanced Practice Providers on your designated Care Team:   Lonni Meager, NP Lesley Maffucci, PA-C Bernardino Bring, PA-C Cadence Brookdale, PA-C Tylene Lunch, NP Barnie Hila, NP

## 2023-11-20 ENCOUNTER — Encounter: Payer: Self-pay | Admitting: Internal Medicine

## 2023-11-20 ENCOUNTER — Encounter
Admission: RE | Disposition: A | Discharge status: Home / Self Care | Payer: Self-pay | Source: Home / Self Care | Attending: Internal Medicine

## 2023-11-20 ENCOUNTER — Ambulatory Visit
Admission: RE | Admit: 2023-11-20 | Discharge: 2023-11-20 | Disposition: A | Discharge status: Home / Self Care | Attending: Internal Medicine | Admitting: Internal Medicine

## 2023-11-20 ENCOUNTER — Other Ambulatory Visit: Payer: Self-pay

## 2023-11-20 DIAGNOSIS — E785 Hyperlipidemia, unspecified: Secondary | ICD-10-CM | POA: Insufficient documentation

## 2023-11-20 DIAGNOSIS — I25110 Atherosclerotic heart disease of native coronary artery with unstable angina pectoris: Secondary | ICD-10-CM | POA: Diagnosis not present

## 2023-11-20 DIAGNOSIS — Z87891 Personal history of nicotine dependence: Secondary | ICD-10-CM | POA: Insufficient documentation

## 2023-11-20 DIAGNOSIS — I471 Supraventricular tachycardia, unspecified: Secondary | ICD-10-CM | POA: Diagnosis not present

## 2023-11-20 DIAGNOSIS — Z79899 Other long term (current) drug therapy: Secondary | ICD-10-CM | POA: Diagnosis not present

## 2023-11-20 DIAGNOSIS — I200 Unstable angina: Secondary | ICD-10-CM | POA: Diagnosis not present

## 2023-11-20 HISTORY — PX: LEFT HEART CATH AND CORONARY ANGIOGRAPHY: CATH118249

## 2023-11-20 SURGERY — LEFT HEART CATH AND CORONARY ANGIOGRAPHY
Anesthesia: Moderate Sedation | Laterality: Left

## 2023-11-20 MED ORDER — ASPIRIN 81 MG PO CHEW: 81.0000 mg | CHEWABLE_TABLET | ORAL | Status: DC

## 2023-11-20 MED ORDER — SODIUM CHLORIDE 0.9% FLUSH: 3.0000 mL | Freq: Two times a day (BID) | INTRAVENOUS | Status: DC

## 2023-11-20 MED ORDER — FENTANYL CITRATE (PF) 100 MCG/2ML IJ SOLN
INTRAMUSCULAR | Status: DC | PRN
  Administered 2023-11-20: 25 ug via INTRAVENOUS

## 2023-11-20 MED ORDER — SODIUM CHLORIDE 0.9% FLUSH: 3.0000 mL | INTRAVENOUS | Status: DC | PRN

## 2023-11-20 MED ORDER — MIDAZOLAM HCL 2 MG/2ML IJ SOLN
INTRAMUSCULAR | Status: AC
  Filled 2023-11-20: qty 2

## 2023-11-20 MED ORDER — ONDANSETRON HCL 4 MG/2ML IJ SOLN: 4.0000 mg | Freq: Four times a day (QID) | INTRAMUSCULAR | Status: DC | PRN

## 2023-11-20 MED ORDER — MIDAZOLAM HCL 2 MG/2ML IJ SOLN
INTRAMUSCULAR | Status: DC | PRN
  Administered 2023-11-20: 1 mg via INTRAVENOUS

## 2023-11-20 MED ORDER — HEPARIN (PORCINE) IN NACL 1000-0.9 UT/500ML-% IV SOLN
INTRAVENOUS | Status: DC | PRN
  Administered 2023-11-20: 1000 mL

## 2023-11-20 MED ORDER — VERAPAMIL HCL 2.5 MG/ML IV SOLN
INTRAVENOUS | Status: AC
Start: 2023-11-20 — End: 2023-11-20
  Filled 2023-11-20: qty 2

## 2023-11-20 MED ORDER — HYDRALAZINE HCL 20 MG/ML IJ SOLN: 10.0000 mg | INTRAMUSCULAR | Status: DC | PRN

## 2023-11-20 MED ORDER — FREE WATER
500.0000 mL | Freq: Once | Status: AC
  Administered 2023-11-20: 500 mL via ORAL

## 2023-11-20 MED ORDER — ACETAMINOPHEN 325 MG PO TABS: 650.0000 mg | ORAL_TABLET | ORAL | Status: DC | PRN

## 2023-11-20 MED ORDER — HEPARIN SODIUM (PORCINE) 1000 UNIT/ML IJ SOLN
INTRAMUSCULAR | Status: AC
  Filled 2023-11-20: qty 10

## 2023-11-20 MED ORDER — HEPARIN SODIUM (PORCINE) 1000 UNIT/ML IJ SOLN
INTRAMUSCULAR | Status: DC | PRN
  Administered 2023-11-20: 4000 [IU] via INTRAVENOUS

## 2023-11-20 MED ORDER — SODIUM CHLORIDE 0.9 % IV SOLN: 250.0000 mL | INTRAVENOUS | Status: DC | PRN

## 2023-11-20 MED ORDER — RANOLAZINE ER 500 MG PO TB12: 500.0000 mg | ORAL_TABLET | Freq: Two times a day (BID) | ORAL | 5 refills | Status: DC

## 2023-11-20 MED ORDER — SODIUM CHLORIDE 0.9 % IV SOLN
250.0000 mL | INTRAVENOUS | Status: DC | PRN
  Administered 2023-11-20: 250 mL via INTRAVENOUS

## 2023-11-20 MED ORDER — IOHEXOL 300 MG/ML  SOLN
INTRAMUSCULAR | Status: DC | PRN
  Administered 2023-11-20: 43 mL

## 2023-11-20 MED ORDER — VERAPAMIL HCL 2.5 MG/ML IV SOLN
INTRAVENOUS | Status: DC | PRN
  Administered 2023-11-20 (×2): 2.5 mg via INTRA_ARTERIAL

## 2023-11-20 MED ORDER — LIDOCAINE HCL (PF) 1 % IJ SOLN
INTRAMUSCULAR | Status: DC | PRN
  Administered 2023-11-20: 2 mL

## 2023-11-20 MED ORDER — LABETALOL HCL 5 MG/ML IV SOLN: 10.0000 mg | INTRAVENOUS | Status: DC | PRN

## 2023-11-20 MED ORDER — FENTANYL CITRATE (PF) 100 MCG/2ML IJ SOLN
INTRAMUSCULAR | Status: AC
  Filled 2023-11-20: qty 2

## 2023-11-20 MED ORDER — LIDOCAINE HCL 1 % IJ SOLN
INTRAMUSCULAR | Status: AC
  Filled 2023-11-20: qty 20

## 2023-11-20 SURGICAL SUPPLY — 9 items
CATH INFINITI 5 FR JL3.5 (CATHETERS) IMPLANT
CATH INFINITI JR4 5F (CATHETERS) IMPLANT
DEVICE RAD TR BAND REGULAR (VASCULAR PRODUCTS) IMPLANT
DRAPE BRACHIAL (DRAPES) IMPLANT
GLIDESHEATH SLEND SS 6F .021 (SHEATH) IMPLANT
GUIDEWIRE INQWIRE 1.5J.035X260 (WIRE) IMPLANT
PACK CARDIAC CATH (CUSTOM PROCEDURE TRAY) ×1 IMPLANT
SET ATX-X65L (MISCELLANEOUS) IMPLANT
STATION PROTECTION PRESSURIZED (MISCELLANEOUS) IMPLANT

## 2023-11-20 NOTE — Interval H&P Note (Signed)
 History and Physical Interval Note:  11/20/2023 10:12 AM  [name]  has presented today for surgery, with the diagnosis of unstable angina.  The various methods of treatment have been discussed with the patient and family. After consideration of risks, benefits and other options for treatment, the patient has consented to  Procedure(s): LEFT HEART CATH AND CORONARY ANGIOGRAPHY (Left) as a surgical intervention.  The patient's history has been reviewed, patient examined, no change in status, stable for surgery.  I have reviewed the patient's chart and labs.  Questions were answered to the patient's satisfaction.    Cath Lab Visit (complete for each Cath Lab visit)  Clinical Evaluation Leading to the Procedure:   ACS: No.  Non-ACS:    Anginal Classification: CCS IV  Anti-ischemic medical therapy: No Therapy  Non-Invasive Test Results: No non-invasive testing performed  Prior CABG: No previous CABG  Rishab Stoudt

## 2023-11-22 ENCOUNTER — Emergency Department
Admission: EM | Admit: 2023-11-22 | Discharge: 2023-11-22 | Disposition: A | Discharge status: Home / Self Care | Source: Ambulatory Visit | Attending: Emergency Medicine | Admitting: Emergency Medicine

## 2023-11-22 ENCOUNTER — Other Ambulatory Visit: Payer: Self-pay

## 2023-11-22 ENCOUNTER — Encounter: Payer: Self-pay | Admitting: Intensive Care

## 2023-11-22 ENCOUNTER — Telehealth: Payer: Self-pay | Admitting: Internal Medicine

## 2023-11-22 ENCOUNTER — Emergency Department

## 2023-11-22 DIAGNOSIS — J45909 Unspecified asthma, uncomplicated: Secondary | ICD-10-CM | POA: Insufficient documentation

## 2023-11-22 DIAGNOSIS — R55 Syncope and collapse: Secondary | ICD-10-CM | POA: Diagnosis not present

## 2023-11-22 DIAGNOSIS — Z8543 Personal history of malignant neoplasm of ovary: Secondary | ICD-10-CM | POA: Diagnosis not present

## 2023-11-22 DIAGNOSIS — R42 Dizziness and giddiness: Secondary | ICD-10-CM | POA: Diagnosis not present

## 2023-11-22 DIAGNOSIS — R002 Palpitations: Secondary | ICD-10-CM | POA: Diagnosis not present

## 2023-11-22 DIAGNOSIS — R0989 Other specified symptoms and signs involving the circulatory and respiratory systems: Secondary | ICD-10-CM | POA: Diagnosis not present

## 2023-11-22 DIAGNOSIS — R112 Nausea with vomiting, unspecified: Secondary | ICD-10-CM | POA: Diagnosis not present

## 2023-11-22 LAB — URINALYSIS, ROUTINE W REFLEX MICROSCOPIC
Bilirubin Urine: NEGATIVE
Glucose, UA: NEGATIVE mg/dL
Hgb urine dipstick: NEGATIVE
Ketones, ur: NEGATIVE mg/dL
Leukocytes,Ua: NEGATIVE
Nitrite: NEGATIVE
Protein, ur: NEGATIVE mg/dL
Specific Gravity, Urine: 1.027 (ref 1.005–1.030)
pH: 6 (ref 5.0–8.0)

## 2023-11-22 LAB — TROPONIN I (HIGH SENSITIVITY)
Troponin I (High Sensitivity): 3 ng/L (ref <18)
Troponin I (High Sensitivity): 3 ng/L (ref <18)

## 2023-11-22 LAB — BASIC METABOLIC PANEL WITH GFR
Anion gap: 6 (ref 5–15)
BUN: 16 mg/dL (ref 8–23)
CO2: 29 mmol/L (ref 22–32)
Calcium: 8.9 mg/dL (ref 8.9–10.3)
Chloride: 104 mmol/L (ref 98–111)
Creatinine, Ser: 1.07 mg/dL — ABNORMAL HIGH (ref 0.44–1.00)
GFR, Estimated: 53 mL/min — ABNORMAL LOW (ref >60)
Glucose, Bld: 139 mg/dL — ABNORMAL HIGH (ref 70–99)
Potassium: 4.3 mmol/L (ref 3.5–5.1)
Sodium: 139 mmol/L (ref 135–145)

## 2023-11-22 LAB — HEPATIC FUNCTION PANEL
ALT: 17 U/L (ref 0–44)
AST: 19 U/L (ref 15–41)
Albumin: 4 g/dL (ref 3.5–5.0)
Alkaline Phosphatase: 48 U/L (ref 38–126)
Bilirubin, Direct: 0.1 mg/dL (ref 0.0–0.2)
Indirect Bilirubin: 0.5 mg/dL (ref 0.3–0.9)
Total Bilirubin: 0.6 mg/dL (ref 0.0–1.2)
Total Protein: 6.5 g/dL (ref 6.5–8.1)

## 2023-11-22 LAB — CBC
HCT: 42 % (ref 36.0–46.0)
Hemoglobin: 14.1 g/dL (ref 12.0–15.0)
MCH: 32.3 pg (ref 26.0–34.0)
MCHC: 33.6 g/dL (ref 30.0–36.0)
MCV: 96.3 fL (ref 80.0–100.0)
Platelets: 160 K/uL (ref 150–400)
RBC: 4.36 MIL/uL (ref 3.87–5.11)
RDW: 13.2 % (ref 11.5–15.5)
WBC: 15.9 K/uL — ABNORMAL HIGH (ref 4.0–10.5)
nRBC: 0 % (ref 0.0–0.2)

## 2023-11-22 LAB — LIPASE, BLOOD: Lipase: 38 U/L (ref 11–51)

## 2023-11-22 LAB — MAGNESIUM: Magnesium: 2 mg/dL (ref 1.7–2.4)

## 2023-11-22 MED ORDER — SODIUM CHLORIDE 0.9 % IV BOLUS
500.0000 mL | Freq: Once | INTRAVENOUS | Status: AC
  Administered 2023-11-22: 500 mL via INTRAVENOUS

## 2023-11-22 NOTE — Telephone Encounter (Signed)
 Per triage notes:  Pt dizzy, hot, and throwing up and had instructed pt to call 911 or go to Emergency Department.  Note stated pt had  called a friend to take her to Emergency Department.  Placed a follow up phone call to patient. Pt answered phone and stated that her friend had drove her to the ED and they were pulling up at the front entrance of the [facility] Emergency Department

## 2023-11-22 NOTE — Telephone Encounter (Signed)
 STAT if patient feels like he/she is going to faint   Are you dizzy, lightheaded, or faint now? Only when she moves fasy Have you passed out? No, IF YES MOVE TO .SYNCOPECVD  Do you have any other symptoms? Hot, Dizzy, and Threw up  Have you checked your HR and BP (record if available)? N/A   Patient had a friend drive her to our office. Spoke with RN in Triage, was told to inform the patient to have EMS pick her up and take her to the Emergency Room. Patient and patient's friend agreed to going to the Emergency Room.

## 2023-11-22 NOTE — ED Provider Notes (Signed)
 [facility] Provider Note    Event Date/Time   First MD Initiated Contact with Patient 11/22/23 1318     (approximate)   History   Dizziness   HPI  [name] is a 78 y.o. female  history of SVT, hyperlipidemia, asthma, depression, GAD, colon polyps, and ovarian cancer s/p resection and chemotherapy who underwent heart catheterization 2 days ago.   Patient reports that she was feeling fine after her procedure.  She started taking the new medication called Ranexa yesterday.  She took 1 dose and noticed she started felt lightheaded for the remainder of the day.  Woke up this morning felt okay, she took Ranexa again this morning.  Started feeling a little lightheaded and as she was driving her car felt extremely faint as though she was going to pass out.  She got nauseated and vomited once.  She called the helpline and they advised her to come to the ER.  She had no numbness or weakness.  Face did not droop her speech was okay.  She felt very lightheaded though as though she was going to faint.  The symptoms have gone away now she feels fine.  When she felt so lightheaded she got nauseated and vomited once.  No abdominal pain no fevers or chills.  No chest pain no shortness of breath.  The symptoms she experienced this morning did not seem to be the same as what was diagnosed by the cardiologist as angina  Reviewed recent phone triage notes patient has been having symptoms of feeling hot sweaty and dizzy.  Lightheadedness.  Friend drove her to the emergency room.  She did vomit.  Physical Exam   Triage Vital Signs: ED Triage Vitals  Encounter Vitals Group     BP 11/22/23 1125 (!) 141/65     Girls Systolic BP Percentile --      Girls Diastolic BP Percentile --      Boys Systolic BP Percentile --      Boys Diastolic BP Percentile --      Pulse Rate 11/22/23 1125 (!) 58     Resp 11/22/23 1125 16     Temp 11/22/23 1125 97.7 F (36.5 C)     Temp Source  11/22/23 1125 Oral     SpO2 11/22/23 1125 96 %     Weight 11/22/23 1126 180 lb (81.6 kg)     Height 11/22/23 1126 5' 4 (1.626 m)     Head Circumference --      Peak Flow --      Pain Score 11/22/23 1126 0     Pain Loc --      Pain Education --      Exclude from Growth Chart --     Most recent vital signs: Vitals:   11/22/23 1500 11/22/23 1523  BP: 130/68   Pulse: (!) 53   Resp: 16   Temp:  97.8 F (36.6 C)  SpO2: 97%      General: Awake, no distress.  Very pleasant fully oriented speech is clear facial expressions normal extraocular movements normal.  Normal strength all extremities. CV:  Good peripheral perfusion.  Normal tones and rate Resp:  Normal effort.  Clear bilateral Abd:  No distention.  Soft nontender nondistended through all areas Other:  Fully awake alert very pleasant.  Sister and family at bedside   ED Results / Procedures / Treatments   Labs (all labs ordered are listed, but only abnormal results are displayed)  Labs Reviewed  BASIC METABOLIC PANEL WITH GFR - Abnormal; Notable for the following components:      Result Value   Glucose, Bld 139 (*)    Creatinine, Ser 1.07 (*)    GFR, Estimated 53 (*)    All other components within normal limits  CBC - Abnormal; Notable for the following components:   WBC 15.9 (*)    All other components within normal limits  URINALYSIS, ROUTINE W REFLEX MICROSCOPIC - Abnormal; Notable for the following components:   Color, Urine AMBER (*)    APPearance HAZY (*)    All other components within normal limits  HEPATIC FUNCTION PANEL  MAGNESIUM  LIPASE, BLOOD  TROPONIN I (HIGH SENSITIVITY)  TROPONIN I (HIGH SENSITIVITY)   Her elevated white count seems to have some chronicity to it, with previous white count just over 15,000 as well 2 weeks ago   Patient relates that she does frequently have urinary tract infections, does not have any obvious symptoms but given her elevated white count and her last 2 checks we will  obtain urinalysis today.  Urinalysis without evidence of infection  Labs demonstrate initial troponin normal.  Repeat troponin normal  Leukocytosis white count of approximately 16,000.  Renal function preserved although with a slight increase in baseline creatinine and minimal reduction in GFR with normal electrolytes.  Hepatic function panel and lipase normal [significant lipase delay due to lab issues, but given she is asymptomatic at this time absolutely very much doubt that she would have acute pancreatitis]  EKG  Inter by me at 1130 heart rate 60 QRS 70 QTc 530 Normal sinus rhythm, mild nonspecific T wave abnormality.  May be slightly artifactual.  Will have nursing repeat    ECG repeated inter by me at 1340 heart rate 55 QRS 80 QTc somewhat hard to delineate as quite flat, but appears to be prolonged.  Element of a biphasic or almost U wave like appearance now appearing in V4.  Query prolongation of the QT interval.  The ECG does not appear frankly ischemic, but is nonspecific but concern for prolongation of the QT .  RADIOLOGY  Chest x-ray inter by me as normal negative for acute   DG Chest 2 View Result Date: 11/22/2023 EXAM: 2 VIEW(S) XRAY OF THE CHEST 11/22/2023 11:44:30 AM COMPARISON: Chest radiographs 11/07/2023 and earlier. CLINICAL HISTORY: 78 year old female. Onset of dizziness, nausea, and emesis. History of heart catheterization and new medication for palpitations. FINDINGS: LUNGS AND PLEURA: Mildly lower lung volumes. EKG button artifact in the upper lungs. No focal pulmonary opacity. No pulmonary edema. No pleural effusion. No pneumothorax. HEART AND MEDIASTINUM: No acute abnormality of the cardiac and mediastinal silhouettes. BONES AND SOFT TISSUES: Chronic lower thoracic spine hyperostosis with flowing endplate osteophytes. No acute osseous abnormality. IMPRESSION: 1. No acute cardiopulmonary abnormality. Electronically signed by: Helayne Hurst MD 11/22/2023 12:15 PM EDT RP  Workstation: [id number]      PROCEDURES:  Critical Care performed: No  Procedures   MEDICATIONS ORDERED IN ED: Medications  sodium chloride  0.9 % bolus 500 mL (0 mLs Intravenous Stopped 11/22/23 1523)     IMPRESSION / MDM / ASSESSMENT AND PLAN / ED COURSE  I reviewed the triage vital signs and the nursing notes.                              Differential diagnosis includes, but is not limited to, possible medication effect many of  her symptoms could be described as side effects of Ranexa including potential orthostatic symptoms, near syncope, nausea vomiting and now prolongation of the QT interval, and she had no chest pain her cardiac workup here thus far reassuring with regard to no evidence of ischemia.  Check troponin x 2.  Will check urinalysis no fevers fully awake alert.  No central neurologic features.  She is awake alert oriented but had a near syncopal episode with nausea and vomited once.  No abdominal pain reassuring abdominal exam.    Patient's presentation is most consistent with acute complicated illness / injury requiring diagnostic workup.   The patient is on the cardiac monitor to evaluate for evidence of arrhythmia and/or significant heart rate changes.   Clinical Course as of 11/22/23 1549  Thu Nov 22, 2023  1456 Troponin normal x 2, highly doubt ACS [MQ]  1525 Urinalysis no evidence infection.  With the patient not having any obvious infectious symptoms, clean urinalysis, clear chest x-ray and no frank symptoms of infection I suspect her elevated white count is unrelated.  Does also appear to been the case on routine outpatient labs done a couple weeks ago [MQ]    Clinical Course User Index [MQ] Dicky Anes, MD   Cardiology advising a recent diagnosis of unstable angina on September 30.  Discussed with Dr. Gollan, he reviewed the patient's recent clinical history cardiac catheterization and evaluation with his partner recently.  Advises that Ranexa is  likely the cause.  QT does appear prolonged, but advises this should abate quickly with discontinuation of Ranexa which is likely responsible for her symptoms.  Advises based on evaluation clinical history would be safe to discharge with plan to discontinue Ranexa from cardiac standpoint at this time.  Discussed with patient she will discontinue Ranexa.  She will continue to follow-up with cardiology.  Orthostatics, urinalysis completed.  Slight orthostatic drop in blood pressure but asymptomatic  ----------------------------------------- 3:49 PM on 11/22/2023 ----------------------------------------- Patient has now received hydration, resting comfortably feels well no further symptoms.  Patient requesting discharge.  Dr. Gollan aware, patient to follow-up with cardiology has stopped Ranexa  Recommended she not drive today, be cautious with getting up and down in case she has any lingering symptoms.  Return to the ER right away if symptoms return chest pain fatigue feels faint etc. or other concerns were  Return precautions and treatment recommendations and follow-up discussed with the patient who is agreeable with the plan.  Family and sister driving her home  FINAL CLINICAL IMPRESSION(S) / ED DIAGNOSES   Final diagnoses:  Near syncope  Lightheadedness     Rx / DC Orders   ED Discharge Orders          Ordered    Ambulatory referral to Cardiology       Comments: Near syncope, probable medication side effect   11/22/23 1433             Note:  This document was prepared using Dragon voice recognition software and may include unintentional dictation errors.   Dicky Anes, MD 11/22/23 [phone]

## 2023-11-22 NOTE — ED Triage Notes (Signed)
 Patient c/o onset of dizziness, nausea, and emesis. Had heart cath Tuesday and placed on new medicine for palpitations.

## 2023-11-22 NOTE — Telephone Encounter (Signed)
 Answered incoming call on the Triage phone.  When Rosina informed this RN of the symptoms of the patient, advised Rosina to inform the patient to call 911 or go directly to the Emergency Room.  Patient's symptoms included dizzy while driving, lightheaded, and vomiting.  The narrative according to Rosina is that the patient was driving, started feeling lightheaded and dizzy, she pulled over, called a friend to come and pick her up, and the friend then drove her to our parking lot and called to reach [place].  Patient also vomited in the parking lot or before arriving to the parking lot.

## 2023-11-22 NOTE — Telephone Encounter (Signed)
 Error

## 2023-11-22 NOTE — Discharge Instructions (Addendum)
 Discontinue use of Ranexa.  No driving today.  Return to the ER right away if you have symptoms that return including lightheadedness nausea vomiting or other symptoms that would be concerning such as chest pain difficulty speaking confusion or weakness.

## 2023-11-23 DIAGNOSIS — R002 Palpitations: Secondary | ICD-10-CM | POA: Diagnosis not present

## 2023-11-23 DIAGNOSIS — R0602 Shortness of breath: Secondary | ICD-10-CM | POA: Diagnosis not present

## 2023-11-26 DIAGNOSIS — R0789 Other chest pain: Secondary | ICD-10-CM

## 2023-11-26 DIAGNOSIS — I471 Supraventricular tachycardia, unspecified: Secondary | ICD-10-CM | POA: Diagnosis not present

## 2023-11-26 DIAGNOSIS — R002 Palpitations: Secondary | ICD-10-CM | POA: Diagnosis not present

## 2023-11-26 DIAGNOSIS — R0602 Shortness of breath: Secondary | ICD-10-CM

## 2023-11-26 DIAGNOSIS — R0609 Other forms of dyspnea: Secondary | ICD-10-CM

## 2023-12-06 ENCOUNTER — Ambulatory Visit: Payer: PPO | Admitting: Dermatology

## 2023-12-12 ENCOUNTER — Ambulatory Visit: Attending: Physician Assistant | Admitting: Physician Assistant

## 2023-12-12 ENCOUNTER — Encounter: Payer: Self-pay | Admitting: Physician Assistant

## 2023-12-12 VITALS — BP 110/58 | HR 69 | Ht 64.0 in | Wt 175.0 lb

## 2023-12-12 DIAGNOSIS — R0609 Other forms of dyspnea: Secondary | ICD-10-CM

## 2023-12-12 DIAGNOSIS — I25709 Atherosclerosis of coronary artery bypass graft(s), unspecified, with unspecified angina pectoris: Secondary | ICD-10-CM

## 2023-12-12 DIAGNOSIS — I471 Supraventricular tachycardia, unspecified: Secondary | ICD-10-CM | POA: Diagnosis not present

## 2023-12-12 DIAGNOSIS — E785 Hyperlipidemia, unspecified: Secondary | ICD-10-CM

## 2023-12-12 DIAGNOSIS — R0602 Shortness of breath: Secondary | ICD-10-CM

## 2023-12-12 MED ORDER — ATORVASTATIN CALCIUM 40 MG PO TABS: 40.0000 mg | ORAL_TABLET | Freq: Every day | ORAL | 3 refills | Status: AC

## 2023-12-12 MED ORDER — ISOSORBIDE MONONITRATE ER 30 MG PO TB24: 15.0000 mg | ORAL_TABLET | Freq: Every day | ORAL | 3 refills | Status: DC

## 2023-12-12 MED ORDER — ASPIRIN 81 MG PO TBEC: 81.0000 mg | DELAYED_RELEASE_TABLET | Freq: Every day | ORAL | Status: AC

## 2023-12-12 NOTE — Progress Notes (Signed)
 Cardiology Office Note    Date:  12/12/2023   ID:  [name], DOB Jan 12, 1946, MRN [id number]  PCP:  Leavy Mole, PA-C  Cardiologist:  Lonni Hanson, MD  Electrophysiologist:  None   Chief Complaint: Follow-up  History of Present Illness:   [name] is a 78 y.o. female with history of SVT, hyperlipidemia, asthma, depression, GAD, colonic polyps, and mild nonobstructive CAD who presents for follow-up after testing.    Patient was initially evaluated by Dr. Hanson as a new patient 02/2019 for palpitations in the setting of increased stress.  There is a remote isolated episode of syncope while donating blood many years prior to that.  She reported a stress test approximately 20 years ago as part of a CPE that she believed to be normal.  Zio patch 02/2019 showed predominant rhythm of sinus with average heart rate 64 bpm, rare PACs and PVCs.  There were 20 atrial runs lasting up to 12 seconds with a maximum heart rate of 184 bpm.  There was no sustained arrhythmias or prolonged pauses.  Patient triggered events corresponded to sinus rhythm.  Echo 04/2019 showed EF of 55 to 60%, no RWMA, and trivial MR.  She was seen in follow-up 04/2019 continuing to note episodic chest discomfort that was described as a pinch and was randomly occurring lasting for several seconds in duration with spontaneous resolution.  In this setting, patient underwent Lexiscan  MPI in 05/2019 which showed no significant ischemia with CT attenuation corrected images showing mild aortic atherosclerosis and no significant coronary artery calcification.  Overall, this was a low risk study.  Her tachypalpitations have been managed with as needed Lopressor  12.5 mg twice daily as needed.    She was seen in follow-up 01/2023 reporting shortness of breath and dyspnea on exertion which she attributed to a side effect of her chemotherapy for ovarian cancer.  Echo 01/2023 showed EF 60 to 65%, G1 DD, mild MR, and aortic sclerosis without  evidence of stenosis.  She was seen by her PCP 11/05/2023 reporting 6 months of worsening shortness of breath, dyspnea on exertion, and palpitations.  Labs were reassuring including TSH, BNP, potassium, and magnesium.  Patient was placed on long-term monitor which revealed predominantly sinus rhythm with 23 episodes of NSVT lasting up to 12 seconds with an average heart rate of 131 with otherwise no sustained arrhythmias and no evidence of atrial fibrillation.   Patient was most recently seen by myself 11/13/2023 reporting 6 months of progressive symptoms of exertional chest pain associated with palpitations and shortness of breath.  She reported chest pressure/heaviness with exertion for the past 6 months which initially only came on with heavier exertion or walking long distances but recently she had been experiencing with very little activity.  New T wave inversions were noted on EKG.  Left heart catheterization 11/20/2023 revealed mild nonobstructive CAD with 10 to 20% eccentric plaquing of the proximal LAD and otherwise no angiographically significant CAD.  Upper normal LVEDP 15 mmHg and normal LV systolic function.  She was started on ranolazine 500 mg twice daily for possible microvascular dysfunction.  She was evaluated in the emergency department 11/22/2023 for lightheadedness.  She reported starting newly prescribed Ranexa with lightheadedness for the remainder of the day.  She woke up the following morning and took her morning medications.  She was driving with her friend when she started to feel lightheaded and faint as though she may pass out.  She got nauseated and vomited once.  She called our office who recommended being evaluated in the emergency department.  In the ED, BP 141/65 and heart rate 58 bpm with otherwise normal vital signs.  WBC mildly elevated at 15.9 which is chronic for.  Creatinine 1.07 with otherwise reassuring labs.  Troponin negative x 2.  EKG without ischemic changes but there  was concern for QT prolongation.  Case was discussed with cardiology who recommended discontinuing Ranexa which was likely the cause for her symptoms and QT prolongation.  Patient presents today with ongoing symptoms of exertional chest pressure, shortness of breath, and palpitations.  We reviewed the results of her left heart catheterization and heart monitor.  She stays fairly active and reports symptoms typically onset when she initiates activity quickly or gets excited. She denies any further lightheadedness and nausea since discontinuing Ranexa. She denies bleeding. Cath site is stable.   Labs independently reviewed: 11/2023-Hgb 14.1, HCT 42.0, platelets 160, potassium 4.3, BUN 16, creatinine 1.07, sodium 139 04/2022-TC 182, HDL 48, TG 358, LDL 86  Objective   Past Medical History:  Diagnosis Date   Allergy    Arthritis    Asthma    Atypical chest pain 03/14/2019   Atypical mole 06/12/2018   right prox lat thigh/mod   Atypical mole 05/19/2014   left sup med buttock/excision   Atypical mole 03/26/2014   right sup lat buttock   Atypical mole 03/26/2013   left lat mid back   Atypical mole 11/08/2006   right shoulder superior/excision   Basal cell carcinoma 06/12/2016   right medial sup canthus below eyebrow   Basal cell carcinoma 09/23/2014   left nasal bridge   Basal cell carcinoma 03/26/2013   left nasal sidewall   Basal cell carcinoma 08/27/2012   left nasal dorsum   Cancer (HCC)    shoulder, nose skin CA- basal cell CA   GERD (gastroesophageal reflux disease)    occasional   Hyperlipidemia    Osteopenia    Polyp of transverse colon    Right arm weakness 03/14/2019   Stress due to illness of family member 05/29/2018    Current Medications: Current Meds  Medication Sig   acetaminophen (TYLENOL) 325 MG tablet Take 650 mg by mouth every 6 (six) hours as needed for moderate pain (pain score 4-6).   albuterol  (VENTOLIN  HFA) 108 (90 Base) MCG/ACT inhaler Inhale 2 puffs  into the lungs every 6 (six) hours as needed for wheezing or shortness of breath.   Alpha Lipoic Acid 200 MG CAPS Take 200 mg by mouth daily.   aspirin EC 81 MG tablet Take 1 tablet (81 mg total) by mouth daily. Swallow whole.   atorvastatin (LIPITOR) 40 MG tablet Take 1 tablet (40 mg total) by mouth daily.   escitalopram  (LEXAPRO ) 20 MG tablet Take 1 tablet (20 mg total) by mouth daily.   hydrOXYzine  (ATARAX ) 25 MG tablet Take 1 tablet (25 mg total) by mouth every 8 (eight) hours as needed for anxiety (insomnia).   isosorbide mononitrate (IMDUR) 30 MG 24 hr tablet Take 0.5 tablets (15 mg total) by mouth daily.   lovastatin  (MEVACOR ) 20 MG tablet TAKE 1 TABLET BY MOUTH EVERYDAY AT BEDTIME   melatonin 3 MG TABS tablet Take 3 mg by mouth at bedtime as needed (sleep).   metoprolol  succinate (TOPROL -XL) 25 MG 24 hr tablet TAKE 1 TABLET (25 MG TOTAL) BY MOUTH AS NEEDED (FOR PALPITATIONS).   vitamin B-12 (CYANOCOBALAMIN ) 1000 MCG tablet Take 1,000 mcg by mouth daily.   Vitamin  D, Cholecalciferol, 10 MCG (400 UNIT) CAPS Take 400 Units by mouth daily.    Allergies:   Tetanus toxoid-containing vaccines, Ranexa [ranolazine], and Sulfa antibiotics   Social History   Socioeconomic History   Marital status: Married    Spouse name: [name]   Number of children: 2   Years of education: Not on file   Highest education level: Bachelor's degree (e.g., BA, AB, BS)  Occupational History   Occupation: Retired  Tobacco Use   Smoking status: Former    Current packs/day: 0.00    Average packs/day: 1.5 packs/day for 20.0 years (30.0 ttl pk-yrs)    Types: Cigarettes    Start date: 79    Quit date: 1993    Years since quitting: 32.8    Passive exposure: Past   Smokeless tobacco: Never  Vaping Use   Vaping status: Never Used  Substance and Sexual Activity   Alcohol use: Yes    Alcohol/week: 1.0 standard drink of alcohol    Types: 1 Glasses of wine per week    Comment: one glass of wine once a month    Drug use: Never   Sexual activity: Not Currently  Other Topics Concern   Not on file  Social History Narrative   Lives with husband in [place]; quit smoking > 30 years; once every 1-2 months; retd. Teacher- elementary school;    Social Drivers of Health   Financial Resource Strain: Low Risk  (05/09/2022)   Overall Financial Resource Strain (CARDIA)    Difficulty of Paying Living Expenses: Not hard at all  Food Insecurity: No Food Insecurity (05/09/2022)   Hunger Vital Sign    Worried About Running Out of Food in the Last Year: Never true    Ran Out of Food in the Last Year: Never true  Transportation Needs: No Transportation Needs (05/09/2022)   PRAPARE - Administrator, Civil Service (Medical): No    Lack of Transportation (Non-Medical): No  Physical Activity: Insufficiently Active (05/09/2022)   Exercise Vital Sign    Days of Exercise per Week: 3 days    Minutes of Exercise per Session: 20 min  Stress: Stress Concern Present (05/09/2022)   [employer] of Occupational Health - Occupational Stress Questionnaire    Feeling of Stress : To some extent  Social Connections: Socially Integrated (05/09/2022)   Social Connection and Isolation Panel    Frequency of Communication with Friends and Family: Three times a week    Frequency of Social Gatherings with Friends and Family: Three times a week    Attends Religious Services: More than 4 times per year    Active Member of Clubs or Organizations: Yes    Attends Engineer, Structural: More than 4 times per year    Marital Status: Married     Family History:  The patient's family history includes Alcohol abuse in her sister; Alzheimer's disease in her father; Colon cancer in her paternal uncle; Diabetes in her father; Down syndrome in her sister; Heart attack (age of onset: 84) in her father; Heart disease in her sister; Liver cancer (age of onset: 24) in her son; Lung cancer in her mother and paternal grandmother;  Lung cancer (age of onset: 84) in her sister; Melanoma in her maternal grandmother; Parkinson's disease in her father; Stomach cancer in her paternal grandfather; Stroke in her paternal grandmother. There is no history of Esophageal cancer or Breast cancer.  ROS:   12-point review of systems is negative unless otherwise  noted in the HPI.  EKGs/Other Studies Reviewed:    Studies reviewed were summarized above. The additional studies were reviewed today:  11/2023 Long term monitor 23 nonsustained SVT, longest 12 seconds with an average rate of 131 bpm. Rare supraventricular and ventricular ectopy. No sustained arrhythmias. No atrial fibrillation.  11/2023 LHC Mild, non-obstructive coronary artery disease with 10-20% eccentric plaquing of the proximal LAD.  Otherwise, no angiographically significant coronary artery disease. Upper normal left ventricular filling pressure (LVEDP 15 mmHg). Normal left ventricular systolic function (LVEF 55-65%)  EKG:  EKG personally reviewed by me today EKG Interpretation Date/Time:  Wednesday December 12 2023 09:17:25 EDT Ventricular Rate:  69 PR Interval:  160 QRS Duration:  72 QT Interval:  378 QTC Calculation: 405 R Axis:   17  Text Interpretation: Normal sinus rhythm Nonspecific ST and T wave abnormality When compared with ECG of 22-Nov-2023 13:35, PREVIOUS ECG IS PRESENT Confirmed by Lorene Sinclair ([id number]) on 12/12/2023 9:18:06 AM  PHYSICAL EXAM:    VS:  BP (!) 110/58 (BP Location: Left Arm, Patient Position: Sitting, Cuff Size: Normal)   Pulse 69   Ht 5' 4 (1.626 m)   Wt 175 lb (79.4 kg)   LMP  (LMP Unknown)   SpO2 95%   BMI 30.04 kg/m   BMI: Body mass index is 30.04 kg/m.  GEN: Well nourished, well developed in no acute distress NECK: No JVD; No carotid bruits CARDIAC: RRR, no murmurs, rubs, gallops RESPIRATORY:  Clear to auscultation without rales, wheezing or rhonchi  ABDOMEN: Soft, non-tender, non-distended EXTREMITIES: No edema;  No deformity  Wt Readings from Last 3 Encounters:  12/12/23 175 lb (79.4 kg)  11/22/23 180 lb (81.6 kg)  11/20/23 182 lb 12.8 oz (82.9 kg)                  ASSESSMENT & PLAN:   Nonobstructive CAD - Echo 01/2023 with normal LV systolic function. LHC 11/20/2023 showed mild nonobstructive CAD with 10 to 20% eccentric plaquing of the proximal LAD with otherwise no angiographically significant CAD.  She was started on Ranexa for possible microvascular dysfunction although she did not tolerate this secondary to lightheadedness, nausea, and QT prolongation.  EKG today shows improvement in QT segment to normal range.  She continues to experience exertional chest pressure and dyspnea with occasional palpitations.  Recommend low-dose Imdur 15 mg daily.  Will resume aspirin 81 mg daily and transition to high intensity statin therapy with atorvastatin 40 mg daily.  Dyspnea on exertion - Given ongoing symptoms and minimal CAD on LHC.  Recommend referral to pulmonology for further evaluation of dyspnea on exertion.  PSVT - ZIO monitor showed 23 episodes of NSVT lasting up to 12 seconds.  She continues to have intermittent palpitations.  She is continued on metoprolol  succinate 25 mg as needed for palpitations.  Hyperlipidemia - Most recent lipid panel 04/2022 with LDL 86, goal < 70.  Discontinue lovastatin  and transition to high intensity atorvastatin 40 mg daily.  Anticipate repeat lipid panel at follow-up.   Disposition: Discontinue lovastatin .  Start aspirin 81 mg daily, Imdur 15 mg daily, and atorvastatin 40 mg daily.  Referred to pulmonology for evaluation of DOE.  F/u with Dr. Mady or an APP in 2 months.   Medication Adjustments/Labs and Tests Ordered: Current medicines are reviewed at length with the patient today.  Concerns regarding medicines are outlined above. Medication changes, Labs and Tests ordered today are summarized above and listed in the Patient Instructions accessible in  Encounters.   SignedLesley Maffucci, PA-C 12/12/2023 9:55 AM     [facility] [place] [address] [place], [place] [address] [phone]

## 2023-12-12 NOTE — Patient Instructions (Addendum)
 Medication Instructions:  Your physician recommends the following medication changes.  START TAKING: Aspirin 81 mg daily (chewable or Enteric Coated is ok) Atorvastatin 40 mg daily Imdur 15 mg daily  *If you need a refill on your cardiac medications before your next appointment, please call your pharmacy*  Lab Work: None ordered at this time   Follow-Up: At [facility], you and your health needs are our priority.  As part of our continuing mission to provide you with exceptional heart care, our providers are all part of one team.  This team includes your primary Cardiologist (physician) and Advanced Practice Providers or APPs (Physician Assistants and Nurse Practitioners) who all work together to provide you with the care you need, when you need it.  Your next appointment:   2 month(s)  Provider:   You may see Lonni Hanson, MD or one of the following Advanced Practice Providers on your designated Care Team:   Lonni Meager, NP Lesley Maffucci, PA-C Bernardino Bring, PA-C Cadence [place], PA-C Tylene Lunch, NP Barnie Hila, NP

## 2023-12-21 ENCOUNTER — Ambulatory Visit

## 2023-12-26 ENCOUNTER — Encounter: Payer: Self-pay | Admitting: Obstetrics and Gynecology

## 2023-12-26 ENCOUNTER — Inpatient Hospital Stay: Attending: Obstetrics and Gynecology

## 2023-12-26 ENCOUNTER — Inpatient Hospital Stay (HOSPITAL_BASED_OUTPATIENT_CLINIC_OR_DEPARTMENT_OTHER): Admitting: Obstetrics and Gynecology

## 2023-12-26 VITALS — BP 128/62 | HR 62 | Temp 97.6°F | Resp 18

## 2023-12-26 DIAGNOSIS — Z08 Encounter for follow-up examination after completed treatment for malignant neoplasm: Secondary | ICD-10-CM | POA: Insufficient documentation

## 2023-12-26 DIAGNOSIS — Z90710 Acquired absence of both cervix and uterus: Secondary | ICD-10-CM | POA: Diagnosis not present

## 2023-12-26 DIAGNOSIS — Z8543 Personal history of malignant neoplasm of ovary: Secondary | ICD-10-CM

## 2023-12-26 DIAGNOSIS — Z9079 Acquired absence of other genital organ(s): Secondary | ICD-10-CM | POA: Insufficient documentation

## 2023-12-26 DIAGNOSIS — Z90722 Acquired absence of ovaries, bilateral: Secondary | ICD-10-CM | POA: Diagnosis not present

## 2023-12-26 DIAGNOSIS — C562 Malignant neoplasm of left ovary: Secondary | ICD-10-CM

## 2023-12-26 DIAGNOSIS — Z9221 Personal history of antineoplastic chemotherapy: Secondary | ICD-10-CM | POA: Diagnosis not present

## 2023-12-26 NOTE — Progress Notes (Signed)
 Gynecologic Oncology Interval Visit   Referring Provider: Dr. 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 female s/p prior hysterectomy (ovaries in situ) who is seen in consultation from Dr. MacDiarmid for ovarian mass. S/p ex-lap, BSO, biopsies, appendectomy, adhesiolysis with Dr. Elby at [facility] on 09/08/21. She is s/p 6 of 6 planned cycles of adjuvant chemotherapy with carboplatin  and paclitaxel , completed 01/30/22. NED since. She returns to clinic for continued surveillance.   Physically she feels well and denies pelvic pain, bleeding and/or other symptoms. Her neuropathy is managed by Dr. Maree. No interval imaging or hospitalizations.   CA125 09/26/23 8.2  Component Ref Range & Units (hover) 3 mo ago (02/21/23) 6 mo ago (11/15/22) 10 mo ago (07/19/22) 1 yr ago (05/03/22) 1 yr ago (01/30/22) 1 yr ago (01/09/22) 1 yr ago (12/15/21)  Cancer Antigen (CA) 125 6.6 7.0 CM 7.2 CM 7.8 CM 10.6 CM 10.3 CM 11.5 CM   Gynecologic Oncologic History [name] is a pleasant female s/p prior hysterectomy (ovaries in situ) who is seen in consultation from Dr. MacDiarmid for ovarian mass.   Patient presented to Urology for incontinence. She had elevated residual urine and ultrasound was performed which showed 12.2 x 10.1 x 2.9 cm mixed cystic and solid mass suspicious for ovarian mass.   MRI 08/04/21- Reproductive: Large cystic and solid pelvic mass centered in the pelvis. Ovarian tissue or ovary is not demonstrated on the current study. Mass in total measuring 15 x 11 x 12 cm. Soft tissue component showing restricted diffusion and enhancement centered in the lower portion of the mass shows heterogeneous T2 signal and measures 10.8 x 6.8 x 8.5 cm amidst cystic areas. There is a small amount of associated ascites. The mass abuts the vaginal apex following hysterectomy, potentially involving the LEFT vaginal apex. The soft tissue  component displays very heterogeneous enhancement and there are numerous enhancing septations throughout. Collateral pathways from LEFT ovarian vein are noted about the LEFT lateral aspect of the mass. Given that the gonadal vein can be traced to the mass suspicion is for mass of LEFT ovarian origin though this may of parasitized flow from ovarian. Vessels on the LEFT   Other: Trace ascites. No discrete peritoneal nodularity about the pelvis.   Musculoskeletal: No suspicious bone lesion   IMPRESSION: 1. Large cystic and solid pelvic mass highly suspicious for neoplasm. Tumor may arise from the LEFT ovary based on proximity to LEFT ovarian vein though no defined normal ovarian tissue can be seen on either the RIGHT or the LEFT on today's exam. Correlate with surgical history. Sarcoma is also considered based on the marked heterogeneity seen within the lesion on the current study. There is strong restricted diffusion within and avid enhancement of the large central soft tissue component. Gyn consultation gyn consultation is suggested. 2. Trace ascites. 3. Tumor in close proximity to sigmoid colon without definitive signs of involvement also in close proximity to the urinary bladder with defined plane between urinary bladder and mass. Mass may involve the LEFT vaginal fornix. 4. Small amount of associated ascites.  She has history of hysterectomy at age 55 for endometriosis.   We recommended surgical management.  CA 125 was 526 at diagnosis  09/08/2021  Diagnostic laparoscopy, exploratory laparotomy, bilateral salpingo-oophorectomy, infracolic omentectomy, peritoneal biopsies, appendectomy with partial cecectomy (due to frozen section suggesting mucinous ovarian tumor and appendix was adherent to cecum) adhesiolysis (including enterolysis and ovariolysis),  and posterior rectus sheath blocks  with Dr Elby at [facility]. Mass was decompressed with controlled drainage due to large size and extensive  adhesions.  She did well post operatively.   Pathology:  A. 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 Negative for surface involvement. Bilateral fallopian tubes: Negative for carcinoma.   See synoptic report.   Comment: The tumor has an unusual microcystic morphology, but immunohistochemical studies are most consistent with a high-grade serous carcinoma.   B.  Right ovary, oophorectomy: Right ovary with no pathologic diagnosis.  Negative for carcinoma.   C. Omentum, omentectomy: Benign fibroadipose tissue. Negative for carcinoma.   D. Left paracolic gutter, biopsy: Benign fibroadipose tissue and skeletal muscle. Negative for carcinoma.   E. Left pelvic, biopsy: Benign fibroadipose tissue and smooth muscle. Negative for carcinoma.   F. Anterior cul de sac, biopsy: Benign fibroadipose tissue and smooth muscle. Negative for carcinoma.   G. Right pelvic, biopsy: Benign fibrous tissue and smooth muscle. Negative for carcinoma.   H. Posterior cul de sac, biopsy: Benign fibrous tissue. Negative for carcinoma.   I. Appendix and portion of cecum, appendectomy and partial cecectomy: Appendix and portion of cecum with no pathologic diagnosis. Negative for carcinoma.  Pelvic washings - negative for malignancy  8//31/2023-01/30/2022 : Carboplatin  (AUC 6) + Paclitaxel  (175) q21d X 6 Cycles  04/26/2022 CT C/A/P IMPRESSION: 1. No definitive evidence of metastatic disease. 2. Minimal focal vague haziness in the ventral left lower quadrant, status post omentectomy. No discrete nodules. Recommend attention on follow-up. 3.  Aortic atherosclerosis (ICD10-I70.0). 4.  Emphysema (ICD10-J43.9).  Genetics Assessment: Negative genetic testing. No pathogenic variants identified on the [facility] CancerNext-Expanded+RNA panel. The report date is 10/31/2021. HRD testing  through [facility] was also negative and reported out 10/31/2021.   Problem List: Patient Active Problem List   Diagnosis Date Noted   Unstable angina (HCC) 11/20/2023   History of nonmelanoma skin cancer 10/09/2023   Fatigue 11/28/2022   Genetic testing 11/02/2021   Ovarian cancer on left (HCC) 09/28/2021   Depression, unspecified 09/06/2021   Mild intermittent asthma 09/06/2021   Ovarian mass 08/10/2021   PSVT (paroxysmal supraventricular tachycardia) 08/14/2019   Vitamin D  deficiency 06/10/2019   Current mild episode of major depressive disorder without prior episode 10/15/2018   GAD (generalized anxiety disorder) 10/15/2018   History of colonic polyps    Obesity (BMI 30.0-34.9) 05/29/2018   GERD (gastroesophageal reflux disease) 11/27/2017   Osteopenia 11/27/2017   Mixed hyperlipidemia 12/09/2014   Insomnia 12/09/2014   Past Medical History: Past Medical History:  Diagnosis Date   Allergy    Arthritis    Asthma    Atypical chest pain 03/14/2019   Atypical mole 06/12/2018   right prox lat thigh/mod   Atypical mole 05/19/2014   left sup med buttock/excision   Atypical mole 03/26/2014   right sup lat buttock   Atypical mole 03/26/2013   left lat mid back   Atypical mole 11/08/2006   right shoulder superior/excision   Basal cell carcinoma 06/12/2016   right medial sup canthus below eyebrow   Basal cell carcinoma 09/23/2014   left nasal bridge   Basal cell carcinoma 03/26/2013   left nasal sidewall   Basal cell carcinoma 08/27/2012   left nasal dorsum   Cancer (HCC)    shoulder, nose skin CA- basal cell CA   GERD (gastroesophageal reflux disease)    occasional  Hyperlipidemia    Osteopenia    Polyp of transverse colon    Right arm weakness 03/14/2019   Stress due to illness of family member 05/29/2018   Past Surgical History: Past Surgical History:  Procedure Laterality Date   BILATERAL SALPINGO-OOPHORECTOMY AND OMENTECTOMY; WITH RADICAL DISSECTION FOR  DEBULKING  09/08/2021   BREAST BIOPSY Right    core bx- neg   COLONOSCOPY     COLONOSCOPY WITH PROPOFOL  N/A 10/08/2018   Procedure: COLONOSCOPY WITH PROPOFOL ;  Surgeon: Jinny Carmine, MD;  Location: [facility] ENDOSCOPY;  Service: Endoscopy;  Laterality: N/A;   LEFT HEART CATH AND CORONARY ANGIOGRAPHY Left 11/20/2023   Procedure: LEFT HEART CATH AND CORONARY ANGIOGRAPHY;  Surgeon: Mady Bruckner, MD;  Location: [facility] INVASIVE CV LAB;  Service: Cardiovascular;  Laterality: Left;   MOHS SURGERY     TONSILLECTOMY     VAGINAL HYSTERECTOMY     Past Gynecologic History:  Menarche: age 27 Hysterectomy at age 54.   OB History:  OB History  No obstetric history on file.   Family History: Family History  Problem Relation Age of Onset   Lung cancer Mother    Diabetes Father    Parkinson's disease Father    Alzheimer's disease Father    Heart attack Father 94   Alcohol abuse Sister    Heart disease Sister    Lung cancer Sister 44   Down syndrome Sister    Colon cancer Paternal Uncle        d. 38s   Melanoma Maternal Grandmother    Stroke Paternal Grandmother    Lung cancer Paternal Grandmother        d. 34s   Stomach cancer Paternal Grandfather        d. 70s   Liver cancer Son 46   Esophageal cancer Neg Hx    Breast cancer Neg Hx    Social History: Social History   Socioeconomic History   Marital status: Married    Spouse name: [name]   Number of children: 2   Years of education: Not on file   Highest education level: Bachelor's degree (e.g., BA, AB, BS)  Occupational History   Occupation: Retired  Tobacco Use   Smoking status: Former    Current packs/day: 0.00    Average packs/day: 1.5 packs/day for 20.0 years (30.0 ttl pk-yrs)    Types: Cigarettes    Start date: 42    Quit date: 1993    Years since quitting: 32.8    Passive exposure: Past   Smokeless tobacco: Never  Vaping Use   Vaping status: Never Used  Substance and Sexual Activity   Alcohol use: Yes     Alcohol/week: 1.0 standard drink of alcohol    Types: 1 Glasses of wine per week    Comment: one glass of wine once a month   Drug use: Never   Sexual activity: Not Currently  Other Topics Concern   Not on file  Social History Narrative   Lives with husband in [place]; quit smoking > 30 years; once every 1-2 months; retd. Teacher- elementary school;    Social Drivers of Health   Financial Resource Strain: Low Risk  (05/09/2022)   Overall Financial Resource Strain (CARDIA)    Difficulty of Paying Living Expenses: Not hard at all  Food Insecurity: No Food Insecurity (05/09/2022)   Hunger Vital Sign    Worried About Running Out of Food in the Last Year: Never true    Ran Out of Food in the  Last Year: Never true  Transportation Needs: No Transportation Needs (05/09/2022)   PRAPARE - Administrator, Civil Service (Medical): No    Lack of Transportation (Non-Medical): No  Physical Activity: Insufficiently Active (05/09/2022)   Exercise Vital Sign    Days of Exercise per Week: 3 days    Minutes of Exercise per Session: 20 min  Stress: Stress Concern Present (05/09/2022)   [employer] of Occupational Health - Occupational Stress Questionnaire    Feeling of Stress : To some extent  Social Connections: Socially Integrated (05/09/2022)   Social Connection and Isolation Panel    Frequency of Communication with Friends and Family: Three times a week    Frequency of Social Gatherings with Friends and Family: Three times a week    Attends Religious Services: More than 4 times per year    Active Member of Clubs or Organizations: Yes    Attends Banker Meetings: More than 4 times per year    Marital Status: Married  Catering Manager Violence: Not At Risk (03/31/2022)   Humiliation, Afraid, Rape, and Kick questionnaire    Fear of Current or Ex-Partner: No    Emotionally Abused: No    Physically Abused: No    Sexually Abused: No   Allergies: Allergies  Allergen  Reactions   Tetanus Toxoid-Containing Vaccines Shortness Of Breath   Ranexa [Ranolazine] Other (See Comments)    Probable orthostatic symptoms.  Near syncope   Sulfa Antibiotics Nausea And Vomiting    Severe vomiting   Current Medications: Current Outpatient Medications  Medication Sig Dispense Refill   acetaminophen (TYLENOL) 325 MG tablet Take 650 mg by mouth every 6 (six) hours as needed for moderate pain (pain score 4-6).     albuterol  (VENTOLIN  HFA) 108 (90 Base) MCG/ACT inhaler Inhale 2 puffs into the lungs every 6 (six) hours as needed for wheezing or shortness of breath. 8 g 0   Alpha Lipoic Acid 200 MG CAPS Take 200 mg by mouth daily.     aspirin EC 81 MG tablet Take 1 tablet (81 mg total) by mouth daily. Swallow whole.     atorvastatin (LIPITOR) 40 MG tablet Take 1 tablet (40 mg total) by mouth daily. 90 tablet 3   escitalopram  (LEXAPRO ) 20 MG tablet Take 1 tablet (20 mg total) by mouth daily. 90 tablet 1   hydrOXYzine  (ATARAX ) 25 MG tablet Take 1 tablet (25 mg total) by mouth every 8 (eight) hours as needed for anxiety (insomnia). 90 tablet 1   isosorbide mononitrate (IMDUR) 30 MG 24 hr tablet Take 0.5 tablets (15 mg total) by mouth daily. 45 tablet 3   melatonin 3 MG TABS tablet Take 3 mg by mouth at bedtime as needed (sleep).     metoprolol  succinate (TOPROL -XL) 25 MG 24 hr tablet TAKE 1 TABLET (25 MG TOTAL) BY MOUTH AS NEEDED (FOR PALPITATIONS). 90 tablet 3   vitamin B-12 (CYANOCOBALAMIN ) 1000 MCG tablet Take 1,000 mcg by mouth daily.     Vitamin D , Cholecalciferol, 10 MCG (400 UNIT) CAPS Take 400 Units by mouth daily.     No current facility-administered medications for this visit.    Review of Systems General:  fatigue & stress Skin: no complaints Eyes: no complaints HEENT: no complaints Breasts: no complaints Pulmonary: shortness of breath- stable Cardiac: no complaints Gastrointestinal: no complaints Genitourinary/Sexual: no complaints Ob/Gyn: no  complaints Musculoskeletal: no complaints Hematology: no complaints Neurologic/Psych: no complaints  Objective:  Physical Examination:  BP 128/62  Pulse 62   Temp 97.6 F (36.4 C)   Resp 18   LMP  (LMP Unknown)   SpO2 97%     ECOG Performance Status: 0 - Asymptomatic  GENERAL: Patient is a well appearing female in no acute distress HEENT:  Sclera clear. Anicteric NODES:  Negative axillary, supraclavicular, inguinal lymph node survery LUNGS:  Clear to auscultation bilaterally.   HEART:  Regular rate and rhythm.  ABDOMEN:  Soft, nontender.  No hernias, incisions well healed. No masses or ascites EXTREMITIES:  No peripheral edema. Atraumatic. No cyanosis SKIN:  Clear with no obvious rashes or skin changes.  NEURO:  Nonfocal. Well oriented.  Appropriate affect.  Pelvic: Exam chaperoned by CMA EGBUS: no lesions.  Vagina: no bleeding, discharge, or lesions.   Cervix: surgically absent Uterus: surgically absent BME: no palpable masses Rectovaginal: deferred   Lab Review:  CA 125 per HPI. Today's result pending.   Radiologic Imaging: No imaging on site.    Assessment:  [name] is a 78 y.o. female with complex 15 cm pelvic mass found to have stage Ic high grade serous cancer of the ovary.  In 7/23 had diagnostic laparoscopy, ex lap, BSO, infracolic omentectomy, peritoneal biopsies, LOA, appendectomy with partial cecectomy (due to frozen section suggesting mucinous ovarian tumor and appendix was adherent to cecum). She had prior hysterectomy. Pathology consistent with stage IC high grade serous ovarian cancer of left ovary based on controlled drainage of the mass.  She received 6 cycles of adjuvant carboplatin -paclitaxel . CA 125 was 526 at diagnosis. CT 04/2022 no definitive recurrence. CA 125 was down to 7 in October 2024. Asymptomatic. CA 125 pending. Clinically, NED today.   Germline panel testing and tumor HRD testing negative.   Medical co-morbidities complicating  care: prior vaginal hysterectomy; asthma, and prior skin cancers Plan:   Problem List Items Addressed This Visit       Endocrine   Ovarian cancer on left [facility]) - Primary   Return to clinic in 4 months for continued surveillance with pelvic exam and labs to monitor CA125.  We again reviewed symptoms that would be concerning for recurrent disease.  She will follow up with her primary care doctor for ongoing wellness, management of other medical comorbidities, and cancer screenings.   The patient's diagnosis, an outline of the further diagnostic and laboratory studies which will be required, the recommendation for surgery, and alternatives were discussed with her and her accompanying family members.  All questions were answered to their satisfaction.  Prentice Agent, MD

## 2023-12-27 ENCOUNTER — Other Ambulatory Visit: Payer: Self-pay

## 2023-12-27 LAB — CA 125: Cancer Antigen (CA) 125: 6.6 U/mL (ref 0.0–38.1)

## 2024-01-08 ENCOUNTER — Encounter: Payer: Self-pay | Admitting: Student in an Organized Health Care Education/Training Program

## 2024-01-08 ENCOUNTER — Ambulatory Visit: Admitting: Student in an Organized Health Care Education/Training Program

## 2024-01-08 VITALS — BP 116/62 | HR 52 | Temp 98.7°F | Ht 64.0 in | Wt 175.0 lb

## 2024-01-08 DIAGNOSIS — R0602 Shortness of breath: Secondary | ICD-10-CM | POA: Diagnosis not present

## 2024-01-08 LAB — NITRIC OXIDE: Nitric Oxide: 20

## 2024-01-08 NOTE — Patient Instructions (Signed)
  VISIT SUMMARY: Today, you were seen for your shortness of breath, which has been ongoing for about a year and worsened after your chemotherapy for ovarian cancer. We discussed your symptoms, including when they occur and what might be causing them. We also reviewed your history of asthma, ovarian cancer, and recent heart issues.  YOUR PLAN: -DYSPNEA ON EXERTION: Dyspnea on exertion means experiencing shortness of breath during physical activities. We are considering several potential causes, including lung issues and possible blood clots in the lungs due to your cancer history. We have ordered a pulmonary function test, a ventilation-perfusion scan, and a chest X-ray to help determine the cause. We will coordinate the scheduling of these tests and follow up with you once we have the results.  -HISTORY OF HIGH GRADE SEROUS OVARIAN CANCER: You have a history of stage one high grade serous ovarian cancer, which was treated with surgery and chemotherapy. You are currently under surveillance with no signs of the cancer returning. We are considering the side effects of chemotherapy as a possible reason for your shortness of breath.  -GASTROESOPHAGEAL REFLUX DISEASE (GERD): GERD is a condition where stomach acid frequently flows back into the tube connecting your mouth and stomach, causing heartburn and other symptoms. You are managing this with Prilosec as needed.  INSTRUCTIONS: Please complete the pulmonary function test, ventilation-perfusion scan, and chest X-ray as scheduled. We will follow up with you to discuss the results and next steps. If you experience any new or worsening symptoms, please contact our office immediately.        Contains text generated by [employer].

## 2024-01-08 NOTE — Progress Notes (Signed)
 Assessment & Plan:   Assessment & Plan  #Dyspnea on exertion  Presents for the evaluation of dyspnea for one year, primarily with exertion. Cardiac workup showed mild non-obstructive coronary artery disease. She has a significant smoking history (around 35-40 pack years) as well as childhood asthma (with peak eosinophil of 500 but no classic asthma symptoms).  Differential includes obstructive lung disease (COPD vs Asthma), CTEPH (given history of cancer) and toxicity secondary to chemotherapy. Will evaluate with PFT's and a VQ scan. Reviewed chest CT performed previously with only findings of mild emphysema but no sign of ILD.  - Ordered pulmonary function test. - Ordered ventilation-perfusion scan. - Scheduled chest X-ray with ventilation-perfusion scan. - Pulmonary Function Test; Future - NM Pulmonary Perfusion; Future - DG Chest 2 View; Future  #History of high grade serous ovarian cancer, status post resection and chemotherapy, currently on surveillance  Stage one high grade serous ovarian cancer, post resection and chemotherapy. Currently on surveillance with no symptoms of recurrence. Chemotherapy side effects considered as a potential cause of dyspnea.    Return in about 2 months (around 03/09/2024).  Belva November, MD [facility]  I spent 60 minutes caring for this patient today, including preparing to see the patient, obtaining a medical history , reviewing a separately obtained history, performing a medically appropriate examination and/or evaluation, counseling and educating the patient/family/caregiver, ordering medications, tests, or procedures, documenting clinical information in the electronic health record, and independently interpreting results (not separately reported/billed) and communicating results to the patient/family/caregiver  End of visit medications:  No orders of the defined types were placed in this encounter.    Current  Outpatient Medications:    acetaminophen  (TYLENOL ) 325 MG tablet, Take 650 mg by mouth every 6 (six) hours as needed for moderate pain (pain score 4-6)., Disp: , Rfl:    albuterol  (VENTOLIN  HFA) 108 (90 Base) MCG/ACT inhaler, Inhale 2 puffs into the lungs every 6 (six) hours as needed for wheezing or shortness of breath., Disp: 8 g, Rfl: 0   Alpha Lipoic Acid 200 MG CAPS, Take 200 mg by mouth daily., Disp: , Rfl:    aspirin  EC 81 MG tablet, Take 1 tablet (81 mg total) by mouth daily. Swallow whole., Disp: , Rfl:    atorvastatin  (LIPITOR) 40 MG tablet, Take 1 tablet (40 mg total) by mouth daily., Disp: 90 tablet, Rfl: 3   escitalopram  (LEXAPRO ) 20 MG tablet, Take 1 tablet (20 mg total) by mouth daily., Disp: 90 tablet, Rfl: 1   hydrOXYzine  (ATARAX ) 25 MG tablet, Take 1 tablet (25 mg total) by mouth every 8 (eight) hours as needed for anxiety (insomnia)., Disp: 90 tablet, Rfl: 1   melatonin 3 MG TABS tablet, Take 3 mg by mouth at bedtime as needed (sleep)., Disp: , Rfl:    metoprolol  succinate (TOPROL -XL) 25 MG 24 hr tablet, TAKE 1 TABLET (25 MG TOTAL) BY MOUTH AS NEEDED (FOR PALPITATIONS)., Disp: 90 tablet, Rfl: 3   traZODone  (DESYREL ) 50 MG tablet, Take 100 mg by mouth at bedtime as needed., Disp: , Rfl:    vitamin B-12 (CYANOCOBALAMIN ) 1000 MCG tablet, Take 1,000 mcg by mouth daily., Disp: , Rfl:    Vitamin D , Cholecalciferol, 10 MCG (400 UNIT) CAPS, Take 400 Units by mouth daily., Disp: , Rfl:    isosorbide  mononitrate (IMDUR ) 30 MG 24 hr tablet, Take 0.5 tablets (15 mg total) by mouth daily. (Patient not taking: Reported on 01/08/2024), Disp: 45 tablet, Rfl: 3   Subjective:  PATIENT ID: [name] Lesches GENDER: female DOB: 07/30/1945, MRN: [id number]  Chief Complaint  Patient presents with   Shortness of Breath    Has been diagnosed with Asthma. DOE. No wheezing or cough.  Albuterol - PRN    HPI  Discussed the use of AI scribe software for clinical note transcription with the patient, who  gave verbal consent to proceed.  History of Present Illness  [name] is a 78 year old female with asthma and ovarian cancer who presents with shortness of breath. She was referred by cardiology for evaluation of her shortness of breath.  She has been experiencing shortness of breath primarily with exertion for approximately one year, which worsened following chemotherapy for ovarian cancer. She becomes easily tired and short of breath during significant exertion, such as walking her dog or playing ball, but not during mild activities like walking to the mailbox. She uses a stair lift at home due to a broken leg and other problems, but alternates between using the stairs and the lift. No cough, wheezing, or phlegm production. She experiences chest pain described as a 'pressure pushing' sensation that occurs both at rest and with exertion, particularly when stressed. No worsening of breathing when bending over or lying on her back.  She has a history of asthma, which she outgrew in her late teens. She used to receive allergy shots and used an inhaler with epinephrine. She occasionally uses albuterol  when she has a cold, which helps alleviate her symptoms. However, her current symptoms are different from her past asthma experiences.  She has a history of stage one high-grade serous ovarian cancer, status post resection in 2023, followed by six cycles of adjuvant carboplatin  and paclitaxel . She is currently on surveillance for her cancer.  She underwent a left heart catheterization in October 2025. An echocardiogram was performed in 2024. She reports a history of a heart attack last month and was started on two medications, one of which caused a reaction leading to an ER visit. She experiences palpitations but has not been given a specific cause for them.  She has a history of smoking, having quit 32 years ago. She smoked from age 24, with a break during pregnancy, and resumed until quitting at age  75. She smoked up to two packs a day for about ten years before quitting.  She reports occasional GERD symptoms for which she takes Prilosec as needed.   Ancillary information including prior medications, full medical/surgical/family/social histories, and PFTs (when available) are listed below and have been reviewed.    ROS   Objective:   Vitals:   01/08/24 0921  BP: 116/62  Pulse: (!) 52  Temp: 98.7 F (37.1 C)  SpO2: 97%  Weight: 175 lb (79.4 kg)  Height: 5' 4 (1.626 m)   97% on RA BMI Readings from Last 3 Encounters:  01/08/24 30.04 kg/m  12/12/23 30.04 kg/m  11/22/23 30.90 kg/m   Wt Readings from Last 3 Encounters:  01/08/24 175 lb (79.4 kg)  12/12/23 175 lb (79.4 kg)  11/22/23 180 lb (81.6 kg)    .vitalsmbmi  Physical Exam    Ancillary Information    Past Medical History:  Diagnosis Date   Allergy    Arthritis    Asthma    Atypical chest pain 03/14/2019   Atypical mole 06/12/2018   right prox lat thigh/mod   Atypical mole 05/19/2014   left sup med buttock/excision   Atypical mole 03/26/2014   right sup lat buttock  Atypical mole 03/26/2013   left lat mid back   Atypical mole 11/08/2006   right shoulder superior/excision   Basal cell carcinoma 06/12/2016   right medial sup canthus below eyebrow   Basal cell carcinoma 09/23/2014   left nasal bridge   Basal cell carcinoma 03/26/2013   left nasal sidewall   Basal cell carcinoma 08/27/2012   left nasal dorsum   Cancer (HCC)    shoulder, nose skin CA- basal cell CA   GERD (gastroesophageal reflux disease)    occasional   Hyperlipidemia    Osteopenia    Polyp of transverse colon    Right arm weakness 03/14/2019   Stress due to illness of family member 05/29/2018     Family History  Problem Relation Age of Onset   Lung cancer Mother    Diabetes Father    Parkinson's disease Father    Alzheimer's disease Father    Heart attack Father 74   Alcohol abuse Sister    Heart disease Sister     Lung cancer Sister 65   Down syndrome Sister    Colon cancer Paternal Uncle        d. 74s   Melanoma Maternal Grandmother    Stroke Paternal Grandmother    Lung cancer Paternal Grandmother        d. 61s   Stomach cancer Paternal Grandfather        d. 69s   Liver cancer Son 56   Esophageal cancer Neg Hx    Breast cancer Neg Hx      Past Surgical History:  Procedure Laterality Date   BILATERAL SALPINGO-OOPHORECTOMY AND OMENTECTOMY; WITH RADICAL DISSECTION FOR DEBULKING  09/08/2021   BREAST BIOPSY Right    core bx- neg   COLONOSCOPY     COLONOSCOPY WITH PROPOFOL  N/A 10/08/2018   Procedure: COLONOSCOPY WITH PROPOFOL ;  Surgeon: Jinny Carmine, MD;  Location: [facility] ENDOSCOPY;  Service: Endoscopy;  Laterality: N/A;   LEFT HEART CATH AND CORONARY ANGIOGRAPHY Left 11/20/2023   Procedure: LEFT HEART CATH AND CORONARY ANGIOGRAPHY;  Surgeon: Mady Bruckner, MD;  Location: [facility];  Service: Cardiovascular;  Laterality: Left;   MOHS SURGERY     TONSILLECTOMY     VAGINAL HYSTERECTOMY      Social History   Socioeconomic History   Marital status: Married    Spouse name: [name]   Number of children: 2   Years of education: Not on file   Highest education level: Bachelor's degree (e.g., BA, AB, BS)  Occupational History   Occupation: Retired  Tobacco Use   Smoking status: Former    Current packs/day: 0.00    Average packs/day: 1.5 packs/day for 20.0 years (30.0 ttl pk-yrs)    Types: Cigarettes    Start date: 16    Quit date: 1993    Years since quitting: 32.9    Passive exposure: Past   Smokeless tobacco: Never  Vaping Use   Vaping status: Never Used  Substance and Sexual Activity   Alcohol use: Yes    Alcohol/week: 1.0 standard drink of alcohol    Types: 1 Glasses of wine per week    Comment: one glass of wine once a month   Drug use: Never   Sexual activity: Not Currently  Other Topics Concern   Not on file  Social History Narrative   Lives with husband in  [place]; quit smoking > 30 years; once every 1-2 months; retd. Teacher- elementary school;    Social Drivers of Health  Financial Resource Strain: Low Risk  (05/09/2022)   Overall Financial Resource Strain (CARDIA)    Difficulty of Paying Living Expenses: Not hard at all  Food Insecurity: No Food Insecurity (05/09/2022)   Hunger Vital Sign    Worried About Running Out of Food in the Last Year: Never true    Ran Out of Food in the Last Year: Never true  Transportation Needs: No Transportation Needs (05/09/2022)   PRAPARE - Administrator, Civil Service (Medical): No    Lack of Transportation (Non-Medical): No  Physical Activity: Insufficiently Active (05/09/2022)   Exercise Vital Sign    Days of Exercise per Week: 3 days    Minutes of Exercise per Session: 20 min  Stress: Stress Concern Present (05/09/2022)   [employer] of Occupational Health - Occupational Stress Questionnaire    Feeling of Stress : To some extent  Social Connections: Socially Integrated (05/09/2022)   Social Connection and Isolation Panel    Frequency of Communication with Friends and Family: Three times a week    Frequency of Social Gatherings with Friends and Family: Three times a week    Attends Religious Services: More than 4 times per year    Active Member of Clubs or Organizations: Yes    Attends Banker Meetings: More than 4 times per year    Marital Status: Married  Catering Manager Violence: Not At Risk (03/31/2022)   Humiliation, Afraid, Rape, and Kick questionnaire    Fear of Current or Ex-Partner: No    Emotionally Abused: No    Physically Abused: No    Sexually Abused: No     Allergies  Allergen Reactions   Tetanus Toxoid-Containing Vaccines Shortness Of Breath   Ranexa  [Ranolazine ] Other (See Comments)    Probable orthostatic symptoms.  Near syncope   Sulfa Antibiotics Nausea And Vomiting    Severe vomiting     CBC    Component Value Date/Time   WBC 15.9 (H)  11/22/2023 1129   RBC 4.36 11/22/2023 1129   HGB 14.1 11/22/2023 1129   HCT 42.0 11/22/2023 1129   PLT 160 11/22/2023 1129   MCV 96.3 11/22/2023 1129   MCH 32.3 11/22/2023 1129   MCHC 33.6 11/22/2023 1129   RDW 13.2 11/22/2023 1129   LYMPHSABS 5.2 (H) 05/03/2022 1008   MONOABS 0.3 05/03/2022 1008   EOSABS 370 11/05/2023 1105   BASOSABS 77 11/05/2023 1105    Pulmonary Functions Testing Results:     No data to display          Outpatient Medications Prior to Visit  Medication Sig Dispense Refill   acetaminophen  (TYLENOL ) 325 MG tablet Take 650 mg by mouth every 6 (six) hours as needed for moderate pain (pain score 4-6).     albuterol  (VENTOLIN  HFA) 108 (90 Base) MCG/ACT inhaler Inhale 2 puffs into the lungs every 6 (six) hours as needed for wheezing or shortness of breath. 8 g 0   Alpha Lipoic Acid 200 MG CAPS Take 200 mg by mouth daily.     aspirin  EC 81 MG tablet Take 1 tablet (81 mg total) by mouth daily. Swallow whole.     atorvastatin  (LIPITOR) 40 MG tablet Take 1 tablet (40 mg total) by mouth daily. 90 tablet 3   escitalopram  (LEXAPRO ) 20 MG tablet Take 1 tablet (20 mg total) by mouth daily. 90 tablet 1   hydrOXYzine  (ATARAX ) 25 MG tablet Take 1 tablet (25 mg total) by mouth every 8 (eight) hours as  needed for anxiety (insomnia). 90 tablet 1   melatonin 3 MG TABS tablet Take 3 mg by mouth at bedtime as needed (sleep).     metoprolol  succinate (TOPROL -XL) 25 MG 24 hr tablet TAKE 1 TABLET (25 MG TOTAL) BY MOUTH AS NEEDED (FOR PALPITATIONS). 90 tablet 3   traZODone  (DESYREL ) 50 MG tablet Take 100 mg by mouth at bedtime as needed.     vitamin B-12 (CYANOCOBALAMIN ) 1000 MCG tablet Take 1,000 mcg by mouth daily.     Vitamin D , Cholecalciferol, 10 MCG (400 UNIT) CAPS Take 400 Units by mouth daily.     isosorbide  mononitrate (IMDUR ) 30 MG 24 hr tablet Take 0.5 tablets (15 mg total) by mouth daily. (Patient not taking: Reported on 01/08/2024) 45 tablet 3   No facility-administered  medications prior to visit.

## 2024-01-18 ENCOUNTER — Ambulatory Visit: Admitting: Nurse Practitioner

## 2024-01-18 ENCOUNTER — Encounter: Payer: Self-pay | Admitting: Nurse Practitioner

## 2024-01-18 VITALS — BP 138/82 | HR 59 | Temp 97.6°F | Ht 64.0 in

## 2024-01-18 DIAGNOSIS — F320 Major depressive disorder, single episode, mild: Secondary | ICD-10-CM

## 2024-01-18 DIAGNOSIS — F411 Generalized anxiety disorder: Secondary | ICD-10-CM

## 2024-01-18 MED ORDER — ESCITALOPRAM OXALATE 20 MG PO TABS: 20.0000 mg | ORAL_TABLET | Freq: Every day | ORAL | 1 refills | Status: AC

## 2024-01-18 MED ORDER — BUSPIRONE HCL 5 MG PO TABS: 5.0000 mg | ORAL_TABLET | Freq: Three times a day (TID) | ORAL | 3 refills | Status: AC | PRN

## 2024-01-18 NOTE — Progress Notes (Signed)
 BP 138/82   Pulse (!) 59   Temp 97.6 F (36.4 C)   Ht 5' 4 (1.626 m)   LMP  (LMP Unknown)   SpO2 97%   BMI 30.04 kg/m    Subjective:    Patient ID: [name], female    DOB: 1945/06/23, 78 y.o.   MRN: [id number]  HPI: [name] is a 78 y.o. female  Chief Complaint  Patient presents with   Anxiety   Discussed the use of AI scribe software for clinical note transcription with the patient, who gave verbal consent to proceed.  History of Present Illness [name] is a 78 year old female who presents with caregiver stress and anxiety. She is accompanied by her husband, for whom she is the primary caregiver.  Caregiver stress and anxiety - Significant stress and anxiety related to role as primary caregiver for husband - Alvis 'worn out' and 'running out of steam and patience' - Responsibilities include managing husband's medications, attending doctor appointments, and providing reminders - Husband requires assistance due to fused leg (uses cane) and frequent misplacement of hearing aids, but is able to manage bodily functions and dress himself - Stress and anxiety attributed to ongoing caregiving demands  Psychotropic medication use and response - Currently taking Lexapro  (escitalopram ) without significant improvement in symptoms - Previously used Ativan  for anxiety during travel - Occasionally takes hydroxyzine , which sometimes causes drowsiness - Prescribed buspirone  earlier in the year but did not continue          01/18/2024   11:30 AM 08/20/2023    9:02 AM 02/20/2023    9:16 AM  Depression screen PHQ 2/9  Decreased Interest 1 0 0  Down, Depressed, Hopeless 2 0 0  PHQ - 2 Score 3 0 0  Altered sleeping 3 1 1   Tired, decreased energy 2 1 1   Change in appetite 2 0 0  Feeling bad or failure about yourself  2 0 0  Trouble concentrating 0 0 0  Moving slowly or fidgety/restless 0 0 0  Suicidal thoughts 0 0 0  PHQ-9 Score 12 2  2    Difficult doing  work/chores Somewhat difficult       Data saved with a previous flowsheet row definition       01/18/2024   11:30 AM 08/20/2023    9:03 AM 02/20/2023    9:16 AM 01/09/2023   10:02 AM  GAD 7 : Generalized Anxiety Score  Nervous, Anxious, on Edge 3 1 1 2   Control/stop worrying 3 1 1 2   Worry too much - different things 3 0 0 2  Trouble relaxing 3 0 0 2  Restless 2 0 0 2  Easily annoyed or irritable 2 1 1  0  Afraid - awful might happen 1 0 0 2  Total GAD 7 Score 17 3 3 12   Anxiety Difficulty Somewhat difficult   Somewhat difficult     Relevant past medical, surgical, family and social history reviewed and updated as indicated. Interim medical history since our last visit reviewed. Allergies and medications reviewed and updated.  Review of Systems  Ten systems reviewed and is negative except as mentioned in HPI      Objective:      BP 138/82   Pulse (!) 59   Temp 97.6 F (36.4 C)   Ht 5' 4 (1.626 m)   LMP  (LMP Unknown)   SpO2 97%   BMI 30.04 kg/m    Wt Readings from  Last 3 Encounters:  01/08/24 175 lb (79.4 kg)  12/12/23 175 lb (79.4 kg)  11/22/23 180 lb (81.6 kg)    Physical Exam GENERAL: Alert, cooperative, well developed, no acute distress HEENT: Normocephalic, normal oropharynx, moist mucous membranes CHEST: Clear to auscultation bilaterally, No wheezes, rhonchi, or crackles CARDIOVASCULAR: Normal heart rate and rhythm, S1 and S2 normal without murmurs ABDOMEN: Soft, non-tender, non-distended, without organomegaly, Normal bowel sounds EXTREMITIES: No cyanosis or edema NEUROLOGICAL: Cranial nerves grossly intact, Moves all extremities without gross motor or sensory deficit  Results for orders placed or performed in visit on 01/08/24  Nitric oxide    Collection Time: 01/08/24  9:26 AM  Result Value Ref Range   Nitric Oxide  20           Assessment & Plan:   Problem List Items Addressed This Visit       Other   Current mild episode of major depressive  disorder without prior episode   Relevant Medications   busPIRone  (BUSPAR ) 5 MG tablet   escitalopram  (LEXAPRO ) 20 MG tablet   GAD (generalized anxiety disorder) - Primary   Relevant Medications   busPIRone  (BUSPAR ) 5 MG tablet   escitalopram  (LEXAPRO ) 20 MG tablet     Assessment and Plan Assessment & Plan Generalized anxiety disorder Chronic anxiety exacerbated by caregiving responsibilities. Previous trial of buspirone  was not continued, but no adverse effects were noted. Current management with escitalopram  is not perceived as effective. - Prescribed buspirone  as needed, up to three times a day, based on anxiety levels. - Continue escitalopram  for baseline anxiety management. - Follow up in four weeks to assess effectiveness of buspirone .  Depressive disorder Chronic depressive symptoms with current treatment of escitalopram . No significant improvement reported.        Follow up plan: Return in about 4 weeks (around 02/15/2024) for follow up.

## 2024-01-21 ENCOUNTER — Ambulatory Visit
Admission: RE | Admit: 2024-01-21 | Discharge: 2024-01-21 | Disposition: A | Discharge status: Home / Self Care | Source: Ambulatory Visit | Attending: Student in an Organized Health Care Education/Training Program | Admitting: Student in an Organized Health Care Education/Training Program

## 2024-01-21 ENCOUNTER — Ambulatory Visit
Admission: RE | Admit: 2024-01-21 | Discharge: 2024-01-21 | Disposition: A | Source: Ambulatory Visit | Attending: Student in an Organized Health Care Education/Training Program

## 2024-01-21 DIAGNOSIS — R0602 Shortness of breath: Secondary | ICD-10-CM

## 2024-01-21 DIAGNOSIS — Z0389 Encounter for observation for other suspected diseases and conditions ruled out: Secondary | ICD-10-CM | POA: Diagnosis not present

## 2024-01-21 DIAGNOSIS — I700 Atherosclerosis of aorta: Secondary | ICD-10-CM | POA: Diagnosis not present

## 2024-01-21 MED ORDER — TECHNETIUM TO 99M ALBUMIN AGGREGATED
4.2500 | Freq: Once | INTRAVENOUS | Status: AC | PRN
  Administered 2024-01-21: 4.25 via INTRAVENOUS

## 2024-02-04 ENCOUNTER — Ambulatory Visit: Admitting: Nurse Practitioner

## 2024-02-09 ENCOUNTER — Other Ambulatory Visit: Payer: Self-pay | Admitting: Nurse Practitioner

## 2024-02-09 DIAGNOSIS — F411 Generalized anxiety disorder: Secondary | ICD-10-CM

## 2024-02-11 NOTE — Progress Notes (Unsigned)
 "  Cardiology Office Note    Date:  02/11/2024   ID:  [name], [name] 1945/08/15, MRN [id number]  PCP:  Gareth Mliss FALCON, FNP  Cardiologist:  Lonni Hanson, MD  Electrophysiologist:  None   Chief Complaint: ***  History of Present Illness:   [name] is a 78 y.o. female with history of SVT, hyperlipidemia, asthma, depression, GAD, colonic polyps, and mild nonobstructive CAD who presents for ***.    Patient was initially evaluated by Dr. Hanson as a new patient 02/2019 for palpitations in the setting of increased stress.  There is a remote isolated episode of syncope while donating blood many years prior to that.  She reported a stress test approximately 20 years ago as part of a CPE that she believed to be normal.  Zio patch 02/2019 showed predominant rhythm of sinus with average heart rate 64 bpm, rare PACs and PVCs.  There were 20 atrial runs lasting up to 12 seconds with a maximum heart rate of 184 bpm.  There was no sustained arrhythmias or prolonged pauses.  Patient triggered events corresponded to sinus rhythm.  Echo 04/2019 showed EF of 55 to 60%, no RWMA, and trivial MR.  She was seen in follow-up 04/2019 continuing to note episodic chest discomfort that was described as a pinch and was randomly occurring lasting for several seconds in duration with spontaneous resolution.  In this setting, patient underwent Lexiscan  MPI in 05/2019 which showed no significant ischemia with CT attenuation corrected images showing mild aortic atherosclerosis and no significant coronary artery calcification.  Overall, this was a low risk study.  Her tachypalpitations have been managed with as needed Lopressor  12.5 mg twice daily as needed.    She was seen in follow-up 01/2023 reporting shortness of breath and dyspnea on exertion which she attributed to a side effect of her chemotherapy for ovarian cancer.  Echo 01/2023 showed EF 60 to 65%, G1 DD, mild MR, and aortic sclerosis without evidence of  stenosis.  She was seen by her PCP 11/05/2023 reporting 6 months of worsening shortness of breath, dyspnea on exertion, and palpitations.  Labs were reassuring including TSH, BNP, potassium, and magnesium.  Patient was placed on long-term monitor which revealed predominantly sinus rhythm with 23 episodes of NSVT lasting up to 12 seconds with an average heart rate of 131 with otherwise no sustained arrhythmias and no evidence of atrial fibrillation.  Patient was seen 11/13/2023 reporting 6 months of progressive symptoms of exertional chest pain associated with palpitations and shortness of breath. She reported chest pressure/heaviness with exertion for the past 6 months which initially only came on with heavier exertion or walking long distances but recently she had been experiencing with very little activity. New T wave inversions were noted on EKG. Left heart catheterization 11/20/2023 revealed mild nonobstructive CAD with 10 to 20% eccentric plaquing of the proximal LAD and otherwise no angiographically significant CAD. Upper normal LVEDP 15 mmHg and normal LV systolic function. She was started on ranolazine  500 mg twice daily for possible microvascular dysfunction.   She was evaluated in the emergency department 11/22/2023 for lightheadedness. She reported starting newly prescribed Ranexa  with lightheadedness for the remainder of the day. She woke up the following morning and took her morning medications. She was driving with her friend when she started to feel lightheaded and faint as though she may pass out. She got nauseated and vomited once. She called our office who recommended being evaluated in the emergency department. In  the ED, BP 141/65 and heart rate 58 bpm with otherwise normal vital signs. WBC mildly elevated at 15.9 which is chronic for. Creatinine 1.07 with otherwise reassuring labs. Troponin negative x 2. EKG without ischemic changes but there was concern for QT prolongation. Case was discussed  with cardiology who recommended discontinuing Ranexa  which was likely the cause for her symptoms and QT prolongation.   Patient was most recently seen by myself 12/12/2023 with ongoing symptoms of exertional chest pressure, shortness of breath, and palpitations.  The results of her left heart catheterization and heart monitor.  He reported to doing fairly active and reported symptoms typically onset when she initiates activity quickly or gets excited.  She denied any further lightheadedness and nausea since discontinuing Ranexa .  Lovastatin  was discontinued.  She was started on aspirin  81 mg daily, Imdur  50 mg daily, and atorvastatin  40 mg daily.  She was referred to pulmonology for evaluation of dyspnea on exertion.  ***  Labs independently reviewed: 11/2023-Hgb 14.1, HCT 42.0, platelets 160, potassium 4.3, BUN 16, creatinine 1.07, sodium 139 04/2022-TC 182, HDL 48, TG 358, LDL 86  Objective   Past Medical History:  Diagnosis Date   Allergy 1949   Arthritis 2010   Asthma All life   Atypical chest pain 03/14/2019   Atypical mole 06/12/2018   right prox lat thigh/mod   Atypical mole 05/19/2014   left sup med buttock/excision   Atypical mole 03/26/2014   right sup lat buttock   Atypical mole 03/26/2013   left lat mid back   Atypical mole 11/08/2006   right shoulder superior/excision   Basal cell carcinoma 06/12/2016   right medial sup canthus below eyebrow   Basal cell carcinoma 09/23/2014   left nasal bridge   Basal cell carcinoma 03/26/2013   left nasal sidewall   Basal cell carcinoma 08/27/2012   left nasal dorsum   Cancer (HCC)    shoulder, nose skin CA- basal cell CA   GERD (gastroesophageal reflux disease)    occasional   Hyperlipidemia    Osteopenia    Polyp of transverse colon    Right arm weakness 03/14/2019   Stress due to illness of family member 05/29/2018    Current Medications: Active Medications[1]  Allergies:   Tetanus toxoid-containing vaccines,  Ranexa  [ranolazine ], and Sulfa antibiotics   Social History   Socioeconomic History   Marital status: Married    Spouse name: [name]   Number of children: 2   Years of education: Not on file   Highest education level: Bachelor's degree (e.g., BA, AB, BS)  Occupational History   Occupation: Retired  Tobacco Use   Smoking status: Former    Current packs/day: 0.00    Average packs/day: 1.5 packs/day for 20.0 years (30.0 ttl pk-yrs)    Types: Cigarettes    Start date: 26    Quit date: 1993    Years since quitting: 33.0    Passive exposure: Past   Smokeless tobacco: Never  Vaping Use   Vaping status: Never Used  Substance and Sexual Activity   Alcohol use: Yes    Alcohol/week: 1.0 standard drink of alcohol    Types: 1 Glasses of wine per week    Comment: one glass of wine once a month   Drug use: Never   Sexual activity: Not Currently  Other Topics Concern   Not on file  Social History Narrative   Lives with husband in [place]; quit smoking > 30 years; once every 1-2 months; retd. Teacher-  elementary school;    Social Drivers of Health   Tobacco Use: Medium Risk (01/18/2024)   Patient History    Smoking Tobacco Use: Former    Smokeless Tobacco Use: Never    Passive Exposure: Past  Physicist, Medical Strain: Low Risk (05/09/2022)   Overall Financial Resource Strain (CARDIA)    Difficulty of Paying Living Expenses: Not hard at all  Food Insecurity: No Food Insecurity (05/09/2022)   Hunger Vital Sign    Worried About Running Out of Food in the Last Year: Never true    Ran Out of Food in the Last Year: Never true  Transportation Needs: No Transportation Needs (05/09/2022)   PRAPARE - Administrator, Civil Service (Medical): No    Lack of Transportation (Non-Medical): No  Physical Activity: Insufficiently Active (05/09/2022)   Exercise Vital Sign    Days of Exercise per Week: 3 days    Minutes of Exercise per Session: 20 min  Stress: Stress Concern Present  (05/09/2022)   [employer] of Occupational Health - Occupational Stress Questionnaire    Feeling of Stress : To some extent  Social Connections: Socially Integrated (05/09/2022)   Social Connection and Isolation Panel    Frequency of Communication with Friends and Family: Three times a week    Frequency of Social Gatherings with Friends and Family: Three times a week    Attends Religious Services: More than 4 times per year    Active Member of Clubs or Organizations: Yes    Attends Banker Meetings: More than 4 times per year    Marital Status: Married  Depression (PHQ2-9): High Risk (01/18/2024)   Depression (PHQ2-9)    PHQ-2 Score: 12  Alcohol Screen: Low Risk (05/09/2022)   Alcohol Screen    Last Alcohol Screening Score (AUDIT): 1  Housing: Unknown (05/04/2023)   Received from [facility]   Epic    Unable to Pay for Housing in the Last Year: Not on file    Number of Times Moved in the Last Year: Not on file    At any time in the past 12 months, were you homeless or living in a shelter (including now)?: No  Utilities: Not on file  Health Literacy: Not on file     Family History:  The patient's family history includes Alcohol abuse in her sister; Alzheimer's disease in her father; Cancer in her paternal grandfather, paternal uncle, sister, and son; Colon cancer in her paternal uncle; Diabetes in her father; Down syndrome in her sister; Heart attack (age of onset: 66) in her father; Heart disease in her sister; Intellectual disability in her sister; Liver cancer (age of onset: 41) in her son; Lung cancer in her mother and paternal grandmother; Lung cancer (age of onset: 68) in her sister; Melanoma in her maternal grandmother; Parkinson's disease in her father; Stomach cancer in her paternal grandfather; Stroke in her father and paternal grandmother. There is no history of Esophageal cancer or Breast cancer.  ROS:   12-point review of systems is negative  unless otherwise noted in the HPI.  EKGs/Other Studies Reviewed:    Studies reviewed were summarized above. The additional studies were reviewed today:  11/2023 Long term monitor 23 nonsustained SVT, longest 12 seconds with an average rate of 131 bpm. Rare supraventricular and ventricular ectopy. No sustained arrhythmias. No atrial fibrillation  11/2023 LHC Mild, non-obstructive coronary artery disease with 10-20% eccentric plaquing of the proximal LAD.  Otherwise, no angiographically significant coronary  artery disease. Upper normal left ventricular filling pressure (LVEDP 15 mmHg). Normal left ventricular systolic function (LVEF 55-65%).  EKG:  EKG personally reviewed by me today    PHYSICAL EXAM:    VS:  LMP  (LMP Unknown)   BMI: There is no height or weight on file to calculate BMI.  GEN: Well nourished, well developed in no acute distress NECK: No JVD; No carotid bruits CARDIAC: ***RRR, no murmurs, rubs, gallops RESPIRATORY:  Clear to auscultation without rales, wheezing or rhonchi  ABDOMEN: Soft, non-tender, non-distended EXTREMITIES:  *** No edema; No deformity  Wt Readings from Last 3 Encounters:  01/08/24 175 lb (79.4 kg)  12/12/23 175 lb (79.4 kg)  11/22/23 180 lb (81.6 kg)                  ASSESSMENT & PLAN:   Nonobstructive CAD   Dyspnea on exertion    PSVT   Hyperlipidemia    {Are you ordering a CV Procedure (e.g. stress test, cath, DCCV, TEE, etc)?   Press F2        :[id number]}   Disposition: F/u with Dr. Mady or an APP in ***.   Medication Adjustments/Labs and Tests Ordered: Current medicines are reviewed at length with the patient today.  Concerns regarding medicines are outlined above. Medication changes, Labs and Tests ordered today are summarized above and listed in the Patient Instructions accessible in Encounters.   Bonney Lesley Maffucci, PA-C 02/11/2024 4:47 PM     [facility] [place] [address] [place], [place] [address] [phone]      [1]  No outpatient medications have been marked as taking for the 02/15/24 encounter (Appointment) with Maffucci Lesley CROME, PA-C.   "

## 2024-02-12 NOTE — Telephone Encounter (Signed)
 Too soon for refill, LRF 01/18/24 for 30 and 2 refills.  Requested Prescriptions  Pending Prescriptions Disp Refills   busPIRone  (BUSPAR ) 5 MG tablet [Pharmacy Med Name: BUSPIRONE  HCL 5 MG TABLET] 270 tablet 2    Sig: TAKE 1 TABLET BY MOUTH 3 TIMES DAILY AS NEEDED.     Psychiatry: Anxiolytics/Hypnotics - Non-controlled Passed - 02/12/2024 10:51 AM      Passed - Valid encounter within last 12 months    Recent Outpatient Visits           3 weeks ago GAD (generalized anxiety disorder)   [facility] Gareth Mliss FALCON, FNP   3 months ago SOB (shortness of breath) on exertion   [facility] Leavy Mole, PA-C   5 months ago Current mild episode of major depressive disorder without prior episode   [facility] Leavy Mole, PA-C       Future Appointments             In 3 days Lorene Lesley LITTIE DEVONNA [facility]

## 2024-02-15 ENCOUNTER — Ambulatory Visit: Attending: Physician Assistant | Admitting: Physician Assistant

## 2024-02-15 ENCOUNTER — Encounter: Payer: Self-pay | Admitting: Physician Assistant

## 2024-02-15 VITALS — BP 120/68 | HR 64 | Ht 63.5 in | Wt 175.0 lb

## 2024-02-15 DIAGNOSIS — I471 Supraventricular tachycardia, unspecified: Secondary | ICD-10-CM | POA: Diagnosis not present

## 2024-02-15 DIAGNOSIS — E785 Hyperlipidemia, unspecified: Secondary | ICD-10-CM

## 2024-02-15 DIAGNOSIS — R0609 Other forms of dyspnea: Secondary | ICD-10-CM | POA: Diagnosis not present

## 2024-02-15 DIAGNOSIS — I25709 Atherosclerosis of coronary artery bypass graft(s), unspecified, with unspecified angina pectoris: Secondary | ICD-10-CM | POA: Diagnosis not present

## 2024-02-15 MED ORDER — METOPROLOL SUCCINATE ER 25 MG PO TB24: 25.0000 mg | ORAL_TABLET | Freq: Every day | ORAL | 3 refills | Status: AC

## 2024-02-15 NOTE — Patient Instructions (Signed)
 Medication Instructions:  Your physician recommends the following medication changes.  START TAKING: Metoprolol  25 mg Daily   *If you need a refill on your cardiac medications before your next appointment, please call your pharmacy*  Lab Work: No labs ordered today  If you have labs (blood work) drawn today and your tests are completely normal, you will receive your results only by: MyChart Message (if you have MyChart) OR A paper copy in the mail If you have any lab test that is abnormal or we need to change your treatment, we will call you to review the results.  Testing/Procedures: No test ordered today   Follow-Up: At [facility], you and your health needs are our priority.  As part of our continuing mission to provide you with exceptional heart care, our providers are all part of one team.  This team includes your primary Cardiologist (physician) and Advanced Practice Providers or APPs (Physician Assistants and Nurse Practitioners) who all work together to provide you with the care you need, when you need it.  Your next appointment:   6 month(s)  Provider:   You may see Lonni Hanson, MD or one of the following Advanced Practice Providers on your designated Care Team:   Lonni Meager, NP Lesley Maffucci, PA-C Bernardino Bring, PA-C Cadence Leona, PA-C Tylene Lunch, NP Barnie Hila, NP

## 2024-02-20 ENCOUNTER — Ambulatory Visit: Admitting: Family Medicine

## 2024-03-20 ENCOUNTER — Encounter: Payer: Self-pay | Admitting: Internal Medicine

## 2024-03-27 ENCOUNTER — Ambulatory Visit: Admitting: Student in an Organized Health Care Education/Training Program

## 2024-03-27 ENCOUNTER — Encounter

## 2024-03-28 ENCOUNTER — Ambulatory Visit

## 2024-04-23 ENCOUNTER — Inpatient Hospital Stay
